# Patient Record
Sex: Male | Born: 1935
Health system: Southern US, Community
[De-identification: ages and names within clinical notes are randomized; demographics above are authoritative.]

## PROBLEM LIST (undated history)

## (undated) DIAGNOSIS — I82409 Acute embolism and thrombosis of unspecified deep veins of unspecified lower extremity: Secondary | ICD-10-CM

## (undated) DIAGNOSIS — K5790 Diverticulosis of intestine, part unspecified, without perforation or abscess without bleeding: Secondary | ICD-10-CM

## (undated) DIAGNOSIS — N400 Enlarged prostate without lower urinary tract symptoms: Secondary | ICD-10-CM

## (undated) DIAGNOSIS — D472 Monoclonal gammopathy: Secondary | ICD-10-CM

## (undated) DIAGNOSIS — D509 Iron deficiency anemia, unspecified: Secondary | ICD-10-CM

## (undated) DIAGNOSIS — I10 Essential (primary) hypertension: Secondary | ICD-10-CM

## (undated) DIAGNOSIS — Q613 Polycystic kidney, unspecified: Secondary | ICD-10-CM

## (undated) DIAGNOSIS — K219 Gastro-esophageal reflux disease without esophagitis: Secondary | ICD-10-CM

## (undated) DIAGNOSIS — K4090 Unilateral inguinal hernia, without obstruction or gangrene, not specified as recurrent: Secondary | ICD-10-CM

## (undated) HISTORY — PX: HAND SURGERY: SHX662

## (undated) HISTORY — DX: Diverticulosis of intestine, part unspecified, without perforation or abscess without bleeding: K57.90

## (undated) HISTORY — DX: Polycystic kidney, unspecified: Q61.3

## (undated) HISTORY — PX: LUNG SURGERY: SHX703

## (undated) HISTORY — DX: Iron deficiency anemia, unspecified: D50.9

## (undated) HISTORY — DX: Unilateral inguinal hernia, without obstruction or gangrene, not specified as recurrent: K40.90

## (undated) HISTORY — DX: Gastro-esophageal reflux disease without esophagitis: K21.9

## (undated) HISTORY — DX: Acute embolism and thrombosis of unspecified deep veins of unspecified lower extremity: I82.409

## (undated) HISTORY — DX: Benign prostatic hyperplasia without lower urinary tract symptoms: N40.0

## (undated) HISTORY — DX: Monoclonal gammopathy: D47.2

---

## 2004-11-28 ENCOUNTER — Ambulatory Visit: Payer: Self-pay | Admitting: Internal Medicine

## 2005-03-19 ENCOUNTER — Ambulatory Visit: Payer: Self-pay | Admitting: Internal Medicine

## 2005-12-11 ENCOUNTER — Ambulatory Visit: Payer: Self-pay | Admitting: Internal Medicine

## 2007-06-23 DIAGNOSIS — D509 Iron deficiency anemia, unspecified: Secondary | ICD-10-CM

## 2007-06-23 DIAGNOSIS — N400 Enlarged prostate without lower urinary tract symptoms: Secondary | ICD-10-CM

## 2007-06-23 DIAGNOSIS — K219 Gastro-esophageal reflux disease without esophagitis: Secondary | ICD-10-CM

## 2007-06-23 HISTORY — DX: Iron deficiency anemia, unspecified: D50.9

## 2007-06-23 HISTORY — DX: Benign prostatic hyperplasia without lower urinary tract symptoms: N40.0

## 2007-06-23 HISTORY — DX: Gastro-esophageal reflux disease without esophagitis: K21.9

## 2007-06-24 ENCOUNTER — Ambulatory Visit: Payer: Self-pay | Admitting: Internal Medicine

## 2007-09-03 ENCOUNTER — Ambulatory Visit: Payer: Self-pay | Admitting: Internal Medicine

## 2008-08-11 ENCOUNTER — Ambulatory Visit: Payer: Self-pay | Admitting: Internal Medicine

## 2009-06-15 ENCOUNTER — Emergency Department (HOSPITAL_BASED_OUTPATIENT_CLINIC_OR_DEPARTMENT_OTHER): Admission: EM | Admit: 2009-06-15 | Discharge: 2009-06-15 | Payer: Self-pay | Admitting: Emergency Medicine

## 2009-06-15 ENCOUNTER — Ambulatory Visit: Payer: Self-pay | Admitting: Diagnostic Radiology

## 2009-09-13 ENCOUNTER — Ambulatory Visit: Payer: Self-pay | Admitting: Internal Medicine

## 2009-11-09 ENCOUNTER — Ambulatory Visit: Payer: Self-pay | Admitting: Internal Medicine

## 2010-08-24 ENCOUNTER — Ambulatory Visit: Payer: Self-pay | Admitting: Internal Medicine

## 2010-12-02 LAB — CONVERTED CEMR LAB
Albumin: 3.3 g/dL — ABNORMAL LOW (ref 3.5–5.2)
BUN: 11 mg/dL (ref 6–23)
Basophils Relative: 0.7 % (ref 0.0–3.0)
Bilirubin, Direct: 0 mg/dL (ref 0.0–0.3)
Bilirubin, Direct: 0.1 mg/dL (ref 0.0–0.3)
CO2: 28 meq/L (ref 19–32)
Chloride: 106 meq/L (ref 96–112)
Cholesterol: 179 mg/dL (ref 0–200)
Creatinine, Ser: 1.4 mg/dL (ref 0.4–1.5)
Eosinophils Absolute: 0.1 10*3/uL (ref 0.0–0.6)
Eosinophils Absolute: 0.1 10*3/uL (ref 0.0–0.7)
Eosinophils Relative: 1.5 % (ref 0.0–5.0)
GFR calc Af Amer: 77 mL/min
GFR calc non Af Amer: 64 mL/min
Glucose, Bld: 95 mg/dL (ref 70–99)
HCT: 42.4 % (ref 39.0–52.0)
HCT: 43.2 % (ref 39.0–52.0)
Lymphocytes Relative: 22.6 % (ref 12.0–46.0)
Lymphs Abs: 1.2 10*3/uL (ref 0.7–4.0)
MCHC: 32.9 g/dL (ref 30.0–36.0)
MCV: 92.1 fL (ref 78.0–100.0)
MCV: 96.2 fL (ref 78.0–100.0)
Monocytes Absolute: 0.4 10*3/uL (ref 0.1–1.0)
Monocytes Absolute: 0.5 10*3/uL (ref 0.2–0.7)
Neutro Abs: 3.4 10*3/uL (ref 1.4–7.7)
Neutro Abs: 3.7 10*3/uL (ref 1.4–7.7)
Neutrophils Relative %: 65.5 % (ref 43.0–77.0)
Neutrophils Relative %: 67.1 % (ref 43.0–77.0)
PSA: 2.27 ng/mL (ref 0.10–4.00)
PSA: 2.78 ng/mL (ref 0.10–4.00)
Potassium: 4.5 meq/L (ref 3.5–5.1)
RBC: 4.49 M/uL (ref 4.22–5.81)
Sodium: 142 meq/L (ref 135–145)
TSH: 1.95 microintl units/mL (ref 0.35–5.50)
Total CHOL/HDL Ratio: 6
Total Protein: 7.4 g/dL (ref 6.0–8.3)
Triglycerides: 94 mg/dL (ref 0.0–149.0)
WBC: 5.6 10*3/uL (ref 4.5–10.5)

## 2010-12-04 NOTE — Assessment & Plan Note (Signed)
Summary: FLU SHOT/NJR  Nurse Visit   Allergies: 1)  ! Codeine Phosphate (Codeine Phosphate)  Review of Systems       Flu Vaccine Consent Questions     Do you have a history of severe allergic reactions to this vaccine? no    Any prior history of allergic reactions to egg and/or gelatin? no    Do you have a sensitivity to the preservative Thimersol? no    Do you have a past history of Guillan-Barre Syndrome? no    Do you currently have an acute febrile illness? no    Have you ever had a severe reaction to latex? no    Vaccine information given and explained to patient? yes    Are you currently pregnant? no    Lot Number:[id number]   Exp Date:05/04/2011   Site Given  Left Deltoid IM    Orders Added: 1)  Flu Vaccine 54yrs + MEDICARE PATIENTS [Q2039] 2)  Administration Flu vaccine - MCR [G0008]

## 2010-12-04 NOTE — Assessment & Plan Note (Signed)
Summary: emp/pt fasitng/cjr   Vital Signs:  Patient profile:   75 year old male Weight:      197 pounds BMI:     27.19 BP sitting:   148 / 84  (left arm) Cuff size:   regular  Vitals Entered By: Raechel Ache, RN (November 09, 2009 9:33 AM) CC: OV, fasting. Is Patient Diabetic? No   CC:  OV and fasting.Marland Kitchen  History of Present Illness: 75 year old patient who has a history of gastroesophageal reflux disease is seen today for a comprehensive evaluation.  He also has a history of BPH.  His brother has been diagnosed with prostate cancer.  In 2003.  He underwent GI evaluation for iron deficiency anemia.  He is doing quite well today.  He continues to work part-time.  Remains active and has no cardiopulmonary complaints.  His dyspepsia is well controlled with p.r.n. protonix  Problems Prior to Update: 1)  Benign Prostatic Hypertrophy  (ICD-600.00) 2)  Gerd  (ICD-530.81) 3)  Anemia-iron Deficiency  (ICD-280.9)  Medications Prior to Update: 1)  Protonix 40 Mg  Tbec (Pantoprazole Sodium) .Marland Kitchen.. 1 Once Daily  Allergies: 1)  ! Codeine Phosphate (Codeine Phosphate)  Past History:  Past Medical History: Reviewed history from 06/23/2007 and no changes required. Anemia-iron deficiency GERD Benign prostatic hypertrophy  Past Surgical History: Hand surgery 1974 history of spontaneous pneumothorax in 1957  2003 Colonoscopy and EGD   Family History: Reviewed history from 06/24/2007 and no changes required. father died att 53 from an MI.  Mother lived into her 42s one brother had a stroke , prostate ca  Social History: Reviewed history from 06/24/2007 and no changes required. 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    Physical Exam  General:  Well-developed,well-nourished,in no acute distress; alert,appropriate and cooperative throughout examination; 140/90 Head:  Normocephalic and atraumatic without obvious abnormalities. No apparent alopecia or balding. Eyes:  No corneal or conjunctival inflammation noted. EOMI. Perrla. Funduscopic exam benign, without hemorrhages, exudates or papilledema. Vision grossly normal. Ears:  External ear exam shows no significant lesions or deformities.  Otoscopic examination reveals clear canals, tympanic membranes are intact bilaterally without bulging, retraction, inflammation or discharge. Hearing is grossly normal bilaterally. Nose:  External nasal examination shows no deformity or inflammation. Nasal mucosa are pink and moist without lesions or exudates. Mouth:  Oral mucosa and oropharynx without lesions or exudates.  Teeth in good repair. Neck:  No deformities, masses, or tenderness noted. Chest Wall:  No deformities, masses, tenderness or gynecomastia noted. Breasts:  No masses or gynecomastia noted Lungs:  Normal respiratory effort, chest expands symmetrically. Lungs are clear to auscultation, no crackles or wheezes. Heart:  Normal rate and regular rhythm. S1 and S2 normal without gallop, murmur, click, rub or other extra sounds. Abdomen:  Bowel sounds positive,abdomen soft and non-tender without masses, organomegaly or hernias noted. Rectal:  No external abnormalities noted. Normal sphincter tone. No rectal masses or tenderness. Genitalia:  Testes bilaterally descended without nodularity, tenderness or masses. No scrotal masses or lesions. No penis lesions or urethral  discharge. Prostate:  3+ enlarged.  3+ enlarged.   Msk:  No deformity or scoliosis noted of thoracic or lumbar spine.   Pulses:  R and L carotid,radial,femoral,dorsalis pedis and posterior tibial pulses are full and equal bilaterally Extremities:  No clubbing, cyanosis, edema, or deformity noted with normal full range of motion of all joints.   Neurologic:  alert & oriented X3, cranial nerves II-XII intact, sensation intact to light touch, sensation intact to pinprick, gait normal, and DTRs symmetrical and normal.  alert & oriented X3, cranial nerves II-XII intact, sensation intact to light touch, sensation intact to pinprick, gait normal, and DTRs symmetrical and normal.   Skin:  Intact without suspicious lesions or rashes Cervical Nodes:  No lymphadenopathy noted Axillary Nodes:  No palpable lymphadenopathy Inguinal Nodes:  No significant adenopathy Psych:  Cognition and judgment appear intact. Alert and cooperative with normal attention span and concentration. No apparent delusions, illusions, hallucinations   Impression & Recommendations:  Problem # 1:  BENIGN PROSTATIC HYPERTROPHY (ICD-600.00)  Orders: Venipuncture (16109) TLB-Lipid Panel (80061-LIPID) TLB-BMP (Basic Metabolic Panel-BMET) (80048-METABOL) TLB-CBC Platelet - w/Differential (85025-CBCD) TLB-Hepatic/Liver Function Pnl (80076-HEPATIC) TLB-TSH (Thyroid Stimulating Hormone) (84443-TSH) TLB-PSA (Prostate Specific Antigen) (84153-PSA)  Problem # 2:  GERD (ICD-530.81)  His updated medication list for this problem includes:    Protonix 40 Mg Tbec (Pantoprazole sodium) .Marland Kitchen... 1 once daily    His updated medication list for this problem includes:    Protonix 40 Mg Tbec (Pantoprazole sodium) .Marland Kitchen... 1 once daily  Orders: Venipuncture (60454) TLB-Lipid Panel (80061-LIPID) TLB-BMP (Basic Metabolic Panel-BMET) (80048-METABOL) TLB-Hepatic/Liver Function Pnl (80076-HEPATIC) TLB-TSH (Thyroid Stimulating Hormone)  (84443-TSH)  Problem # 3:  ANEMIA-IRON DEFICIENCY (ICD-280.9)  Orders: Venipuncture (09811) TLB-Lipid Panel (80061-LIPID) TLB-BMP (Basic Metabolic Panel-BMET) (80048-METABOL) TLB-Hepatic/Liver Function Pnl (80076-HEPATIC) TLB-TSH (Thyroid Stimulating Hormone) (84443-TSH)  Complete Medication List: 1)  Protonix 40 Mg Tbec (Pantoprazole sodium) .Marland Kitchen.. 1 once daily  Other Orders: EKG w/ Interpretation (93000) Pneumococcal Vaccine (91478) Admin 1st Vaccine (29562) DRE (G0102)  Patient Instructions: 1)  Limit your Sodium (Salt) to less than 2 grams a day(slightly less than 1/2 a teaspoon) to prevent fluid retention, swelling, or worsening of symptoms. 2)  It is important that you exercise regularly at least 20 minutes 5 times a week. If you develop chest pain, have severe difficulty breathing, or feel very tired , stop exercising immediately and seek medical attention. 3)  Check your Blood Pressure regularly. If it is above: 140/90 you should make an appointment. Prescriptions: PROTONIX 40 MG  TBEC (PANTOPRAZOLE SODIUM) 1 once daily  #90 x 6   Entered and Authorized by:   Gordy Savers  MD   Signed by:   Gordy Savers  MD on 11/09/2009   Method used:   Print then Give to Patient   RxID:   [id number]    Immunizations Administered:  Pneumonia Vaccine:    Vaccine Type: Pneumovax    Site: left deltoid    Mfr: Merck    Dose: 0.5 ml    Route: IM    Given by: Raechel Ache, RN    Exp. Date: 10/14/2010    Lot #: [id number]    VIS given: 06/01/96 version given November 09, 2009.

## 2010-12-31 ENCOUNTER — Ambulatory Visit (INDEPENDENT_AMBULATORY_CARE_PROVIDER_SITE_OTHER): Payer: Medicare Other | Admitting: Internal Medicine

## 2010-12-31 ENCOUNTER — Encounter: Payer: Self-pay | Admitting: Internal Medicine

## 2010-12-31 DIAGNOSIS — J069 Acute upper respiratory infection, unspecified: Secondary | ICD-10-CM

## 2010-12-31 DIAGNOSIS — K219 Gastro-esophageal reflux disease without esophagitis: Secondary | ICD-10-CM

## 2010-12-31 MED ORDER — BENZONATATE 100 MG PO CAPS: 100.0000 mg | ORAL_CAPSULE | Freq: Four times a day (QID) | ORAL | Status: DC | PRN

## 2010-12-31 NOTE — Progress Notes (Signed)
  Subjective:    Patient ID: Alex Dunn, male    DOB: 26-Jun-1936, 75 y.o.   MRN: [id number]  HPI   75 year old patient who presents with a one-week history of chest congestion and cough he has had minimally productive cough that has been nonpurulent there's been no fever wheezing chest pain or shortness of breath. He also has had some sinus congestion. He has been taking Mucinex has failed to improve after 7 days. He does have a codeine allergy. He has had some borderline high blood pressure readings recently and blood pressure again is elevated today at   Review of Systems  Constitutional: Negative for fever, chills, appetite change and fatigue.  HENT: Positive for congestion. Negative for hearing loss, ear pain, sore throat, trouble swallowing, neck stiffness, dental problem, voice change and tinnitus.   Eyes: Negative for pain, discharge and visual disturbance.  Respiratory: Positive for cough. Negative for chest tightness, wheezing and stridor.   Cardiovascular: Negative for chest pain, palpitations and leg swelling.  Gastrointestinal: Negative for nausea, vomiting, abdominal pain, diarrhea, constipation, blood in stool and abdominal distention.  Genitourinary: Negative for urgency, hematuria, flank pain, discharge, difficulty urinating and genital sores.  Musculoskeletal: Negative for myalgias, back pain, joint swelling, arthralgias and gait problem.  Skin: Negative for rash.  Neurological: Negative for dizziness, syncope, speech difficulty, weakness, numbness and headaches.  Hematological: Negative for adenopathy. Does not bruise/bleed easily.  Psychiatric/Behavioral: Negative for behavioral problems and dysphoric mood. The patient is not nervous/anxious.        Objective:   Physical Exam  Constitutional: He is oriented to person, place, and time. He appears well-developed.  HENT:  Head: Normocephalic.  Right Ear: External ear normal.  Left Ear: External ear normal.  Eyes:  Conjunctivae and EOM are normal.  Neck: Normal range of motion.  Cardiovascular: Normal rate and normal heart sounds.   Pulmonary/Chest: Breath sounds normal.  Abdominal: Bowel sounds are normal.  Musculoskeletal: Normal range of motion. He exhibits no edema and no tenderness.  Neurological: He is alert and oriented to person, place, and time.  Psychiatric: He has a normal mood and affect. His behavior is normal.          Assessment & Plan:   viral URI  Rule out sustained hypertension   We'll treat symptomatically a new prescription for Tessalon Perles dispensed will continue Mucinex. Will recheck in 2 weeks to reassess his blood pressure

## 2010-12-31 NOTE — Patient Instructions (Signed)
Return in one month for follow-up  Please check your blood pressure on a regular basis.  If it is consistently greater than 150/90, please make an office appointment.  Get plenty of rest, Drink lots of  clear liquids, and use Tylenol or ibuprofen for fever and discomfort.

## 2011-01-11 ENCOUNTER — Emergency Department (HOSPITAL_BASED_OUTPATIENT_CLINIC_OR_DEPARTMENT_OTHER)
Admission: EM | Admit: 2011-01-11 | Discharge: 2011-01-11 | Disposition: A | Discharge status: Home / Self Care | Payer: Medicare Other | Attending: Emergency Medicine | Admitting: Emergency Medicine

## 2011-01-11 DIAGNOSIS — I10 Essential (primary) hypertension: Secondary | ICD-10-CM | POA: Insufficient documentation

## 2011-01-11 DIAGNOSIS — K219 Gastro-esophageal reflux disease without esophagitis: Secondary | ICD-10-CM | POA: Insufficient documentation

## 2011-01-16 ENCOUNTER — Encounter: Payer: Self-pay | Admitting: Internal Medicine

## 2011-01-17 ENCOUNTER — Ambulatory Visit (INDEPENDENT_AMBULATORY_CARE_PROVIDER_SITE_OTHER): Payer: Medicare Other | Admitting: Internal Medicine

## 2011-01-17 ENCOUNTER — Encounter: Payer: Self-pay | Admitting: Internal Medicine

## 2011-01-17 VITALS — BP 160/80 | Temp 98.1°F | Wt 194.0 lb

## 2011-01-17 DIAGNOSIS — I10 Essential (primary) hypertension: Secondary | ICD-10-CM | POA: Insufficient documentation

## 2011-01-17 MED ORDER — LISINOPRIL-HYDROCHLOROTHIAZIDE 20-12.5 MG PO TABS: 1.0000 | ORAL_TABLET | Freq: Every day | ORAL | Status: DC

## 2011-01-17 NOTE — Progress Notes (Signed)
  Subjective:    Patient ID: Alex Dunn, male    DOB: [birth year], 75 y.o.   MRN: [id number]  HPI   75 year old patient who was  Seen 2 weeks ago with a URI and noted to have some high blood pressure.  He has been tracking home blood pressure results and noted a systolic reading of 205. He was seen at the emergency room and noted to be hypertensive and was placed on diuretic therapy 7 days ago. He has tolerated this medication well. No concerns or complaints.  Review of Systems  Constitutional: Negative for fever, chills, appetite change and fatigue.  HENT: Negative for hearing loss, ear pain, congestion, sore throat, trouble swallowing, neck stiffness, dental problem, voice change and tinnitus.   Eyes: Negative for pain, discharge and visual disturbance.  Respiratory: Negative for cough, chest tightness, wheezing and stridor.   Cardiovascular: Negative for chest pain, palpitations and leg swelling.  Gastrointestinal: Negative for nausea, vomiting, abdominal pain, diarrhea, constipation, blood in stool and abdominal distention.  Genitourinary: Negative for urgency, hematuria, flank pain, discharge, difficulty urinating and genital sores.  Musculoskeletal: Negative for myalgias, back pain, joint swelling, arthralgias and gait problem.  Skin: Negative for rash.  Neurological: Negative for dizziness, syncope, speech difficulty, weakness, numbness and headaches.  Hematological: Negative for adenopathy. Does not bruise/bleed easily.  Psychiatric/Behavioral: Negative for behavioral problems and dysphoric mood. The patient is not nervous/anxious.        Objective:   Physical Exam  Constitutional: He appears well-developed and well-nourished. No distress.        Blood pressure 160/80  Cardiovascular: Regular rhythm and normal heart sounds.   Pulmonary/Chest: Effort normal and breath sounds normal.  Musculoskeletal: He exhibits no edema.          Assessment & Plan:   sustained  hypertension. We'll switch to a lisinopril hydrochlorothiazide combination. We'll recheck in one month.

## 2011-01-17 NOTE — Patient Instructions (Signed)
Please check your blood pressure on a regular basis.  If it is consistently greater than 150/90, please make an office appointment.  Limit your sodium (Salt) intake  Return in one month for follow-up

## 2011-01-18 ENCOUNTER — Telehealth: Payer: Self-pay | Admitting: *Deleted

## 2011-01-18 NOTE — Telephone Encounter (Signed)
(  con't) from last doc. - if BP drops , chills return , fever , n/v , TO ER. I will inform Dr. Amador Cunas to see what other suggestions he may have. KIK

## 2011-01-18 NOTE — Telephone Encounter (Signed)
Pt is calling because the patient is feeling very dizzy and his blood pressure is dropping.  [phone]

## 2011-01-18 NOTE — Telephone Encounter (Signed)
Spoke with wife , gave Dr. Vernon Prey instructions and ask her to call me Monday morning to give update on weekend and BP readings. KIK

## 2011-01-18 NOTE — Telephone Encounter (Signed)
Hold  all blood pressure medications until Monday. Saturday clinic in the morning  If unimproved

## 2011-01-18 NOTE — Telephone Encounter (Signed)
Spoke with pt - felt ok this AM , starting this afternoon - dizzy, achy , chills , blurred vision. BP 105/59 ,115/50, 119/60 , 124/68 (now) . States he feels ok , but still achy. I instructed him and wife that if BP drops again, chills retur

## 2011-01-22 ENCOUNTER — Telehealth: Payer: Self-pay | Admitting: Internal Medicine

## 2011-01-22 NOTE — Telephone Encounter (Signed)
Called pt - informed of Dr. Vernon Prey instructions - pt will resume - but he has hesitation due to he most recent experence . I asked him to monitor BP and call with any concerns

## 2011-01-22 NOTE — Telephone Encounter (Signed)
Pts called and said that pt bp was 175/97 this morning. Pt is leaving town on Thursday and is really concerned. Pt is req call back today from Dr Amador Cunas or nurse.

## 2011-01-22 NOTE — Telephone Encounter (Signed)
Have patient resume blood pressure medication and to followup next month and schedule

## 2011-01-22 NOTE — Telephone Encounter (Signed)
Called pt - we had stopped BP med last week when BP was running so low. BP ran 140-160/80-90 over the weekend. Still holding med. Now BP 175/97 and has plans to go out of town later this week. Please advise

## 2011-01-31 ENCOUNTER — Ambulatory Visit: Payer: Medicare Other | Admitting: Internal Medicine

## 2011-02-05 ENCOUNTER — Encounter: Payer: Self-pay | Admitting: Internal Medicine

## 2011-02-05 ENCOUNTER — Ambulatory Visit (INDEPENDENT_AMBULATORY_CARE_PROVIDER_SITE_OTHER): Payer: Medicare Other | Admitting: Internal Medicine

## 2011-02-05 VITALS — BP 130/72 | Temp 97.9°F | Wt 194.0 lb

## 2011-02-05 DIAGNOSIS — I10 Essential (primary) hypertension: Secondary | ICD-10-CM

## 2011-02-05 DIAGNOSIS — Z125 Encounter for screening for malignant neoplasm of prostate: Secondary | ICD-10-CM

## 2011-02-05 DIAGNOSIS — R5383 Other fatigue: Secondary | ICD-10-CM

## 2011-02-05 DIAGNOSIS — R5381 Other malaise: Secondary | ICD-10-CM

## 2011-02-05 LAB — CBC WITH DIFFERENTIAL/PLATELET
Basophils Relative: 0.6 % (ref 0.0–3.0)
Hemoglobin: 13.8 g/dL (ref 13.0–17.0)
Lymphocytes Relative: 22.7 % (ref 12.0–46.0)
Monocytes Relative: 8.8 % (ref 3.0–12.0)
Neutro Abs: 4.1 10*3/uL (ref 1.4–7.7)
Neutrophils Relative %: 66.3 % (ref 43.0–77.0)
RBC: 4.33 Mil/uL (ref 4.22–5.81)
WBC: 6.2 10*3/uL (ref 4.5–10.5)

## 2011-02-05 MED ORDER — AMLODIPINE BESYLATE 2.5 MG PO TABS: 2.5000 mg | ORAL_TABLET | Freq: Every day | ORAL | Status: DC

## 2011-02-05 NOTE — Patient Instructions (Signed)
Limit your sodium (Salt) intake  Please check your blood pressure on a regular basis.  If it is consistently greater than 150/90, please make an office appointment.  Return in one month for follow-up  

## 2011-02-05 NOTE — Progress Notes (Signed)
  Subjective:    Patient ID: Alex Dunn, male    DOB: 12-Mar-1936, 75 y.o.   MRN: [id number]  HPI  75 year old patient who is seen today for followup of hypertension. He was seen approximately 6 weeks ago and blood pressure was noted to be slightly elevated in the setting of a URI. He essentially seen in the ED because of a elevated systolic reading of 205 and placed on diuretic therapy at that time. He was seen here in followup and blood pressure was still in the 160/90 range. He was then placed on a combination of lisinopril hydrochlorothiazide. Since the combination therapy he has been quite weak. He has had some orthostatic symptoms including near syncope. Systolic blood pressure has been as low as 105. Because of these symptoms he has decreased his medication to one half tablet and presently one quarter tablet. Blood pressure readings have normalized but he still remains quite weak. No recent laboratory studies have been performed Review of Systems  Constitutional: Positive for fatigue. Negative for fever, chills and appetite change.  HENT: Negative for hearing loss, ear pain, congestion, sore throat, trouble swallowing, neck stiffness, dental problem, voice change and tinnitus.   Eyes: Negative for pain, discharge and visual disturbance.  Respiratory: Negative for cough, chest tightness, wheezing and stridor.   Cardiovascular: Negative for chest pain, palpitations and leg swelling.  Gastrointestinal: Negative for nausea, vomiting, abdominal pain, diarrhea, constipation, blood in stool and abdominal distention.  Genitourinary: Negative for urgency, hematuria, flank pain, discharge, difficulty urinating and genital sores.  Musculoskeletal: Negative for myalgias, back pain, joint swelling, arthralgias and gait problem.  Skin: Negative for rash.  Neurological: Positive for weakness. Negative for dizziness, syncope, speech difficulty, numbness and headaches.  Hematological: Negative for  adenopathy. Does not bruise/bleed easily.  Psychiatric/Behavioral: Negative for behavioral problems and dysphoric mood. The patient is not nervous/anxious.        Objective:   Physical Exam  Constitutional: He is oriented to person, place, and time. He appears well-developed and well-nourished. No distress.       Blood pressure 140/74  HENT:  Head: Normocephalic.  Right Ear: External ear normal.  Left Ear: External ear normal.  Eyes: Conjunctivae and EOM are normal.  Neck: Normal range of motion.  Cardiovascular: Normal rate and normal heart sounds.   Pulmonary/Chest: Breath sounds normal.  Abdominal: Bowel sounds are normal.  Musculoskeletal: Normal range of motion. He exhibits no edema and no tenderness.  Neurological: He is alert and oriented to person, place, and time.  Psychiatric: He has a normal mood and affect. His behavior is normal.          Assessment & Plan:  Fatigue and weakness. Blood pressure now appears to be normal but he remains symptomatic. Will check electrolytes and other labs and discontinue combination therapy;  Will place on amlodipine 2.5 mg daily. Recheck in 3 weeks

## 2011-02-06 LAB — HEPATIC FUNCTION PANEL
ALT: 13 U/L (ref 0–53)
Alkaline Phosphatase: 65 U/L (ref 39–117)

## 2011-02-06 LAB — LIPID PANEL
HDL: 35.3 mg/dL — ABNORMAL LOW (ref >39.00)
Total CHOL/HDL Ratio: 5
Triglycerides: 122 mg/dL (ref 0.0–149.0)
VLDL: 24.4 mg/dL (ref 0.0–40.0)

## 2011-02-06 LAB — PSA: PSA: 2.42 ng/mL (ref 0.10–4.00)

## 2011-02-06 LAB — BASIC METABOLIC PANEL
Calcium: 9.1 mg/dL (ref 8.4–10.5)
Creatinine, Ser: 1.4 mg/dL (ref 0.4–1.5)
GFR: 53.73 mL/min — ABNORMAL LOW (ref >60.00)
Sodium: 142 mEq/L (ref 135–145)

## 2011-02-10 LAB — URINALYSIS, ROUTINE W REFLEX MICROSCOPIC
Nitrite: NEGATIVE
Specific Gravity, Urine: 1.016 (ref 1.005–1.030)
Urobilinogen, UA: 0.2 mg/dL (ref 0.0–1.0)

## 2011-02-10 LAB — LIPASE, BLOOD: Lipase: 44 U/L (ref 23–300)

## 2011-02-10 LAB — COMPREHENSIVE METABOLIC PANEL
BUN: 17 mg/dL (ref 6–23)
CO2: 31 mEq/L (ref 19–32)
Chloride: 103 mEq/L (ref 96–112)
Creatinine, Ser: 1.4 mg/dL (ref 0.4–1.5)
GFR calc non Af Amer: 50 mL/min — ABNORMAL LOW (ref >60)
Total Bilirubin: 0.6 mg/dL (ref 0.3–1.2)

## 2011-02-10 LAB — CBC
HCT: 48 % (ref 39.0–52.0)
MCV: 94.1 fL (ref 78.0–100.0)
Platelets: 187 10*3/uL (ref 150–400)
WBC: 12.1 10*3/uL — ABNORMAL HIGH (ref 4.0–10.5)

## 2011-02-10 LAB — DIFFERENTIAL
Basophils Absolute: 0 10*3/uL (ref 0.0–0.1)
Lymphocytes Relative: 5 % — ABNORMAL LOW (ref 12–46)
Neutro Abs: 11.1 10*3/uL — ABNORMAL HIGH (ref 1.7–7.7)
Neutrophils Relative %: 92 % — ABNORMAL HIGH (ref 43–77)

## 2011-02-11 ENCOUNTER — Telehealth: Payer: Self-pay | Admitting: *Deleted

## 2011-02-11 NOTE — Telephone Encounter (Signed)
Lab results

## 2011-02-11 NOTE — Telephone Encounter (Signed)
All ok  Cholesterol 186

## 2011-02-12 NOTE — Telephone Encounter (Signed)
Notified pt of labs.

## 2011-02-18 ENCOUNTER — Ambulatory Visit: Payer: Medicare Other | Admitting: Internal Medicine

## 2011-03-05 ENCOUNTER — Encounter: Payer: Self-pay | Admitting: Internal Medicine

## 2011-03-05 ENCOUNTER — Ambulatory Visit (INDEPENDENT_AMBULATORY_CARE_PROVIDER_SITE_OTHER): Payer: Medicare Other | Admitting: Internal Medicine

## 2011-03-05 DIAGNOSIS — I10 Essential (primary) hypertension: Secondary | ICD-10-CM

## 2011-03-05 NOTE — Progress Notes (Signed)
  Subjective:    Patient ID: Alex Dunn, male    DOB: [birth year], 75 y.o.   MRN: [id number]  HPI 75 year old patient who is seen today for followup of hypertension . He was initially placed on diuretic therapy and this was up titrated to combination therapy with lisinopril. He became quite weak and side of the down titration of the medication. Due to tolerability issues he was switched to amlodipine and has been on a 2.5 mg dose. This is tolerated well with the normal home blood pressure readings. He does obtain readings with a wrist cuff and these have been quite labile. He is asymptomatic today.    Review of Systems  Constitutional: Negative for fever, chills, activity change, appetite change and fatigue.  HENT: Negative for hearing loss, ear pain, congestion, rhinorrhea, sneezing, mouth sores, trouble swallowing, neck pain, neck stiffness, dental problem, voice change, sinus pressure and tinnitus.   Eyes: Negative for photophobia, pain, redness and visual disturbance.  Respiratory: Negative for apnea, cough, choking, chest tightness, shortness of breath and wheezing.   Cardiovascular: Negative for chest pain, palpitations and leg swelling.  Gastrointestinal: Negative for nausea, vomiting, abdominal pain, diarrhea, constipation, blood in stool, abdominal distention, anal bleeding and rectal pain.  Genitourinary: Negative for dysuria, urgency, frequency, hematuria, flank pain, decreased urine volume, discharge, penile swelling, scrotal swelling, difficulty urinating, genital sores and testicular pain.  Musculoskeletal: Negative for myalgias, back pain, joint swelling, arthralgias and gait problem.  Skin: Negative for color change, rash and wound.  Neurological: Negative for dizziness, tremors, seizures, syncope, facial asymmetry, speech difficulty, weakness, light-headedness, numbness and headaches.  Hematological: Negative for adenopathy. Does not bruise/bleed easily.    Psychiatric/Behavioral: Negative for suicidal ideas, hallucinations, behavioral problems, confusion, sleep disturbance, self-injury, dysphoric mood, decreased concentration and agitation. The patient is not nervous/anxious.        Objective:   Physical Exam  Constitutional: He appears well-developed and well-nourished. No distress.       Blood pressure 140/80 in both arms   Musculoskeletal: He exhibits no edema.          Assessment & Plan:  Hypertension. Well controlled on amlodipine 2.5 mg daily. He has been asked to monitor blood pressure readings and consider obtaining a upper arm blood pressure cuff. Will recheck in 6 months. He'll call if blood pressure readings are consistently in excess of 150/90

## 2011-03-05 NOTE — Patient Instructions (Signed)
Limit your sodium (Salt) intake  Please check your blood pressure on a regular basis.  If it is consistently greater than 150/90, please make an office appointment.  Return in 6 months for follow-up   

## 2011-08-30 ENCOUNTER — Encounter: Payer: Self-pay | Admitting: Internal Medicine

## 2011-08-30 ENCOUNTER — Ambulatory Visit (INDEPENDENT_AMBULATORY_CARE_PROVIDER_SITE_OTHER): Payer: Medicare Other | Admitting: Internal Medicine

## 2011-08-30 VITALS — BP 140/80 | Temp 98.1°F | Wt 199.0 lb

## 2011-08-30 DIAGNOSIS — Z0000 Encounter for general adult medical examination without abnormal findings: Secondary | ICD-10-CM

## 2011-08-30 DIAGNOSIS — I10 Essential (primary) hypertension: Secondary | ICD-10-CM

## 2011-08-30 DIAGNOSIS — K219 Gastro-esophageal reflux disease without esophagitis: Secondary | ICD-10-CM

## 2011-08-30 DIAGNOSIS — Z23 Encounter for immunization: Secondary | ICD-10-CM

## 2011-08-30 MED ORDER — AMLODIPINE BESYLATE 2.5 MG PO TABS: 2.5000 mg | ORAL_TABLET | Freq: Every day | ORAL | Status: DC

## 2011-08-30 MED ORDER — PANTOPRAZOLE SODIUM 40 MG PO TBEC: 40.0000 mg | DELAYED_RELEASE_TABLET | ORAL | Status: DC | PRN

## 2011-08-30 NOTE — Progress Notes (Signed)
  Subjective:    Patient ID: Alex Dunn, male    DOB: Jul 27, 1936, 75 y.o.   MRN: [id number]  HPI  28 a 78-year-old patient who is in today for followup of hypertension. He remains on Protonix for GERD which has been stable. He has been on amlodipine 2.5 mg daily which he tolerates well he does monitor home blood pressure readings with systolics usually in the 130s and diastolics well controlled no concerns or complaints today    Review of Systems  Constitutional: Negative for fever, chills, appetite change and fatigue.  HENT: Negative for hearing loss, ear pain, congestion, sore throat, trouble swallowing, neck stiffness, dental problem, voice change and tinnitus.   Eyes: Negative for pain, discharge and visual disturbance.  Respiratory: Negative for cough, chest tightness, wheezing and stridor.   Cardiovascular: Negative for chest pain, palpitations and leg swelling.  Gastrointestinal: Negative for nausea, vomiting, abdominal pain, diarrhea, constipation, blood in stool and abdominal distention.  Genitourinary: Negative for urgency, hematuria, flank pain, discharge, difficulty urinating and genital sores.  Musculoskeletal: Negative for myalgias, back pain, joint swelling, arthralgias and gait problem.  Skin: Negative for rash.  Neurological: Negative for dizziness, syncope, speech difficulty, weakness, numbness and headaches.  Hematological: Negative for adenopathy. Does not bruise/bleed easily.  Psychiatric/Behavioral: Negative for behavioral problems and dysphoric mood. The patient is not nervous/anxious.        Objective:   Physical Exam  Constitutional: He is oriented to person, place, and time. He appears well-developed.  HENT:  Head: Normocephalic.  Right Ear: External ear normal.  Left Ear: External ear normal.  Eyes: Conjunctivae and EOM are normal.  Neck: Normal range of motion.  Cardiovascular: Normal rate and normal heart sounds.   Pulmonary/Chest: Breath sounds  normal.  Abdominal: Bowel sounds are normal.  Musculoskeletal: Normal range of motion. He exhibits no edema and no tenderness.  Neurological: He is alert and oriented to person, place, and time.  Psychiatric: He has a normal mood and affect. His behavior is normal.          Assessment & Plan:    Hypertension stable. We'll continue amlodipine 2.5 mg daily we'll refill Gastroesophageal reflux disease stable antireflux regimen encouraged  Low-salt diet recommended recheck in 6 months for CPX

## 2011-08-30 NOTE — Patient Instructions (Signed)
Limit your sodium (Salt) intake  Please check your blood pressure on a regular basis.  If it is consistently greater than 150/90, please make an office appointment.    It is important that you exercise regularly, at least 20 minutes 3 to 4 times per week.  If you develop chest pain or shortness of breath seek  medical attention.  Return in 6 months for follow-up  

## 2011-09-04 ENCOUNTER — Ambulatory Visit: Payer: Medicare Other | Admitting: Internal Medicine

## 2012-02-28 ENCOUNTER — Encounter: Payer: Self-pay | Admitting: Internal Medicine

## 2012-02-28 ENCOUNTER — Ambulatory Visit (INDEPENDENT_AMBULATORY_CARE_PROVIDER_SITE_OTHER): Payer: Medicare Other | Admitting: Internal Medicine

## 2012-02-28 VITALS — BP 120/68 | Temp 97.9°F | Wt 199.0 lb

## 2012-02-28 DIAGNOSIS — D509 Iron deficiency anemia, unspecified: Secondary | ICD-10-CM

## 2012-02-28 DIAGNOSIS — I10 Essential (primary) hypertension: Secondary | ICD-10-CM

## 2012-02-28 DIAGNOSIS — R5381 Other malaise: Secondary | ICD-10-CM

## 2012-02-28 DIAGNOSIS — K219 Gastro-esophageal reflux disease without esophagitis: Secondary | ICD-10-CM

## 2012-02-28 DIAGNOSIS — R5383 Other fatigue: Secondary | ICD-10-CM

## 2012-02-28 DIAGNOSIS — E785 Hyperlipidemia, unspecified: Secondary | ICD-10-CM

## 2012-02-28 DIAGNOSIS — N400 Enlarged prostate without lower urinary tract symptoms: Secondary | ICD-10-CM

## 2012-02-28 DIAGNOSIS — Z0000 Encounter for general adult medical examination without abnormal findings: Secondary | ICD-10-CM

## 2012-02-28 LAB — COMPREHENSIVE METABOLIC PANEL
AST: 16 U/L (ref 0–37)
Albumin: 3.5 g/dL (ref 3.5–5.2)
BUN: 18 mg/dL (ref 6–23)
Calcium: 8.9 mg/dL (ref 8.4–10.5)
Chloride: 104 mEq/L (ref 96–112)
Glucose, Bld: 85 mg/dL (ref 70–99)
Potassium: 4.8 mEq/L (ref 3.5–5.1)
Sodium: 141 mEq/L (ref 135–145)
Total Protein: 6.9 g/dL (ref 6.0–8.3)

## 2012-02-28 LAB — TSH: TSH: 2.61 u[IU]/mL (ref 0.35–5.50)

## 2012-02-28 LAB — LIPID PANEL
Cholesterol: 183 mg/dL (ref 0–200)
LDL Cholesterol: 122 mg/dL — ABNORMAL HIGH (ref 0–99)

## 2012-02-28 LAB — CBC WITH DIFFERENTIAL/PLATELET
Basophils Absolute: 0 10*3/uL (ref 0.0–0.1)
Basophils Relative: 0.8 % (ref 0.0–3.0)
Eosinophils Absolute: 0.1 10*3/uL (ref 0.0–0.7)
Lymphocytes Relative: 25.2 % (ref 12.0–46.0)
MCHC: 33.3 g/dL (ref 30.0–36.0)
Monocytes Absolute: 0.5 10*3/uL (ref 0.1–1.0)
Neutrophils Relative %: 63.2 % (ref 43.0–77.0)
Platelets: 189 10*3/uL (ref 150.0–400.0)
RBC: 4.5 Mil/uL (ref 4.22–5.81)

## 2012-02-28 MED ORDER — AMLODIPINE BESYLATE 5 MG PO TABS: 5.0000 mg | ORAL_TABLET | Freq: Every day | ORAL | Status: DC

## 2012-02-28 MED ORDER — PANTOPRAZOLE SODIUM 40 MG PO TBEC: 40.0000 mg | DELAYED_RELEASE_TABLET | ORAL | Status: DC | PRN

## 2012-02-28 NOTE — Patient Instructions (Signed)
Limit your sodium (Salt) intake  Please check your blood pressure on a regular basis.  If it is consistently greater than 150/90, please make an office appointment.  Return in 6 months for follow-up   

## 2012-02-28 NOTE — Progress Notes (Signed)
  Subjective:    Patient ID: Alex Dunn, male    DOB: Jun 30, 1936, 76 y.o.   MRN: [id number]  HPI 76 year old patient who is seen today for followup. He has a history of treated hypertension and has been on amlodipine 2.5 mg daily. More recently he has had the elevated systolic readings in the 160s. Otherwise feels generally well. He does describe some early-morning fatigue that resolved after breakfast. He does have a history of iron deficiency anemia. He also has gastroesophageal reflux disease which has been stable. He takes PPI therapy on approximately 2 times per week. No other concerns or complaints. He was seen 6 months ago for his annual exam. It has been one year since laboratory studies have been performed    Review of Systems  Constitutional: Positive for fatigue. Negative for fever, chills and appetite change.  HENT: Negative for hearing loss, ear pain, congestion, sore throat, trouble swallowing, neck stiffness, dental problem, voice change and tinnitus.   Eyes: Negative for pain, discharge and visual disturbance.  Respiratory: Negative for cough, chest tightness, wheezing and stridor.   Cardiovascular: Negative for chest pain, palpitations and leg swelling.  Gastrointestinal: Negative for nausea, vomiting, abdominal pain, diarrhea, constipation, blood in stool and abdominal distention.  Genitourinary: Negative for urgency, hematuria, flank pain, discharge, difficulty urinating and genital sores.  Musculoskeletal: Negative for myalgias, back pain, joint swelling, arthralgias and gait problem.  Skin: Negative for rash.  Neurological: Negative for dizziness, syncope, speech difficulty, weakness, numbness and headaches.  Hematological: Negative for adenopathy. Does not bruise/bleed easily.  Psychiatric/Behavioral: Negative for behavioral problems and dysphoric mood. The patient is not nervous/anxious.        Objective:   Physical Exam  Constitutional: He is oriented to person,  place, and time. He appears well-developed.       Blood pressure 140/80  HENT:  Head: Normocephalic.  Right Ear: External ear normal.  Left Ear: External ear normal.  Eyes: Conjunctivae and EOM are normal.  Neck: Normal range of motion.  Cardiovascular: Normal rate and normal heart sounds.   Pulmonary/Chest: Breath sounds normal.  Abdominal: Bowel sounds are normal.  Musculoskeletal: Normal range of motion. He exhibits no edema and no tenderness.  Neurological: He is alert and oriented to person, place, and time.  Psychiatric: He has a normal mood and affect. His behavior is normal.          Assessment & Plan:   Hypertension. We'll increase amlodipine to 5 mg daily. Systolic blood pressure readings have been trending up. Low salt diet exercise encouraged Gastroesophageal reflux disease. Medications refilled Fatigue. We'll check up on her lab has been one year  Recheck 6 months Continued home blood pressure monitor and encouraged

## 2012-08-21 ENCOUNTER — Other Ambulatory Visit (INDEPENDENT_AMBULATORY_CARE_PROVIDER_SITE_OTHER): Payer: Medicare Other

## 2012-08-21 DIAGNOSIS — I10 Essential (primary) hypertension: Secondary | ICD-10-CM

## 2012-08-21 DIAGNOSIS — Z0000 Encounter for general adult medical examination without abnormal findings: Secondary | ICD-10-CM

## 2012-08-21 DIAGNOSIS — Z125 Encounter for screening for malignant neoplasm of prostate: Secondary | ICD-10-CM

## 2012-08-21 LAB — PSA: PSA: 2.65 ng/mL (ref 0.10–4.00)

## 2012-08-21 LAB — CBC WITH DIFFERENTIAL/PLATELET
Basophils Relative: 0.5 % (ref 0.0–3.0)
Eosinophils Relative: 1.9 % (ref 0.0–5.0)
HCT: 42.1 % (ref 39.0–52.0)
Hemoglobin: 13.8 g/dL (ref 13.0–17.0)
Lymphs Abs: 1.8 10*3/uL (ref 0.7–4.0)
MCV: 94.8 fl (ref 78.0–100.0)
Monocytes Absolute: 0.6 10*3/uL (ref 0.1–1.0)
Monocytes Relative: 9.2 % (ref 3.0–12.0)
RBC: 4.44 Mil/uL (ref 4.22–5.81)
WBC: 6.2 10*3/uL (ref 4.5–10.5)

## 2012-08-21 LAB — LIPID PANEL
HDL: 35 mg/dL — ABNORMAL LOW (ref >39.00)
LDL Cholesterol: 128 mg/dL — ABNORMAL HIGH (ref 0–99)
Total CHOL/HDL Ratio: 5
Triglycerides: 110 mg/dL (ref 0.0–149.0)

## 2012-08-21 LAB — HEPATIC FUNCTION PANEL
ALT: 14 U/L (ref 0–53)
AST: 15 U/L (ref 0–37)
Total Bilirubin: 0.4 mg/dL (ref 0.3–1.2)
Total Protein: 6.9 g/dL (ref 6.0–8.3)

## 2012-08-21 LAB — BASIC METABOLIC PANEL
BUN: 19 mg/dL (ref 6–23)
Chloride: 106 mEq/L (ref 96–112)
GFR: 50.13 mL/min — ABNORMAL LOW (ref >60.00)
Potassium: 5 mEq/L (ref 3.5–5.1)
Sodium: 142 mEq/L (ref 135–145)

## 2012-08-21 LAB — POCT URINALYSIS DIPSTICK
Bilirubin, UA: NEGATIVE
Blood, UA: NEGATIVE
Glucose, UA: NEGATIVE
Nitrite, UA: NEGATIVE
Spec Grav, UA: 1.015
Urobilinogen, UA: 0.2
pH, UA: 7.5

## 2012-08-21 LAB — TSH: TSH: 3.19 u[IU]/mL (ref 0.35–5.50)

## 2012-08-28 ENCOUNTER — Ambulatory Visit: Payer: Medicare Other | Admitting: Internal Medicine

## 2012-08-28 ENCOUNTER — Encounter: Payer: Medicare Other | Admitting: Internal Medicine

## 2012-09-01 ENCOUNTER — Ambulatory Visit (INDEPENDENT_AMBULATORY_CARE_PROVIDER_SITE_OTHER): Payer: Medicare Other | Admitting: Internal Medicine

## 2012-09-01 ENCOUNTER — Encounter: Payer: Self-pay | Admitting: Internal Medicine

## 2012-09-01 ENCOUNTER — Ambulatory Visit (INDEPENDENT_AMBULATORY_CARE_PROVIDER_SITE_OTHER)
Admission: RE | Admit: 2012-09-01 | Discharge: 2012-09-01 | Disposition: A | Discharge status: Home / Self Care | Payer: Medicare Other | Source: Ambulatory Visit | Attending: Internal Medicine | Admitting: Internal Medicine

## 2012-09-01 VITALS — BP 118/70 | HR 70 | Temp 97.9°F | Resp 18 | Ht 72.0 in | Wt 202.0 lb

## 2012-09-01 DIAGNOSIS — I10 Essential (primary) hypertension: Secondary | ICD-10-CM

## 2012-09-01 DIAGNOSIS — K219 Gastro-esophageal reflux disease without esophagitis: Secondary | ICD-10-CM

## 2012-09-01 DIAGNOSIS — Z77090 Contact with and (suspected) exposure to asbestos: Secondary | ICD-10-CM

## 2012-09-01 DIAGNOSIS — Z0000 Encounter for general adult medical examination without abnormal findings: Secondary | ICD-10-CM

## 2012-09-01 DIAGNOSIS — Z23 Encounter for immunization: Secondary | ICD-10-CM

## 2012-09-01 MED ORDER — AMLODIPINE BESYLATE 5 MG PO TABS: 5.0000 mg | ORAL_TABLET | Freq: Every day | ORAL | Status: DC

## 2012-09-01 MED ORDER — PANTOPRAZOLE SODIUM 40 MG PO TBEC: 40.0000 mg | DELAYED_RELEASE_TABLET | ORAL | Status: DC | PRN

## 2012-09-01 NOTE — Patient Instructions (Addendum)
  Limit your sodium (Salt) intake    It is important that you exercise regularly, at least 20 minutes 3 to 4 times per week.  If you develop chest pain or shortness of breath seek  medical attention.  Please check your blood pressure on a regular basis.  If it is consistently greater than 150/90, please make an office appointment.  Colonoscopy  Return in one year for follow-up   Chest x-ray as discussed

## 2012-09-01 NOTE — Progress Notes (Signed)
Quick Note:  Attempt to call- VM at hm# - LMTCB if questions - results normal ______

## 2012-09-01 NOTE — Progress Notes (Signed)
Patient ID: Alex Dunn, male   DOB: 11/03/1936, 76 y.o.   MRN: [id number]  Subjective:    Patient ID: Alex Dunn, male    DOB: 08-31-1936, 76 y.o.   MRN: [id number]  Hypertension Pertinent negatives include no chest pain, headaches or palpitations.   76 year-old patient who is in today for followup of hypertension and for a preventive health examination. He remains on Protonix for GERD which has been stable. He has been on amlodipine 5 mg daily which he tolerates well;  for the past few weeks however he has had some mild ankle edema;  he does monitor home blood pressure readings with systolics usually in the 130s and diastolics well controlled no concerns or complaints today.  Past Medical History  Diagnosis Date  . GERD 06/23/2007  . BENIGN PROSTATIC HYPERTROPHY 06/23/2007  . ANEMIA-IRON DEFICIENCY 06/23/2007    History   Social History  . Marital Status: Married    Spouse Name: N/A    Number of Children: N/A  . Years of Education: N/A   Occupational History  . Not on file.   Social History Main Topics  . Smoking status: Never Smoker   . Smokeless tobacco: Never Used  . Alcohol Use: No  . Drug Use: No  . Sexually Active: Not on file   Other Topics Concern  . Not on file   Social History Narrative  . No narrative on file    Past Surgical History  Procedure Date  . Hand surgery     Family History  Problem Relation Age of Onset  . Stroke Brother   . Cancer Brother     prostate ca    Allergies  Allergen Reactions  . Codeine Phosphate     Current Outpatient Prescriptions on File Prior to Visit  Medication Sig Dispense Refill  . amLODipine (NORVASC) 5 MG tablet Take 1 tablet (5 mg total) by mouth daily.  90 tablet  6  . pantoprazole (PROTONIX) 40 MG tablet Take 1 tablet (40 mg total) by mouth as needed.  90 tablet  6    BP 118/70  Pulse 70  Temp 97.9 F (36.6 C) (Oral)  Resp 18  Ht 6' (1.829 m)  Wt 202 lb (91.627 kg)  BMI 27.40 kg/m2  SpO2  96%   He was evaluated for iron deficiency anemia in 2003 no colonoscopy since that time.  Current Allergies:  ! CODEINE PHOSPHATE (CODEINE PHOSPHATE)   Past Surgical History:  Hand surgery 1994  history of spontaneous pneumothorax in 1957   Family History:  father died att 92 from an MI. 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 Risk factors, based on past  M,S,F history-  cardiovascular risk factors include family history of heart disease and hypertension  2.  Physical activities: Fairly sedentary but remains active. No regular exercise program  3.  Depression/mood: No history depression or mood disorder  4.  Hearing: No deficits  5.  ADL's: Independent in all aspects of daily living. Still works part-time for [employer]  6.  Fall risk: Low  7.  Home safety: No problems identified  8.  Height weight, and visual acuity; height and weight stable no change in visual acuity  9.  Counseling: Colonoscopy will regular exercise encouraged  10. Lab orders based on risk factors: Laboratory screen discussed  11. Referral : Colonoscopy  12. Care plan: Colonoscopy more regular exercise heart healthy diet all encouraged  13. Cognitive assessment: Alert and oriented with normal affect. No cognitive dysfunction.      Review of Systems  Constitutional: Negative for fever, chills, appetite change and fatigue.  HENT: Negative for hearing loss, ear pain, congestion, sore throat, trouble swallowing, neck stiffness, dental problem, voice change and tinnitus.   Eyes: Negative for pain, discharge and visual disturbance.  Respiratory: Negative for cough, chest tightness, wheezing and stridor.   Cardiovascular: Negative for chest pain, palpitations and leg swelling.  Gastrointestinal: Negative for nausea, vomiting, abdominal pain, diarrhea, constipation, blood in stool and abdominal  distention.  Genitourinary: Negative for urgency, hematuria, flank pain, discharge, difficulty urinating and genital sores.  Musculoskeletal: Negative for myalgias, back pain, joint swelling, arthralgias and gait problem.  Skin: Negative for rash.  Neurological: Negative for dizziness, syncope, speech difficulty, weakness, numbness and headaches.  Hematological: Negative for adenopathy. Does not bruise/bleed easily.  Psychiatric/Behavioral: Negative for behavioral problems and dysphoric mood. The patient is not nervous/anxious.        Objective:   Physical Exam  Constitutional: He is oriented to person, place, and time. He appears well-developed.  HENT:  Head: Normocephalic.  Right Ear: External ear normal.  Left Ear: External ear normal.  Eyes: Conjunctivae normal and EOM are normal.  Neck: Normal range of motion.  Cardiovascular: Normal rate and normal heart sounds.   Pulmonary/Chest: Breath sounds normal.  Abdominal: Bowel sounds are normal.  Musculoskeletal: Normal range of motion. He exhibits no edema and no tenderness.  Neurological: He is alert and oriented to person, place, and time.  Psychiatric: He has a normal mood and affect. His behavior is normal.          Assessment & Plan:    Hypertension stable. We'll continue amlodipine 2.5 mg daily we'll refill Gastroesophageal reflux disease stable antireflux regimen encouraged  Low-salt diet recommended recheck in 6 months for CPX

## 2012-09-28 ENCOUNTER — Telehealth: Payer: Self-pay | Admitting: Internal Medicine

## 2012-09-28 MED ORDER — AMLODIPINE BESYLATE 5 MG PO TABS: 5.0000 mg | ORAL_TABLET | Freq: Every day | ORAL | Status: DC

## 2012-09-28 MED ORDER — PANTOPRAZOLE SODIUM 40 MG PO TBEC: 40.0000 mg | DELAYED_RELEASE_TABLET | ORAL | Status: DC | PRN

## 2012-09-28 NOTE — Telephone Encounter (Signed)
rx's sent in electronically 

## 2012-09-28 NOTE — Telephone Encounter (Signed)
Pt says scripts are too expensive if he uses local pharmacy.Pt is req to change to [facility] pharmacy. Pls send  90 day scripts for amLODipine (NORVASC) 5 MG tablet and   pantoprazole (PROTONIX) 40 MG tablet to [facility] order Phone # [phone] and the       Fax # [phone]

## 2013-08-26 ENCOUNTER — Other Ambulatory Visit (INDEPENDENT_AMBULATORY_CARE_PROVIDER_SITE_OTHER): Payer: Medicare Other

## 2013-08-26 DIAGNOSIS — I10 Essential (primary) hypertension: Secondary | ICD-10-CM

## 2013-08-26 DIAGNOSIS — Z0000 Encounter for general adult medical examination without abnormal findings: Secondary | ICD-10-CM

## 2013-08-26 LAB — POCT URINALYSIS DIPSTICK
Blood, UA: NEGATIVE
Glucose, UA: NEGATIVE
Leukocytes, UA: NEGATIVE
Nitrite, UA: NEGATIVE
Urobilinogen, UA: 0.2

## 2013-08-26 LAB — CBC WITH DIFFERENTIAL/PLATELET
Basophils Absolute: 0 10*3/uL (ref 0.0–0.1)
Eosinophils Absolute: 0.1 10*3/uL (ref 0.0–0.7)
HCT: 42.5 % (ref 39.0–52.0)
Lymphs Abs: 2 10*3/uL (ref 0.7–4.0)
MCV: 91.4 fl (ref 78.0–100.0)
Monocytes Absolute: 0.6 10*3/uL (ref 0.1–1.0)
Neutrophils Relative %: 57.7 % (ref 43.0–77.0)
Platelets: 201 10*3/uL (ref 150.0–400.0)
RDW: 13.5 % (ref 11.5–14.6)

## 2013-08-26 LAB — LIPID PANEL
Total CHOL/HDL Ratio: 5
Triglycerides: 117 mg/dL (ref 0.0–149.0)

## 2013-08-26 LAB — HEPATIC FUNCTION PANEL
Bilirubin, Direct: 0.1 mg/dL (ref 0.0–0.3)
Total Bilirubin: 0.7 mg/dL (ref 0.3–1.2)

## 2013-08-26 LAB — BASIC METABOLIC PANEL
BUN: 22 mg/dL (ref 6–23)
Chloride: 105 mEq/L (ref 96–112)
Creatinine, Ser: 1.4 mg/dL (ref 0.4–1.5)
Glucose, Bld: 82 mg/dL (ref 70–99)
Potassium: 4.5 mEq/L (ref 3.5–5.1)

## 2013-08-26 LAB — TSH: TSH: 2.89 u[IU]/mL (ref 0.35–5.50)

## 2013-08-26 LAB — PSA: PSA: 2.81 ng/mL (ref 0.10–4.00)

## 2013-09-02 ENCOUNTER — Ambulatory Visit (INDEPENDENT_AMBULATORY_CARE_PROVIDER_SITE_OTHER): Payer: Medicare Other | Admitting: Internal Medicine

## 2013-09-02 ENCOUNTER — Encounter: Payer: Self-pay | Admitting: Internal Medicine

## 2013-09-02 VITALS — BP 142/76 | HR 77 | Temp 97.8°F | Resp 20 | Ht 71.5 in | Wt 205.0 lb

## 2013-09-02 DIAGNOSIS — K219 Gastro-esophageal reflux disease without esophagitis: Secondary | ICD-10-CM

## 2013-09-02 DIAGNOSIS — D509 Iron deficiency anemia, unspecified: Secondary | ICD-10-CM

## 2013-09-02 DIAGNOSIS — Z23 Encounter for immunization: Secondary | ICD-10-CM

## 2013-09-02 DIAGNOSIS — I10 Essential (primary) hypertension: Secondary | ICD-10-CM

## 2013-09-02 DIAGNOSIS — Z0000 Encounter for general adult medical examination without abnormal findings: Secondary | ICD-10-CM

## 2013-09-02 DIAGNOSIS — N400 Enlarged prostate without lower urinary tract symptoms: Secondary | ICD-10-CM

## 2013-09-02 MED ORDER — PANTOPRAZOLE SODIUM 40 MG PO TBEC: 40.0000 mg | DELAYED_RELEASE_TABLET | Freq: Every day | ORAL | Status: DC | PRN

## 2013-09-02 MED ORDER — LOSARTAN POTASSIUM-HCTZ 100-25 MG PO TABS: 1.0000 | ORAL_TABLET | Freq: Every day | ORAL | Status: DC

## 2013-09-02 MED ORDER — AMLODIPINE BESYLATE 5 MG PO TABS: 5.0000 mg | ORAL_TABLET | Freq: Every day | ORAL | Status: DC

## 2013-09-02 NOTE — Progress Notes (Signed)
Patient ID: Alex Dunn, male   DOB: [birth year], 77 y.o.   MRN: [id number]  Subjective:    Patient ID: Alex Dunn, male    DOB: 12/12/1935, 77 y.o.   MRN: [id number]  Hypertension Pertinent negatives include no chest pain, headaches or palpitations.  77 year-old patient who is in today for followup of hypertension and for a preventive health examination. He remains on Protonix for GERD which has been stable. He has been on amlodipine 5 mg daily which he tolerates well; he continues to have pedal edema with amlodipine.  he does monitor home blood pressure readings with systolics usually in the 130s and diastolics well controlled no concerns or complaints today.  Past Medical History  Diagnosis Date  . GERD 06/23/2007  . BENIGN PROSTATIC HYPERTROPHY 06/23/2007  . ANEMIA-IRON DEFICIENCY 06/23/2007    History   Social History  . Marital Status: Married    Spouse Name: N/A    Number of Children: N/A  . Years of Education: N/A   Occupational History  . Not on file.   Social History Main Topics  . Smoking status: Never Smoker   . Smokeless tobacco: Never Used  . Alcohol Use: No  . Drug Use: No  . Sexual Activity: Not on file   Other Topics Concern  . Not on file   Social History Narrative  . No narrative on file    Past Surgical History  Procedure Laterality Date  . Hand surgery      Family History  Problem Relation Age of Onset  . Stroke Brother   . Cancer Brother     prostate ca    Allergies  Allergen Reactions  . Codeine Phosphate     No current outpatient prescriptions on file prior to visit.   No current facility-administered medications on file prior to visit.    BP 142/76  Pulse 77  Temp(Src) 97.8 F (36.6 C) (Oral)  Resp 20  Ht 5' 11.5" (1.816 m)  Wt 205 lb (92.987 kg)  BMI 28.2 kg/m2  SpO2 98%   He was evaluated for iron deficiency anemia in 2003 no colonoscopy since that time.  Current Allergies:  ! CODEINE PHOSPHATE (CODEINE  PHOSPHATE)   Past Surgical History:  Hand surgery 1994  history of spontaneous pneumothorax in 1957   Family History:  father died att 43 from an MI. 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 Risk factors, based on past  M,S,F history-  cardiovascular risk factors include family history of heart disease and hypertension  2.  Physical activities: Fairly sedentary but remains active. No regular exercise program  3.  Depression/mood: No history depression or mood disorder  4.  Hearing: No deficits  5.  ADL's: Independent in all aspects of daily living. Still works part-time for [employer]  6.  Fall risk: Low  7.  Home safety: No problems identified  8.  Height weight, and visual acuity; height and weight stable no change in visual acuity  9.  Counseling: Colonoscopy will regular exercise encouraged  10. Lab orders based on risk factors: Laboratory screen discussed  11. Referral : Colonoscopy 12. Care plan: Colonoscopy more regular exercise heart healthy diet all encouraged  13. Cognitive assessment: Alert and oriented with normal affect. No cognitive dysfunction.      Review of Systems  Constitutional: Negative for fever, chills, appetite  change and fatigue.  HENT: Negative for congestion, dental problem, ear pain, hearing loss, sore throat, tinnitus, trouble swallowing and voice change.   Eyes: Negative for pain, discharge and visual disturbance.  Respiratory: Negative for cough, chest tightness, wheezing and stridor.   Cardiovascular: Negative for chest pain, palpitations and leg swelling.  Gastrointestinal: Negative for nausea, vomiting, abdominal pain, diarrhea, constipation, blood in stool and abdominal distention.  Genitourinary: Negative for urgency, hematuria, flank pain, discharge, difficulty urinating and genital sores.  Musculoskeletal: Negative for arthralgias,  back pain, gait problem, joint swelling, myalgias and neck stiffness.  Skin: Negative for rash.  Neurological: Negative for dizziness, syncope, speech difficulty, weakness, numbness and headaches.  Hematological: Negative for adenopathy. Does not bruise/bleed easily.  Psychiatric/Behavioral: Negative for behavioral problems and dysphoric mood. The patient is not nervous/anxious.        Objective:   Physical Exam  Constitutional: He is oriented to person, place, and time. He appears well-developed.  HENT:  Head: Normocephalic.  Right Ear: External ear normal.  Left Ear: External ear normal.  Eyes: Conjunctivae and EOM are normal.  Neck: Normal range of motion.  Cardiovascular: Normal rate and normal heart sounds.   Pulmonary/Chest: Breath sounds normal.  Abdominal: Bowel sounds are normal.  Musculoskeletal: Normal range of motion. He exhibits no edema and no tenderness.  Neurological: He is alert and oriented to person, place, and time.  Psychiatric: He has a normal mood and affect. His behavior is normal.          Assessment & Plan:    Hypertension stable. We'll continue amlodipine 2.5 mg daily we'll refill Gastroesophageal reflux disease stable antireflux regimen encouraged  Low-salt diet recommended recheck in 6 months for CPX

## 2013-09-02 NOTE — Patient Instructions (Signed)
Limit your sodium (Salt) intake  Please check your blood pressure on a regular basis.  If it is consistently greater than 150/90, please make an office appointment.    It is important that you exercise regularly, at least 20 minutes 3 to 4 times per week.  If you develop chest pain or shortness of breath seek  medical attention.  You need to lose weight.  Consider a lower calorie diet and regular exercise.  Avoids foods high in acid such as tomatoes citrus juices, and spicy foods.  Avoid eating within two hours of lying down or before exercising.  Do not overheat.  Try smaller more frequent meals.  If symptoms persist, elevate the head of her bed 12 inches while sleeping.  Return in one year for follow-up

## 2014-08-29 ENCOUNTER — Other Ambulatory Visit (INDEPENDENT_AMBULATORY_CARE_PROVIDER_SITE_OTHER): Payer: Medicare Other

## 2014-08-29 DIAGNOSIS — I10 Essential (primary) hypertension: Secondary | ICD-10-CM

## 2014-08-29 DIAGNOSIS — Z0000 Encounter for general adult medical examination without abnormal findings: Secondary | ICD-10-CM

## 2014-08-29 LAB — POCT URINALYSIS DIPSTICK
BILIRUBIN UA: NEGATIVE
Glucose, UA: NEGATIVE
KETONES UA: NEGATIVE
Leukocytes, UA: NEGATIVE
Nitrite, UA: NEGATIVE
Protein, UA: NEGATIVE
RBC UA: NEGATIVE
Spec Grav, UA: 1.015
Urobilinogen, UA: 0.2
pH, UA: 7

## 2014-08-29 LAB — LIPID PANEL
Cholesterol: 179 mg/dL (ref 0–200)
HDL: 34.5 mg/dL — AB (ref >39.00)
LDL Cholesterol: 128 mg/dL — ABNORMAL HIGH (ref 0–99)
NONHDL: 144.5
Total CHOL/HDL Ratio: 5
Triglycerides: 82 mg/dL (ref 0.0–149.0)
VLDL: 16.4 mg/dL (ref 0.0–40.0)

## 2014-08-29 LAB — BASIC METABOLIC PANEL
BUN: 24 mg/dL — ABNORMAL HIGH (ref 6–23)
CALCIUM: 8.8 mg/dL (ref 8.4–10.5)
CO2: 30 meq/L (ref 19–32)
Chloride: 103 mEq/L (ref 96–112)
Creatinine, Ser: 1.6 mg/dL — ABNORMAL HIGH (ref 0.4–1.5)
GFR: 44.22 mL/min — ABNORMAL LOW (ref >60.00)
Glucose, Bld: 79 mg/dL (ref 70–99)
Potassium: 4.3 mEq/L (ref 3.5–5.1)
SODIUM: 140 meq/L (ref 135–145)

## 2014-08-29 LAB — CBC WITH DIFFERENTIAL/PLATELET
BASOS PCT: 0.7 % (ref 0.0–3.0)
Basophils Absolute: 0 10*3/uL (ref 0.0–0.1)
EOS ABS: 0.2 10*3/uL (ref 0.0–0.7)
EOS PCT: 2.5 % (ref 0.0–5.0)
HCT: 43 % (ref 39.0–52.0)
HEMOGLOBIN: 14.2 g/dL (ref 13.0–17.0)
LYMPHS PCT: 27.9 % (ref 12.0–46.0)
Lymphs Abs: 1.7 10*3/uL (ref 0.7–4.0)
MCHC: 33 g/dL (ref 30.0–36.0)
MCV: 93.6 fl (ref 78.0–100.0)
MONOS PCT: 8.8 % (ref 3.0–12.0)
Monocytes Absolute: 0.5 10*3/uL (ref 0.1–1.0)
NEUTROS PCT: 60.1 % (ref 43.0–77.0)
Neutro Abs: 3.7 10*3/uL (ref 1.4–7.7)
Platelets: 208 10*3/uL (ref 150.0–400.0)
RBC: 4.59 Mil/uL (ref 4.22–5.81)
RDW: 13.8 % (ref 11.5–15.5)
WBC: 6.2 10*3/uL (ref 4.0–10.5)

## 2014-08-29 LAB — HEPATIC FUNCTION PANEL
ALK PHOS: 78 U/L (ref 39–117)
ALT: 14 U/L (ref 0–53)
AST: 20 U/L (ref 0–37)
Albumin: 3.1 g/dL — ABNORMAL LOW (ref 3.5–5.2)
BILIRUBIN DIRECT: 0.1 mg/dL (ref 0.0–0.3)
TOTAL PROTEIN: 6.8 g/dL (ref 6.0–8.3)
Total Bilirubin: 0.8 mg/dL (ref 0.2–1.2)

## 2014-08-29 LAB — TSH: TSH: 3.01 u[IU]/mL (ref 0.35–4.50)

## 2014-08-29 LAB — PSA: PSA: 2.37 ng/mL (ref 0.10–4.00)

## 2014-09-05 ENCOUNTER — Ambulatory Visit (INDEPENDENT_AMBULATORY_CARE_PROVIDER_SITE_OTHER): Payer: Medicare Other | Admitting: Internal Medicine

## 2014-09-05 ENCOUNTER — Encounter: Payer: Self-pay | Admitting: Internal Medicine

## 2014-09-05 VITALS — BP 136/72 | HR 83 | Temp 98.2°F | Resp 20 | Ht 71.5 in | Wt 204.0 lb

## 2014-09-05 DIAGNOSIS — Z0000 Encounter for general adult medical examination without abnormal findings: Secondary | ICD-10-CM

## 2014-09-05 DIAGNOSIS — Z00129 Encounter for routine child health examination without abnormal findings: Secondary | ICD-10-CM

## 2014-09-05 DIAGNOSIS — Z23 Encounter for immunization: Secondary | ICD-10-CM

## 2014-09-05 DIAGNOSIS — K219 Gastro-esophageal reflux disease without esophagitis: Secondary | ICD-10-CM

## 2014-09-05 DIAGNOSIS — I10 Essential (primary) hypertension: Secondary | ICD-10-CM

## 2014-09-05 DIAGNOSIS — E785 Hyperlipidemia, unspecified: Secondary | ICD-10-CM

## 2014-09-05 MED ORDER — LOSARTAN POTASSIUM-HCTZ 50-12.5 MG PO TABS: 1.0000 | ORAL_TABLET | Freq: Every day | ORAL | Status: DC

## 2014-09-05 NOTE — Patient Instructions (Signed)
Limit your sodium (Salt) intake  Please check your blood pressure on a regular basis.  If it is consistently greater than 150/90, please make an office appointment.    It is important that you exercise regularly, at least 20 minutes 3 to 4 times per week.  If you develop chest pain or shortness of breath seek  medical attention.  Health Maintenance A healthy lifestyle and preventative care can promote health and wellness.  Maintain regular health, dental, and eye exams.  Eat a healthy diet. Foods like vegetables, fruits, whole grains, low-fat dairy products, and lean protein foods contain the nutrients you need and are low in calories. Decrease your intake of foods high in solid fats, added sugars, and salt. Get information about a proper diet from your health care provider, if necessary.  Regular physical exercise is one of the most important things you can do for your health. Most adults should get at least 150 minutes of moderate-intensity exercise (any activity that increases your heart rate and causes you to sweat) each week. In addition, most adults need muscle-strengthening exercises on 2 or more days a week.   Maintain a healthy weight. The body mass index (BMI) is a screening tool to identify possible weight problems. It provides an estimate of body fat based on height and weight. Your health care provider can find your BMI and can help you achieve or maintain a healthy weight. For males 20 years and older:  A BMI below 18.5 is considered underweight.  A BMI of 18.5 to 24.9 is normal.  A BMI of 25 to 29.9 is considered overweight.  A BMI of 30 and above is considered obese.  Maintain normal blood lipids and cholesterol by exercising and minimizing your intake of saturated fat. Eat a balanced diet with plenty of fruits and vegetables. Blood tests for lipids and cholesterol should begin at age 33 and be repeated every 5 years. If your lipid or cholesterol levels are high, you are  over age 1, or you are at high risk for heart disease, you may need your cholesterol levels checked more frequently.Ongoing high lipid and cholesterol levels should be treated with medicines if diet and exercise are not working.  If you smoke, find out from your health care provider how to quit. If you do not use tobacco, do not start.  Lung cancer screening is recommended for adults aged 27-80 years who are at high risk for developing lung cancer because of a history of smoking. A yearly low-dose CT scan of the lungs is recommended for people who have at least a 30-pack-year history of smoking and are current smokers or have quit within the past 15 years. A pack year of smoking is smoking an average of 1 pack of cigarettes a day for 1 year (for example, a 30-pack-year history of smoking could mean smoking 1 pack a day for 30 years or 2 packs a day for 15 years). Yearly screening should continue until the smoker has stopped smoking for at least 15 years. Yearly screening should be stopped for people who develop a health problem that would prevent them from having lung cancer treatment.  If you choose to drink alcohol, do not have more than 2 drinks per day. One drink is considered to be 12 oz (360 mL) of beer, 5 oz (150 mL) of wine, or 1.5 oz (45 mL) of liquor.  Avoid the use of street drugs. Do not share needles with anyone. Ask for help if you  need support or instructions about stopping the use of drugs.  High blood pressure causes heart disease and increases the risk of stroke. Blood pressure should be checked at least every 1-2 years. Ongoing high blood pressure should be treated with medicines if weight loss and exercise are not effective.  If you are 71-23 years old, ask your health care provider if you should take aspirin to prevent heart disease.  Diabetes screening involves taking a blood sample to check your fasting blood sugar level. This should be done once every 3 years after age 68 if  you are at a normal weight and without risk factors for diabetes. Testing should be considered at a younger age or be carried out more frequently if you are overweight and have at least 1 risk factor for diabetes.  Colorectal cancer can be detected and often prevented. Most routine colorectal cancer screening begins at the age of 35 and continues through age 66. However, your health care provider may recommend screening at an earlier age if you have risk factors for colon cancer. On a yearly basis, your health care provider may provide home test kits to check for hidden blood in the stool. A small camera at the end of a tube may be used to directly examine the colon (sigmoidoscopy or colonoscopy) to detect the earliest forms of colorectal cancer. Talk to your health care provider about this at age 57 when routine screening begins. A direct exam of the colon should be repeated every 5-10 years through age 57, unless early forms of precancerous polyps or small growths are found.  People who are at an increased risk for hepatitis B should be screened for this virus. You are considered at high risk for hepatitis B if:  You were born in a country where hepatitis B occurs often. Talk with your health care provider about which countries are considered high risk.  Your parents were born in a high-risk country and you have not received a shot to protect against hepatitis B (hepatitis B vaccine).  You have HIV or AIDS.  You use needles to inject street drugs.  You live with, or have sex with, someone who has hepatitis B.  You are a man who has sex with other men (MSM).  You get hemodialysis treatment.  You take certain medicines for conditions like cancer, organ transplantation, and autoimmune conditions.  Hepatitis C blood testing is recommended for all people born from [birth year] through 1963 and any individual with known risk factors for hepatitis C.  Healthy men should no longer receive prostate-specific  antigen (PSA) blood tests as part of routine cancer screening. Talk to your health care provider about prostate cancer screening.  Testicular cancer screening is not recommended for adolescents or adult males who have no symptoms. Screening includes self-exam, a health care provider exam, and other screening tests. Consult with your health care provider about any symptoms you have or any concerns you have about testicular cancer.  Practice safe sex. Use condoms and avoid high-risk sexual practices to reduce the spread of sexually transmitted infections (STIs).  You should be screened for STIs, including gonorrhea and chlamydia if:  You are sexually active and are younger than 24 years.  You are older than 24 years, and your health care provider tells you that you are at risk for this type of infection.  Your sexual activity has changed since you were last screened, and you are at an increased risk for chlamydia or gonorrhea. Ask your health  care provider if you are at risk.  If you are at risk of being infected with HIV, it is recommended that you take a prescription medicine daily to prevent HIV infection. This is called pre-exposure prophylaxis (PrEP). You are considered at risk if:  You are a man who has sex with other men (MSM).  You are a heterosexual man who is sexually active with multiple partners.  You take drugs by injection.  You are sexually active with a partner who has HIV.  Talk with your health care provider about whether you are at high risk of being infected with HIV. If you choose to begin PrEP, you should first be tested for HIV. You should then be tested every 3 months for as long as you are taking PrEP.  Use sunscreen. Apply sunscreen liberally and repeatedly throughout the day. You should seek shade when your shadow is shorter than you. Protect yourself by wearing long sleeves, pants, a wide-brimmed hat, and sunglasses year round whenever you are outdoors.  Tell  your health care provider of new moles or changes in moles, especially if there is a change in shape or color. Also, tell your health care provider if a mole is larger than the size of a pencil eraser.  A one-time screening for abdominal aortic aneurysm (AAA) and surgical repair of large AAAs by ultrasound is recommended for men aged 23-75 years who are current or former smokers.  Stay current with your vaccines (immunizations). Document Released: 04/18/2008 Document Revised: 10/26/2013 Document Reviewed: 03/18/2011 [facility] Patient Information 2015 [place], [place]. This information is not intended to replace advice given to you by your health care provider. Make sure you discuss any questions you have with your health care provider.

## 2014-09-05 NOTE — Progress Notes (Signed)
Patient ID: Alex Dunn, male   DOB: [birth year], 78 y.o.   MRN: [id number]  Subjective:    Patient ID: Alex Dunn, male    DOB: [birth year], 78 y.o.   MRN: [id number]  Hypertension Pertinent negatives include no chest pain, headaches or palpitations.   78  year-old patient who is in today for followup of hypertension and for a preventive health examination.  He remains on Protonix for GERD which has been stable. He had been on amlodipine 5 mg daily which was discontinued last year.  He presently is on a combination of losartan with hydrochlorothiazide and has done quite well;   he does monitor home blood pressure readings with readings are often and a low normal range and associated with dizziness.  Otherwise, no complaints Last screening colonoscopy was about 10 years ago  Past Medical History  Diagnosis Date  . GERD 06/23/2007  . BENIGN PROSTATIC HYPERTROPHY 06/23/2007  . ANEMIA-IRON DEFICIENCY 06/23/2007    History   Social History  . Marital Status: Married    Spouse Name: N/A    Number of Children: N/A  . Years of Education: N/A   Occupational History  . Not on file.   Social History Main Topics  . Smoking status: Never Smoker   . Smokeless tobacco: Never Used  . Alcohol Use: No  . Drug Use: No  . Sexual Activity: Not on file   Other Topics Concern  . Not on file   Social History Narrative    Past Surgical History  Procedure Laterality Date  . Hand surgery      Family History  Problem Relation Age of Onset  . Stroke Brother   . Cancer Brother     prostate ca    Allergies  Allergen Reactions  . Codeine Phosphate     Current Outpatient Prescriptions on File Prior to Visit  Medication Sig Dispense Refill  . losartan-hydrochlorothiazide (HYZAAR) 100-25 MG per tablet Take 1 tablet by mouth daily. 90 tablet 5  . pantoprazole (PROTONIX) 40 MG tablet Take 1 tablet (40 mg total) by mouth daily as needed. 90 tablet 3   No current facility-administered  medications on file prior to visit.    BP 136/72 mmHg  Pulse 83  Temp(Src) 98.2 F (36.8 C) (Oral)  Resp 20  Ht 5' 11.5" (1.816 m)  Wt 204 lb (92.534 kg)  BMI 28.06 kg/m2  SpO2 98%   He was evaluated for iron deficiency anemia in 2003 no colonoscopy since that time.  Current Allergies:  ! CODEINE PHOSPHATE (CODEINE PHOSPHATE)   Past Surgical History:  Hand surgery 1994  history of spontaneous pneumothorax in 1957   Family History:  father died att 20 from an MI. 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 Risk factors, based on past  M,S,F history-  cardiovascular risk factors include family history of heart disease and hypertension  2.  Physical activities: Fairly sedentary but remains active. No regular exercise program  3.  Depression/mood: No history depression or mood disorder  4.  Hearing: No deficits  5.  ADL's: Independent in all aspects of daily living. Still works part-time for [name]  6.  Fall risk: Low  7.  Home safety: No problems identified  8.  Height weight, and visual acuity; height and weight stable no change in visual acuity  9.  Counseling:  Colonoscopy will regular exercise encouraged  10. Lab orders based on risk factors: Laboratory screen discussed  11. Referral : Colonoscopy 12. Care plan: Colonoscopy more regular exercise heart healthy diet all encouraged  13. Cognitive assessment: Alert and oriented with normal affect. No cognitive dysfunction.  14.  Preventive services will include an annual health assessment with routine lab.  Annual eye examinations recommended.  Patient was provided with a written and personalized care plan  15.  Provider list includes primary care ophthalmology and radiology      Review of Systems  Constitutional: Negative for fever, chills, appetite change and fatigue.  HENT: Negative for congestion,  dental problem, ear pain, hearing loss, sore throat, tinnitus, trouble swallowing and voice change.   Eyes: Negative for pain, discharge and visual disturbance.  Respiratory: Negative for cough, chest tightness, wheezing and stridor.   Cardiovascular: Negative for chest pain, palpitations and leg swelling.  Gastrointestinal: Negative for nausea, vomiting, abdominal pain, diarrhea, constipation, blood in stool and abdominal distention.  Genitourinary: Negative for urgency, hematuria, flank pain, discharge, difficulty urinating and genital sores.  Musculoskeletal: Negative for myalgias, back pain, joint swelling, arthralgias, gait problem and neck stiffness.  Skin: Negative for rash.  Neurological: Negative for dizziness, syncope, speech difficulty, weakness, numbness and headaches.  Hematological: Negative for adenopathy. Does not bruise/bleed easily.  Psychiatric/Behavioral: Negative for behavioral problems and dysphoric mood. The patient is not nervous/anxious.        Objective:   Physical Exam         Assessment & Plan:   Preventive health examination.  Follow-up colonoscopy discussed.  Patient wishes to consider diagnostic studies only Hypertension stable. We'll decrease Hyzaar to 50-12 point 5.  Continue close home blood pressure monitoring Gastroesophageal reflux disease stable antireflux regimen encouraged  Low-salt diet recommended

## 2014-09-05 NOTE — Progress Notes (Signed)
Pre visit review using our clinic review tool, if applicable. No additional management support is needed unless otherwise documented below in the visit note. 

## 2014-09-06 ENCOUNTER — Telehealth: Payer: Self-pay | Admitting: Internal Medicine

## 2014-09-06 NOTE — Telephone Encounter (Signed)
emmi emailed °

## 2014-09-19 NOTE — Telephone Encounter (Signed)
Physical Exam  Constitutional: He is oriented to person, place, and time. He appears well-developed.  HENT:  Head: Normocephalic.  Right Ear: External ear normal.  Left Ear: External ear normal.  Eyes: Conjunctivae and EOM are normal.  Neck: Normal range of motion.  Cardiovascular: Normal rate and normal heart sounds.  Pulmonary/Chest: Breath sounds normal.  Abdominal: Bowel sounds are normal.  Musculoskeletal: Normal range of motion. He exhibits no edema and no tenderness.  Neurological: He is alert and oriented to person, place, and time.  Psychiatric: He has a normal mood and affect. His behavior is normal.

## 2014-09-26 NOTE — Telephone Encounter (Signed)
Physical Exam  Constitutional: He is oriented to person, place, and time. He appears well-developed.  HENT:  Head: Normocephalic.  Right Ear: External ear normal.  Left Ear: External ear normal.  Eyes: Conjunctivae and EOM are normal.  Neck: Normal range of motion.  Cardiovascular: Normal rate and normal heart sounds.  Pulmonary/Chest: Breath sounds normal.  Abdominal: Bowel sounds are normal.  Musculoskeletal: Normal range of motion. He exhibits no edema and no tenderness.  Neurological: He is alert and oriented to person, place, and time.  Psychiatric: He has a normal mood and affect. His behavior is normal.

## 2014-12-04 ENCOUNTER — Encounter (HOSPITAL_BASED_OUTPATIENT_CLINIC_OR_DEPARTMENT_OTHER): Payer: Self-pay | Admitting: *Deleted

## 2014-12-04 ENCOUNTER — Emergency Department (HOSPITAL_BASED_OUTPATIENT_CLINIC_OR_DEPARTMENT_OTHER)
Admission: EM | Admit: 2014-12-04 | Discharge: 2014-12-04 | Disposition: A | Discharge status: Home / Self Care | Payer: Medicare Other | Attending: Emergency Medicine | Admitting: Emergency Medicine

## 2014-12-04 ENCOUNTER — Emergency Department (HOSPITAL_BASED_OUTPATIENT_CLINIC_OR_DEPARTMENT_OTHER): Payer: Medicare Other

## 2014-12-04 DIAGNOSIS — Z79899 Other long term (current) drug therapy: Secondary | ICD-10-CM | POA: Insufficient documentation

## 2014-12-04 DIAGNOSIS — Z87448 Personal history of other diseases of urinary system: Secondary | ICD-10-CM | POA: Insufficient documentation

## 2014-12-04 DIAGNOSIS — K219 Gastro-esophageal reflux disease without esophagitis: Secondary | ICD-10-CM | POA: Insufficient documentation

## 2014-12-04 DIAGNOSIS — J069 Acute upper respiratory infection, unspecified: Secondary | ICD-10-CM | POA: Diagnosis not present

## 2014-12-04 DIAGNOSIS — Z862 Personal history of diseases of the blood and blood-forming organs and certain disorders involving the immune mechanism: Secondary | ICD-10-CM | POA: Diagnosis not present

## 2014-12-04 DIAGNOSIS — R059 Cough, unspecified: Secondary | ICD-10-CM

## 2014-12-04 DIAGNOSIS — I10 Essential (primary) hypertension: Secondary | ICD-10-CM | POA: Insufficient documentation

## 2014-12-04 DIAGNOSIS — R05 Cough: Secondary | ICD-10-CM | POA: Diagnosis present

## 2014-12-04 HISTORY — DX: Essential (primary) hypertension: I10

## 2014-12-04 NOTE — ED Notes (Signed)
Pt states he has a dry cough, feels like it worsened today

## 2014-12-04 NOTE — Discharge Instructions (Signed)
Upper Respiratory Infection, Adult An upper respiratory infection (URI) is also sometimes known as the common cold. The upper respiratory tract includes the nose, sinuses, throat, trachea, and bronchi. Bronchi are the airways leading to the lungs. Most people improve within 1 week, but symptoms can last up to 2 weeks. A residual cough may last even longer.  CAUSES Many different viruses can infect the tissues lining the upper respiratory tract. The tissues become irritated and inflamed and often become very moist. Mucus production is also common. A cold is contagious. You can easily spread the virus to others by oral contact. This includes kissing, sharing a glass, coughing, or sneezing. Touching your mouth or nose and then touching a surface, which is then touched by another person, can also spread the virus. SYMPTOMS  Symptoms typically develop 1 to 3 days after you come in contact with a cold virus. Symptoms vary from person to person. They may include:  Runny nose.  Sneezing.  Nasal congestion.  Sinus irritation.  Sore throat.  Loss of voice (laryngitis).  Cough.  Fatigue.  Muscle aches.  Loss of appetite.  Headache.  Low-grade fever. DIAGNOSIS  You might diagnose your own cold based on familiar symptoms, since most people get a cold 2 to 3 times a year. Your caregiver can confirm this based on your exam. Most importantly, your caregiver can check that your symptoms are not due to another disease such as strep throat, sinusitis, pneumonia, asthma, or epiglottitis. Blood tests, throat tests, and X-rays are not necessary to diagnose a common cold, but they may sometimes be helpful in excluding other more serious diseases. Your caregiver will decide if any further tests are required. RISKS AND COMPLICATIONS  You may be at risk for a more severe case of the common cold if you smoke cigarettes, have chronic heart disease (such as heart failure) or lung disease (such as asthma), or if  you have a weakened immune system. The very young and very old are also at risk for more serious infections. Bacterial sinusitis, middle ear infections, and bacterial pneumonia can complicate the common cold. The common cold can worsen asthma and chronic obstructive pulmonary disease (COPD). Sometimes, these complications can require emergency medical care and may be life-threatening. PREVENTION  The best way to protect against getting a cold is to practice good hygiene. Avoid oral or hand contact with people with cold symptoms. Wash your hands often if contact occurs. There is no clear evidence that vitamin C, vitamin E, echinacea, or exercise reduces the chance of developing a cold. However, it is always recommended to get plenty of rest and practice good nutrition. TREATMENT  Treatment is directed at relieving symptoms. There is no cure. Antibiotics are not effective, because the infection is caused by a virus, not by bacteria. Treatment may include:  Increased fluid intake. Sports drinks offer valuable electrolytes, sugars, and fluids.  Breathing heated mist or steam (vaporizer or shower).  Eating chicken soup or other clear broths, and maintaining good nutrition.  Getting plenty of rest.  Using gargles or lozenges for comfort.  Controlling fevers with ibuprofen or acetaminophen as directed by your caregiver.  Increasing usage of your inhaler if you have asthma. Zinc gel and zinc lozenges, taken in the first 24 hours of the common cold, can shorten the duration and lessen the severity of symptoms. Pain medicines may help with fever, muscle aches, and throat pain. A variety of non-prescription medicines are available to treat congestion and runny nose. Your caregiver   can make recommendations and may suggest nasal or lung inhalers for other symptoms.  HOME CARE INSTRUCTIONS   Only take over-the-counter or prescription medicines for pain, discomfort, or fever as directed by your  caregiver.  Use a warm mist humidifier or inhale steam from a shower to increase air moisture. This may keep secretions moist and make it easier to breathe.  Drink enough water and fluids to keep your urine clear or pale yellow.  Rest as needed.  Return to work when your temperature has returned to normal or as your caregiver advises. You may need to stay home longer to avoid infecting others. You can also use a face mask and careful hand washing to prevent spread of the virus. SEEK MEDICAL CARE IF:   After the first few days, you feel you are getting worse rather than better.  You need your caregiver's advice about medicines to control symptoms.  You develop chills, worsening shortness of breath, or brown or red sputum. These may be signs of pneumonia.  You develop yellow or brown nasal discharge or pain in the face, especially when you bend forward. These may be signs of sinusitis.  You develop a fever, swollen neck glands, pain with swallowing, or white areas in the back of your throat. These may be signs of strep throat. SEEK IMMEDIATE MEDICAL CARE IF:   You have a fever.  You develop severe or persistent headache, ear pain, sinus pain, or chest pain.  You develop wheezing, a prolonged cough, cough up blood, or have a change in your usual mucus (if you have chronic lung disease).  You develop sore muscles or a stiff neck. Document Released: 04/16/2001 Document Revised: 01/13/2012 Document Reviewed: 01/26/2014 [employer] Patient Information 2015 [employer]. This information is not intended to replace advice given to you by your health care provider. Make sure you discuss any questions you have with your health care provider.  

## 2014-12-04 NOTE — ED Provider Notes (Signed)
CSN: [id number]     Arrival date & time 12/04/14  1644 History  This chart was scribed for Arbie Cookey, MD by Tula Nakayama, ED Scribe. This patient was seen in room MH09/MH09 and the patient's care was started at 5:47 PM.    Chief Complaint  Patient presents with  . Cough    Patient is a 79 y.o. male presenting with cough. The history is provided by the patient. No language interpreter was used.  Cough Cough characteristics:  Dry Severity:  Moderate Onset quality:  Gradual Duration:  4 days Timing:  Intermittent Progression:  Unchanged Chronicity:  New Smoker: no   Relieved by:  Decongestant Worsened by:  Nothing tried Ineffective treatments:  None tried Associated symptoms: headaches and rhinorrhea   Associated symptoms: no chest pain, no chills, no diaphoresis, no fever, no shortness of breath, no sinus congestion and no sore throat     HPI Comments: Alex Dunn is a 79 y.o. male who presents to the Emergency Department complaining of intermittent dry cough that started 3-4 days ago and became worse today. He states rhinorrhea that started 1 week ago, HA and fatigue as associated symptoms. Pt tried Sudafed with some relief, but stopped taking medication a few days ago because of brief improvement of symptoms. He denies recent changes in activity. Pt also denies abdominal pain, nausea, vomiting, diarrhea, sore throat, congestion, body aches, fever, diaphoresis, chills, SOB, leg swelling, and CP as associated symptoms.  PCP is Burnice Logan  Past Medical History  Diagnosis Date  . GERD 06/23/2007  . BENIGN PROSTATIC HYPERTROPHY 06/23/2007  . ANEMIA-IRON DEFICIENCY 06/23/2007  . Hypertension    Past Surgical History  Procedure Laterality Date  . Hand surgery     Family History  Problem Relation Age of Onset  . Stroke Brother   . Cancer Brother     prostate ca   History  Substance Use Topics  . Smoking status: Never Smoker   . Smokeless tobacco: Never Used   . Alcohol Use: No    Review of Systems  Constitutional: Positive for fatigue. Negative for fever, chills and diaphoresis.  HENT: Positive for rhinorrhea. Negative for congestion and sore throat.   Respiratory: Positive for cough. Negative for shortness of breath.   Cardiovascular: Negative for chest pain and leg swelling.  Gastrointestinal: Negative for nausea, vomiting, abdominal pain and diarrhea.  Neurological: Positive for headaches.  All other systems reviewed and are negative.     Allergies  Codeine phosphate  Home Medications   Prior to Admission medications   Medication Sig Start Date End Date Taking? Authorizing Provider  losartan-hydrochlorothiazide (HYZAAR) 50-12.5 MG per tablet Take 1 tablet by mouth daily. 09/05/14   Marletta Lor, MD  pantoprazole (PROTONIX) 40 MG tablet Take 1 tablet (40 mg total) by mouth daily as needed. 09/02/13   Marletta Lor, MD   BP 147/72 mmHg  Pulse 77  Temp(Src) 98.7 F (37.1 C) (Oral)  Resp 16  SpO2 100% Physical Exam  Constitutional: He is oriented to person, place, and time. He appears well-developed and well-nourished. No distress.  HENT:  Head: Normocephalic and atraumatic.  Nose: Right sinus exhibits no maxillary sinus tenderness and no frontal sinus tenderness. Left sinus exhibits no maxillary sinus tenderness and no frontal sinus tenderness.  Mouth/Throat: Mucous membranes are normal. Posterior oropharyngeal erythema (minimal) present. No oropharyngeal exudate, posterior oropharyngeal edema or tonsillar abscesses.  Eyes: Conjunctivae are normal. Pupils are equal, round, and reactive to light. No  scleral icterus.  Neck: Neck supple.  Cardiovascular: Normal rate, regular rhythm, normal heart sounds and intact distal pulses.   No murmur heard. Pulmonary/Chest: Effort normal and breath sounds normal. No stridor. No respiratory distress. He has no wheezes. He has no rales.  Abdominal: Soft. He exhibits no distension.  There is no tenderness.  Musculoskeletal: Normal range of motion. He exhibits no edema.  Neurological: He is alert and oriented to person, place, and time.  Skin: Skin is warm and dry. No rash noted.  Psychiatric: He has a normal mood and affect. His behavior is normal.  Nursing note and vitals reviewed.   ED Course  Procedures (including critical care time) DIAGNOSTIC STUDIES: Oxygen Saturation is 100% on RA, normal by my interpretation.    COORDINATION OF CARE: 5:53 PM Discussed x-ray results and treatment plan with pt. He agreed to plan. Advised pt to follow-up with PCP with fever or SOB.   Labs Review Labs Reviewed - No data to display  Imaging Review Dg Chest 2 View  12/04/2014   CLINICAL DATA:  Chronic cough for 1 week, worse today. History of hypertension.  EXAM: CHEST  2 VIEW  COMPARISON:  09/01/2012.  FINDINGS: Cardiac silhouette normal in size and configuration. Normal mediastinal and hilar contours.  Clear lungs. Mild elevation the right hemidiaphragm, stable. No pleural effusion or pneumothorax.  Bony thorax is intact.  IMPRESSION: No active cardiopulmonary disease.   Electronically Signed   By: Lajean Manes M.D.   On: 12/04/2014 17:24  All radiology studies independently viewed by me.      EKG Interpretation None      MDM   Final diagnoses:  Acute URI    Well appearing nontoxic male presenting with dry cough for past week.  He started to feel better a few days ago, but today has felt very run down.  No fevers, SOB, or CP.  Lungs are clear.  Vitals stable.  CXR obtained and negative.  I suspect he has an acute URI.  Advised supportive treatment and PCP follow up.  I don't think antibiotics are indicated.  Pt comfortable with plan.  I personally performed the services described in this documentation, which was scribed in my presence. The recorded information has been reviewed and is accurate.      Houston Siren III, MD 12/04/14 [id number]

## 2014-12-06 ENCOUNTER — Ambulatory Visit (INDEPENDENT_AMBULATORY_CARE_PROVIDER_SITE_OTHER): Payer: Medicare Other | Admitting: Internal Medicine

## 2014-12-06 ENCOUNTER — Encounter: Payer: Self-pay | Admitting: Internal Medicine

## 2014-12-06 VITALS — BP 130/88 | HR 82 | Temp 98.5°F | Resp 20 | Ht 71.5 in | Wt 202.0 lb

## 2014-12-06 DIAGNOSIS — J069 Acute upper respiratory infection, unspecified: Secondary | ICD-10-CM

## 2014-12-06 DIAGNOSIS — B9789 Other viral agents as the cause of diseases classified elsewhere: Secondary | ICD-10-CM

## 2014-12-06 DIAGNOSIS — I10 Essential (primary) hypertension: Secondary | ICD-10-CM

## 2014-12-06 MED ORDER — BENZONATATE 200 MG PO CAPS: 200.0000 mg | ORAL_CAPSULE | Freq: Two times a day (BID) | ORAL | Status: DC | PRN

## 2014-12-06 NOTE — Progress Notes (Signed)
Pre visit review using our clinic review tool, if applicable. No additional management support is needed unless otherwise documented below in the visit note. 

## 2014-12-06 NOTE — Progress Notes (Signed)
Subjective:    Patient ID: Alex Dunn, male    DOB: 07-15-1936, 79 y.o.   MRN: [id number]  HPI  79 year old patient who presents with head and chest congestion, postnasal drip and cough.  More recently has developed some generalized achiness.  His chief complaint is dry, nonproductive cough.  He has been using Mucinex and Sudafed.  There has been no fever or sputum production.  No shortness of breath or wheezing.  He has been sleeping poorly due to the refractory cough and generally feels unwell He has essential hypertension which has been stable.  Past Medical History  Diagnosis Date  . GERD 06/23/2007  . BENIGN PROSTATIC HYPERTROPHY 06/23/2007  . ANEMIA-IRON DEFICIENCY 06/23/2007  . Hypertension     History   Social History  . Marital Status: Married    Spouse Name: N/A    Number of Children: N/A  . Years of Education: N/A   Occupational History  . Not on file.   Social History Main Topics  . Smoking status: Never Smoker   . Smokeless tobacco: Never Used  . Alcohol Use: No  . Drug Use: No  . Sexual Activity: Not on file   Other Topics Concern  . Not on file   Social History Narrative    Past Surgical History  Procedure Laterality Date  . Hand surgery      Family History  Problem Relation Age of Onset  . Stroke Brother   . Cancer Brother     prostate ca    Allergies  Allergen Reactions  . Codeine Phosphate     Current Outpatient Prescriptions on File Prior to Visit  Medication Sig Dispense Refill  . losartan-hydrochlorothiazide (HYZAAR) 50-12.5 MG per tablet Take 1 tablet by mouth daily. 90 tablet 6  . pantoprazole (PROTONIX) 40 MG tablet Take 1 tablet (40 mg total) by mouth daily as needed. 90 tablet 3   No current facility-administered medications on file prior to visit.    BP 130/88 mmHg  Pulse 82  Temp(Src) 98.5 F (36.9 C) (Oral)  Resp 20  Ht 5' 11.5" (1.816 m)  Wt 202 lb (91.627 kg)  BMI 27.78 kg/m2  SpO2 97%      Review of  Systems  Constitutional: Positive for activity change, appetite change and fatigue. Negative for fever and chills.  HENT: Positive for postnasal drip. Negative for congestion, dental problem, ear pain, hearing loss, sore throat, tinnitus, trouble swallowing and voice change.   Eyes: Negative for pain, discharge and visual disturbance.  Respiratory: Positive for cough. Negative for chest tightness, wheezing and stridor.   Cardiovascular: Negative for chest pain, palpitations and leg swelling.  Gastrointestinal: Negative for nausea, vomiting, abdominal pain, diarrhea, constipation, blood in stool and abdominal distention.  Genitourinary: Negative for urgency, hematuria, flank pain, discharge, difficulty urinating and genital sores.  Musculoskeletal: Negative for myalgias, back pain, joint swelling, arthralgias, gait problem and neck stiffness.  Skin: Negative for rash.  Neurological: Positive for weakness. Negative for dizziness, syncope, speech difficulty, numbness and headaches.  Hematological: Negative for adenopathy. Does not bruise/bleed easily.  Psychiatric/Behavioral: Negative for behavioral problems and dysphoric mood. The patient is not nervous/anxious.        Objective:   Physical Exam  Constitutional: He is oriented to person, place, and time. He appears well-developed.  HENT:  Head: Normocephalic.  Right Ear: External ear normal.  Left Ear: External ear normal.  Eyes: Conjunctivae and EOM are normal.  Neck: Normal range of motion.  Cardiovascular:  Normal rate, regular rhythm and normal heart sounds.   Pulse rate 80  Pulmonary/Chest: Effort normal and breath sounds normal. No respiratory distress. He has no wheezes. He has no rales.  O2 saturation 97  Abdominal: Bowel sounds are normal.  Musculoskeletal: Normal range of motion. He exhibits no edema or tenderness.  Neurological: He is alert and oriented to person, place, and time.  Psychiatric: He has a normal mood and affect.  His behavior is normal.          Assessment & Plan:   Viral URI with refractory cough.  Will treat with hydrocodone for cough suppressant and continue Mucinex. Hypertension, well-controlled

## 2014-12-06 NOTE — Patient Instructions (Signed)
Acute bronchitis symptoms  are generally not helped by antibiotics.  Take over-the-counter expectorants and cough medications such as  Mucinex DM.  Call if there is no improvement in 5 to 7 days or if  you develop worsening cough, fever, or new symptoms, such as shortness of breath or chest pain.  Use  Tessalon twice daily  HOME CARE INSTRUCTIONS  Get plenty of rest.  Drink enough fluids to keep your urine clear or pale yellow (unless you have a medical condition that requires fluid restriction). Increasing fluids may help thin your respiratory secretions (sputum) and reduce chest congestion, and it will prevent dehydration.  Take medicines only as directed by your health care provider.   Reduce the chances of another bout of acute bronchitis by washing your hands frequently, avoiding people with cold symptoms, and trying not to touch your hands to your mouth, nose, or eyes.  Keep all follow-up visits as directed by your health care provider.

## 2015-01-06 ENCOUNTER — Ambulatory Visit (INDEPENDENT_AMBULATORY_CARE_PROVIDER_SITE_OTHER): Payer: Medicare Other | Admitting: Internal Medicine

## 2015-01-06 ENCOUNTER — Encounter: Payer: Self-pay | Admitting: Internal Medicine

## 2015-01-06 VITALS — BP 142/78 | HR 74 | Temp 97.7°F | Resp 20 | Ht 71.5 in | Wt 204.0 lb

## 2015-01-06 DIAGNOSIS — I10 Essential (primary) hypertension: Secondary | ICD-10-CM

## 2015-01-06 DIAGNOSIS — M25511 Pain in right shoulder: Secondary | ICD-10-CM

## 2015-01-06 MED ORDER — MELOXICAM 15 MG PO TABS: 15.0000 mg | ORAL_TABLET | Freq: Every day | ORAL | Status: DC

## 2015-01-06 NOTE — Progress Notes (Signed)
Pre visit review using our clinic review tool, if applicable. No additional management support is needed unless otherwise documented below in the visit note. 

## 2015-01-06 NOTE — Patient Instructions (Signed)
You  may move around, but avoid painful motions and activities.  Apply heat to the sore area for 15 to 20 minutes 3 or 4 times daily for the next two to 3 days.  Shoulder Range of Motion Exercises The shoulder is the most flexible joint in the human body. Because of this it is also the most unstable joint in the body. All ages can develop shoulder problems. Early treatment of problems is necessary for a good outcome. People react to shoulder pain by decreasing the movement of the joint. After a brief period of time, the shoulder can become "frozen". This is an almost complete loss of the ability to move the damaged shoulder. Following injuries your caregivers can give you instructions on exercises to keep your range of motion (ability to move your shoulder freely), or regain it if it has been lost.  [place]: Codman's Exercise or Pendulum Exercise  This exercise may be performed in a prone (face-down) lying position or standing while leaning on a chair with the opposite arm. Its purpose is to relax the muscles in your shoulder and slowly but surely increase the range of motion and to relieve pain.  Lie on your stomach close to the side edge of the bed. Let your weak arm hang over the edge of the bed. Relax your shoulder, arm and hand. Let your shoulder blade relax and drop down.  Slowly and gently swing your arm forward and back. Do not use your neck muscles; relax them. It might be easier to have someone else gently start swinging your arm.  As pain decreases, increase your swing. To start, arm swing should begin at 15 degree angles. In time and as pain lessens, move to 30-45 degree angles. Start with swinging for about 15 seconds, and work towards swinging for 3 to 5 minutes.  This exercise may also be performed in a standing/bent over position.  Stand and hold onto a sturdy chair with your good arm. Bend forward at the waist and bend your  knees slightly to help protect your back. Relax your weak arm, let it hang limp. Relax your shoulder blade and let it drop.  Keep your shoulder relaxed and use body motion to swing your arm in small circles.  Stand up tall and relax.  Repeat motion and change direction of circles.  Start with swinging for about 30 seconds, and work towards swinging for 3 to 5 minutes. STRETCHING EXERCISES:  Lift your arm out in front of you with the elbow bent at 90 degrees. Using your other arm gently pull the elbow forward and across your body.  Bend one arm behind you with the palm facing outward. Using the other arm, hold a towel or rope and reach this arm up above your head, then bend it at the elbow to move your wrist to behind your neck. Grab the free end of the towel with the hand behind your back. Gently pull the towel up with the hand behind your neck, gradually increasing the pull on the hand behind the small of your back. Then, gradually pull down with the hand behind the small of your back. This will pull the hand and arm behind your neck further. Both shoulders will have an increased range of motion with repetition of this exercise. STRENGTHENING EXERCISES:  Standing with your arm at your side and straight out from your shoulder with the elbow bent at 90 degrees, hold onto a small  weight and slowly raise your hand so it points straight up in the air. Repeat this five times to begin with, and gradually increase to ten times. Do this four times per day. As you grow stronger you can gradually increase the weight.  Repeat the above exercise, only this time using an elastic band. Start with your hand up in the air and pull down until your hand is by your side. As you grow stronger, gradually increase the amount you pull by increasing the number or size of the elastic bands. Use the same amount of repetitions.  Standing with your hand at your side and holding onto a weight, gradually lift the hand in  front of you until it is over your head. Do the same also with the hand remaining at your side and lift the hand away from your body until it is again over your head. Repeat this five times to begin with, and gradually increase to ten times. Do this four times per day. As you grow stronger you can gradually increase the weight. Document Released: 07/20/2003 Document Revised: 10/26/2013 Document Reviewed: 10/21/2005 [facility] Patient Information 2015 [place], [place]. This information is not intended to replace advice given to you by your health care provider. Make sure you discuss any questions you have with your health care provider.

## 2015-01-06 NOTE — Progress Notes (Signed)
Subjective:    Patient ID: Alex Dunn, male    DOB: 1936-01-27, 79 y.o.   MRN: [id number]  HPI  79 year old patient who has a history of treated hypertension and gastroesophageal reflux disease.  Remains on PPI therapy. The past 4 weeks he has had some intermittent right shoulder pain that he describes as quite severe at times.  He states that he was at his barber recently and had a massage which was quite helpful. No trauma.  Also complains of some minor a left hip discomfort which has improved.  Past Medical History  Diagnosis Date  . GERD 06/23/2007  . BENIGN PROSTATIC HYPERTROPHY 06/23/2007  . ANEMIA-IRON DEFICIENCY 06/23/2007  . Hypertension     History   Social History  . Marital Status: Married    Spouse Name: N/A  . Number of Children: N/A  . Years of Education: N/A   Occupational History  . Not on file.   Social History Main Topics  . Smoking status: Never Smoker   . Smokeless tobacco: Never Used  . Alcohol Use: No  . Drug Use: No  . Sexual Activity: Not on file   Other Topics Concern  . Not on file   Social History Narrative    Past Surgical History  Procedure Laterality Date  . Hand surgery      Family History  Problem Relation Age of Onset  . Stroke Brother   . Cancer Brother     prostate ca    Allergies  Allergen Reactions  . Codeine Phosphate     Current Outpatient Prescriptions on File Prior to Visit  Medication Sig Dispense Refill  . losartan-hydrochlorothiazide (HYZAAR) 50-12.5 MG per tablet Take 1 tablet by mouth daily. 90 tablet 6  . pantoprazole (PROTONIX) 40 MG tablet Take 1 tablet (40 mg total) by mouth daily as needed. 90 tablet 3   No current facility-administered medications on file prior to visit.    BP 142/78 mmHg  Pulse 74  Temp(Src) 97.7 F (36.5 C) (Oral)  Resp 20  Ht 5' 11.5" (1.816 m)  Wt 204 lb (92.534 kg)  BMI 28.06 kg/m2  SpO2 97%     Review of Systems  Constitutional: Negative for fever, chills,  appetite change and fatigue.  HENT: Negative for congestion, dental problem, ear pain, hearing loss, sore throat, tinnitus, trouble swallowing and voice change.   Eyes: Negative for pain, discharge and visual disturbance.  Respiratory: Negative for cough, chest tightness, wheezing and stridor.   Cardiovascular: Negative for chest pain, palpitations and leg swelling.  Gastrointestinal: Negative for nausea, vomiting, abdominal pain, diarrhea, constipation, blood in stool and abdominal distention.  Genitourinary: Negative for urgency, hematuria, flank pain, discharge, difficulty urinating and genital sores.  Musculoskeletal: Negative for myalgias, back pain, joint swelling, arthralgias, gait problem and neck stiffness.       Neck and right shoulder pain  Skin: Negative for rash.  Neurological: Negative for dizziness, syncope, speech difficulty, weakness, numbness and headaches.  Hematological: Negative for adenopathy. Does not bruise/bleed easily.  Psychiatric/Behavioral: Negative for behavioral problems and dysphoric mood. The patient is not nervous/anxious.        Objective:   Physical Exam  Constitutional: He appears well-developed and well-nourished. No distress.  Neck:  Fairly full range of motion of the neck except decrease neck extension.  Range of motion caused minimal discomfort in the right neck and upper trapezius area   Musculoskeletal: Normal range of motion.  Full range of motion right shoulder without  pain No pain with provocative maneuvers          Assessment & Plan:   Right neck and shoulder pain.  Will treat with anti-inflammatories, warm compresses and observe Hypertension, stable

## 2015-07-28 ENCOUNTER — Telehealth: Payer: Self-pay | Admitting: Internal Medicine

## 2015-07-28 NOTE — Telephone Encounter (Signed)
Pt thought his cpe was in Nov, however it was not scheduled. Pt would like to knowif we can work in [place], nov 4. Dr Raliegh Ip is out 3 weeks after that,, and next available is Dec Is it ok to sch?

## 2015-07-28 NOTE — Telephone Encounter (Signed)
Lm on vm to cb and resc cpe.

## 2015-07-28 NOTE — Telephone Encounter (Signed)
Okay to schedule

## 2015-08-01 ENCOUNTER — Ambulatory Visit (INDEPENDENT_AMBULATORY_CARE_PROVIDER_SITE_OTHER): Payer: Medicare Other | Admitting: Internal Medicine

## 2015-08-01 ENCOUNTER — Encounter: Payer: Self-pay | Admitting: Internal Medicine

## 2015-08-01 VITALS — BP 128/76 | HR 75 | Temp 98.3°F | Resp 20 | Ht 71.5 in | Wt 205.0 lb

## 2015-08-01 DIAGNOSIS — Z23 Encounter for immunization: Secondary | ICD-10-CM | POA: Diagnosis not present

## 2015-08-01 DIAGNOSIS — M25562 Pain in left knee: Secondary | ICD-10-CM

## 2015-08-01 DIAGNOSIS — I10 Essential (primary) hypertension: Secondary | ICD-10-CM | POA: Diagnosis not present

## 2015-08-01 MED ORDER — HYDROCHLOROTHIAZIDE 12.5 MG PO TABS: 12.5000 mg | ORAL_TABLET | Freq: Every day | ORAL | Status: DC

## 2015-08-01 MED ORDER — MELOXICAM 15 MG PO TABS: 15.0000 mg | ORAL_TABLET | Freq: Every day | ORAL | Status: DC

## 2015-08-01 NOTE — Progress Notes (Signed)
Pre visit review using our clinic review tool, if applicable. No additional management support is needed unless otherwise documented below in the visit note. 

## 2015-08-01 NOTE — Progress Notes (Signed)
Subjective:    Patient ID: Alex Dunn, male    DOB: 12/13/1935, 79 y.o.   MRN: [id number]  HPI  79 year old patient who has treated hypertension.  He states that he often gets low normal.  Readings and has some fatigue. He is seen today with a chief complaint of left knee pain.  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  His right knee was also painful for a bit but has improved.  Pain is more prominent medially.  He has been using aspirin and Aleve  Past Medical History  Diagnosis Date  . GERD 06/23/2007  . BENIGN PROSTATIC HYPERTROPHY 06/23/2007  . ANEMIA-IRON DEFICIENCY 06/23/2007  . Hypertension     Social History   Social History  . Marital Status: Married    Spouse Name: N/A  . Number of Children: N/A  . Years of Education: N/A   Occupational History  . Not on file.   Social History Main Topics  . Smoking status: Never Smoker   . Smokeless tobacco: Never Used  . Alcohol Use: No  . Drug Use: No  . Sexual Activity: Not on file   Other Topics Concern  . Not on file   Social History Narrative    Past Surgical History  Procedure Laterality Date  . Hand surgery      Family History  Problem Relation Age of Onset  . Stroke Brother   . Cancer Brother     prostate ca    Allergies  Allergen Reactions  . Codeine Phosphate     Current Outpatient Prescriptions on File Prior to Visit  Medication Sig Dispense Refill  . acetaminophen (TYLENOL) 500 MG tablet Take 500 mg by mouth every 6 (six) hours as needed.    Marland Kitchen losartan-hydrochlorothiazide (HYZAAR) 50-12.5 MG per tablet Take 1 tablet by mouth daily. 90 tablet 6  . meloxicam (MOBIC) 15 MG tablet Take 1 tablet (15 mg total) by mouth daily. 30 tablet 0  . pantoprazole (PROTONIX) 40 MG tablet Take 1 tablet (40 mg total) by mouth daily as needed. 90 tablet 3   No current facility-administered medications on file prior to visit.    BP 128/76 mmHg   Pulse 75  Temp(Src) 98.3 F (36.8 C) (Oral)  Resp 20  Ht 5' 11.5" (1.816 m)  Wt 205 lb (92.987 kg)  BMI 28.20 kg/m2  SpO2 97%     Review of Systems  Constitutional: Negative for fever, chills, appetite change and fatigue.  HENT: Negative for congestion, dental problem, ear pain, hearing loss, sore throat, tinnitus, trouble swallowing and voice change.   Eyes: Negative for pain, discharge and visual disturbance.  Respiratory: Negative for cough, chest tightness, wheezing and stridor.   Cardiovascular: Negative for chest pain, palpitations and leg swelling.  Gastrointestinal: Negative for nausea, vomiting, abdominal pain, diarrhea, constipation, blood in stool and abdominal distention.  Genitourinary: Negative for urgency, hematuria, flank pain, discharge, difficulty urinating and genital sores.  Musculoskeletal: Positive for arthralgias and gait problem. Negative for myalgias, back pain, joint swelling and neck stiffness.  Skin: Negative for rash.  Neurological: Negative for dizziness, syncope, speech difficulty, weakness, numbness and headaches.  Hematological: Negative for adenopathy. Does not bruise/bleed easily.  Psychiatric/Behavioral: Negative for behavioral problems and dysphoric mood. The patient is not nervous/anxious.        Objective:   Physical Exam  Constitutional:  Blood pressure 120/76  Musculoskeletal:  Suggestion of mild soft  tissue swelling along the left medial knee.  Some mild tenderness along the medial joint line.  Full knee extension          Assessment & Plan:   Left knee pain.  Possible bursitis.  Will treat with Mobic for 30 days and observe.  Further.  Orthopedic evaluation if unimproved Hypertension.  Patient has had documented hypotension that has been mildly symptomatic.  Will change medication to hydrochlorothiazide 12 point 5 mg only  CPX as scheduled in December Will call if persistent knee pain for orthopedic referral

## 2015-08-01 NOTE — Patient Instructions (Signed)
Call for orthopedic referral if needed.  Pain persists  Limit your sodium (Salt) intake  Please check your blood pressure on a regular basis.  If it is consistently greater than 150/90, please make an office appointment.  Annual exam as scheduled

## 2015-09-19 NOTE — Telephone Encounter (Signed)
done

## 2015-10-12 ENCOUNTER — Other Ambulatory Visit (INDEPENDENT_AMBULATORY_CARE_PROVIDER_SITE_OTHER): Payer: Medicare Other

## 2015-10-12 DIAGNOSIS — I10 Essential (primary) hypertension: Secondary | ICD-10-CM | POA: Diagnosis not present

## 2015-10-12 DIAGNOSIS — Z0000 Encounter for general adult medical examination without abnormal findings: Secondary | ICD-10-CM

## 2015-10-12 LAB — POCT URINALYSIS DIPSTICK
BILIRUBIN UA: NEGATIVE
Blood, UA: NEGATIVE
GLUCOSE UA: NEGATIVE
KETONES UA: NEGATIVE
Leukocytes, UA: NEGATIVE
Nitrite, UA: NEGATIVE
SPEC GRAV UA: 1.02
Urobilinogen, UA: 1
pH, UA: 7

## 2015-10-12 LAB — CBC WITH DIFFERENTIAL/PLATELET
BASOS ABS: 0 10*3/uL (ref 0.0–0.1)
BASOS PCT: 0.6 % (ref 0.0–3.0)
EOS ABS: 0.1 10*3/uL (ref 0.0–0.7)
Eosinophils Relative: 2 % (ref 0.0–5.0)
HCT: 44.6 % (ref 39.0–52.0)
HEMOGLOBIN: 14.7 g/dL (ref 13.0–17.0)
LYMPHS PCT: 24.1 % (ref 12.0–46.0)
Lymphs Abs: 1.6 10*3/uL (ref 0.7–4.0)
MCHC: 33 g/dL (ref 30.0–36.0)
MCV: 93.8 fl (ref 78.0–100.0)
MONO ABS: 0.6 10*3/uL (ref 0.1–1.0)
Monocytes Relative: 9 % (ref 3.0–12.0)
Neutro Abs: 4.2 10*3/uL (ref 1.4–7.7)
Neutrophils Relative %: 64.3 % (ref 43.0–77.0)
PLATELETS: 191 10*3/uL (ref 150.0–400.0)
RBC: 4.75 Mil/uL (ref 4.22–5.81)
RDW: 13.3 % (ref 11.5–15.5)
WBC: 6.5 10*3/uL (ref 4.0–10.5)

## 2015-10-12 LAB — HEPATIC FUNCTION PANEL
ALK PHOS: 81 U/L (ref 39–117)
ALT: 12 U/L (ref 0–53)
AST: 15 U/L (ref 0–37)
Albumin: 3.6 g/dL (ref 3.5–5.2)
BILIRUBIN DIRECT: 0.1 mg/dL (ref 0.0–0.3)
BILIRUBIN TOTAL: 0.6 mg/dL (ref 0.2–1.2)
Total Protein: 6.5 g/dL (ref 6.0–8.3)

## 2015-10-12 LAB — BASIC METABOLIC PANEL
BUN: 19 mg/dL (ref 6–23)
CALCIUM: 8.9 mg/dL (ref 8.4–10.5)
CHLORIDE: 103 meq/L (ref 96–112)
CO2: 31 mEq/L (ref 19–32)
CREATININE: 1.51 mg/dL — AB (ref 0.40–1.50)
GFR: 47.82 mL/min — AB (ref >60.00)
Glucose, Bld: 89 mg/dL (ref 70–99)
Potassium: 4.1 mEq/L (ref 3.5–5.1)
Sodium: 141 mEq/L (ref 135–145)

## 2015-10-12 LAB — LIPID PANEL
CHOL/HDL RATIO: 5
Cholesterol: 163 mg/dL (ref 0–200)
HDL: 32.3 mg/dL — AB (ref >39.00)
LDL CALC: 112 mg/dL — AB (ref 0–99)
NONHDL: 130.73
TRIGLYCERIDES: 92 mg/dL (ref 0.0–149.0)
VLDL: 18.4 mg/dL (ref 0.0–40.0)

## 2015-10-12 LAB — PSA: PSA: 2.09 ng/mL (ref 0.10–4.00)

## 2015-10-12 LAB — TSH: TSH: 3.14 u[IU]/mL (ref 0.35–4.50)

## 2015-10-16 ENCOUNTER — Ambulatory Visit (INDEPENDENT_AMBULATORY_CARE_PROVIDER_SITE_OTHER): Payer: Medicare Other | Admitting: Internal Medicine

## 2015-10-16 ENCOUNTER — Encounter: Payer: Self-pay | Admitting: Internal Medicine

## 2015-10-16 VITALS — BP 140/78 | HR 81 | Temp 98.4°F | Ht 71.5 in | Wt 203.0 lb

## 2015-10-16 DIAGNOSIS — Z0000 Encounter for general adult medical examination without abnormal findings: Secondary | ICD-10-CM | POA: Diagnosis not present

## 2015-10-16 DIAGNOSIS — K219 Gastro-esophageal reflux disease without esophagitis: Secondary | ICD-10-CM | POA: Diagnosis not present

## 2015-10-16 DIAGNOSIS — I10 Essential (primary) hypertension: Secondary | ICD-10-CM | POA: Diagnosis not present

## 2015-10-16 MED ORDER — HYDROCHLOROTHIAZIDE 12.5 MG PO TABS: 12.5000 mg | ORAL_TABLET | Freq: Every day | ORAL | Status: DC

## 2015-10-16 MED ORDER — PANTOPRAZOLE SODIUM 40 MG PO TBEC: 40.0000 mg | DELAYED_RELEASE_TABLET | Freq: Every day | ORAL | Status: DC | PRN

## 2015-10-16 NOTE — Patient Instructions (Addendum)
Limit your sodium (Salt) intake  Please check your blood pressure on a regular basis.  If it is consistently greater than 150/90, please make an office appointment.    It is important that you exercise regularly, at least 20 minutes 3 to 4 times per week.  If you develop chest pain or shortness of breath seek  medical attention.  Return in one year for follow-up   Health Maintenance, Male A healthy lifestyle and preventative care can promote health and wellness.  Maintain regular health, dental, and eye exams.  Eat a healthy diet. Foods like vegetables, fruits, whole grains, low-fat dairy products, and lean protein foods contain the nutrients you need and are low in calories. Decrease your intake of foods high in solid fats, added sugars, and salt. Get information about a proper diet from your health care provider, if necessary.  Regular physical exercise is one of the most important things you can do for your health. Most adults should get at least 150 minutes of moderate-intensity exercise (any activity that increases your heart rate and causes you to sweat) each week. In addition, most adults need muscle-strengthening exercises on 2 or more days a week.   Maintain a healthy weight. The body mass index (BMI) is a screening tool to identify possible weight problems. It provides an estimate of body fat based on height and weight. Your health care provider can find your BMI and can help you achieve or maintain a healthy weight. For males 20 years and older:  A BMI below 18.5 is considered underweight.  A BMI of 18.5 to 24.9 is normal.  A BMI of 25 to 29.9 is considered overweight.  A BMI of 30 and above is considered obese.  Maintain normal blood lipids and cholesterol by exercising and minimizing your intake of saturated fat. Eat a balanced diet with plenty of fruits and vegetables. Blood tests for lipids and cholesterol should begin at age 20 and be repeated every 5 years. If your lipid  or cholesterol levels are high, you are over age 50, or you are at high risk for heart disease, you may need your cholesterol levels checked more frequently.Ongoing high lipid and cholesterol levels should be treated with medicines if diet and exercise are not working.  If you smoke, find out from your health care provider how to quit. If you do not use tobacco, do not start.  Lung cancer screening is recommended for adults aged 55-80 years who are at high risk for developing lung cancer because of a history of smoking. A yearly low-dose CT scan of the lungs is recommended for people who have at least a 30-pack-year history of smoking and are current smokers or have quit within the past 15 years. A pack year of smoking is smoking an average of 1 pack of cigarettes a day for 1 year (for example, a 30-pack-year history of smoking could mean smoking 1 pack a day for 30 years or 2 packs a day for 15 years). Yearly screening should continue until the smoker has stopped smoking for at least 15 years. Yearly screening should be stopped for people who develop a health problem that would prevent them from having lung cancer treatment.  If you choose to drink alcohol, do not have more than 2 drinks per day. One drink is considered to be 12 oz (360 mL) of beer, 5 oz (150 mL) of wine, or 1.5 oz (45 mL) of liquor.  Avoid the use of street drugs. Do not   share needles with anyone. Ask for help if you need support or instructions about stopping the use of drugs.  High blood pressure causes heart disease and increases the risk of stroke. High blood pressure is more likely to develop in:  People who have blood pressure in the end of the normal range (100-139/85-89 mm Hg).  People who are overweight or obese.  People who are African American.  If you are 18-39 years of age, have your blood pressure checked every 3-5 years. If you are 40 years of age or older, have your blood pressure checked every year. You should  have your blood pressure measured twice--once when you are at a hospital or clinic, and once when you are not at a hospital or clinic. Record the average of the two measurements. To check your blood pressure when you are not at a hospital or clinic, you can use:  An automated blood pressure machine at a pharmacy.  A home blood pressure monitor.  If you are 45-79 years old, ask your health care provider if you should take aspirin to prevent heart disease.  Diabetes screening involves taking a blood sample to check your fasting blood sugar level. This should be done once every 3 years after age 45 if you are at a normal weight and without risk factors for diabetes. Testing should be considered at a younger age or be carried out more frequently if you are overweight and have at least 1 risk factor for diabetes.  Colorectal cancer can be detected and often prevented. Most routine colorectal cancer screening begins at the age of 50 and continues through age 75. However, your health care provider may recommend screening at an earlier age if you have risk factors for colon cancer. On a yearly basis, your health care provider may provide home test kits to check for hidden blood in the stool. A small camera at the end of a tube may be used to directly examine the colon (sigmoidoscopy or colonoscopy) to detect the earliest forms of colorectal cancer. Talk to your health care provider about this at age 50 when routine screening begins. A direct exam of the colon should be repeated every 5-10 years through age 75, unless early forms of precancerous polyps or small growths are found.  People who are at an increased risk for hepatitis B should be screened for this virus. You are considered at high risk for hepatitis B if:  You were born in a country where hepatitis B occurs often. Talk with your health care provider about which countries are considered high risk.  Your parents were born in a high-risk country and  you have not received a shot to protect against hepatitis B (hepatitis B vaccine).  You have HIV or AIDS.  You use needles to inject street drugs.  You live with, or have sex with, someone who has hepatitis B.  You are a man who has sex with other men (MSM).  You get hemodialysis treatment.  You take certain medicines for conditions like cancer, organ transplantation, and autoimmune conditions.  Hepatitis C blood testing is recommended for all people born from 1945 through 1965 and any individual with known risk factors for hepatitis C.  Healthy men should no longer receive prostate-specific antigen (PSA) blood tests as part of routine cancer screening. Talk to your health care provider about prostate cancer screening.  Testicular cancer screening is not recommended for adolescents or adult males who have no symptoms. Screening includes self-exam, a health   care provider exam, and other screening tests. Consult with your health care provider about any symptoms you have or any concerns you have about testicular cancer.  Practice safe sex. Use condoms and avoid high-risk sexual practices to reduce the spread of sexually transmitted infections (STIs).  You should be screened for STIs, including gonorrhea and chlamydia if:  You are sexually active and are younger than 24 years.  You are older than 24 years, and your health care provider tells you that you are at risk for this type of infection.  Your sexual activity has changed since you were last screened, and you are at an increased risk for chlamydia or gonorrhea. Ask your health care provider if you are at risk.  If you are at risk of being infected with HIV, it is recommended that you take a prescription medicine daily to prevent HIV infection. This is called pre-exposure prophylaxis (PrEP). You are considered at risk if:  You are a man who has sex with other men (MSM).  You are a heterosexual man who is sexually active with  multiple partners.  You take drugs by injection.  You are sexually active with a partner who has HIV.  Talk with your health care provider about whether you are at high risk of being infected with HIV. If you choose to begin PrEP, you should first be tested for HIV. You should then be tested every 3 months for as long as you are taking PrEP.  Use sunscreen. Apply sunscreen liberally and repeatedly throughout the day. You should seek shade when your shadow is shorter than you. Protect yourself by wearing long sleeves, pants, a wide-brimmed hat, and sunglasses year round whenever you are outdoors.  Tell your health care provider of new moles or changes in moles, especially if there is a change in shape or color. Also, tell your health care provider if a mole is larger than the size of a pencil eraser.  A one-time screening for abdominal aortic aneurysm (AAA) and surgical repair of large AAAs by ultrasound is recommended for men aged 65-75 years who are current or former smokers.  Stay current with your vaccines (immunizations).   This information is not intended to replace advice given to you by your health care provider. Make sure you discuss any questions you have with your health care provider.   Document Released: 04/18/2008 Document Revised: 11/11/2014 Document Reviewed: 03/18/2011 Elsevier Interactive Patient Education 2016 [employer]  

## 2015-10-16 NOTE — Progress Notes (Signed)
Pre visit review using our clinic review tool, if applicable. No additional management support is needed unless otherwise documented below in the visit note. 

## 2015-10-16 NOTE — Progress Notes (Signed)
Patient ID: Alex Dunn, male   DOB: Apr 15, 1936, 79 y.o.   MRN: [id number]  Subjective:    Patient ID: Alex Dunn, male    DOB: 10/02/1936, 79 y.o.   MRN: [id number]  Knee Pain  Pertinent negatives include no numbness.  Hypertension Pertinent negatives include no chest pain, headaches or palpitations.   79   year-old patient who is in today for followup of hypertension and for a preventive health examination.  He remains on Protonix for GERD which has been stable.  The patient has been on triple antihypertensive medications in the past, but now is on hydrochlorothiazide only.  He continues to do well  He was seen recently for left medial knee pain that has resolved.  In general doing well Last screening colonoscopy was about 10 years ago  Past Medical History  Diagnosis Date  . GERD 06/23/2007  . BENIGN PROSTATIC HYPERTROPHY 06/23/2007  . ANEMIA-IRON DEFICIENCY 06/23/2007  . Hypertension     Social History   Social History  . Marital Status: Married    Spouse Name: N/A  . Number of Children: N/A  . Years of Education: N/A   Occupational History  . Not on file.   Social History Main Topics  . Smoking status: Never Smoker   . Smokeless tobacco: Never Used  . Alcohol Use: No  . Drug Use: No  . Sexual Activity: Not on file   Other Topics Concern  . Not on file   Social History Narrative    Past Surgical History  Procedure Laterality Date  . Hand surgery      Family History  Problem Relation Age of Onset  . Stroke Brother   . Cancer Brother     prostate ca    Allergies  Allergen Reactions  . Codeine Phosphate     Current Outpatient Prescriptions on File Prior to Visit  Medication Sig Dispense Refill  . hydrochlorothiazide (HYDRODIURIL) 12.5 MG tablet Take 1 tablet (12.5 mg total) by mouth daily. 90 tablet 3  . pantoprazole (PROTONIX) 40 MG tablet Take 1 tablet (40 mg total) by mouth daily as needed. 90 tablet 3  . acetaminophen (TYLENOL) 500 MG  tablet Take 500 mg by mouth every 6 (six) hours as needed.    . meloxicam (MOBIC) 15 MG tablet Take 1 tablet (15 mg total) by mouth daily. (Patient not taking: Reported on 10/16/2015) 30 tablet 0   No current facility-administered medications on file prior to visit.    BP 140/78 mmHg  Pulse 81  Temp(Src) 98.4 F (36.9 C) (Oral)  Ht 5' 11.5" (1.816 m)  Wt 203 lb (92.08 kg)  BMI 27.92 kg/m2  SpO2 97%   He was evaluated for iron deficiency anemia in 2003 no colonoscopy since that time.  Current Allergies:  ! CODEINE PHOSPHATE (CODEINE PHOSPHATE)   Past Surgical History:  Hand surgery 1994  history of spontaneous pneumothorax in 1957   Family History:  father died att 65 from an MI. 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 Risk factors, based on past  M,S,F history-  cardiovascular risk factors include family history of heart disease and hypertension  2.  Physical activities: Fairly sedentary but remains active. No regular exercise program  3.  Depression/mood: No history depression or mood disorder  4.  Hearing: No deficits  5.  ADL's: Independent in all  aspects of daily living. Still works part-time for [name]  6.  Fall risk: Low  7.  Home safety: No problems identified  8.  Height weight, and visual acuity; height and weight stable no change in visual acuity  9.  Counseling: Colonoscopy will regular exercise encouraged  10. Lab orders based on risk factors: Laboratory screen discussed  11. Referral : Colonoscopy 12. Care plan: Colonoscopy more regular exercise heart healthy diet all encouraged  13. Cognitive assessment: Alert and oriented with normal affect. No cognitive dysfunction.  14.  Preventive services will include an annual health assessment with routine lab.  Annual eye examinations recommended.  Patient was provided with a written and  personalized care plan  15.  Provider list includes primary care ophthalmology and radiology      Review of Systems  Constitutional: Negative for fever, chills, appetite change and fatigue.  HENT: Negative for congestion, dental problem, ear pain, hearing loss, sore throat, tinnitus, trouble swallowing and voice change.   Eyes: Negative for pain, discharge and visual disturbance.  Respiratory: Negative for cough, chest tightness, wheezing and stridor.   Cardiovascular: Negative for chest pain, palpitations and leg swelling.  Gastrointestinal: Negative for nausea, vomiting, abdominal pain, diarrhea, constipation, blood in stool and abdominal distention.  Genitourinary: Negative for urgency, hematuria, flank pain, discharge, difficulty urinating and genital sores.  Musculoskeletal: Negative for myalgias, back pain, joint swelling, arthralgias, gait problem and neck stiffness.  Skin: Negative for rash.  Neurological: Negative for dizziness, syncope, speech difficulty, weakness, numbness and headaches.  Hematological: Negative for adenopathy. Does not bruise/bleed easily.  Psychiatric/Behavioral: Negative for behavioral problems and dysphoric mood. The patient is not nervous/anxious.        Objective:   Physical Exam  Constitutional: He appears well-developed and well-nourished.  HENT:  Head: Normocephalic and atraumatic.  Right Ear: External ear normal.  Left Ear: External ear normal.  Nose: Nose normal.  Mouth/Throat: Oropharynx is clear and moist.  Eyes: Conjunctivae and EOM are normal. Pupils are equal, round, and reactive to light. No scleral icterus.  Neck: Normal range of motion. Neck supple. No JVD present. No thyromegaly present.  Cardiovascular: Regular rhythm, normal heart sounds and intact distal pulses.  Exam reveals no gallop and no friction rub.   No murmur heard. Dorsalis pedis pulses full.  Posterior tibial pulses faint  Pulmonary/Chest: Effort normal and breath  sounds normal. He exhibits no tenderness.  Abdominal: Soft. Bowel sounds are normal. He exhibits no distension and no mass. There is no tenderness.  Genitourinary: Penis normal. Guaiac negative stool.  Uncircumcised Prostate enlarged  Musculoskeletal: Normal range of motion. He exhibits no edema or tenderness.  Lymphadenopathy:    He has no cervical adenopathy.  Neurological: He is alert. He has normal reflexes. No cranial nerve deficit. Coordination normal.  Skin: Skin is warm and dry. No rash noted.  Psychiatric: He has a normal mood and affect. His behavior is normal.          Assessment & Plan:   Preventive health examination.  Follow-up colonoscopy discussed.  Patient wishes to consider diagnostic studies only Hypertension stable. We'll continue hydrochlorothiazide only.  Continue close home blood pressure monitoring Gastroesophageal reflux disease stable antireflux regimen encouraged Chronic kidney disease, stable  Low-salt diet recommended

## 2016-04-08 DIAGNOSIS — D3131 Benign neoplasm of right choroid: Secondary | ICD-10-CM | POA: Diagnosis not present

## 2016-04-08 DIAGNOSIS — H353121 Nonexudative age-related macular degeneration, left eye, early dry stage: Secondary | ICD-10-CM | POA: Diagnosis not present

## 2016-04-08 DIAGNOSIS — H25813 Combined forms of age-related cataract, bilateral: Secondary | ICD-10-CM | POA: Diagnosis not present

## 2016-07-02 ENCOUNTER — Ambulatory Visit (INDEPENDENT_AMBULATORY_CARE_PROVIDER_SITE_OTHER): Payer: Medicare Other | Admitting: Internal Medicine

## 2016-07-02 ENCOUNTER — Encounter: Payer: Self-pay | Admitting: Internal Medicine

## 2016-07-02 VITALS — BP 138/80 | HR 73 | Temp 98.2°F | Resp 20 | Ht 71.5 in | Wt 198.0 lb

## 2016-07-02 DIAGNOSIS — R51 Headache: Secondary | ICD-10-CM | POA: Diagnosis not present

## 2016-07-02 DIAGNOSIS — H6121 Impacted cerumen, right ear: Secondary | ICD-10-CM | POA: Diagnosis not present

## 2016-07-02 DIAGNOSIS — R519 Headache, unspecified: Secondary | ICD-10-CM

## 2016-07-02 DIAGNOSIS — I10 Essential (primary) hypertension: Secondary | ICD-10-CM

## 2016-07-02 MED ORDER — PREDNISONE 10 MG PO TABS: 10.0000 mg | ORAL_TABLET | Freq: Two times a day (BID) | ORAL | 0 refills | Status: DC

## 2016-07-02 MED ORDER — FLUTICASONE PROPIONATE 50 MCG/ACT NA SUSP: 2.0000 | Freq: Every day | NASAL | 6 refills | Status: DC

## 2016-07-02 NOTE — Progress Notes (Signed)
Pre visit review using our clinic review tool, if applicable. No additional management support is needed unless otherwise documented below in the visit note. 

## 2016-07-02 NOTE — Progress Notes (Signed)
Subjective:    Patient ID: Alex Dunn, male    DOB: May 08, 1936, 80 y.o.   MRN: [id number]  HPI 80 year old patient who has essential hypertension.  He presents with a two-month history of posterior neck discomfort, sinus pressure and decreased auditory acuity.  Does describe of frequent sneezing, but not much in the way of rhinorrhea.  No fever, no purulent sinus drainage or dental pain  Past Medical History:  Diagnosis Date  . ANEMIA-IRON DEFICIENCY 06/23/2007  . BENIGN PROSTATIC HYPERTROPHY 06/23/2007  . GERD 06/23/2007  . Hypertension      Social History   Social History  . Marital status: Married    Spouse name: N/A  . Number of children: N/A  . Years of education: N/A   Occupational History  . Not on file.   Social History Main Topics  . Smoking status: Never Smoker  . Smokeless tobacco: Never Used  . Alcohol use No  . Drug use: No  . Sexual activity: Not on file   Other Topics Concern  . Not on file   Social History Narrative  . No narrative on file    Past Surgical History:  Procedure Laterality Date  . HAND SURGERY      Family History  Problem Relation Age of Onset  . Stroke Brother   . Cancer Brother     prostate ca    Allergies  Allergen Reactions  . Codeine Phosphate     Current Outpatient Prescriptions on File Prior to Visit  Medication Sig Dispense Refill  . acetaminophen (TYLENOL) 500 MG tablet Take 500 mg by mouth every 6 (six) hours as needed.    . hydrochlorothiazide (HYDRODIURIL) 12.5 MG tablet Take 1 tablet (12.5 mg total) by mouth daily. 90 tablet 3  . pantoprazole (PROTONIX) 40 MG tablet Take 1 tablet (40 mg total) by mouth daily as needed. 90 tablet 3   No current facility-administered medications on file prior to visit.     BP 138/80 (BP Location: Left Arm, Patient Position: Sitting, Cuff Size: Normal)   Pulse 73   Temp 98.2 F (36.8 C) (Oral)   Resp 20   Ht 5' 11.5" (1.816 m)   Wt 198 lb (89.8 kg)   SpO2 98%   BMI  27.23 kg/m      Review of Systems  Constitutional: Negative for appetite change, chills, fatigue and fever.  HENT: Positive for congestion, hearing loss and sinus pressure. Negative for dental problem, ear pain, sore throat, tinnitus, trouble swallowing and voice change.   Eyes: Negative for pain, discharge and visual disturbance.  Respiratory: Negative for cough, chest tightness, wheezing and stridor.   Cardiovascular: Negative for chest pain, palpitations and leg swelling.  Gastrointestinal: Negative for abdominal distention, abdominal pain, blood in stool, constipation, diarrhea, nausea and vomiting.  Genitourinary: Negative for difficulty urinating, discharge, flank pain, genital sores, hematuria and urgency.  Musculoskeletal: Negative for arthralgias, back pain, gait problem, joint swelling, myalgias and neck stiffness.  Skin: Negative for rash.  Neurological: Positive for headaches. Negative for dizziness, syncope, speech difficulty, weakness and numbness.  Hematological: Negative for adenopathy. Does not bruise/bleed easily.  Psychiatric/Behavioral: Negative for behavioral problems and dysphoric mood. The patient is not nervous/anxious.        Objective:   Physical Exam  Constitutional: He is oriented to person, place, and time. He appears well-developed.  HENT:  Head: Normocephalic.  Right Ear: External ear normal.  Left Ear: External ear normal.  Cerumen.  Right canal only  Weber does not lateralize  No focal sinus tenderness  Eyes: Conjunctivae and EOM are normal.  Neck: Normal range of motion.  Cardiovascular: Normal rate and normal heart sounds.   Pulmonary/Chest: Breath sounds normal.  Abdominal: Bowel sounds are normal.  Musculoskeletal: Normal range of motion. He exhibits no edema or tenderness.  Neurological: He is alert and oriented to person, place, and time.  Psychiatric: He has a normal mood and affect. His behavior is normal.          Assessment &  Plan:   Nonspecific headaches Cerumen impaction.  Right canal.  Irrigated until clear Sinus congestion  Will give a trial of fluticasone nasal spray and a brief the treatment of oral prednisone.  We'll place on a antihistamine Return when necessary CPX as scheduled  Nyoka Cowden

## 2016-07-02 NOTE — Patient Instructions (Signed)
He is a nonsedating antihistamine such as Allegra or Claritin once daily    Use saline irrigation, warm  moist compresses and over-the-counter decongestants only as directed.  Call if there is no improvement in 5 to 7 days, or sooner if you develop increasing pain, fever, or any new symptoms.

## 2016-08-21 ENCOUNTER — Ambulatory Visit (INDEPENDENT_AMBULATORY_CARE_PROVIDER_SITE_OTHER): Payer: Medicare Other

## 2016-08-21 DIAGNOSIS — Z23 Encounter for immunization: Secondary | ICD-10-CM | POA: Diagnosis not present

## 2016-10-11 ENCOUNTER — Other Ambulatory Visit (INDEPENDENT_AMBULATORY_CARE_PROVIDER_SITE_OTHER): Payer: Medicare Other

## 2016-10-11 DIAGNOSIS — Z0000 Encounter for general adult medical examination without abnormal findings: Secondary | ICD-10-CM

## 2016-10-11 LAB — CBC WITH DIFFERENTIAL/PLATELET
BASOS PCT: 0.6 % (ref 0.0–3.0)
Basophils Absolute: 0 10*3/uL (ref 0.0–0.1)
EOS ABS: 0.1 10*3/uL (ref 0.0–0.7)
EOS PCT: 2.2 % (ref 0.0–5.0)
HEMATOCRIT: 43.9 % (ref 39.0–52.0)
HEMOGLOBIN: 14.8 g/dL (ref 13.0–17.0)
LYMPHS PCT: 22.6 % (ref 12.0–46.0)
Lymphs Abs: 1.3 10*3/uL (ref 0.7–4.0)
MCHC: 33.6 g/dL (ref 30.0–36.0)
MCV: 94.4 fl (ref 78.0–100.0)
MONO ABS: 0.5 10*3/uL (ref 0.1–1.0)
Monocytes Relative: 8.8 % (ref 3.0–12.0)
Neutro Abs: 3.7 10*3/uL (ref 1.4–7.7)
Neutrophils Relative %: 65.8 % (ref 43.0–77.0)
Platelets: 196 10*3/uL (ref 150.0–400.0)
RBC: 4.65 Mil/uL (ref 4.22–5.81)
RDW: 13.7 % (ref 11.5–15.5)
WBC: 5.7 10*3/uL (ref 4.0–10.5)

## 2016-10-11 LAB — POC URINALSYSI DIPSTICK (AUTOMATED)
Bilirubin, UA: NEGATIVE
Glucose, UA: NEGATIVE
KETONES UA: NEGATIVE
Leukocytes, UA: NEGATIVE
Nitrite, UA: NEGATIVE
PH UA: 6.5
Protein, UA: NEGATIVE
RBC UA: NEGATIVE
Spec Grav, UA: 1.015
UROBILINOGEN UA: 0.2

## 2016-10-11 LAB — LIPID PANEL
CHOL/HDL RATIO: 5
Cholesterol: 183 mg/dL (ref 0–200)
HDL: 38.4 mg/dL — ABNORMAL LOW (ref >39.00)
LDL CALC: 126 mg/dL — AB (ref 0–99)
NONHDL: 144.86
TRIGLYCERIDES: 92 mg/dL (ref 0.0–149.0)
VLDL: 18.4 mg/dL (ref 0.0–40.0)

## 2016-10-11 LAB — BASIC METABOLIC PANEL
BUN: 25 mg/dL — ABNORMAL HIGH (ref 6–23)
CHLORIDE: 105 meq/L (ref 96–112)
CO2: 31 mEq/L (ref 19–32)
Calcium: 8.9 mg/dL (ref 8.4–10.5)
Creatinine, Ser: 1.58 mg/dL — ABNORMAL HIGH (ref 0.40–1.50)
GFR: 45.26 mL/min — AB (ref >60.00)
Glucose, Bld: 97 mg/dL (ref 70–99)
POTASSIUM: 4.3 meq/L (ref 3.5–5.1)
Sodium: 143 mEq/L (ref 135–145)

## 2016-10-11 LAB — HEPATIC FUNCTION PANEL
ALT: 12 U/L (ref 0–53)
AST: 16 U/L (ref 0–37)
Albumin: 3.7 g/dL (ref 3.5–5.2)
Alkaline Phosphatase: 79 U/L (ref 39–117)
BILIRUBIN DIRECT: 0.1 mg/dL (ref 0.0–0.3)
BILIRUBIN TOTAL: 0.6 mg/dL (ref 0.2–1.2)
TOTAL PROTEIN: 6.7 g/dL (ref 6.0–8.3)

## 2016-10-11 LAB — TSH: TSH: 2.18 u[IU]/mL (ref 0.35–4.50)

## 2016-10-18 ENCOUNTER — Ambulatory Visit (INDEPENDENT_AMBULATORY_CARE_PROVIDER_SITE_OTHER): Payer: Medicare Other | Admitting: Internal Medicine

## 2016-10-18 ENCOUNTER — Encounter: Payer: Self-pay | Admitting: Internal Medicine

## 2016-10-18 DIAGNOSIS — N183 Chronic kidney disease, stage 3 unspecified: Secondary | ICD-10-CM

## 2016-10-18 DIAGNOSIS — N189 Chronic kidney disease, unspecified: Secondary | ICD-10-CM

## 2016-10-18 DIAGNOSIS — I10 Essential (primary) hypertension: Secondary | ICD-10-CM

## 2016-10-18 DIAGNOSIS — Z0000 Encounter for general adult medical examination without abnormal findings: Secondary | ICD-10-CM

## 2016-10-18 HISTORY — DX: Chronic kidney disease, unspecified: N18.9

## 2016-10-18 MED ORDER — ASPIRIN EC 81 MG PO TBEC: 81.0000 mg | DELAYED_RELEASE_TABLET | Freq: Every day | ORAL | Status: DC

## 2016-10-18 NOTE — Progress Notes (Signed)
Pre visit review using our clinic review tool, if applicable. No additional management support is needed unless otherwise documented below in the visit note. 

## 2016-10-18 NOTE — Progress Notes (Signed)
Subjective:    Patient ID: Alex Dunn, male    DOB: [birth year], 80 y.o.   MRN: [id number]  HPI  80 year old patient who is seen today for a preventive exam as well as annual Medicare wellness visit He continues to do quite well with a history of essential hypertension, controlled on diuretic therapy.  He does have a history of rare reflux symptoms and does take PPI.  Rarely, perhaps every 2 months.  His only complaint is posterior neck pain, more on the left side which has been a chronic complaint He has history mild BPH. He has chronic kidney disease which has been stable.  Past Medical History:  Diagnosis Date  . ANEMIA-IRON DEFICIENCY 06/23/2007  . BENIGN PROSTATIC HYPERTROPHY 06/23/2007  . GERD 06/23/2007  . Hypertension      Social History   Social History  . Marital status: Married    Spouse name: N/A  . Number of children: N/A  . Years of education: N/A   Occupational History  . Not on file.   Social History Main Topics  . Smoking status: Never Smoker  . Smokeless tobacco: Never Used  . Alcohol use No  . Drug use: No  . Sexual activity: Not on file   Other Topics Concern  . Not on file   Social History Narrative  . No narrative on file    Past Surgical History:  Procedure Laterality Date  . HAND SURGERY      Family History  Problem Relation Age of Onset  . Stroke Brother   . Cancer Brother     prostate ca    Allergies  Allergen Reactions  . Codeine Phosphate     Current Outpatient Prescriptions on File Prior to Visit  Medication Sig Dispense Refill  . hydrochlorothiazide (HYDRODIURIL) 12.5 MG tablet Take 1 tablet (12.5 mg total) by mouth daily. 90 tablet 3  . pantoprazole (PROTONIX) 40 MG tablet Take 1 tablet (40 mg total) by mouth daily as needed. 90 tablet 3   No current facility-administered medications on file prior to visit.     There were no vitals taken for this visit.  Medicare wellness visit  1. Risk factors, based on past   M,S,F history.  Current vascular risk factors include a history of hypertension, age and male sex  2.  Physical activities: remains fairly active with work around the house and also his church.  No exercise limitations.  Still works part-me with [employer]  3.  Depression/mood:no history of major depression or mood disorder  4.  Hearing:no major deficits  5.  ADL's:independent  6.  Fall risk:low  7.  Home safety:no problems identified  8.  Height weight, and visual acuity;eight and weight stable no change in visual acuity has had an eye exam earlier today  9.  Counseling:daily low-dose aspirin.  Encouraged  10. Lab orders based on risk factors:laboratory profile reviewed  11. Referral :not appropriate at this time  12. Care plan:continue heart healthy diet;  more rigorous exercise activities.  Encouraged  13. Cognitive assessment: alert and appropriate with normal affect.  No cognitive dysfunction  14. Screening: Patient provided with a written and personalized 5-10 year screening schedule in the AVS.    15. Provider List Update: primary care ophthalmology    Review of Systems  Constitutional: Negative for activity change, appetite change, chills, fatigue and fever.  HENT: Negative for congestion, dental problem, ear pain, hearing loss, mouth sores, rhinorrhea, sinus pressure, sneezing, tinnitus, trouble swallowing  and voice change.   Eyes: Negative for photophobia, pain, redness and visual disturbance.  Respiratory: Negative for apnea, cough, choking, chest tightness, shortness of breath and wheezing.   Cardiovascular: Negative for chest pain, palpitations and leg swelling.  Gastrointestinal: Negative for abdominal distention, abdominal pain, anal bleeding, blood in stool, constipation, diarrhea, nausea, rectal pain and vomiting.  Genitourinary: Negative for decreased urine volume, difficulty urinating, discharge, dysuria, flank pain, frequency, genital sores, hematuria,  penile swelling, scrotal swelling, testicular pain and urgency.  Musculoskeletal: Positive for neck pain and neck stiffness. Negative for arthralgias, back pain, gait problem, joint swelling and myalgias.  Skin: Negative for color change, rash and wound.  Neurological: Negative for dizziness, tremors, seizures, syncope, facial asymmetry, speech difficulty, weakness, light-headedness, numbness and headaches.  Hematological: Negative for adenopathy. Does not bruise/bleed easily.  Psychiatric/Behavioral: Negative for agitation, behavioral problems, confusion, decreased concentration, dysphoric mood, hallucinations, self-injury, sleep disturbance and suicidal ideas. The patient is not nervous/anxious.        Objective:   Physical Exam  Constitutional: He appears well-developed and well-nourished.  Blood pressure 130/80  HENT:  Head: Normocephalic and atraumatic.  Right Ear: External ear normal.  Left Ear: External ear normal.  Nose: Nose normal.  Mouth/Throat: Oropharynx is clear and moist.  Pharyngeal crowding  Eyes: Conjunctivae and EOM are normal. Pupils are equal, round, and reactive to light. No scleral icterus.  Neck: Normal range of motion. Neck supple. No JVD present. No thyromegaly present.  Cardiovascular: Regular rhythm, normal heart sounds and intact distal pulses.  Exam reveals no gallop and no friction rub.   No murmur heard. Dorsalis pedis pulses full.  Posterior tibial pulses faint  Pulmonary/Chest: Effort normal and breath sounds normal. He exhibits no tenderness.  Abdominal: Soft. Bowel sounds are normal. He exhibits no distension and no mass. There is no tenderness.  Genitourinary: Penis normal. Rectal exam shows guaiac negative stool.  Genitourinary Comments: Plus 2 enlarged  Musculoskeletal: Normal range of motion. He exhibits no edema or tenderness.  Lymphadenopathy:    He has no cervical adenopathy.  Neurological: He is alert. He has normal reflexes. No cranial  nerve deficit. Coordination normal.  Skin: Skin is warm and dry. No rash noted.  Psychiatric: He has a normal mood and affect. His behavior is normal.          Assessment & Plan:   Preventive health examination.  Start aspirin 81 mg daily Medicare wellness visit Essential hypertension, controlled Gastroesophageal reflux disease.  Continue when necessary Protonix  Follow-up one year or as needed  [employer]

## 2016-10-18 NOTE — Patient Instructions (Addendum)
Limit your sodium (Salt) intake  Please check your blood pressure on a regular basis.  If it is consistently greater than 150/90, please make an office appointment.    It is important that you exercise regularly, at least 20 minutes 3 to 4 times per week.  If you develop chest pain or shortness of breath seek  medical attention.  Take an aspirin 81 mg daily  Return in 6-12 for follow-up

## 2016-11-22 DIAGNOSIS — M542 Cervicalgia: Secondary | ICD-10-CM | POA: Diagnosis not present

## 2016-11-22 DIAGNOSIS — M4682 Other specified inflammatory spondylopathies, cervical region: Secondary | ICD-10-CM | POA: Diagnosis not present

## 2016-11-28 DIAGNOSIS — M542 Cervicalgia: Secondary | ICD-10-CM | POA: Diagnosis not present

## 2016-12-05 DIAGNOSIS — M542 Cervicalgia: Secondary | ICD-10-CM | POA: Diagnosis not present

## 2016-12-12 DIAGNOSIS — M542 Cervicalgia: Secondary | ICD-10-CM | POA: Diagnosis not present

## 2016-12-19 DIAGNOSIS — M542 Cervicalgia: Secondary | ICD-10-CM | POA: Diagnosis not present

## 2016-12-26 DIAGNOSIS — M542 Cervicalgia: Secondary | ICD-10-CM | POA: Diagnosis not present

## 2017-01-03 DIAGNOSIS — M542 Cervicalgia: Secondary | ICD-10-CM | POA: Diagnosis not present

## 2017-01-03 DIAGNOSIS — M47812 Spondylosis without myelopathy or radiculopathy, cervical region: Secondary | ICD-10-CM | POA: Diagnosis not present

## 2017-01-17 ENCOUNTER — Other Ambulatory Visit: Payer: Self-pay | Admitting: Internal Medicine

## 2017-04-20 ENCOUNTER — Other Ambulatory Visit: Payer: Self-pay | Admitting: Internal Medicine

## 2017-05-01 ENCOUNTER — Emergency Department (HOSPITAL_BASED_OUTPATIENT_CLINIC_OR_DEPARTMENT_OTHER)
Admission: EM | Admit: 2017-05-01 | Discharge: 2017-05-01 | Disposition: A | Discharge status: Home / Self Care | Payer: Medicare Other | Attending: Emergency Medicine | Admitting: Emergency Medicine

## 2017-05-01 ENCOUNTER — Encounter (HOSPITAL_BASED_OUTPATIENT_CLINIC_OR_DEPARTMENT_OTHER): Payer: Self-pay | Admitting: Emergency Medicine

## 2017-05-01 DIAGNOSIS — K529 Noninfective gastroenteritis and colitis, unspecified: Secondary | ICD-10-CM | POA: Insufficient documentation

## 2017-05-01 DIAGNOSIS — I129 Hypertensive chronic kidney disease with stage 1 through stage 4 chronic kidney disease, or unspecified chronic kidney disease: Secondary | ICD-10-CM | POA: Diagnosis not present

## 2017-05-01 DIAGNOSIS — R197 Diarrhea, unspecified: Secondary | ICD-10-CM | POA: Diagnosis present

## 2017-05-01 DIAGNOSIS — Z7982 Long term (current) use of aspirin: Secondary | ICD-10-CM | POA: Diagnosis not present

## 2017-05-01 DIAGNOSIS — N189 Chronic kidney disease, unspecified: Secondary | ICD-10-CM | POA: Diagnosis not present

## 2017-05-01 LAB — URINALYSIS, ROUTINE W REFLEX MICROSCOPIC
BILIRUBIN URINE: NEGATIVE
GLUCOSE, UA: NEGATIVE mg/dL
HGB URINE DIPSTICK: NEGATIVE
KETONES UR: NEGATIVE mg/dL
Leukocytes, UA: NEGATIVE
Nitrite: NEGATIVE
PH: 5.5 (ref 5.0–8.0)
Protein, ur: NEGATIVE mg/dL
Specific Gravity, Urine: 1.014 (ref 1.005–1.030)

## 2017-05-01 LAB — CBC
HEMATOCRIT: 46.3 % (ref 39.0–52.0)
Hemoglobin: 15.7 g/dL (ref 13.0–17.0)
MCH: 32.1 pg (ref 26.0–34.0)
MCHC: 33.9 g/dL (ref 30.0–36.0)
MCV: 94.7 fL (ref 78.0–100.0)
Platelets: 154 10*3/uL (ref 150–400)
RBC: 4.89 MIL/uL (ref 4.22–5.81)
RDW: 13.4 % (ref 11.5–15.5)
WBC: 4.7 10*3/uL (ref 4.0–10.5)

## 2017-05-01 LAB — COMPREHENSIVE METABOLIC PANEL
ALT: 16 U/L — AB (ref 17–63)
AST: 19 U/L (ref 15–41)
Albumin: 3.5 g/dL (ref 3.5–5.0)
Alkaline Phosphatase: 62 U/L (ref 38–126)
Anion gap: 9 (ref 5–15)
BILIRUBIN TOTAL: 0.4 mg/dL (ref 0.3–1.2)
BUN: 21 mg/dL — AB (ref 6–20)
CHLORIDE: 102 mmol/L (ref 101–111)
CO2: 28 mmol/L (ref 22–32)
CREATININE: 1.64 mg/dL — AB (ref 0.61–1.24)
Calcium: 8.1 mg/dL — ABNORMAL LOW (ref 8.9–10.3)
GFR calc Af Amer: 45 mL/min — ABNORMAL LOW (ref >60)
GFR calc non Af Amer: 38 mL/min — ABNORMAL LOW (ref >60)
Glucose, Bld: 108 mg/dL — ABNORMAL HIGH (ref 65–99)
POTASSIUM: 3.3 mmol/L — AB (ref 3.5–5.1)
Sodium: 139 mmol/L (ref 135–145)
TOTAL PROTEIN: 6.6 g/dL (ref 6.5–8.1)

## 2017-05-01 LAB — LIPASE, BLOOD: Lipase: 20 U/L (ref 11–51)

## 2017-05-01 MED ORDER — SODIUM CHLORIDE 0.9 % IV BOLUS (SEPSIS)
1000.0000 mL | Freq: Once | INTRAVENOUS | Status: AC
  Administered 2017-05-01: 1000 mL via INTRAVENOUS

## 2017-05-01 MED ORDER — ONDANSETRON HCL 4 MG/2ML IJ SOLN
4.0000 mg | Freq: Once | INTRAMUSCULAR | Status: AC | PRN
  Administered 2017-05-01: 4 mg via INTRAVENOUS
  Filled 2017-05-01: qty 2

## 2017-05-01 MED ORDER — DIPHENOXYLATE-ATROPINE 2.5-0.025 MG PO TABS: 1.0000 | ORAL_TABLET | Freq: Four times a day (QID) | ORAL | 0 refills | Status: DC | PRN

## 2017-05-01 MED ORDER — ONDANSETRON 4 MG PO TBDP: 4.0000 mg | ORAL_TABLET | Freq: Three times a day (TID) | ORAL | 0 refills | Status: DC | PRN

## 2017-05-01 MED ORDER — ONDANSETRON HCL 4 MG/2ML IJ SOLN
4.0000 mg | Freq: Once | INTRAMUSCULAR | Status: AC
  Administered 2017-05-01: 4 mg via INTRAVENOUS
  Filled 2017-05-01: qty 2

## 2017-05-01 NOTE — Discharge Instructions (Signed)
Push fluids to stay hydrated. Return to ER with worsening symptoms, fever, localizing pain, bloody stools, or other worsening.

## 2017-05-01 NOTE — ED Notes (Signed)
Given gingerale for fluid challenge  

## 2017-05-01 NOTE — ED Triage Notes (Signed)
Patient states that he has had N/V/D since yesterday - Last episode of voming was this am. He reports that he has felt weak all day

## 2017-05-01 NOTE — ED Provider Notes (Signed)
[facility] [facility] Provider Note   CSN: [id number] Arrival date & time: 05/01/17  1500     History   Chief Complaint Chief Complaint  Patient presents with  . Diarrhea    HPI Alex Dunn is a 81 y.o. male. Chief complaint is vomiting and diarrhea  HPI 81 year old male. HEENT his wife both had cheese steak sandwiches 2 nights ago, on Tuesday. About midnight that night he awakened with nausea and vomiting. 2 episodes of diarrhea followed. One additional episode of vomiting yesterday morning. No diarrhea or vomiting today. However he continues to feel weak and slightly lightheaded. No fever. No abdominal cramping. No blood in his emesis, or stools. Past medical history includes only hypertension  Past Medical History:  Diagnosis Date  . ANEMIA-IRON DEFICIENCY 06/23/2007  . BENIGN PROSTATIC HYPERTROPHY 06/23/2007  . GERD 06/23/2007  . Hypertension     Patient Active Problem List   Diagnosis Date Noted  . Chronic kidney disease 10/18/2016  . Hypertension 01/17/2011  . ANEMIA-IRON DEFICIENCY 06/23/2007  . GERD 06/23/2007  . BENIGN PROSTATIC HYPERTROPHY 06/23/2007    Past Surgical History:  Procedure Laterality Date  . HAND SURGERY         Home Medications    Prior to Admission medications   Medication Sig Start Date End Date Taking? Authorizing Provider  aspirin EC 81 MG tablet Take 1 tablet (81 mg total) by mouth daily. 10/18/16   Marletta Lor, MD  diphenoxylate-atropine (LOMOTIL) 2.5-0.025 MG tablet Take 1 tablet by mouth 4 (four) times daily as needed for diarrhea or loose stools. 05/01/17   Tanna Furry, MD  hydrochlorothiazide (HYDRODIURIL) 12.5 MG tablet TAKE ONE TABLET BY MOUTH EVERY DAY 04/21/17   Marletta Lor, MD  ondansetron (ZOFRAN ODT) 4 MG disintegrating tablet Take 1 tablet (4 mg total) by mouth every 8 (eight) hours as needed for nausea. 05/01/17   Tanna Furry, MD  pantoprazole (PROTONIX) 40 MG tablet Take 1 tablet (40 mg total) by  mouth daily as needed. 10/16/15   Marletta Lor, MD    Family History Family History  Problem Relation Age of Onset  . Stroke Brother   . Cancer Brother        prostate ca    Social History Social History  Substance Use Topics  . Smoking status: Never Smoker  . Smokeless tobacco: Never Used  . Alcohol use No     Allergies   Codeine phosphate   Review of Systems Review of Systems  Constitutional: Negative for appetite change, chills, diaphoresis, fatigue and fever.  HENT: Negative for mouth sores, sore throat and trouble swallowing.   Eyes: Negative for visual disturbance.  Respiratory: Negative for cough, chest tightness, shortness of breath and wheezing.   Cardiovascular: Negative for chest pain.  Gastrointestinal: Positive for diarrhea, nausea and vomiting. Negative for abdominal distention and abdominal pain.  Endocrine: Negative for polydipsia, polyphagia and polyuria.  Genitourinary: Negative for dysuria, frequency and hematuria.  Musculoskeletal: Negative for gait problem.  Skin: Negative for color change, pallor and rash.  Neurological: Negative for dizziness, syncope, light-headedness and headaches.  Hematological: Does not bruise/bleed easily.  Psychiatric/Behavioral: Negative for behavioral problems and confusion.     Physical Exam Updated Vital Signs BP (!) 145/77   Pulse 68   Temp 98 F (36.7 C) (Oral)   Resp 18   Ht 6' (1.829 m)   Wt 90.7 kg (200 lb)   SpO2 97%   BMI 27.12 kg/m   Physical Exam  Constitutional: He is oriented to person, place, and time. He appears well-developed and well-nourished. No distress.  HENT:  Head: Normocephalic.  Conjunctiva not pale. No scleral icterus. Mucous membranes moist.  Eyes: Conjunctivae are normal. Pupils are equal, round, and reactive to light. No scleral icterus.  Neck: Normal range of motion. Neck supple. No thyromegaly present.  Cardiovascular: Normal rate and regular rhythm.  Exam reveals no  gallop and no friction rub.   No murmur heard. Pulmonary/Chest: Effort normal and breath sounds normal. No respiratory distress. He has no wheezes. He has no rales.  Abdominal: Soft. Bowel sounds are normal. He exhibits no distension. There is no tenderness. There is no rebound.  Soft, nondistended, no focal tenderness.  Musculoskeletal: Normal range of motion.  Neurological: He is alert and oriented to person, place, and time.  Skin: Skin is warm and dry. No rash noted.  Psychiatric: He has a normal mood and affect. His behavior is normal.     ED Treatments / Results  Labs (all labs ordered are listed, but only abnormal results are displayed) Labs Reviewed  COMPREHENSIVE METABOLIC PANEL - Abnormal; Notable for the following:       Result Value   Potassium 3.3 (*)    Glucose, Bld 108 (*)    BUN 21 (*)    Creatinine, Ser 1.64 (*)    Calcium 8.1 (*)    ALT 16 (*)    GFR calc non Af Amer 38 (*)    GFR calc Af Amer 45 (*)    All other components within normal limits  LIPASE, BLOOD  CBC  URINALYSIS, ROUTINE W REFLEX MICROSCOPIC    EKG  EKG Interpretation None       Radiology No results found.  Procedures Procedures (including critical care time)  Medications Ordered in ED Medications  ondansetron (ZOFRAN) injection 4 mg (not administered)  ondansetron (ZOFRAN) injection 4 mg (4 mg Intravenous Given 05/01/17 1653)  sodium chloride 0.9 % bolus 1,000 mL (1,000 mLs Intravenous New Bag/Given 05/01/17 1648)     Initial Impression / Assessment and Plan / ED Course  I have reviewed the triage vital signs and the nursing notes.  Pertinent labs & imaging results that were available during my care of the patient were reviewed by me and considered in my medical decision making (see chart for details).    Likely simple enteritis. We'll check kidney function, cell count, rehydration. We'll reevaluate.  Final Clinical Impressions(s) / ED Diagnoses   Final diagnoses:    Gastroenteritis   Patient has urinated. Nausea has improved. Reassuring labs. Plan discharge home. Rest, push fluids, when necessary Lomotil, Zofran. Return precautions discussed.  New Prescriptions New Prescriptions   DIPHENOXYLATE-ATROPINE (LOMOTIL) 2.5-0.025 MG TABLET    Take 1 tablet by mouth 4 (four) times daily as needed for diarrhea or loose stools.   ONDANSETRON (ZOFRAN ODT) 4 MG DISINTEGRATING TABLET    Take 1 tablet (4 mg total) by mouth every 8 (eight) hours as needed for nausea.     Tanna Furry, MD 05/01/17 [facility]

## 2017-08-19 ENCOUNTER — Other Ambulatory Visit: Payer: Self-pay | Admitting: Internal Medicine

## 2017-08-27 ENCOUNTER — Ambulatory Visit (INDEPENDENT_AMBULATORY_CARE_PROVIDER_SITE_OTHER): Payer: Medicare Other | Admitting: *Deleted

## 2017-08-27 DIAGNOSIS — Z23 Encounter for immunization: Secondary | ICD-10-CM | POA: Diagnosis not present

## 2017-09-02 DIAGNOSIS — S60222A Contusion of left hand, initial encounter: Secondary | ICD-10-CM | POA: Diagnosis not present

## 2017-10-14 ENCOUNTER — Other Ambulatory Visit: Payer: Medicare Other | Admitting: Internal Medicine

## 2017-10-21 ENCOUNTER — Ambulatory Visit (INDEPENDENT_AMBULATORY_CARE_PROVIDER_SITE_OTHER): Payer: Medicare Other | Admitting: Internal Medicine

## 2017-10-21 ENCOUNTER — Encounter: Payer: Self-pay | Admitting: Internal Medicine

## 2017-10-21 VITALS — BP 138/66 | HR 69 | Temp 98.1°F | Ht 72.0 in | Wt 187.8 lb

## 2017-10-21 DIAGNOSIS — I10 Essential (primary) hypertension: Secondary | ICD-10-CM

## 2017-10-21 DIAGNOSIS — N183 Chronic kidney disease, stage 3 unspecified: Secondary | ICD-10-CM

## 2017-10-21 DIAGNOSIS — D508 Other iron deficiency anemias: Secondary | ICD-10-CM | POA: Diagnosis not present

## 2017-10-21 DIAGNOSIS — N401 Enlarged prostate with lower urinary tract symptoms: Secondary | ICD-10-CM | POA: Diagnosis not present

## 2017-10-21 DIAGNOSIS — Z0000 Encounter for general adult medical examination without abnormal findings: Secondary | ICD-10-CM

## 2017-10-21 DIAGNOSIS — N138 Other obstructive and reflux uropathy: Secondary | ICD-10-CM | POA: Diagnosis not present

## 2017-10-21 DIAGNOSIS — K219 Gastro-esophageal reflux disease without esophagitis: Secondary | ICD-10-CM

## 2017-10-21 LAB — CBC WITH DIFFERENTIAL/PLATELET
BASOS ABS: 0 10*3/uL (ref 0.0–0.1)
Basophils Relative: 0.9 % (ref 0.0–3.0)
Eosinophils Absolute: 0.1 10*3/uL (ref 0.0–0.7)
Eosinophils Relative: 2.7 % (ref 0.0–5.0)
HCT: 44.4 % (ref 39.0–52.0)
Hemoglobin: 14.7 g/dL (ref 13.0–17.0)
LYMPHS ABS: 1.2 10*3/uL (ref 0.7–4.0)
Lymphocytes Relative: 23 % (ref 12.0–46.0)
MCHC: 33.2 g/dL (ref 30.0–36.0)
MCV: 95.7 fl (ref 78.0–100.0)
MONOS PCT: 8.8 % (ref 3.0–12.0)
Monocytes Absolute: 0.5 10*3/uL (ref 0.1–1.0)
NEUTROS PCT: 64.6 % (ref 43.0–77.0)
Neutro Abs: 3.5 10*3/uL (ref 1.4–7.7)
Platelets: 188 10*3/uL (ref 150.0–400.0)
RBC: 4.64 Mil/uL (ref 4.22–5.81)
RDW: 13.6 % (ref 11.5–15.5)
WBC: 5.4 10*3/uL (ref 4.0–10.5)

## 2017-10-21 LAB — TSH: TSH: 3.1 u[IU]/mL (ref 0.35–4.50)

## 2017-10-21 LAB — COMPREHENSIVE METABOLIC PANEL
ALK PHOS: 74 U/L (ref 39–117)
ALT: 11 U/L (ref 0–53)
AST: 14 U/L (ref 0–37)
Albumin: 3.7 g/dL (ref 3.5–5.2)
BILIRUBIN TOTAL: 0.6 mg/dL (ref 0.2–1.2)
BUN: 24 mg/dL — ABNORMAL HIGH (ref 6–23)
CALCIUM: 8.9 mg/dL (ref 8.4–10.5)
CO2: 32 mEq/L (ref 19–32)
Chloride: 102 mEq/L (ref 96–112)
Creatinine, Ser: 1.48 mg/dL (ref 0.40–1.50)
GFR: 48.68 mL/min — AB (ref >60.00)
GLUCOSE: 91 mg/dL (ref 70–99)
Potassium: 4.1 mEq/L (ref 3.5–5.1)
Sodium: 141 mEq/L (ref 135–145)
TOTAL PROTEIN: 6.7 g/dL (ref 6.0–8.3)

## 2017-10-21 LAB — LIPID PANEL
Cholesterol: 159 mg/dL (ref 0–200)
HDL: 40.3 mg/dL (ref >39.00)
LDL Cholesterol: 103 mg/dL — ABNORMAL HIGH (ref 0–99)
NonHDL: 118.39
Total CHOL/HDL Ratio: 4
Triglycerides: 79 mg/dL (ref 0.0–149.0)
VLDL: 15.8 mg/dL (ref 0.0–40.0)

## 2017-10-21 NOTE — Progress Notes (Signed)
Subjective:    Patient ID: Alex Dunn, male    DOB: Jan 22, 1936, 81 y.o.   MRN: [id number]  HPI  81 year old patient who is seen today for annual follow-up and for a subsequent Medicare wellness visit He continues to do quite well.  He continues to work part-time and has no concerns or complaints except for some osteoarthritic complaints involving the small joints of the hands and neck. He has essential hypertension and this is controlled on hydrochlorothiazide.  He takes low-dose aspirin therapy and rare PPI therapy  Past Medical History:  Diagnosis Date  . ANEMIA-IRON DEFICIENCY 06/23/2007  . BENIGN PROSTATIC HYPERTROPHY 06/23/2007  . GERD 06/23/2007  . Hypertension      Social History   Socioeconomic History  . Marital status: Married    Spouse name: Not on file  . Number of children: Not on file  . Years of education: Not on file  . Highest education level: Not on file  Social Needs  . Financial resource strain: Not on file  . Food insecurity - worry: Not on file  . Food insecurity - inability: Not on file  . Transportation needs - medical: Not on file  . Transportation needs - non-medical: Not on file  Occupational History  . Not on file  Tobacco Use  . Smoking status: Never Smoker  . Smokeless tobacco: Never Used  Substance and Sexual Activity  . Alcohol use: No  . Drug use: No  . Sexual activity: Not on file  Other Topics Concern  . Not on file  Social History Narrative  . Not on file    Past Surgical History:  Procedure Laterality Date  . HAND SURGERY      Family History  Problem Relation Age of Onset  . Stroke Brother   . Cancer Brother        prostate ca    Allergies  Allergen Reactions  . Codeine Phosphate     Current Outpatient Medications on File Prior to Visit  Medication Sig Dispense Refill  . aspirin EC 81 MG tablet Take 1 tablet (81 mg total) by mouth daily.    . hydrochlorothiazide (HYDRODIURIL) 12.5 MG tablet TAKE ONE TABLET BY  MOUTH EVERY DAY 90 tablet 1  . pantoprazole (PROTONIX) 40 MG tablet Take 1 tablet (40 mg total) by mouth daily as needed. 90 tablet 3   No current facility-administered medications on file prior to visit.     BP 138/66 (BP Location: Left Arm, Patient Position: Sitting, Cuff Size: Normal)   Pulse 69   Temp 98.1 F (36.7 C) (Oral)   Ht 6' (1.829 m)   Wt 187 lb 12.8 oz (85.2 kg)   SpO2 98%   BMI 25.47 kg/m   Subsequent Medicare wellness visit  1. Risk factors, based on past  M,S,F history.  Current vascular risk factors include a history of hypertension, age and male sex  2.  Physical activities: remains fairly active with work around the house and also his church.  No exercise limitations.  Still works part-me with [employer].  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  Denies any exercise limitations  3.  Depression/mood:no history of major depression or mood disorder  4.  Hearing:no major deficits  5.  ADL's:independent  6.  Fall risk:low  7.  Home safety:no problems identified  8.  Height weight, and visual acuity;eight and weight stable no change in visual acuity has had an eye exam earlier today.  Annual  eye examination scheduled soon  9.  Counseling:daily low-dose aspirin.  Encouraged  10. Lab orders based on risk factors:laboratory profile reviewed  11. Referral :not appropriate at this time  12. Care plan:continue heart healthy diet;  more rigorous exercise activities.  Encouraged  13. Cognitive assessment: alert and appropriate with normal affect.  No cognitive dysfunction  14. Screening: Patient provided with a written and personalized 5-10 year screening schedule in the AVS.    15. Provider List Update: primary care ophthalmology.  Will continue annual evaluations  Review of Systems  Constitutional: Negative for activity change, appetite change, chills, fatigue and fever.  HENT: Negative for congestion, dental problem, ear  pain, hearing loss, mouth sores, rhinorrhea, sinus pressure, sneezing, tinnitus, trouble swallowing and voice change.   Eyes: Negative for photophobia, pain, redness and visual disturbance.  Respiratory: Negative for apnea, cough, choking, chest tightness, shortness of breath and wheezing.   Cardiovascular: Negative for chest pain, palpitations and leg swelling.  Gastrointestinal: Negative for abdominal distention, abdominal pain, anal bleeding, blood in stool, constipation, diarrhea, nausea, rectal pain and vomiting.  Genitourinary: Negative for decreased urine volume, difficulty urinating, discharge, dysuria, flank pain, frequency, genital sores, hematuria, penile swelling, scrotal swelling, testicular pain and urgency.  Musculoskeletal: Positive for arthralgias. Negative for back pain, gait problem, joint swelling, myalgias, neck pain and neck stiffness.  Skin: Negative for color change, rash and wound.  Neurological: Negative for dizziness, tremors, seizures, syncope, facial asymmetry, speech difficulty, weakness, light-headedness, numbness and headaches.  Hematological: Negative for adenopathy. Does not bruise/bleed easily.  Psychiatric/Behavioral: Negative for agitation, behavioral problems, confusion, decreased concentration, dysphoric mood, hallucinations, self-injury, sleep disturbance and suicidal ideas. The patient is not nervous/anxious.        Objective:   Physical Exam  Constitutional: He appears well-developed and well-nourished.  HENT:  Head: Normocephalic and atraumatic.  Right Ear: External ear normal.  Left Ear: External ear normal.  Nose: Nose normal.  Mouth/Throat: Oropharynx is clear and moist.  Eyes: Conjunctivae and EOM are normal. Pupils are equal, round, and reactive to light. No scleral icterus.  Neck: Normal range of motion. Neck supple. No JVD present. No thyromegaly present.  Cardiovascular: Regular rhythm, normal heart sounds and intact distal pulses. Exam  reveals no gallop and no friction rub.  No murmur heard. Dorsalis pedis pulses full.  Posterior tibial pulses not easily palpable  Pulmonary/Chest: Effort normal and breath sounds normal. He exhibits no tenderness.  Abdominal: Soft. Bowel sounds are normal. He exhibits no distension and no mass. There is no tenderness.  Genitourinary: Penis normal. Rectal exam shows guaiac negative stool.  Genitourinary Comments: Prostate symmetrically enlarged  Musculoskeletal: Normal range of motion. He exhibits no edema or tenderness.  Lymphadenopathy:    He has no cervical adenopathy.  Neurological: He is alert. He has normal reflexes. No cranial nerve deficit. Coordination normal.  Skin: Skin is warm and dry. No rash noted.  Psychiatric: He has a normal mood and affect. His behavior is normal.          Assessment & Plan:   Preventive health examination Subsequent Medicare wellness visit Essential hypertension stable BPH Osteoarthritis History of chronic kidney disease.  Will review renal indices Gastroesophageal reflux disease  Will review screening lab Low-salt diet recommended More rigorous physical activity encouraged Home blood pressure monitoring encouraged  Follow-up 1 year  Nyoka Cowden

## 2017-10-21 NOTE — Patient Instructions (Signed)
Limit your sodium (Salt) intake  Please check your blood pressure on a regular basis.  If it is consistently greater than 150/90, please make an office appointment.    It is important that you exercise regularly, at least 20 minutes 3 to 4 times per week.  If you develop chest pain or shortness of breath seek  medical attention.  Return in one year for follow-up  

## 2018-01-28 ENCOUNTER — Ambulatory Visit: Payer: Medicare Other | Admitting: Adult Health

## 2018-01-28 ENCOUNTER — Encounter: Payer: Self-pay | Admitting: Adult Health

## 2018-01-28 VITALS — BP 146/70 | Temp 98.3°F | Wt 186.0 lb

## 2018-01-28 DIAGNOSIS — I10 Essential (primary) hypertension: Secondary | ICD-10-CM | POA: Diagnosis not present

## 2018-01-28 DIAGNOSIS — R5383 Other fatigue: Secondary | ICD-10-CM

## 2018-01-28 LAB — BASIC METABOLIC PANEL
BUN: 20 mg/dL (ref 6–23)
CHLORIDE: 101 meq/L (ref 96–112)
CO2: 31 meq/L (ref 19–32)
Calcium: 9.3 mg/dL (ref 8.4–10.5)
Creatinine, Ser: 1.36 mg/dL (ref 0.40–1.50)
GFR: 53.64 mL/min — ABNORMAL LOW (ref >60.00)
Glucose, Bld: 84 mg/dL (ref 70–99)
POTASSIUM: 4.1 meq/L (ref 3.5–5.1)
Sodium: 140 mEq/L (ref 135–145)

## 2018-01-28 LAB — CBC WITH DIFFERENTIAL/PLATELET
BASOS ABS: 0 10*3/uL (ref 0.0–0.1)
Basophils Relative: 0.5 % (ref 0.0–3.0)
EOS ABS: 0.1 10*3/uL (ref 0.0–0.7)
Eosinophils Relative: 0.8 % (ref 0.0–5.0)
HEMATOCRIT: 44 % (ref 39.0–52.0)
HEMOGLOBIN: 15.2 g/dL (ref 13.0–17.0)
LYMPHS PCT: 20 % (ref 12.0–46.0)
Lymphs Abs: 1.4 10*3/uL (ref 0.7–4.0)
MCHC: 34.5 g/dL (ref 30.0–36.0)
MCV: 94.1 fl (ref 78.0–100.0)
Monocytes Absolute: 0.7 10*3/uL (ref 0.1–1.0)
Monocytes Relative: 9.6 % (ref 3.0–12.0)
NEUTROS ABS: 4.8 10*3/uL (ref 1.4–7.7)
Neutrophils Relative %: 69.1 % (ref 43.0–77.0)
PLATELETS: 211 10*3/uL (ref 150.0–400.0)
RBC: 4.67 Mil/uL (ref 4.22–5.81)
RDW: 13.7 % (ref 11.5–15.5)
WBC: 7 10*3/uL (ref 4.0–10.5)

## 2018-01-28 LAB — VITAMIN D 25 HYDROXY (VIT D DEFICIENCY, FRACTURES): VITD: 16.71 ng/mL — ABNORMAL LOW (ref 30.00–100.00)

## 2018-01-28 LAB — TSH: TSH: 2.78 u[IU]/mL (ref 0.35–4.50)

## 2018-01-28 NOTE — Progress Notes (Signed)
Subjective:    Patient ID: Alex Dunn, male    DOB: [birth year], 82 y.o.   MRN: [id number]  HPI  82 year old male who  has a past medical history of ANEMIA-IRON DEFICIENCY (06/23/2007), BENIGN PROSTATIC HYPERTROPHY (06/23/2007), GERD (06/23/2007), and Hypertension.  He is a patient of Dr. Raliegh Ip who I am seeing today for an acute care visit. He presents with multiple complaints.   Per patient over the last week he has felt fatigued and his blood pressure has been elevated. He reports monitoring his blood pressure at home and has had readings over the last week of 993-716 systolic. He is prescribed HCTZ 12.5 mg daily and reports taking his medication as directed. The fatigue aspect is worse in the morning but continues throughout the afternoon and evening. He does not feel as though he is sleeping well because of his elevated blood pressure readings.   In addition to this he does report waking up in the middle of the night sweating and feeling warm.  This has happened twice this week. Has not had any chills and has not been monitoring his temperature.   Denies CP, SOB, N/v/d, or recent travel outside of the [place].   Review of Systems See HPI   Past Medical History:  Diagnosis Date  . ANEMIA-IRON DEFICIENCY 06/23/2007  . BENIGN PROSTATIC HYPERTROPHY 06/23/2007  . GERD 06/23/2007  . Hypertension     Social History   Socioeconomic History  . Marital status: Married    Spouse name: Not on file  . Number of children: Not on file  . Years of education: Not on file  . Highest education level: Not on file  Occupational History  . Not on file  Social Needs  . Financial resource strain: Not on file  . Food insecurity:    Worry: Not on file    Inability: Not on file  . Transportation needs:    Medical: Not on file    Non-medical: Not on file  Tobacco Use  . Smoking status: Never Smoker  . Smokeless tobacco: Never Used  Substance and Sexual Activity  . Alcohol use: No  . Drug use: No    . Sexual activity: Not on file  Lifestyle  . Physical activity:    Days per week: Not on file    Minutes per session: Not on file  . Stress: Not on file  Relationships  . Social connections:    Talks on phone: Not on file    Gets together: Not on file    Attends religious service: Not on file    Active member of club or organization: Not on file    Attends meetings of clubs or organizations: Not on file    Relationship status: Not on file  . Intimate partner violence:    Fear of current or ex partner: Not on file    Emotionally abused: Not on file    Physically abused: Not on file    Forced sexual activity: Not on file  Other Topics Concern  . Not on file  Social History Narrative  . Not on file    Past Surgical History:  Procedure Laterality Date  . HAND SURGERY      Family History  Problem Relation Age of Onset  . Stroke Brother   . Cancer Brother        prostate ca    Allergies  Allergen Reactions  . Codeine Phosphate     Current Outpatient Medications on  File Prior to Visit  Medication Sig Dispense Refill  . hydrochlorothiazide (HYDRODIURIL) 12.5 MG tablet TAKE ONE TABLET BY MOUTH EVERY DAY 90 tablet 1  . pantoprazole (PROTONIX) 40 MG tablet Take 1 tablet (40 mg total) by mouth daily as needed. 90 tablet 3  . aspirin EC 81 MG tablet Take 1 tablet (81 mg total) by mouth daily. (Patient not taking: Reported on 01/28/2018)     No current facility-administered medications on file prior to visit.     BP (!) 146/70 (BP Location: Left Arm)   Temp 98.3 F (36.8 C) (Other (Comment))   Wt 186 lb (84.4 kg)   BMI 25.23 kg/m       Objective:   Physical Exam  Constitutional: He is oriented to person, place, and time. He appears well-developed and well-nourished. No distress.  HENT:  Head: Normocephalic and atraumatic.  Right Ear: External ear normal.  Left Ear: External ear normal.  Nose: Nose normal.  Mouth/Throat: Oropharynx is clear and moist. No  oropharyngeal exudate.  Eyes: Pupils are equal, round, and reactive to light. Conjunctivae and EOM are normal. Right eye exhibits no discharge. Left eye exhibits no discharge. No scleral icterus.  Neck: Normal range of motion. Neck supple. No JVD present. No tracheal deviation present. No thyromegaly present.  Cardiovascular: Normal rate, regular rhythm, normal heart sounds and intact distal pulses. Exam reveals no gallop.  No murmur heard. Pulmonary/Chest: Effort normal and breath sounds normal. No stridor. No respiratory distress. He has no wheezes. He has no rales. He exhibits no tenderness.  Abdominal: Soft. Bowel sounds are normal. He exhibits no distension and no mass. There is no tenderness. There is no rebound and no guarding.  Lymphadenopathy:    He has no cervical adenopathy.  Neurological: He is alert and oriented to person, place, and time.  Skin: Skin is warm and dry. No rash noted. He is not diaphoretic. No erythema. No pallor.  Psychiatric: He has a normal mood and affect. His behavior is normal. Judgment and thought content normal.  Nursing note and vitals reviewed.     Assessment & Plan:  Benign exam. Unknown etiology at this time. Possible viral syndrome. BP is elevated which may be causing some of his symptoms.   Will check CBC, BMP, TSH, and Vitamin D level. Will likely increase HCTZ from 12.5 mg to 25 mg, once labs are back  Follow up with Dr. Raliegh Ip in one week if not feeling better.   Dorothyann Peng, NP

## 2018-01-29 ENCOUNTER — Telehealth: Payer: Self-pay | Admitting: Internal Medicine

## 2018-01-29 NOTE — Telephone Encounter (Signed)
Left message to call back for lab results.

## 2018-01-29 NOTE — Telephone Encounter (Signed)
Pt called for results. Called office bc lab notes state for pt to call clinic and CRM says [facility]. Was told [facility] can give results. Line busy 8 minutes. Please call pt on cell [phone].  Copied from [place]. Topic: Quick Communication - Lab Results >> Jan 29, 2018 10:50 AM Zacarias Pontes, CMA wrote: Called patient to inform them of their lab results. When patient returns call, triage nurse may disclose results.

## 2018-02-10 ENCOUNTER — Encounter: Payer: Self-pay | Admitting: Internal Medicine

## 2018-02-10 ENCOUNTER — Ambulatory Visit: Payer: Medicare Other | Admitting: Internal Medicine

## 2018-02-10 VITALS — BP 118/60 | HR 78 | Temp 98.3°F | Wt 188.0 lb

## 2018-02-10 DIAGNOSIS — I10 Essential (primary) hypertension: Secondary | ICD-10-CM | POA: Diagnosis not present

## 2018-02-10 NOTE — Patient Instructions (Addendum)
Limit your sodium (Salt) intake    It is important that you exercise regularly, at least 20 minutes 3 to 4 times per week.  If you develop chest pain or shortness of breath seek  medical attention.  Please check your blood pressure on a regular basis.  If it is consistently greater than 140/90, please make an office appointment.  Return in 6 months for follow-up  Take a calcium supplement, plus 1000-1500 units of vitamin D

## 2018-02-10 NOTE — Progress Notes (Signed)
Subjective:    Patient ID: Alex Dunn, male    DOB: 29-Jul-1936, 82 y.o.   MRN: [id number]  HPI  82 year old patient who was seen recently with complaints of fatigue nocturnal sweats.  Blood pressure has been running a bit elevated.  It was felt that he probably had a viral illness.  Laboratory screen was obtained that was unremarkable except for a low vitamin D level.  He now is on vitamin D supplementation.  His symptoms have resolved and he feels that he is back to baseline.  Due to an elevated blood pressure is hydrochlorothiazide was increased to 25 mg daily.  Patient states that his blood pressure was running a bit low and he has resumed 12.5 mg daily.  Today he feels well.  Past Medical History:  Diagnosis Date  . ANEMIA-IRON DEFICIENCY 06/23/2007  . BENIGN PROSTATIC HYPERTROPHY 06/23/2007  . GERD 06/23/2007  . Hypertension      Social History   Socioeconomic History  . Marital status: Married    Spouse name: Not on file  . Number of children: Not on file  . Years of education: Not on file  . Highest education level: Not on file  Occupational History  . Not on file  Social Needs  . Financial resource strain: Not on file  . Food insecurity:    Worry: Not on file    Inability: Not on file  . Transportation needs:    Medical: Not on file    Non-medical: Not on file  Tobacco Use  . Smoking status: Never Smoker  . Smokeless tobacco: Never Used  Substance and Sexual Activity  . Alcohol use: No  . Drug use: No  . Sexual activity: Not on file  Lifestyle  . Physical activity:    Days per week: Not on file    Minutes per session: Not on file  . Stress: Not on file  Relationships  . Social connections:    Talks on phone: Not on file    Gets together: Not on file    Attends religious service: Not on file    Active member of club or organization: Not on file    Attends meetings of clubs or organizations: Not on file    Relationship status: Not on file  . Intimate  partner violence:    Fear of current or ex partner: Not on file    Emotionally abused: Not on file    Physically abused: Not on file    Forced sexual activity: Not on file  Other Topics Concern  . Not on file  Social History Narrative  . Not on file    Past Surgical History:  Procedure Laterality Date  . HAND SURGERY      Family History  Problem Relation Age of Onset  . Stroke Brother   . Cancer Brother        prostate ca    Allergies  Allergen Reactions  . Codeine Phosphate     Current Outpatient Medications on File Prior to Visit  Medication Sig Dispense Refill  . aspirin EC 81 MG tablet Take 1 tablet (81 mg total) by mouth daily.    . hydrochlorothiazide (HYDRODIURIL) 12.5 MG tablet TAKE ONE TABLET BY MOUTH EVERY DAY 90 tablet 1  . pantoprazole (PROTONIX) 40 MG tablet Take 1 tablet (40 mg total) by mouth daily as needed. 90 tablet 3   No current facility-administered medications on file prior to visit.     BP 118/60 (BP  Location: Right Arm, Patient Position: Sitting, Cuff Size: Large)   Pulse 78   Temp 98.3 F (36.8 C) (Oral)   Wt 188 lb (85.3 kg)   SpO2 99%   BMI 25.50 kg/m     Review of Systems Positive for Fatigue sweats.  The symptoms have resolved     Objective:   Physical Exam  Blood pressure 130/80 HEENT exam unremarkable oropharynx clear and appear to be well-hydrated Chest clear Cardiovascular normal S1-S2 no murmurs.  Rhythm regular with rate of about 75 Abdomen soft and nontender  extremities no edema          Assessment & Plan:   Probable viral syndrome.  Resolved Essential hypertension.  Blood pressure well controlled on 12.5 mg hydrochlorothiazide.  We will continue present dose  Continue home blood pressure monitoring and restricted salt diet Follow-up 6 months or as needed  Alex Dunn

## 2018-03-06 ENCOUNTER — Emergency Department (HOSPITAL_BASED_OUTPATIENT_CLINIC_OR_DEPARTMENT_OTHER): Payer: Medicare Other

## 2018-03-06 ENCOUNTER — Emergency Department (HOSPITAL_BASED_OUTPATIENT_CLINIC_OR_DEPARTMENT_OTHER)
Admission: EM | Admit: 2018-03-06 | Discharge: 2018-03-06 | Disposition: A | Discharge status: Home / Self Care | Payer: Medicare Other | Attending: Emergency Medicine | Admitting: Emergency Medicine

## 2018-03-06 ENCOUNTER — Encounter (HOSPITAL_BASED_OUTPATIENT_CLINIC_OR_DEPARTMENT_OTHER): Payer: Self-pay | Admitting: Emergency Medicine

## 2018-03-06 ENCOUNTER — Other Ambulatory Visit: Payer: Self-pay

## 2018-03-06 DIAGNOSIS — N189 Chronic kidney disease, unspecified: Secondary | ICD-10-CM | POA: Insufficient documentation

## 2018-03-06 DIAGNOSIS — M791 Myalgia, unspecified site: Secondary | ICD-10-CM | POA: Diagnosis not present

## 2018-03-06 DIAGNOSIS — R531 Weakness: Secondary | ICD-10-CM | POA: Insufficient documentation

## 2018-03-06 DIAGNOSIS — R51 Headache: Secondary | ICD-10-CM | POA: Diagnosis not present

## 2018-03-06 DIAGNOSIS — R61 Generalized hyperhidrosis: Secondary | ICD-10-CM | POA: Insufficient documentation

## 2018-03-06 DIAGNOSIS — I129 Hypertensive chronic kidney disease with stage 1 through stage 4 chronic kidney disease, or unspecified chronic kidney disease: Secondary | ICD-10-CM | POA: Insufficient documentation

## 2018-03-06 DIAGNOSIS — R634 Abnormal weight loss: Secondary | ICD-10-CM | POA: Insufficient documentation

## 2018-03-06 DIAGNOSIS — R52 Pain, unspecified: Secondary | ICD-10-CM

## 2018-03-06 DIAGNOSIS — R5383 Other fatigue: Secondary | ICD-10-CM | POA: Diagnosis not present

## 2018-03-06 LAB — URINALYSIS, ROUTINE W REFLEX MICROSCOPIC
BILIRUBIN URINE: NEGATIVE
Glucose, UA: NEGATIVE mg/dL
Hgb urine dipstick: NEGATIVE
Ketones, ur: NEGATIVE mg/dL
Leukocytes, UA: NEGATIVE
Nitrite: NEGATIVE
Protein, ur: NEGATIVE mg/dL
SPECIFIC GRAVITY, URINE: 1.02 (ref 1.005–1.030)
pH: 7 (ref 5.0–8.0)

## 2018-03-06 LAB — COMPREHENSIVE METABOLIC PANEL
ALK PHOS: 71 U/L (ref 38–126)
ALT: 13 U/L — ABNORMAL LOW (ref 17–63)
ANION GAP: 8 (ref 5–15)
AST: 23 U/L (ref 15–41)
Albumin: 3.9 g/dL (ref 3.5–5.0)
BILIRUBIN TOTAL: 1.1 mg/dL (ref 0.3–1.2)
BUN: 25 mg/dL — ABNORMAL HIGH (ref 6–20)
CALCIUM: 8.8 mg/dL — AB (ref 8.9–10.3)
CO2: 27 mmol/L (ref 22–32)
Chloride: 104 mmol/L (ref 101–111)
Creatinine, Ser: 1.46 mg/dL — ABNORMAL HIGH (ref 0.61–1.24)
GFR calc non Af Amer: 44 mL/min — ABNORMAL LOW (ref >60)
GFR, EST AFRICAN AMERICAN: 51 mL/min — AB (ref >60)
Glucose, Bld: 106 mg/dL — ABNORMAL HIGH (ref 65–99)
POTASSIUM: 3.9 mmol/L (ref 3.5–5.1)
Sodium: 139 mmol/L (ref 135–145)
TOTAL PROTEIN: 7.3 g/dL (ref 6.5–8.1)

## 2018-03-06 LAB — CBC WITH DIFFERENTIAL/PLATELET
BASOS PCT: 0 %
Basophils Absolute: 0 10*3/uL (ref 0.0–0.1)
Eosinophils Absolute: 0.1 10*3/uL (ref 0.0–0.7)
Eosinophils Relative: 1 %
HEMATOCRIT: 45 % (ref 39.0–52.0)
HEMOGLOBIN: 15.5 g/dL (ref 13.0–17.0)
LYMPHS PCT: 19 %
Lymphs Abs: 1.6 10*3/uL (ref 0.7–4.0)
MCH: 32.6 pg (ref 26.0–34.0)
MCHC: 34.4 g/dL (ref 30.0–36.0)
MCV: 94.7 fL (ref 78.0–100.0)
Monocytes Absolute: 0.8 10*3/uL (ref 0.1–1.0)
Monocytes Relative: 9 %
NEUTROS ABS: 6.2 10*3/uL (ref 1.7–7.7)
NEUTROS PCT: 71 %
Platelets: 212 10*3/uL (ref 150–400)
RBC: 4.75 MIL/uL (ref 4.22–5.81)
RDW: 13.1 % (ref 11.5–15.5)
WBC: 8.7 10*3/uL (ref 4.0–10.5)

## 2018-03-06 LAB — TROPONIN I: Troponin I: <0.03 ng/mL (ref <0.03)

## 2018-03-06 MED ORDER — SODIUM CHLORIDE 0.9 % IV BOLUS
500.0000 mL | Freq: Once | INTRAVENOUS | Status: AC
  Administered 2018-03-06: 500 mL via INTRAVENOUS

## 2018-03-06 NOTE — ED Triage Notes (Signed)
Patient report generalized weakness and body aches for the past 6 weeks.  Reports this has been intermittent but worse since this morning.  Denies chest pain, shortness of breath, dysuria.   Spouse reports patient has had "night sweats". Ambulatory to room in NAD.

## 2018-03-06 NOTE — Discharge Instructions (Signed)
You were seen in the ED today with weakness. Your labs and chest x-ray are normal.

## 2018-03-06 NOTE — ED Provider Notes (Signed)
Emergency Department Provider Note   I have reviewed the triage vital signs and the nursing notes.   HISTORY  Chief Complaint Fatigue   HPI Alex Dunn is a 82 y.o. male with PMH of GERD, HTN, and anemia presents to the emergency department for evaluation of intermittent generalized weakness over the last 6 weeks.  He has associated night sweats at times and feels very low energy.  He has seen his primary care physician twice guarding the symptoms with unremarkable lab work.  He was started on vitamin D supplementation but symptoms have continued.  Over the past 36 hours she is developed body aches and worsening fatigue.  He denies any chest pain, shortness of breath, dysuria.  No nausea, vomiting, diarrhea.  He denies any abdominal or back pain.  He notes some mild diffuse headache.  Denies sudden onset, maximal intensity headache symptoms. No productive cough or hemoptysis.    Past Medical History:  Diagnosis Date  . ANEMIA-IRON DEFICIENCY 06/23/2007  . BENIGN PROSTATIC HYPERTROPHY 06/23/2007  . GERD 06/23/2007  . Hypertension     Patient Active Problem List   Diagnosis Date Noted  . Chronic kidney disease 10/18/2016  . Hypertension 01/17/2011  . ANEMIA-IRON DEFICIENCY 06/23/2007  . GERD 06/23/2007  . BPH (benign prostatic hyperplasia) 06/23/2007    Past Surgical History:  Procedure Laterality Date  . HAND SURGERY      Current Outpatient Rx  . Order #: [id number] Class: OTC  . Order #: [id number] Class: Normal  . Order #: [id number] Class: Normal    Allergies Codeine phosphate  Family History  Problem Relation Age of Onset  . Stroke Brother   . Cancer Brother        prostate ca    Social History Social History   Tobacco Use  . Smoking status: Never Smoker  . Smokeless tobacco: Never Used  Substance Use Topics  . Alcohol use: No  . Drug use: No    Review of Systems  Constitutional: No fever/chills. Positive body aches and generalized weakness.    Eyes: No visual changes. ENT: No sore throat. Cardiovascular: Denies chest pain. Respiratory: Denies shortness of breath. Gastrointestinal: No abdominal pain.  No nausea, no vomiting.  No diarrhea.  No constipation. Genitourinary: Negative for dysuria. Musculoskeletal: Negative for back pain. Skin: Negative for rash. Neurological: Negative for focal weakness or numbness. Positive mild HA.   10-point ROS otherwise negative.  ____________________________________________   PHYSICAL EXAM:  VITAL SIGNS: ED Triage Vitals [03/06/18 1046]  Enc Vitals Group     BP (!) 159/75     Pulse Rate 71     Resp 16     Temp 98.4 F (36.9 C)     Temp Source Oral     SpO2 98 %     Weight 189 lb (85.7 kg)     Height 6' (1.829 m)     Pain Score 9   Constitutional: Alert and oriented. Well appearing and in no acute distress. Eyes: Conjunctivae are normal. PERRL. EOMI. Head: Atraumatic. Ears:  Healthy appearing ear canals and TMs bilaterally Nose: No congestion/rhinnorhea. Mouth/Throat: Mucous membranes are moist.  Oropharynx without erythema.  Neck: No stridor.   Cardiovascular: Normal rate, regular rhythm. Good peripheral circulation. Grossly normal heart sounds.   Respiratory: Normal respiratory effort.  No retractions. Lungs CTAB. Gastrointestinal: Soft and nontender. No distention.  Musculoskeletal: No lower extremity tenderness nor edema. No gross deformities of extremities. Neurologic:  Normal speech and language. No gross focal neurologic deficits are  appreciated. Normal CN exam 2-12. No pronator drift.  Skin:  Skin is warm, dry and intact. No rash noted.  ____________________________________________   LABS (all labs ordered are listed, but only abnormal results are displayed)  Labs Reviewed  COMPREHENSIVE METABOLIC PANEL - Abnormal; Notable for the following components:      Result Value   Glucose, Bld 106 (*)    BUN 25 (*)    Creatinine, Ser 1.46 (*)    Calcium 8.8 (*)     ALT 13 (*)    GFR calc non Af Amer 44 (*)    GFR calc Af Amer 51 (*)    All other components within normal limits  CBC WITH DIFFERENTIAL/PLATELET  TROPONIN I  URINALYSIS, ROUTINE W REFLEX MICROSCOPIC   ____________________________________________  EKG   EKG Interpretation  Date/Time:  Friday Mar 06 2018 10:51:15 EDT Ventricular Rate:  66 PR Interval:    QRS Duration: 75 QT Interval:  371 QTC Calculation: 389 R Axis:   51 Text Interpretation:  Sinus rhythm Low voltage, precordial leads Abnormal R-wave progression, early transition No STEMI. No old tracing for comparison Confirmed by Nanda Quinton [phone]) on 03/06/2018 10:54:18 AM       ____________________________________________  RADIOLOGY  Dg Chest 2 View  Result Date: 03/06/2018 CLINICAL DATA:  Night sweats, weakness, lethargy over the last 6 weeks EXAM: CHEST - 2 VIEW COMPARISON:  Chest x-ray of 12/04/2014 FINDINGS: No active infiltrate or effusion is seen. Mediastinal and hilar contours are unremarkable. The right hemidiaphragm remains slightly elevated. The heart is within normal limits in size. No acute bony abnormality is seen. IMPRESSION: No active cardiopulmonary disease. Electronically Signed   By: Ivar Drape M.D.   On: 03/06/2018 11:48    ____________________________________________   PROCEDURES  Procedure(s) performed:   Procedures  None ____________________________________________   INITIAL IMPRESSION / ASSESSMENT AND PLAN / ED COURSE  Pertinent labs & imaging results that were available during my care of the patient were reviewed by me and considered in my medical decision making (see chart for details).  Patient presents to the emergency department with intermittent generalized weakness and night sweats.  He has new onset body aches today.  Patient is low risk for TB.  He has had some associated unintentional weight loss over the past 6 to 8 months.  He is afebrile here with otherwise normal vital  signs.  I reviewed the patient's EKG with no acute findings.  Doubt atypical ACS.  No clear source for infection at this time.  Labs performed in late March were reviewed including normal TSH.  Given night sweats plan for chest x-ray.  Abdomen is completely soft and nontender.  I do not see an indication at this time for advanced imaging of the head, chest, abdomen/pelvis.  Plan for labs, IV fluids, and reassess.  Patient feeling slightly better after IVF. CKD at baseline. No significant electrolyte abnormality. UA negative. CXR reviewed with no acute findings. No focal neuro deficits. No anemia. Plan for continued PCP follow up. Provided contact info for Cardiology to call on Monday and schedule outpatient follow up for consideration of ECHO +/- stress testing.   At this time, I do not feel there is any life-threatening condition present. I have reviewed and discussed all results (EKG, imaging, lab, urine as appropriate), exam findings with patient. I have reviewed nursing notes and appropriate previous records.  I feel the patient is safe to be discharged home without further emergent workup. Discussed usual and customary return  precautions. Patient and family (if present) verbalize understanding and are comfortable with this plan.  Patient will follow-up with their primary care provider. If they do not have a primary care provider, information for follow-up has been provided to them. All questions have been answered.  ____________________________________________  FINAL CLINICAL IMPRESSION(S) / ED DIAGNOSES  Final diagnoses:  Generalized weakness  Body aches     MEDICATIONS GIVEN DURING THIS VISIT:  Medications  sodium chloride 0.9 % bolus 500 mL (0 mLs Intravenous Stopped 03/06/18 1237)    Note:  This document was prepared using Dragon voice recognition software and may include unintentional dictation errors.  Nanda Quinton, MD Emergency Medicine    Long, [facility], MD 03/06/18 [phone]

## 2018-03-09 ENCOUNTER — Encounter: Payer: Self-pay | Admitting: Internal Medicine

## 2018-03-09 ENCOUNTER — Ambulatory Visit: Payer: Medicare Other | Admitting: Internal Medicine

## 2018-03-09 VITALS — BP 130/72 | HR 68 | Temp 98.6°F | Wt 182.0 lb

## 2018-03-09 DIAGNOSIS — R5382 Chronic fatigue, unspecified: Secondary | ICD-10-CM | POA: Diagnosis not present

## 2018-03-09 DIAGNOSIS — R634 Abnormal weight loss: Secondary | ICD-10-CM

## 2018-03-09 DIAGNOSIS — I10 Essential (primary) hypertension: Secondary | ICD-10-CM

## 2018-03-09 DIAGNOSIS — N183 Chronic kidney disease, stage 3 unspecified: Secondary | ICD-10-CM

## 2018-03-09 DIAGNOSIS — Z111 Encounter for screening for respiratory tuberculosis: Secondary | ICD-10-CM | POA: Diagnosis not present

## 2018-03-09 LAB — TB SKIN TEST
INDURATION: 0 mm
TB SKIN TEST: NEGATIVE

## 2018-03-09 MED ORDER — ERGOCALCIFEROL 1.25 MG (50000 UT) PO CAPS: 50000.0000 [IU] | ORAL_CAPSULE | ORAL | 0 refills | Status: DC

## 2018-03-09 NOTE — Progress Notes (Signed)
Subjective:    Patient ID: Alex Dunn, male    DOB: June 15, 1936, 82 y.o.   MRN: [id number]  HPI  82 year old patient generally enjoys excellent health.  He was seen for his annual exam in December of last year and doing quite well. He was seen in follow-up on March 27 with fatigue and myalgias.  When seen for reassessment on April 9 he seemed to be improved.  Subsequently he has had a relapse and was seen in the ED 3 days ago.  He continues to complain of profound weakness associated with myalgias and frequent night sweats.  He has had a significant weight loss with poor appetite.  His weight has decreased from 198 pounds in August 2 182 pounds today. There is been no documented fever. He has night sweats but denies any daytime sweating or flushing.  He is taken some occasional laxatives for constipation but has had no diarrhea.  He denies any tick exposure or rash.  General laboratory screen including CBC  And thyroid function studies have been normal.  3 days ago he had a slight bump in his BUN and creatinine.  In March vitamin D level was noted to be low  Past Medical History:  Diagnosis Date  . ANEMIA-IRON DEFICIENCY 06/23/2007  . BENIGN PROSTATIC HYPERTROPHY 06/23/2007  . GERD 06/23/2007  . Hypertension      Social History   Socioeconomic History  . Marital status: Married    Spouse name: Not on file  . Number of children: Not on file  . Years of education: Not on file  . Highest education level: Not on file  Occupational History  . Not on file  Social Needs  . Financial resource strain: Not on file  . Food insecurity:    Worry: Not on file    Inability: Not on file  . Transportation needs:    Medical: Not on file    Non-medical: Not on file  Tobacco Use  . Smoking status: Never Smoker  . Smokeless tobacco: Never Used  Substance and Sexual Activity  . Alcohol use: No  . Drug use: No  . Sexual activity: Not on file  Lifestyle  . Physical activity:    Days per  week: Not on file    Minutes per session: Not on file  . Stress: Not on file  Relationships  . Social connections:    Talks on phone: Not on file    Gets together: Not on file    Attends religious service: Not on file    Active member of club or organization: Not on file    Attends meetings of clubs or organizations: Not on file    Relationship status: Not on file  . Intimate partner violence:    Fear of current or ex partner: Not on file    Emotionally abused: Not on file    Physically abused: Not on file    Forced sexual activity: Not on file  Other Topics Concern  . Not on file  Social History Narrative  . Not on file    Past Surgical History:  Procedure Laterality Date  . HAND SURGERY      Family History  Problem Relation Age of Onset  . Stroke Brother   . Cancer Brother        prostate ca    Allergies  Allergen Reactions  . Codeine Phosphate     Current Outpatient Medications on File Prior to Visit  Medication Sig Dispense Refill  .  aspirin EC 81 MG tablet Take 1 tablet (81 mg total) by mouth daily.    . pantoprazole (PROTONIX) 40 MG tablet Take 1 tablet (40 mg total) by mouth daily as needed. 90 tablet 3   No current facility-administered medications on file prior to visit.     BP 130/72 (BP Location: Right Arm, Patient Position: Sitting, Cuff Size: Large)   Pulse 68   Temp 98.6 F (37 C) (Oral)   Wt 182 lb (82.6 kg)   SpO2 100%   BMI 24.68 kg/m     Review of Systems  Constitutional: Positive for activity change, appetite change, diaphoresis, fatigue and unexpected weight change. Negative for chills and fever.  HENT: Negative for congestion, dental problem, ear pain, hearing loss, sore throat, tinnitus, trouble swallowing and voice change.   Eyes: Negative for pain, discharge and visual disturbance.  Respiratory: Negative for cough, chest tightness, wheezing and stridor.   Cardiovascular: Negative for chest pain, palpitations and leg swelling.    Gastrointestinal: Positive for constipation. Negative for abdominal distention, abdominal pain, blood in stool, diarrhea, nausea and vomiting.  Genitourinary: Negative for difficulty urinating, discharge, flank pain, genital sores, hematuria and urgency.  Musculoskeletal: Positive for arthralgias and myalgias. Negative for back pain, gait problem, joint swelling and neck stiffness.  Skin: Negative for rash.  Neurological: Positive for weakness. Negative for dizziness, syncope, speech difficulty, numbness and headaches.  Hematological: Negative for adenopathy. Does not bruise/bleed easily.  Psychiatric/Behavioral: Positive for sleep disturbance. Negative for behavioral problems and dysphoric mood. The patient is not nervous/anxious.        Objective:   Physical Exam  Constitutional: He is oriented to person, place, and time. He appears well-developed and well-nourished.  Appears weak but in no acute distress.  Blood pressure 120/70 pulse 70 Oxygen saturation 98%-100%  HENT:  Head: Normocephalic.  Right Ear: External ear normal.  Left Ear: External ear normal.  Eyes: Conjunctivae and EOM are normal.  Neck: Normal range of motion.  No adenopathy  Cardiovascular: Normal rate and normal heart sounds.  No murmur heard. Pulmonary/Chest: Breath sounds normal.  Abdominal: Bowel sounds are normal.  Musculoskeletal: Normal range of motion. He exhibits no edema or tenderness.  Neurological: He is alert and oriented to person, place, and time.  Skin: No rash noted.  No clubbing  Psychiatric: He has a normal mood and affect. His behavior is normal.          Assessment & Plan:   Night sweats weight loss myalgias.   Vitamin D deficiency History of hypertension Increased BUN/creatinine  We will check a CRP as well as a sed rate.  Additionally will check a PSA as well as a PPD skin test. We will perform a chest CT abdominal and pelvic scan to screen for lymphoma or solid tumor such as  renal cancer Follow-up 1 week   Hydrochlorothiazide will be discontinued  Nyoka Cowden

## 2018-03-09 NOTE — Progress Notes (Signed)
PPD Placement note Alex Dunn, 82 y.o. male is here today for placement of PPD test Reason for PPD test:  Per Dr. Raliegh Ip Pt taken PPD test before: no  P:  PPD placed on 03/09/2018.  Patient advised to return for reading within 48-72 hours.

## 2018-03-09 NOTE — Patient Instructions (Signed)
Discontinue hydrochlorothiazide  Imaging studies as discussed  Return in 1 week for follow-up  Keep a log of your daily temperature

## 2018-03-10 LAB — SEDIMENTATION RATE: SED RATE: 6 mm/h (ref 0–20)

## 2018-03-10 LAB — HIGH SENSITIVITY CRP: CRP HIGH SENSITIVITY: 1.59 mg/L (ref 0.000–5.000)

## 2018-03-10 LAB — PSA: PSA: 2.37 ng/mL (ref 0.10–4.00)

## 2018-03-13 ENCOUNTER — Ambulatory Visit: Payer: Medicare Other | Admitting: Cardiology

## 2018-03-13 ENCOUNTER — Encounter: Payer: Self-pay | Admitting: Cardiology

## 2018-03-13 VITALS — BP 142/70 | HR 76 | Ht 72.0 in | Wt 183.8 lb

## 2018-03-13 DIAGNOSIS — R5383 Other fatigue: Secondary | ICD-10-CM | POA: Diagnosis not present

## 2018-03-13 DIAGNOSIS — R61 Generalized hyperhidrosis: Secondary | ICD-10-CM | POA: Diagnosis not present

## 2018-03-13 DIAGNOSIS — R531 Weakness: Secondary | ICD-10-CM

## 2018-03-13 DIAGNOSIS — I10 Essential (primary) hypertension: Secondary | ICD-10-CM

## 2018-03-13 DIAGNOSIS — R5382 Chronic fatigue, unspecified: Secondary | ICD-10-CM | POA: Insufficient documentation

## 2018-03-13 HISTORY — DX: Weakness: R53.1

## 2018-03-13 HISTORY — DX: Generalized hyperhidrosis: R61

## 2018-03-13 NOTE — Progress Notes (Signed)
Cardiology Consultation:    Date:  03/13/2018   ID:  Alex Dunn, DOB [birth year], MRN [id number]  PCP:  Marletta Lor, MD  Cardiologist:  Jenne Campus, MD   Referring MD: Marletta Lor, MD   Chief Complaint  Patient presents with  . Follow up from ED  I am weak and tired  History of Present Illness:    Alex Dunn is a 82 y.o. male who is being seen today for the evaluation of weakness fatigue and tiredness at the request of Marletta Lor, MD.  Is a very healthy gentleman.  He is being very active however for the last month or 2 he experienced profound fatigue tiredness also night sweats.  On top of that he lost some weight quite extensive evaluation has been done and so far no source of his trouble has been identified.  He denies have any typical cardiac complaints.  But he cannot do much because of fatigue tiredness and weakness.  He wakes up in the middle of night with sweating.  There is no chest pain tightness squeezing pressure burning chest no swelling of lower extremities no proximal nocturnal dyspnea.  Never had any heart trouble but have multiple family members who had premature coronary artery disease.  He was sent to [place] for evaluation for potential cardiac problem.  Past Medical History:  Diagnosis Date  . ANEMIA-IRON DEFICIENCY 06/23/2007  . BENIGN PROSTATIC HYPERTROPHY 06/23/2007  . GERD 06/23/2007  . Hypertension     Past Surgical History:  Procedure Laterality Date  . HAND SURGERY      Current Medications: Current Meds  Medication Sig  . aspirin EC 81 MG tablet Take 1 tablet (81 mg total) by mouth daily.  . ergocalciferol (VITAMIN D2) 50000 units capsule Take 1 capsule (50,000 Units total) by mouth once a week.  . pantoprazole (PROTONIX) 40 MG tablet Take 1 tablet (40 mg total) by mouth daily as needed.     Allergies:   Codeine phosphate   Social History   Socioeconomic History  . Marital status: Married    Spouse name:  Not on file  . Number of children: Not on file  . Years of education: Not on file  . Highest education level: Not on file  Occupational History  . Not on file  Social Needs  . Financial resource strain: Not on file  . Food insecurity:    Worry: Not on file    Inability: Not on file  . Transportation needs:    Medical: Not on file    Non-medical: Not on file  Tobacco Use  . Smoking status: Never Smoker  . Smokeless tobacco: Never Used  Substance and Sexual Activity  . Alcohol use: No  . Drug use: No  . Sexual activity: Not on file  Lifestyle  . Physical activity:    Days per week: Not on file    Minutes per session: Not on file  . Stress: Not on file  Relationships  . Social connections:    Talks on phone: Not on file    Gets together: Not on file    Attends religious service: Not on file    Active member of club or organization: Not on file    Attends meetings of clubs or organizations: Not on file    Relationship status: Not on file  Other Topics Concern  . Not on file  Social History Narrative  . Not on file     Family  History: The patient's family history includes Cancer in his brother; Stroke in his brother. ROS:   Please see the history of present illness.    All 14 point review of systems negative except as described per history of present illness.  EKGs/Labs/Other Studies Reviewed:    The following studies were reviewed today: All laboratory tests reviewed including EKG which showed normal sinus rhythm normal P interval poor R wave progression anterior precordium    Recent Labs: 01/28/2018: TSH 2.78 03/06/2018: ALT 13; BUN 25; Creatinine, Ser 1.46; Hemoglobin 15.5; Platelets 212; Potassium 3.9; Sodium 139  Recent Lipid Panel    Component Value Date/Time   CHOL 159 10/21/2017 0907   TRIG 79.0 10/21/2017 0907   HDL 40.30 10/21/2017 0907   CHOLHDL 4 10/21/2017 0907   VLDL 15.8 10/21/2017 0907   LDLCALC 103 (H) 10/21/2017 0907    Physical Exam:     VS:  BP (!) 142/70 (BP Location: Right Arm)   Pulse 76   Ht 6' (1.829 m)   Wt 183 lb 12.8 oz (83.4 kg)   SpO2 98%   BMI 24.93 kg/m     Wt Readings from Last 3 Encounters:  03/13/18 183 lb 12.8 oz (83.4 kg)  03/09/18 182 lb (82.6 kg)  03/06/18 189 lb (85.7 kg)     GEN:  Well nourished, well developed in no acute distress HEENT: Normal NECK: No JVD; No carotid bruits LYMPHATICS: No lymphadenopathy CARDIAC: RRR, no murmurs, no rubs, no gallops RESPIRATORY:  Clear to auscultation without rales, wheezing or rhonchi  ABDOMEN: Soft, non-tender, non-distended MUSCULOSKELETAL:  No edema; No deformity  SKIN: Warm and dry NEUROLOGIC:  Alert and oriented x 3 PSYCHIATRIC:  Normal affect   ASSESSMENT:    1. Essential hypertension   2. Night sweats   3. Fatigue, unspecified type    PLAN:    In order of problems listed above:  1. Constellation of very atypical symptoms.  Less likely my opinion to be related to his heart.  However I think it still will be reasonable to perform an echocardiogram to assess his left ventricular ejection fraction.  Because of night sweats I will make sure he does not have significant bradycardia therefore I will ask him to wear 2 weeks long-term monitor.  He said those episode of night sweats happening maybe once a week and may be every 2 weeks.  Hopefully will be able to see exactly what kind of rhythm issue he has is on it.  As a part of evaluation I will ask him to have a stress echocardiogram however again I doubt very much that his symptoms are related to his heart.  In my opinion investigation need to continue.  He probably need to see GI specialist with consideration of gastroscopy or colonoscopy.  My understanding is that he is we will be scheduled to have CT of his abdomen and chest which in my opinion is excellent idea.  Again his symptoms are very worrisome weight loss and night sweats profound weakness need to be aggressively investigated.  I see him  back in my office in about 6 weeks or sooner if he has a problem   Medication Adjustments/Labs and Tests Ordered: Current medicines are reviewed at length with the patient today.  Concerns regarding medicines are outlined above.  No orders of the defined types were placed in this encounter.  No orders of the defined types were placed in this encounter.   Signed, Park Liter, MD, [facility]. 03/13/2018 8:31  AM    [place]

## 2018-03-13 NOTE — Patient Instructions (Signed)
Medication Instructions:  Your physician recommends that you continue on your current medications as directed. Please refer to the Current Medication list given to you today.   Labwork: None  Testing/Procedures: Your physician has requested that you have an echocardiogram. Echocardiography is a painless test that uses sound waves to create images of your heart. It provides your doctor with information about the size and shape of your heart and how well your heart's chambers and valves are working. This procedure takes approximately one hour. There are no restrictions for this procedure.  Your physician has requested that you have a stress echocardiogram. For further information please visit [email or web address]. Please follow instruction sheet as given.  Your physician has recommended that you wear a holter monitor. Holter monitors are medical devices that record the heart's electrical activity. Doctors most often use these monitors to diagnose arrhythmias. Arrhythmias are problems with the speed or rhythm of the heartbeat. The monitor is a small, portable device. You can wear one while you do your normal daily activities. This is usually used to diagnose what is causing palpitations/syncope (passing out). Wear for 14 days.   Follow-Up: Your physician recommends that you schedule a follow-up appointment in: 6 weeks.   Any Other Special Instructions Will Be Listed Below (If Applicable).     If you need a refill on your cardiac medications before your next appointment, please call your pharmacy.

## 2018-03-16 ENCOUNTER — Encounter: Payer: Self-pay | Admitting: Internal Medicine

## 2018-03-16 ENCOUNTER — Ambulatory Visit: Payer: Medicare Other | Admitting: Internal Medicine

## 2018-03-16 VITALS — BP 130/50 | HR 79 | Temp 98.8°F | Wt 185.0 lb

## 2018-03-16 DIAGNOSIS — R61 Generalized hyperhidrosis: Secondary | ICD-10-CM | POA: Diagnosis not present

## 2018-03-16 DIAGNOSIS — I10 Essential (primary) hypertension: Secondary | ICD-10-CM

## 2018-03-16 DIAGNOSIS — N182 Chronic kidney disease, stage 2 (mild): Secondary | ICD-10-CM

## 2018-03-16 NOTE — Progress Notes (Signed)
Subjective:    Patient ID: Alex Dunn, male    DOB: October 07, 1936, 82 y.o.   MRN: [id number]  HPI  82 year old patient who is seen today for follow-up of a number of striking constitutional complaints.  He continues to have profound weakness and fatigue.  He has been able to do some yard work but very limited and tires quite easily.  He has had some significant weight loss but since his last visit here weight is up modestly.  Chest x-ray laboratory screen and PPD skin test have been unremarkable. The ED referred the patient to cardiology to rule out cardiac etiology.  A 2D echocardiogram to assess LV function, 2 weeks long-term monitoring and a stress echo are pending.  Chest abdominal and CT scanning have been ordered but have not been performed.  He states he becomes profoundly weak following a bowel movement which have been normal and well formed he continues to have occasional night sweats but not for about a week and a half.  Today he states that he had some chills last night which are infrequent.  He was recently placed on vitamin D due to document of vitamin D deficiency but otherwise takes no chronic medications he has discontinued aspirin and takes rare Protonix. There has been no tick exposure or pertinent travel history.   Past Medical History:  Diagnosis Date  . ANEMIA-IRON DEFICIENCY 06/23/2007  . BENIGN PROSTATIC HYPERTROPHY 06/23/2007  . GERD 06/23/2007  . Hypertension      Social History   Socioeconomic History  . Marital status: Married    Spouse name: Not on file  . Number of children: Not on file  . Years of education: Not on file  . Highest education level: Not on file  Occupational History  . Not on file  Social Needs  . Financial resource strain: Not on file  . Food insecurity:    Worry: Not on file    Inability: Not on file  . Transportation needs:    Medical: Not on file    Non-medical: Not on file  Tobacco Use  . Smoking status: Never Smoker  .  Smokeless tobacco: Never Used  Substance and Sexual Activity  . Alcohol use: No  . Drug use: No  . Sexual activity: Not on file  Lifestyle  . Physical activity:    Days per week: Not on file    Minutes per session: Not on file  . Stress: Not on file  Relationships  . Social connections:    Talks on phone: Not on file    Gets together: Not on file    Attends religious service: Not on file    Active member of club or organization: Not on file    Attends meetings of clubs or organizations: Not on file    Relationship status: Not on file  . Intimate partner violence:    Fear of current or ex partner: Not on file    Emotionally abused: Not on file    Physically abused: Not on file    Forced sexual activity: Not on file  Other Topics Concern  . Not on file  Social History Narrative  . Not on file    Past Surgical History:  Procedure Laterality Date  . HAND SURGERY      Family History  Problem Relation Age of Onset  . Stroke Brother   . Cancer Brother        prostate ca    Allergies  Allergen Reactions  .  Codeine Phosphate     Current Outpatient Medications on File Prior to Visit  Medication Sig Dispense Refill  . aspirin EC 81 MG tablet Take 1 tablet (81 mg total) by mouth daily.    . ergocalciferol (VITAMIN D2) 50000 units capsule Take 1 capsule (50,000 Units total) by mouth once a week. 12 capsule 0  . pantoprazole (PROTONIX) 40 MG tablet Take 1 tablet (40 mg total) by mouth daily as needed. 90 tablet 3   No current facility-administered medications on file prior to visit.     BP (!) 130/50 (BP Location: Right Arm, Patient Position: Sitting, Cuff Size: Large)   Pulse 79   Temp 98.8 F (37.1 C) (Oral)   Wt 185 lb (83.9 kg)   SpO2 97%   BMI 25.09 kg/m        Review of Systems  Constitutional: Positive for activity change, appetite change, chills, diaphoresis, fatigue and unexpected weight change. Negative for fever.  HENT: Negative for congestion,  dental problem, ear pain, hearing loss, sore throat, tinnitus, trouble swallowing and voice change.        Brief history of rhinorrhea has resolved  Eyes: Negative for pain, discharge and visual disturbance.  Respiratory: Negative for cough, chest tightness, wheezing and stridor.   Cardiovascular: Negative for chest pain, palpitations and leg swelling.  Gastrointestinal: Negative for abdominal distention, abdominal pain, blood in stool, constipation, diarrhea, nausea and vomiting.       No organomegaly or lymphadenopathy  Genitourinary: Negative for difficulty urinating, discharge, flank pain, genital sores, hematuria and urgency.  Musculoskeletal: Negative for arthralgias, back pain, gait problem, joint swelling, myalgias and neck stiffness.  Skin: Negative for rash.       No rash  Neurological: Positive for weakness. Negative for dizziness, syncope, speech difficulty, numbness and headaches.  Hematological: Negative for adenopathy. Does not bruise/bleed easily.  Psychiatric/Behavioral: Negative for behavioral problems and dysphoric mood. The patient is not nervous/anxious.        Objective:   Physical Exam  Constitutional: He is oriented to person, place, and time. He appears well-developed.  HENT:  Head: Normocephalic.  Right Ear: External ear normal.  Left Ear: External ear normal.  Eyes: Conjunctivae and EOM are normal.  Neck: Normal range of motion.  No lymphadenopathy  Cardiovascular: Normal rate and normal heart sounds.  Pulmonary/Chest: Breath sounds normal.  Abdominal: Bowel sounds are normal.  No organomegaly  Musculoskeletal: Normal range of motion. He exhibits no edema or tenderness.  Neurological: He is alert and oriented to person, place, and time.  Skin:  No rash  Psychiatric: He has a normal mood and affect. His behavior is normal.          Assessment & Plan:   Profound constitutional complaints in a patient with an unremarkable clinical exam and  laboratory screen.  Stress echo and 2-week rhythm monitoring pending We will expedite CT of the chest abdomen and pelvis to rule out lymphoma or solid tumor Check a set of blood cultures today  Depending on the above we will consider ID or GI referral  Nyoka Cowden

## 2018-03-16 NOTE — Patient Instructions (Signed)
Imaging studies as discussed  Report any new or worsening symptoms   follow-up in 2 weeks

## 2018-03-19 ENCOUNTER — Ambulatory Visit
Admission: RE | Admit: 2018-03-19 | Discharge: 2018-03-19 | Disposition: A | Discharge status: Home / Self Care | Payer: Medicare Other | Source: Ambulatory Visit | Attending: Internal Medicine | Admitting: Internal Medicine

## 2018-03-19 ENCOUNTER — Other Ambulatory Visit: Payer: Medicare Other

## 2018-03-19 DIAGNOSIS — N281 Cyst of kidney, acquired: Secondary | ICD-10-CM | POA: Diagnosis not present

## 2018-03-19 DIAGNOSIS — R634 Abnormal weight loss: Secondary | ICD-10-CM

## 2018-03-19 DIAGNOSIS — J9811 Atelectasis: Secondary | ICD-10-CM | POA: Diagnosis not present

## 2018-03-19 DIAGNOSIS — K5790 Diverticulosis of intestine, part unspecified, without perforation or abscess without bleeding: Secondary | ICD-10-CM | POA: Diagnosis not present

## 2018-03-22 LAB — CULTURE, BLOOD (SINGLE)
MICRO NUMBER: 90579922
MICRO NUMBER:: 90579925

## 2018-04-02 ENCOUNTER — Ambulatory Visit (HOSPITAL_BASED_OUTPATIENT_CLINIC_OR_DEPARTMENT_OTHER)
Admission: RE | Admit: 2018-04-02 | Discharge: 2018-04-02 | Disposition: A | Discharge status: Home / Self Care | Payer: Medicare Other | Source: Ambulatory Visit | Attending: Internal Medicine | Admitting: Internal Medicine

## 2018-04-02 DIAGNOSIS — R5383 Other fatigue: Secondary | ICD-10-CM | POA: Diagnosis not present

## 2018-04-02 DIAGNOSIS — R61 Generalized hyperhidrosis: Secondary | ICD-10-CM | POA: Insufficient documentation

## 2018-04-02 DIAGNOSIS — I10 Essential (primary) hypertension: Secondary | ICD-10-CM | POA: Insufficient documentation

## 2018-04-02 DIAGNOSIS — I081 Rheumatic disorders of both mitral and tricuspid valves: Secondary | ICD-10-CM | POA: Insufficient documentation

## 2018-04-02 NOTE — Progress Notes (Signed)
  Echocardiogram 2D Echocardiogram has been performed.  Alex Dunn 04/02/2018, 2:12 PM

## 2018-04-03 ENCOUNTER — Ambulatory Visit: Payer: Medicare Other

## 2018-04-03 ENCOUNTER — Ambulatory Visit (HOSPITAL_BASED_OUTPATIENT_CLINIC_OR_DEPARTMENT_OTHER)
Admission: RE | Admit: 2018-04-03 | Discharge: 2018-04-03 | Disposition: A | Discharge status: Home / Self Care | Payer: Medicare Other | Source: Ambulatory Visit

## 2018-04-03 DIAGNOSIS — I10 Essential (primary) hypertension: Secondary | ICD-10-CM | POA: Diagnosis not present

## 2018-04-03 DIAGNOSIS — R61 Generalized hyperhidrosis: Secondary | ICD-10-CM | POA: Diagnosis not present

## 2018-04-03 DIAGNOSIS — R5383 Other fatigue: Secondary | ICD-10-CM | POA: Diagnosis not present

## 2018-04-03 NOTE — Progress Notes (Deleted)
  Echocardiogram 2D Echocardiogram has been performed.  Joelene Millin 04/03/2018, 11:20 AM

## 2018-04-03 NOTE — Progress Notes (Signed)
Echocardiogram Echocardiogram Stress Test has been performed.  Alex Dunn 04/03/2018, 12:23 PM

## 2018-04-06 ENCOUNTER — Ambulatory Visit: Payer: Medicare Other | Admitting: Internal Medicine

## 2018-04-06 ENCOUNTER — Encounter: Payer: Self-pay | Admitting: Internal Medicine

## 2018-04-06 VITALS — BP 140/60 | HR 70 | Temp 98.2°F | Wt 188.0 lb

## 2018-04-06 DIAGNOSIS — Q613 Polycystic kidney, unspecified: Secondary | ICD-10-CM | POA: Diagnosis not present

## 2018-04-06 DIAGNOSIS — Q6102 Congenital multiple renal cysts: Secondary | ICD-10-CM | POA: Insufficient documentation

## 2018-04-06 DIAGNOSIS — N182 Chronic kidney disease, stage 2 (mild): Secondary | ICD-10-CM

## 2018-04-06 DIAGNOSIS — R5383 Other fatigue: Secondary | ICD-10-CM

## 2018-04-06 DIAGNOSIS — Z1211 Encounter for screening for malignant neoplasm of colon: Secondary | ICD-10-CM | POA: Diagnosis not present

## 2018-04-06 HISTORY — DX: Congenital multiple renal cysts: Q61.02

## 2018-04-06 MED ORDER — ESCITALOPRAM OXALATE 5 MG PO TABS: 5.0000 mg | ORAL_TABLET | Freq: Every day | ORAL | 1 refills | Status: DC

## 2018-04-06 NOTE — Patient Instructions (Signed)
Moderate your fluid intake after late afternoon  Nephrology consultation as discussed  Cologuard testing  Please return slides to check for hidden blood  Return in 6 weeks for follow-up or sooner if you develop any new or worsening symptoms

## 2018-04-06 NOTE — Progress Notes (Signed)
Subjective:    Patient ID: Alex Dunn, male    DOB: 06/23/1936, 82 y.o.   MRN: [id number]  HPI  Wt Readings from Last 3 Encounters:  04/06/18 188 lb (85.3 kg)  03/16/18 185 lb (83.9 kg)  03/13/18 183 lb 12.8 oz (59.50 kg)   82 year old patient who is seen today in follow-up. He has had a history of extreme fatigue associated with some earlier weight loss.  Over the past month his weight continues to improve In general he feels better.  He had some night sweats and chills earlier that have resolved  Imaging studies have included a CT of the abdomen and pelvis consistent with polycystic kidney disease.  He has a long history of stable chronic kidney disease.  Last GFR 51  He has a history of hypertension which has been well controlled off medication.  He is accompanied by his wife today who is very frustrated about lack of a diagnosis.  Evaluation has included a stress echocardiogram that was normal  Imaging studies have revealed coronary artery and generalized atherosclerotic changes implications discussed today.  Statin therapy discussed but will be considered later  Denies any change in his bowel habits.  He states that he is sleeping very poorly due to polyuria.  He has been forcing fluids.  He states that he generally feels better and 2 days ago spent a considerable amount of time working outdoors.  He states that he feels poorly even when he awakes in the morning.  No history of depression.  The patient nor his wife feels that he is clinically depressed.  He states that at times he becomes dizzy and usually blood pressure is minimally elevated to correspond to that symptom.  Status post recent ILR     Review of Systems  Constitutional: Positive for fatigue. Negative for appetite change, chills and fever.  HENT: Negative for congestion, dental problem, ear pain, hearing loss, sore throat, tinnitus, trouble swallowing and voice change.   Eyes: Negative for pain,  discharge and visual disturbance.  Respiratory: Negative for cough, chest tightness, wheezing and stridor.   Cardiovascular: Negative for chest pain, palpitations and leg swelling.  Gastrointestinal: Negative for abdominal distention, abdominal pain, blood in stool, constipation, diarrhea, nausea and vomiting.  Endocrine: Positive for polyuria.  Genitourinary: Negative for difficulty urinating, discharge, flank pain, genital sores, hematuria and urgency.  Musculoskeletal: Negative for arthralgias, back pain, gait problem, joint swelling, myalgias and neck stiffness.  Skin: Negative for rash.  Neurological: Positive for dizziness and weakness. Negative for syncope, speech difficulty, numbness and headaches.  Hematological: Negative for adenopathy. Does not bruise/bleed easily.  Psychiatric/Behavioral: Negative for behavioral problems and dysphoric mood. The patient is not nervous/anxious.        Objective:   Physical Exam  Constitutional: He is oriented to person, place, and time. He appears well-developed.  Blood pressure 120/68 Weight 188  HENT:  Head: Normocephalic.  Right Ear: External ear normal.  Left Ear: External ear normal.  Eyes: Conjunctivae and EOM are normal.  Neck: Normal range of motion.  Cardiovascular: Normal rate and normal heart sounds.  Pulmonary/Chest: Breath sounds normal.  Abdominal: Bowel sounds are normal.  Musculoskeletal: Normal range of motion. He exhibits no edema or tenderness.  Neurological: He is alert and oriented to person, place, and time.  Psychiatric: He has a normal mood and affect. His behavior is normal.          Assessment & Plan:   Chronic fatigue.  Modestly improved  History of weight loss/night sweats.  These  symptoms presently stable Polycystic kidney disease/chronic kidney disease.  History of fairly large renal cyst doubt occult infection of the cyst could be a cause of fatigue in view of normal white count and inflammatory  indices.  Nephrology consultation Possible depression.  Trial Lexapro 5.  Follow-up 6 weeks  Polyuria.  Patient will moderate fluid intake in the late afternoon  Follow-up 6 weeks or earlier if he develops any new or worsening symptoms Hemoccult slides x4 Cologuard screening  Nyoka Cowden

## 2018-04-08 ENCOUNTER — Telehealth: Payer: Self-pay | Admitting: Cardiology

## 2018-04-08 NOTE — Telephone Encounter (Signed)
Has questions about the CT scan

## 2018-04-08 NOTE — Telephone Encounter (Signed)
Advised patient to contact his PCP regarding CT results since they ordered this testing. Patient verbalized understanding. No further questions.

## 2018-04-13 ENCOUNTER — Telehealth: Payer: Self-pay

## 2018-04-13 NOTE — Telephone Encounter (Signed)
Copied from [place] [id number]. Topic: Referral - Request >> Apr 13, 2018  1:50 PM Marin Olp L wrote: Reason for CRM: Patient was referred by Dr. Raliegh Ip to [place] for cysts on kidneys, however their office can not get him in for 3 to 6 months. Patient is very weak and needs a referral to another nephrologist as soon as possible.

## 2018-04-14 NOTE — Telephone Encounter (Signed)
I called the pt to ask if he wanted me to send his referral to  [facility] or [facility] for  NEPHROLOGY it  could take up to 3-6 months  to be seen . The patient stated he has an appointment with the Dr and he will speak with the Dr about this when he comes in . I explain the referral process to the pt .. I call [facility] I was told that the patient has been rated a #4 and he would be scheduled in about a month . I called pt  To inform left a msg on both number [phone] To inform him the status of his referral and if he had any questions he may contact [facility] at [phone].

## 2018-04-14 NOTE — Telephone Encounter (Signed)
Deb, can we see if there is somewhere else that can see pt sooner? Thanks!

## 2018-04-15 ENCOUNTER — Ambulatory Visit: Payer: Medicare Other | Admitting: Internal Medicine

## 2018-04-15 ENCOUNTER — Encounter: Payer: Self-pay | Admitting: Internal Medicine

## 2018-04-15 VITALS — BP 142/70 | HR 59 | Temp 98.8°F | Wt 184.0 lb

## 2018-04-15 DIAGNOSIS — K5790 Diverticulosis of intestine, part unspecified, without perforation or abscess without bleeding: Secondary | ICD-10-CM | POA: Insufficient documentation

## 2018-04-15 DIAGNOSIS — I700 Atherosclerosis of aorta: Secondary | ICD-10-CM

## 2018-04-15 DIAGNOSIS — Z1212 Encounter for screening for malignant neoplasm of rectum: Secondary | ICD-10-CM | POA: Diagnosis not present

## 2018-04-15 DIAGNOSIS — I10 Essential (primary) hypertension: Secondary | ICD-10-CM | POA: Insufficient documentation

## 2018-04-15 DIAGNOSIS — R634 Abnormal weight loss: Secondary | ICD-10-CM

## 2018-04-15 DIAGNOSIS — Z87898 Personal history of other specified conditions: Secondary | ICD-10-CM

## 2018-04-15 DIAGNOSIS — R5383 Other fatigue: Secondary | ICD-10-CM | POA: Diagnosis not present

## 2018-04-15 DIAGNOSIS — J986 Disorders of diaphragm: Secondary | ICD-10-CM | POA: Insufficient documentation

## 2018-04-15 DIAGNOSIS — Z1211 Encounter for screening for malignant neoplasm of colon: Secondary | ICD-10-CM | POA: Diagnosis not present

## 2018-04-15 HISTORY — DX: Atherosclerosis of aorta: I70.0

## 2018-04-15 HISTORY — DX: Disorders of diaphragm: J98.6

## 2018-04-15 HISTORY — DX: Diverticulosis of intestine, part unspecified, without perforation or abscess without bleeding: K57.90

## 2018-04-15 HISTORY — DX: Essential (primary) hypertension: I10

## 2018-04-15 LAB — COMPREHENSIVE METABOLIC PANEL
ALBUMIN: 3.9 g/dL (ref 3.5–5.2)
ALK PHOS: 79 U/L (ref 39–117)
ALT: 9 U/L (ref 0–53)
AST: 13 U/L (ref 0–37)
BILIRUBIN TOTAL: 0.6 mg/dL (ref 0.2–1.2)
BUN: 19 mg/dL (ref 6–23)
CO2: 27 mEq/L (ref 19–32)
Calcium: 9.2 mg/dL (ref 8.4–10.5)
Chloride: 104 mEq/L (ref 96–112)
Creatinine, Ser: 1.36 mg/dL (ref 0.40–1.50)
GFR: 53.61 mL/min — ABNORMAL LOW (ref >60.00)
GLUCOSE: 91 mg/dL (ref 70–99)
Potassium: 4.4 mEq/L (ref 3.5–5.1)
SODIUM: 140 meq/L (ref 135–145)
TOTAL PROTEIN: 6.9 g/dL (ref 6.0–8.3)

## 2018-04-15 LAB — CBC WITH DIFFERENTIAL/PLATELET
BASOS ABS: 0.1 10*3/uL (ref 0.0–0.1)
Basophils Relative: 0.8 % (ref 0.0–3.0)
EOS ABS: 0.1 10*3/uL (ref 0.0–0.7)
Eosinophils Relative: 0.9 % (ref 0.0–5.0)
HCT: 44.3 % (ref 39.0–52.0)
HEMOGLOBIN: 14.9 g/dL (ref 13.0–17.0)
LYMPHS ABS: 1.5 10*3/uL (ref 0.7–4.0)
Lymphocytes Relative: 19.2 % (ref 12.0–46.0)
MCHC: 33.6 g/dL (ref 30.0–36.0)
MCV: 95.1 fl (ref 78.0–100.0)
Monocytes Absolute: 0.6 10*3/uL (ref 0.1–1.0)
Monocytes Relative: 8.2 % (ref 3.0–12.0)
NEUTROS PCT: 70.9 % (ref 43.0–77.0)
Neutro Abs: 5.4 10*3/uL (ref 1.4–7.7)
Platelets: 208 10*3/uL (ref 150.0–400.0)
RBC: 4.66 Mil/uL (ref 4.22–5.81)
RDW: 13.5 % (ref 11.5–15.5)
WBC: 7.6 10*3/uL (ref 4.0–10.5)

## 2018-04-15 LAB — HIGH SENSITIVITY CRP: CRP, High Sensitivity: 1.52 mg/L (ref 0.000–5.000)

## 2018-04-15 LAB — T4, FREE: FREE T4: 0.89 ng/dL (ref 0.60–1.60)

## 2018-04-15 NOTE — Patient Instructions (Addendum)
Report any new or worsening symptoms  Infectious disease consultation as discussed  Take a calcium supplement, plus [phone] units of vitamin D

## 2018-04-15 NOTE — Progress Notes (Signed)
Subjective:    Patient ID: Alex Dunn, male    DOB: [birth year], 82 y.o.   MRN: [id number]  HPI  82 year old patient who is seen today in follow-up for ongoing severe fatigue.  Symptoms have not been present for about 3-1/2 months He was seen 9 days ago and thought to be improving.  He had gained weight and had been much more active physically. Previous chills have resolved;  for the past few days he describes a generalized achiness profound weakness.  He also describes mild headache and his wife feels that his color was poor.  He states that he felt so poorly 2 days ago he almost called EMS for transport to the ED his appetite has been poor for the past 2 days. No other new symptoms.  He has had some occasional tick exposure but this has occurred following the onset of his symptoms.  Denies any rash except for an occasional rash involving both heels that has resolved  He returns to Hemoccult slides today both of which were negative for occult blood.  Cologuard test is pending  Nephrology consultation for polycystic kidney disease pending  Past Medical History:  Diagnosis Date  . ANEMIA-IRON DEFICIENCY 06/23/2007  . BENIGN PROSTATIC HYPERTROPHY 06/23/2007  . GERD 06/23/2007  . Hypertension      Social History   Socioeconomic History  . Marital status: Married    Spouse name: Not on file  . Number of children: Not on file  . Years of education: Not on file  . Highest education level: Not on file  Occupational History  . Not on file  Social Needs  . Financial resource strain: Not on file  . Food insecurity:    Worry: Not on file    Inability: Not on file  . Transportation needs:    Medical: Not on file    Non-medical: Not on file  Tobacco Use  . Smoking status: Never Smoker  . Smokeless tobacco: Never Used  Substance and Sexual Activity  . Alcohol use: No  . Drug use: No  . Sexual activity: Not on file  Lifestyle  . Physical activity:    Days per week: Not on file     Minutes per session: Not on file  . Stress: Not on file  Relationships  . Social connections:    Talks on phone: Not on file    Gets together: Not on file    Attends religious service: Not on file    Active member of club or organization: Not on file    Attends meetings of clubs or organizations: Not on file    Relationship status: Not on file  . Intimate partner violence:    Fear of current or ex partner: Not on file    Emotionally abused: Not on file    Physically abused: Not on file    Forced sexual activity: Not on file  Other Topics Concern  . Not on file  Social History Narrative  . Not on file    Past Surgical History:  Procedure Laterality Date  . HAND SURGERY      Family History  Problem Relation Age of Onset  . Stroke Brother   . Cancer Brother        prostate ca    Allergies  Allergen Reactions  . Codeine Phosphate     Current Outpatient Medications on File Prior to Visit  Medication Sig Dispense Refill  . aspirin EC 81 MG tablet Take 1 tablet (  81 mg total) by mouth daily.    . ergocalciferol (VITAMIN D2) 50000 units capsule Take 1 capsule (50,000 Units total) by mouth once a week. 12 capsule 0  . escitalopram (LEXAPRO) 5 MG tablet Take 1 tablet (5 mg total) by mouth daily. 60 tablet 1  . pantoprazole (PROTONIX) 40 MG tablet Take 1 tablet (40 mg total) by mouth daily as needed. 90 tablet 3   No current facility-administered medications on file prior to visit.     BP (!) 142/70 (BP Location: Right Arm, Patient Position: Sitting, Cuff Size: Large)   Pulse (!) 59   Temp 98.8 F (37.1 C) (Oral)   Wt 184 lb (83.5 kg)   SpO2 99%   BMI 24.95 kg/m       Past Medical History:  Diagnosis Date  . ANEMIA-IRON DEFICIENCY 06/23/2007  . BENIGN PROSTATIC HYPERTROPHY 06/23/2007  . GERD 06/23/2007  . Hypertension      Social History   Socioeconomic History  . Marital status: Married    Spouse name: Not on file  . Number of children: Not on file  .  Years of education: Not on file  . Highest education level: Not on file  Occupational History  . Not on file  Social Needs  . Financial resource strain: Not on file  . Food insecurity:    Worry: Not on file    Inability: Not on file  . Transportation needs:    Medical: Not on file    Non-medical: Not on file  Tobacco Use  . Smoking status: Never Smoker  . Smokeless tobacco: Never Used  Substance and Sexual Activity  . Alcohol use: No  . Drug use: No  . Sexual activity: Not on file  Lifestyle  . Physical activity:    Days per week: Not on file    Minutes per session: Not on file  . Stress: Not on file  Relationships  . Social connections:    Talks on phone: Not on file    Gets together: Not on file    Attends religious service: Not on file    Active member of club or organization: Not on file    Attends meetings of clubs or organizations: Not on file    Relationship status: Not on file  . Intimate partner violence:    Fear of current or ex partner: Not on file    Emotionally abused: Not on file    Physically abused: Not on file    Forced sexual activity: Not on file  Other Topics Concern  . Not on file  Social History Narrative  . Not on file    Past Surgical History:  Procedure Laterality Date  . HAND SURGERY      Family History  Problem Relation Age of Onset  . Stroke Brother   . Cancer Brother        prostate ca    Allergies  Allergen Reactions  . Codeine Phosphate     Current Outpatient Medications on File Prior to Visit  Medication Sig Dispense Refill  . aspirin EC 81 MG tablet Take 1 tablet (81 mg total) by mouth daily.    . ergocalciferol (VITAMIN D2) 50000 units capsule Take 1 capsule (50,000 Units total) by mouth once a week. 12 capsule 0  . escitalopram (LEXAPRO) 5 MG tablet Take 1 tablet (5 mg total) by mouth daily. 60 tablet 1  . pantoprazole (PROTONIX) 40 MG tablet Take 1 tablet (40 mg total) by mouth daily  as needed. 90 tablet 3   No  current facility-administered medications on file prior to visit.     BP (!) 142/70 (BP Location: Right Arm, Patient Position: Sitting, Cuff Size: Large)   Pulse (!) 59   Temp 98.8 F (37.1 C) (Oral)   Wt 184 lb (83.5 kg)   SpO2 99%   BMI 24.95 kg/m      Review of Systems  Constitutional: Positive for activity change, appetite change, fatigue and unexpected weight change. Negative for chills and fever.  HENT: Negative for congestion, dental problem, ear pain, hearing loss, sore throat, tinnitus, trouble swallowing and voice change.   Eyes: Negative for pain, discharge and visual disturbance.  Respiratory: Negative for cough, chest tightness, wheezing and stridor.   Cardiovascular: Negative for chest pain, palpitations and leg swelling.  Gastrointestinal: Negative for abdominal distention, abdominal pain, blood in stool, constipation, diarrhea, nausea and vomiting.  Genitourinary: Negative for difficulty urinating, discharge, flank pain, genital sores, hematuria and urgency.  Musculoskeletal: Positive for myalgias. Negative for arthralgias, back pain, gait problem, joint swelling and neck stiffness.  Skin: Negative for rash.  Neurological: Positive for tremors, weakness and headaches. Negative for dizziness, syncope, speech difficulty and numbness.  Hematological: Negative for adenopathy. Does not bruise/bleed easily.  Psychiatric/Behavioral: Negative for behavioral problems and dysphoric mood. The patient is not nervous/anxious.        Objective:   Physical Exam  Constitutional: He is oriented to person, place, and time. He appears well-developed. No distress.  Afebrile No distress Blood pressure normal  HENT:  Head: Normocephalic.  Right Ear: External ear normal.  Left Ear: External ear normal.  Eyes: Conjunctivae and EOM are normal.  Neck: Normal range of motion.  Cardiovascular: Normal rate, regular rhythm and normal heart sounds.  Pulse slow and regular    Pulmonary/Chest: Breath sounds normal.  Abdominal: Bowel sounds are normal. He exhibits no distension and no mass. There is no tenderness. There is no guarding.  Musculoskeletal: Normal range of motion. He exhibits no edema or tenderness.  Neurological: He is alert and oriented to person, place, and time.  No hyperreflexia  Skin: No rash noted.  Psychiatric: He has a normal mood and affect. His behavior is normal.          Assessment & Plan:   Chronic fatigue History of chills and night sweats Intermittent weight loss  This man physical examination still nonrevealing   Check updated lab ID consult  Marletta Lor

## 2018-04-16 ENCOUNTER — Encounter (HOSPITAL_BASED_OUTPATIENT_CLINIC_OR_DEPARTMENT_OTHER): Payer: Self-pay | Admitting: *Deleted

## 2018-04-16 ENCOUNTER — Emergency Department (HOSPITAL_BASED_OUTPATIENT_CLINIC_OR_DEPARTMENT_OTHER)
Admission: EM | Admit: 2018-04-16 | Discharge: 2018-04-16 | Disposition: A | Discharge status: Home / Self Care | Payer: Medicare Other | Attending: Emergency Medicine | Admitting: Emergency Medicine

## 2018-04-16 ENCOUNTER — Other Ambulatory Visit: Payer: Self-pay

## 2018-04-16 DIAGNOSIS — Z79899 Other long term (current) drug therapy: Secondary | ICD-10-CM | POA: Diagnosis not present

## 2018-04-16 DIAGNOSIS — I129 Hypertensive chronic kidney disease with stage 1 through stage 4 chronic kidney disease, or unspecified chronic kidney disease: Secondary | ICD-10-CM | POA: Insufficient documentation

## 2018-04-16 DIAGNOSIS — N189 Chronic kidney disease, unspecified: Secondary | ICD-10-CM | POA: Diagnosis not present

## 2018-04-16 DIAGNOSIS — R531 Weakness: Secondary | ICD-10-CM | POA: Diagnosis not present

## 2018-04-16 DIAGNOSIS — R251 Tremor, unspecified: Secondary | ICD-10-CM | POA: Diagnosis not present

## 2018-04-16 DIAGNOSIS — R5383 Other fatigue: Secondary | ICD-10-CM | POA: Diagnosis not present

## 2018-04-16 LAB — URINALYSIS, ROUTINE W REFLEX MICROSCOPIC
Bilirubin Urine: NEGATIVE
Glucose, UA: NEGATIVE mg/dL
HGB URINE DIPSTICK: NEGATIVE
Ketones, ur: NEGATIVE mg/dL
Leukocytes, UA: NEGATIVE
NITRITE: NEGATIVE
PROTEIN: NEGATIVE mg/dL
SPECIFIC GRAVITY, URINE: 1.025 (ref 1.005–1.030)
pH: 6 (ref 5.0–8.0)

## 2018-04-16 LAB — HIV ANTIBODY (ROUTINE TESTING W REFLEX): HIV 1&2 Ab, 4th Generation: NONREACTIVE

## 2018-04-16 LAB — HEPATITIS C ANTIBODY
HEP C AB: NONREACTIVE
SIGNAL TO CUT-OFF: 0.02 (ref <1.00)

## 2018-04-16 LAB — CK TOTAL AND CKMB (NOT AT ARMC)
CK, MB: 1.3 ng/mL (ref 0–5.0)
RELATIVE INDEX: 3.3 (ref 0–4.0)
Total CK: 40 U/L — ABNORMAL LOW (ref 44–196)

## 2018-04-16 LAB — RPR: RPR Ser Ql: NONREACTIVE

## 2018-04-16 NOTE — ED Triage Notes (Signed)
Weakness x 3 days. Ambulatory and alert. States he has body aches. No appetite. His wife states these symptoms have been going on for 3 months. His MD has done extensive testing without an answer that would cause the symptoms.

## 2018-04-16 NOTE — ED Notes (Signed)
Pt on monitor 

## 2018-04-16 NOTE — ED Provider Notes (Signed)
Urinalysis negative.  Discharge as per Dr. Jeneen Rinks discharge instructions   Fredia Sorrow, MD 04/16/18 1626

## 2018-04-16 NOTE — ED Notes (Signed)
ED Provider at bedside. 

## 2018-04-16 NOTE — ED Provider Notes (Signed)
[facility] EMERGENCY DEPARTMENT Provider Note   CSN: [id number] Arrival date & time: 04/16/18  1330     History   Chief Complaint Chief Complaint  Patient presents with  . Weakness    HPI Alex Dunn is a 82 y.o. male here for progressive weakness and fatigue. This all started around March 2019, he has been following closely with his PCP for this. So far there is nothing abnormal in his extensive blood work and scans.  He was seen yesterday by his PCP for the symptoms as well.  He had a blood work done which was unremarkable for any pathology.  He was referred to infectious disease for possible viral infection that was undetectable in his other blood work.  Him and his wife not that they came in today because his weakness is getting very bad to the point where he is having trouble walking. While walking he feels like he may fall secondary to weakness. He denies any falls though. Admits to body aches, decreased appetite, and weight loss. He denies any fevers, chills, nausea, vomiting, diarrhea, constipation, or abdominal pain.  Denies any stress, depression, or anxiety. He is taking lexapro.   HPI  Past Medical History:  Diagnosis Date  . ANEMIA-IRON DEFICIENCY 06/23/2007  . BENIGN PROSTATIC HYPERTROPHY 06/23/2007  . GERD 06/23/2007  . Hypertension     Patient Active Problem List   Diagnosis Date Noted  . Essential hypertension 04/15/2018  . Diverticulosis 04/15/2018  . Abdominal aortic atherosclerosis ([place]) 04/15/2018  . Elevated hemidiaphragm 04/15/2018  . Polycystic kidney disease 04/06/2018  . Night sweats 03/13/2018  . Fatigue 03/13/2018  . Chronic kidney disease 10/18/2016  . ANEMIA-IRON DEFICIENCY 06/23/2007  . GERD 06/23/2007  . BPH (benign prostatic hyperplasia) 06/23/2007    Past Surgical History:  Procedure Laterality Date  . HAND SURGERY        Home Medications    Prior to Admission medications   Medication Sig Start Date End Date Taking?  Authorizing Provider  escitalopram (LEXAPRO) 5 MG tablet Take 1 tablet (5 mg total) by mouth daily. 04/06/18  Yes Marletta Lor, MD  aspirin EC 81 MG tablet Take 1 tablet (81 mg total) by mouth daily. 10/18/16   Marletta Lor, MD  ergocalciferol (VITAMIN D2) 50000 units capsule Take 1 capsule (50,000 Units total) by mouth once a week. 03/09/18   Marletta Lor, MD  pantoprazole (PROTONIX) 40 MG tablet Take 1 tablet (40 mg total) by mouth daily as needed. 10/16/15   Marletta Lor, MD    Family History Family History  Problem Relation Age of Onset  . Stroke Brother   . Cancer Brother        prostate ca    Social History Social History   Tobacco Use  . Smoking status: Never Smoker  . Smokeless tobacco: Never Used  Substance Use Topics  . Alcohol use: No  . Drug use: No    Allergies   Codeine phosphate   Review of Systems Review of Systems: see HPI. Otherwise negative   Physical Exam Updated Vital Signs BP (!) 153/77 (BP Location: Left Arm)   Pulse 84   Temp 98.6 F (37 C) (Oral)   Resp 20   Ht 6' (1.829 m)   Wt 83.5 kg (184 lb)   SpO2 98%   BMI 24.95 kg/m   Physical Exam  Constitutional: He is oriented to person, place, and time. He appears well-developed and well-nourished. No distress.  HENT:  Head: Normocephalic and atraumatic.  Right Ear: External ear normal.  Left Ear: External ear normal.  Nose: Nose normal.  Mouth/Throat: Oropharynx is clear and moist. No oropharyngeal exudate.  Eyes: Pupils are equal, round, and reactive to light. Conjunctivae and EOM are normal.  Neck: Normal range of motion. Neck supple.  Cardiovascular: Normal rate, regular rhythm, normal heart sounds and intact distal pulses.  Pulmonary/Chest: Effort normal and breath sounds normal. No stridor. No respiratory distress. He has no wheezes.  Abdominal: Soft. Bowel sounds are normal. He exhibits no distension. There is no tenderness.  Musculoskeletal: Normal  range of motion.  Neurological: He is alert and oriented to person, place, and time. He has normal strength and normal reflexes. He is not disoriented. He displays tremor (mild hand tremor bilaterally). He displays no atrophy and normal reflexes. No cranial nerve deficit or sensory deficit. He exhibits normal muscle tone. He displays no seizure activity. Coordination normal.  Skin: Skin is warm. Capillary refill takes less than 2 seconds. No rash noted.  Psychiatric: His speech is normal. Thought content normal. His affect is blunt.    ED Treatments / Results  Labs (all labs ordered are listed, but only abnormal results are displayed) Labs Reviewed  URINE CULTURE  CULTURE, BLOOD (ROUTINE X 2)  CULTURE, BLOOD (ROUTINE X 2)  ANTINUCLEAR ANTIBODIES, IFA  EXTRACTABLE NUCLEAR ANTIGEN ANTIBODY  ANTI-JO 1 ANTIBODY, IGG  URINALYSIS, ROUTINE W REFLEX MICROSCOPIC  B. BURGDORFI ANTIBODIES    EKG None  Radiology No results found.  Procedures Procedures (including critical care time)  Medications Ordered in ED Medications - No data to display   Initial Impression / Assessment and Plan / ED Course  I have reviewed the triage vital signs and the nursing notes.  Pertinent labs & imaging results that were available during my care of the patient were reviewed by me and considered in my medical decision making (see chart for details).    82 yo M here for fatigue/generalized weakness/poor appetite.  Is been going on for at least 3 months and he has been following closely with his PCP.  As of now all blood work and advanced imaging has been unremarkable for any pathology to explain his symptoms.  PCP ordered CMP and CBC yesterday which was also unremarkable.  His thyroid studies have been normal.  HIV and RPR were both negative.  He has been started on Lexapro by his PCP last week and referred to ID.  Neurological exam largely unremarkable, he has normal strength and sensation.  There is a mild  hand tremor with outstretched hands but otherwise no deficits. Is able to ambulate unassisted.  Urinalysis and urine culture collected as well as blood culture x2.  Collected Lyme antibodies as well as ANA and other autoimmune antibodies.  Explained to patient and wife that his blood work will not come back to day and that this is something that needs to be followed up with them in the outpatient setting. UA pending at this time. Patient signed out to Dr. Jeneen Rinks.    Final Clinical Impressions(s) / ED Diagnoses   Final diagnoses:  Weakness  Generalized weakness    ED Discharge Orders    None       Carlyle Dolly, MD 04/16/18 1537    Tanna Furry, MD 04/25/18 2316

## 2018-04-16 NOTE — ED Provider Notes (Signed)
Since seen and evaluated.  Discussed at length with resident.  Extensive chart review from patient's PCP performed.  Abnormal sed rate, CRP, urine, blood counts, CPK, CT scan of chest abdomen pelvis, thyroid function testing.  Started on Lexapro 8 days ago.  Is compliant.  Only new symptom is fine tremor.  He is ambulatory.  He states he is in the ER today as "I am just weak".  He states "at times I think I might fall.  He is not falling.  He is not using anything to assist himself walking i.e. no walker or crutches etc.  We will repeat basic urine.  We will send rheumatologic panel and Lyme titers.  We will not initiate any therapy as his diagnosis is still somewhat evasive.  His primary care physician mentioned infectious disease referral in his note, I have given the patient the number for [facility] center for infectious disease to call to check on appointments.  I recommended that he go to physical therapy for conditioning and strengthening.  He states "I do not think I need that yet".  However I did give this to him in written form with the phone number to call should he decide to follow-up.  No indication for any additional imaging or testing at this time.  No indications for inpatient treatment   Tanna Furry, MD 04/16/18 1527

## 2018-04-16 NOTE — Discharge Instructions (Signed)
Physical therapy:  [phone] I sthe number for Out patient Physical Therapy IN THIS BUILDING. Call for out patient appointment.  To help with strength, conditioning, and coordination.  Infectious disease referral: This was referenced by Dr. Raliegh Ip at your last office visit.  Call the office to schedule appointment time.  Multiple additional blood tests were performed today in the emergency room which will not be available for approximately 1 week.  These should be available for your next visit with Dr. Raliegh Ip, or the infectious disease specialist

## 2018-04-17 LAB — EXTRACTABLE NUCLEAR ANTIGEN ANTIBODY
SSB (La) (ENA) Antibody, IgG: <0.2 AI (ref 0.0–0.9)
ds DNA Ab: 32 IU/mL — ABNORMAL HIGH (ref 0–9)

## 2018-04-17 LAB — URINE CULTURE: Culture: <10000 — AB

## 2018-04-17 LAB — B. BURGDORFI ANTIBODIES: B burgdorferi Ab IgG+IgM: <0.91 {ISR} (ref 0.00–0.90)

## 2018-04-17 LAB — ANTI-JO 1 ANTIBODY, IGG: Anti JO-1: <0.2 AI (ref 0.0–0.9)

## 2018-04-18 LAB — FANA STAINING PATTERNS: Homogeneous Pattern: 1:80 {titer}

## 2018-04-18 LAB — ANTINUCLEAR ANTIBODIES, IFA: ANTINUCLEAR ANTIBODIES, IFA: POSITIVE — AB

## 2018-04-21 LAB — CULTURE, BLOOD (ROUTINE X 2)
CULTURE: NO GROWTH
Culture: NO GROWTH
SPECIAL REQUESTS: ADEQUATE
Special Requests: ADEQUATE

## 2018-04-22 ENCOUNTER — Other Ambulatory Visit: Payer: Self-pay | Admitting: Internal Medicine

## 2018-04-22 ENCOUNTER — Other Ambulatory Visit (INDEPENDENT_AMBULATORY_CARE_PROVIDER_SITE_OTHER): Payer: Medicare Other

## 2018-04-22 DIAGNOSIS — Z1211 Encounter for screening for malignant neoplasm of colon: Secondary | ICD-10-CM | POA: Diagnosis not present

## 2018-04-22 LAB — POC HEMOCCULT BLD/STL (HOME/3-CARD/SCREEN): Fecal Occult Blood, POC: NEGATIVE

## 2018-04-23 ENCOUNTER — Ambulatory Visit: Payer: Medicare Other | Admitting: Cardiology

## 2018-04-23 ENCOUNTER — Encounter: Payer: Self-pay | Admitting: Cardiology

## 2018-04-23 VITALS — BP 120/70 | HR 69 | Ht 72.0 in | Wt 186.8 lb

## 2018-04-23 DIAGNOSIS — I700 Atherosclerosis of aorta: Secondary | ICD-10-CM

## 2018-04-23 DIAGNOSIS — R61 Generalized hyperhidrosis: Secondary | ICD-10-CM | POA: Diagnosis not present

## 2018-04-23 DIAGNOSIS — R5383 Other fatigue: Secondary | ICD-10-CM

## 2018-04-23 DIAGNOSIS — I10 Essential (primary) hypertension: Secondary | ICD-10-CM | POA: Diagnosis not present

## 2018-04-23 NOTE — Progress Notes (Signed)
Cardiology Office Note:    Date:  04/23/2018   ID:  Alex Dunn, DOB [birth year], MRN [id number]  PCP:  Marletta Lor, MD  Cardiologist:  Jenne Campus, MD    Referring MD: Marletta Lor, MD   Chief Complaint  Patient presents with  . Follow up Echo  Still feeling weak and tired  History of Present Illness:    Alex Dunn is a 82 y.o. male with profound weakness and fatigue.  He was sent to [place] for evaluation of his heart.  He did have a stress test which was negative.  Also echocardiogram has been done which showed some mitral valve prolapse with mild mitral regurgitation.  He also wear event recorder which shows some nonsustained ventricular tachycardia.  He does not have any passing out on no dizziness except weakness and fatigue which can last for hours.  I do not think this related to the arrhythmia.  He did have a lot of tests done that include some rheumatological test the only thing abnormal that being identified so far is elevated ANA as well as low vitamin D3 that being already treated appropriately.  On top of that he was find to have large kidney cyst he is scheduled to see nephrologist for it.  Overall he said he got good days and bad days he was given Lexapro for potential depression which was very appropriate move however developed side effects of shakiness with it and that medication has been withdrawn.  Past Medical History:  Diagnosis Date  . ANEMIA-IRON DEFICIENCY 06/23/2007  . BENIGN PROSTATIC HYPERTROPHY 06/23/2007  . GERD 06/23/2007  . Hypertension     Past Surgical History:  Procedure Laterality Date  . HAND SURGERY      Current Medications: Current Meds  Medication Sig  . aspirin EC 81 MG tablet Take 1 tablet (81 mg total) by mouth daily.  . ergocalciferol (VITAMIN D2) 50000 units capsule Take 1 capsule (50,000 Units total) by mouth once a week.  . pantoprazole (PROTONIX) 40 MG tablet Take 1 tablet (40 mg total) by mouth daily as  needed.     Allergies:   Codeine phosphate   Social History   Socioeconomic History  . Marital status: Married    Spouse name: Not on file  . Number of children: Not on file  . Years of education: Not on file  . Highest education level: Not on file  Occupational History  . Not on file  Social Needs  . Financial resource strain: Not on file  . Food insecurity:    Worry: Not on file    Inability: Not on file  . Transportation needs:    Medical: Not on file    Non-medical: Not on file  Tobacco Use  . Smoking status: Never Smoker  . Smokeless tobacco: Never Used  Substance and Sexual Activity  . Alcohol use: No  . Drug use: No  . Sexual activity: Not on file  Lifestyle  . Physical activity:    Days per week: Not on file    Minutes per session: Not on file  . Stress: Not on file  Relationships  . Social connections:    Talks on phone: Not on file    Gets together: Not on file    Attends religious service: Not on file    Active member of club or organization: Not on file    Attends meetings of clubs or organizations: Not on file    Relationship status: Not  on file  Other Topics Concern  . Not on file  Social History Narrative  . Not on file     Family History: The patient's family history includes Cancer in his brother; Stroke in his brother. ROS:   Please see the history of present illness.    All 14 point review of systems negative except as described per history of present illness  EKGs/Labs/Other Studies Reviewed:      Recent Labs: 01/28/2018: TSH 2.78 04/15/2018: ALT 9; BUN 19; Creatinine, Ser 1.36; Hemoglobin 14.9; Platelets 208.0; Potassium 4.4; Sodium 140  Recent Lipid Panel    Component Value Date/Time   CHOL 159 10/21/2017 0907   TRIG 79.0 10/21/2017 0907   HDL 40.30 10/21/2017 0907   CHOLHDL 4 10/21/2017 0907   VLDL 15.8 10/21/2017 0907   LDLCALC 103 (H) 10/21/2017 0907    Physical Exam:    VS:  BP 120/70   Pulse 69   Ht 6' (1.829 m)    Wt 186 lb 12.8 oz (84.7 kg)   SpO2 97%   BMI 25.33 kg/m     Wt Readings from Last 3 Encounters:  04/23/18 186 lb 12.8 oz (84.7 kg)  04/16/18 184 lb (83.5 kg)  04/15/18 184 lb (83.5 kg)     GEN:  Well nourished, well developed in no acute distress HEENT: Normal NECK: No JVD; No carotid bruits LYMPHATICS: No lymphadenopathy CARDIAC: RRR, no murmurs, no rubs, no gallops RESPIRATORY:  Clear to auscultation without rales, wheezing or rhonchi  ABDOMEN: Soft, non-tender, non-distended MUSCULOSKELETAL:  No edema; No deformity  SKIN: Warm and dry LOWER EXTREMITIES: no swelling NEUROLOGIC:  Alert and oriented x 3 PSYCHIATRIC:  Normal affect   ASSESSMENT:    1. Abdominal aortic atherosclerosis ([place])   2. Essential hypertension   3. Night sweats   4. Fatigue, unspecified type    PLAN:    In order of problems listed above:  1. Abdominal aortic atherosclerosis.  Does not required any intervention right now we will continue monitoring 2. Essential hypertension: Blood pressure well controlled continue present management 3. Night sweats.  Quite extensive infectious disease work-up has been done, so far no explanation for his symptoms he is scheduled to see infectious disease specialist which I think is a good idea 4. Fatigue: As above.  So far I am not able to identify any significant heart problem that would explain his symptomatology stress test showed no evidence of ischemia echocardiogram preserved ejection fraction.  He does have some nonsustained ventricular tachycardia however with normal stress test and echocardiogram overall is a low risk findings.  It is not responsible for his symptoms.  We will continue monitoring it.  Him back in 3 months or sooner if he has a problem   Medication Adjustments/Labs and Tests Ordered: Current medicines are reviewed at length with the patient today.  Concerns regarding medicines are outlined above.  No orders of the defined types were placed  in this encounter.  Medication changes: No orders of the defined types were placed in this encounter.   Signed, Park Liter, MD, [facility] 04/23/2018 3:10 PM    [place]

## 2018-04-23 NOTE — Patient Instructions (Signed)
Medication Instructions:  Your physician recommends that you continue on your current medications as directed. Please refer to the Current Medication list given to you today.  Labwork: None ordered  Testing/Procedures: None ordered  Follow-Up: Your physician recommends that you schedule a follow-up appointment in: 3 month follow up with Dr. Agustin Cree   Any Other Special Instructions Will Be Listed Below (If Applicable).     If you need a refill on your cardiac medications before your next appointment, please call your pharmacy.

## 2018-04-24 ENCOUNTER — Ambulatory Visit: Payer: Medicare Other | Admitting: Cardiology

## 2018-04-25 LAB — COLOGUARD: COLOGUARD: NEGATIVE

## 2018-04-29 NOTE — Progress Notes (Signed)
Patient: Alex Dunn  DOB: Jun 19, 1936 MRN: [id number] PCP: Marletta Lor, MD  Referring Provider: Marletta Lor, MD   Patient Active Problem List   Diagnosis Date Noted  . Essential hypertension 04/15/2018  . Diverticulosis 04/15/2018  . Abdominal aortic atherosclerosis ([place]) 04/15/2018  . Elevated hemidiaphragm 04/15/2018  . Polycystic kidney disease 04/06/2018  . Night sweats 03/13/2018  . Fatigue 03/13/2018  . Chronic kidney disease 10/18/2016  . ANEMIA-IRON DEFICIENCY 06/23/2007  . GERD 06/23/2007  . BPH (benign prostatic hyperplasia) 06/23/2007     Subjective:  Alex Dunn is an 82 y.o. Caucasian male with a 3 month history of extreme fatigue and profound weakness referred to Infectious Diseases to help identify a cause to his symptoms.   Alex Dunn is here today with his wife, Alex Dunn. They both facilitate history today. Onset March 2019 where he presented to his PCP with night sweats. He recalls a "drenching sweat" one night in a hotel here in [place] as he was away for work and felt as if he "had the flu" that lasted 4-5 hours and then it went away. These episodes became more and more frequent for him and progressed to "extreme exhaustion/fatigue and myalgias". Night sweats he feels have resolved over the last 1 month. Weight loss (198lb >> 182 lb) however has regained a few pounds back recently. Initial labs noted that his Vitamin D was low, however this is now being appropriately supplemented. CT c/a/p obtained and unremarkable for adenopathy, malignant or infectious process; severe diverticulosis was noted w/o active inflammation. Large but benign appearing cyst in R kidney. Cardiology referral was made - TTE obtained and revealed normal EF, mitral valve prolapse with mild regurgitation; mild-mod tricuspid regurgitation with normal PA pressures. Stress ECG/Echo w/o evidence of ischemia; frequent PVCs and occasional couplets at peak but resolved quickly. Fourteen day  event monitor did not reveal any brady-/tachyarrhythmias. Hep C, HIV, RPR negative. Several sets of blood cultures have all been negative. FOBT negative x 2. Serial ESRs have all been negative. PPD 03-2018 neg (no risk factors for TB - denies any travel to endemic area, homelessness, incarceration). He was started empirically on escitalopram for suspected depression contributor, however it sounds like he had tremors with medication and he stopped it after a few doses. Seems he has ongoing tremors despite stopping medication.   Last Thursday he had an episode that prompted an ED visit. Autoimmune labs were drawn and revealed: ANA IFA positive (1:80, homogeneous pattern), dsDNA Ab elevated @ 32. These episodes are described to where he feels very shaky in upper extremities, weak as water, "bad case of the flu." Associated symptoms can include mild blurry vision and headache. He notes that he often feels very "worn out" after a bowel movement. No change to his color, quantity or consistency to his stool. Colonoscopy 10-12 years ago was normal. He has had several negative stool cards recently. He also says he is now experiencing worsening right upper back pain - only hurts when he is trying to get out of bed. He has had some pain in the hands lately; noticed yesterday evening when he was moving chairs described to be sharp and dull aching pains.   Has had no travel history in the last 18 months outside of [place]. He has never traveled outside the [place]. He currently works part time in [employer] where he travels for work. No exposure to chemicals.  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 Spends  most time indoors but he does like to go outdoors for yard work/fun prior to feeling ill and tells me he picked several ticks off of him (although this was after onset of symptoms) and says he is frequently bitten by mosquitos here in [place]. No history of interaction with life stock or farming work. No  unpasteurized milk/cheese ingestion history. No drug use, alcohol use or smoking. Brother has heart problems and died suddenly after a "triple valve replacement last year."   Review of Systems  All other systems reviewed and are negative.   Past Medical History:  Diagnosis Date  . ANEMIA-IRON DEFICIENCY 06/23/2007  . BENIGN PROSTATIC HYPERTROPHY 06/23/2007  . GERD 06/23/2007  . Hypertension     Outpatient Medications Prior to Visit  Medication Sig Dispense Refill  . ergocalciferol (VITAMIN D2) 50000 units capsule Take 1 capsule (50,000 Units total) by mouth once a week. 12 capsule 0  . pantoprazole (PROTONIX) 40 MG tablet Take 1 tablet (40 mg total) by mouth daily as needed. 90 tablet 3  . aspirin EC 81 MG tablet Take 1 tablet (81 mg total) by mouth daily. (Patient not taking: Reported on 04/30/2018)     No facility-administered medications prior to visit.      Allergies  Allergen Reactions  . Codeine Phosphate     Social History   Tobacco Use  . Smoking status: Never Smoker  . Smokeless tobacco: Never Used  Substance Use Topics  . Alcohol use: No  . Drug use: No    Family History  Problem Relation Age of Onset  . Stroke Brother   . Cancer Brother        prostate ca    Objective:   Vitals:   04/30/18 1340  BP: (!) 170/82  Pulse: 69  Temp: 98.1 F (36.7 C)  Weight: 187 lb (84.8 kg)  Height: 6' (1.829 m)   Body mass index is 25.36 kg/m.  Physical Exam  Constitutional: He is oriented to person, place, and time. He appears well-developed and well-nourished.  Seated comfortably in chair during visit. Appears fatigued. Wife present.   HENT:  Head: Normocephalic.  Mouth/Throat: Oropharynx is clear and moist and mucous membranes are normal. Normal dentition. No dental abscesses.  Eyes: Pupils are equal, round, and reactive to light. Conjunctivae are normal. No scleral icterus.  Neck: Normal range of motion. Neck supple. No JVD present. No thyromegaly present.    Cardiovascular: Normal rate and regular rhythm.  Murmur (soft systolic murmur with audible click) heard. Pulmonary/Chest: Effort normal and breath sounds normal. No respiratory distress. He has no wheezes. He has no rales.  Abdominal: Soft. Bowel sounds are normal. He exhibits no distension and no mass. There is no tenderness.  Musculoskeletal: Normal range of motion. He exhibits no edema.  Some swelling to hands. Fine tremor noted in upper extremities.   Lymphadenopathy:    He has cervical adenopathy.       Right cervical: Superficial cervical adenopathy present.  Neurological: He is alert and oriented to person, place, and time. No cranial nerve deficit or sensory deficit. Coordination normal.  Skin: Skin is warm and dry. Capillary refill takes less than 2 seconds. No rash noted. He is not diaphoretic. No erythema.  No rash visible on lower back  Psychiatric: He has a normal mood and affect. Judgment normal.  In good spirits today but frustrated with delay in diagnosis.     Lab Results: Lab Results  Component Value Date   WBC 7.6 04/15/2018  HGB 14.9 04/15/2018   HCT 44.3 04/15/2018   MCV 95.1 04/15/2018   PLT 208.0 04/15/2018    Lab Results  Component Value Date   CREATININE 1.36 04/15/2018   BUN 19 04/15/2018   NA 140 04/15/2018   K 4.4 04/15/2018   CL 104 04/15/2018   CO2 27 04/15/2018    Lab Results  Component Value Date   ALT 9 04/15/2018   AST 13 04/15/2018   ALKPHOS 79 04/15/2018   BILITOT 0.6 04/15/2018     Assessment & Plan:   Problem List Items Addressed This Visit      Genitourinary   Polycystic kidney disease - Primary    Nephrology evaluation pending.   Lab Results  Component Value Date   CREATININE 1.36 04/15/2018   CREATININE 1.46 (H) 03/06/2018   CREATININE 1.36 01/28/2018           Other   Night sweats    It appears as if these have resolved now but were part of the constellation of presenting symptoms. Could reflect infectious,  metabolic/endocrine, autoimmune process contributing.       Fatigue    Alex Dunn has had a fairly extensive work up regarding his concerning and sudden symptoms. He and his wife are frustrated and very concerned they will not identify what is going on to explain. We spent a lot of time reviewing all consults and labs today. He has an upcoming appointment with [facility] for evaluation considering his CKD and renal cyst.  We reviewed vector-transmitted illnesses considering his travel history. He has never had a rash following a tick bite and considering the onset of his symptoms was still during cold/winter months it is unlikely that vector is contributing here. With myalgias I would check hepatitis b serology for completeness. Will check serum cortisol to determine if any hypoadrenal process with enlarged cyst. One other thing to consider from infectious work up we could ask cardiology to consider would be utility of TEE to get a better look at mitral valve considering prolapse to feel better about ruling out any endocarditis component (night sweats, weight loss, fatigue in the setting of known valvular condition). No recent dental procedures and no dental complaints. Blood cultures have all been negative, however this does not rule out the possibility.   I am most interested in the autoimmune work up, in particular the elevated dsDNA Ab. I do not think it is out of the question this is a late-onset lupus. From reviewing the literature caucasian males are more likely to have late presentation and primary complaint being fatigue. I will check a Rheumatoid Factor and complement assay considering his hand pain/stiffness and chronic kidney disease. May be beneficial to have him seen by rheumatology if still no explanation.   We also discussed how his sleep is often disrupted and interrupted due to excessive nocturnal urination. He is hoping he can talk with the nephrology team to see if they can  make a recommendation for this. I am certain it is contributing to his fatigue but do not feel it is responsible for his weakness.   Will have him return early next week for labs and will discuss over the phone regarding next steps.         Thank you kindly for the referral and for the opportunity to meet Alex Dunn.   Janene Madeira, MSN, NP-C [facility] for Infect[facility] [place] Pager: [phone] Office: [phone]  05/01/18  4:10 PM

## 2018-04-30 ENCOUNTER — Other Ambulatory Visit: Payer: Self-pay

## 2018-04-30 ENCOUNTER — Encounter: Payer: Self-pay | Admitting: Infectious Diseases

## 2018-04-30 ENCOUNTER — Ambulatory Visit: Payer: Medicare Other | Admitting: Infectious Diseases

## 2018-04-30 VITALS — BP 170/82 | HR 69 | Temp 98.1°F | Ht 72.0 in | Wt 187.0 lb

## 2018-04-30 DIAGNOSIS — R61 Generalized hyperhidrosis: Secondary | ICD-10-CM | POA: Diagnosis not present

## 2018-04-30 DIAGNOSIS — M791 Myalgia, unspecified site: Secondary | ICD-10-CM | POA: Diagnosis not present

## 2018-04-30 DIAGNOSIS — R5383 Other fatigue: Secondary | ICD-10-CM

## 2018-04-30 DIAGNOSIS — Q613 Polycystic kidney, unspecified: Secondary | ICD-10-CM | POA: Diagnosis not present

## 2018-04-30 NOTE — Assessment & Plan Note (Addendum)
It appears as if these have resolved now but were part of the constellation of presenting symptoms. Could reflect infectious, metabolic/endocrine, autoimmune process contributing.

## 2018-04-30 NOTE — Patient Instructions (Addendum)
Wonderful to meet you both today.   Will take one more careful look through what has already been looked at and have you come back early next week for a lab test.   Anything new that comes up please feel free to send a MyChart.

## 2018-05-01 NOTE — Assessment & Plan Note (Signed)
Nephrology evaluation pending.   Lab Results  Component Value Date   CREATININE 1.36 04/15/2018   CREATININE 1.46 (H) 03/06/2018   CREATININE 1.36 01/28/2018

## 2018-05-01 NOTE — Assessment & Plan Note (Signed)
Alex Dunn has had a fairly extensive work up regarding his concerning and sudden symptoms. He and his wife are frustrated and very concerned they will not identify what is going on to explain. We spent a lot of time reviewing all consults and labs today. He has an upcoming appointment with [facility] for evaluation considering his CKD and renal cyst.  We reviewed vector-transmitted illnesses considering his travel history. He has never had a rash following a tick bite and considering the onset of his symptoms was still during cold/winter months it is unlikely that vector is contributing here. With myalgias I would check hepatitis b serology for completeness. Will check serum cortisol to determine if any hypoadrenal process with enlarged cyst. One other thing to consider from infectious work up we could ask cardiology to consider would be utility of TEE to get a better look at mitral valve considering prolapse to feel better about ruling out any endocarditis component (night sweats, weight loss, fatigue in the setting of known valvular condition). No recent dental procedures and no dental complaints. Blood cultures have all been negative, however this does not rule out the possibility.   I am most interested in the autoimmune work up, in particular the elevated dsDNA Ab. I do not think it is out of the question this is a late-onset lupus. From reviewing the literature caucasian males are more likely to have late presentation and primary complaint being fatigue. I will check a Rheumatoid Factor and complement assay considering his hand pain/stiffness and chronic kidney disease. May be beneficial to have him seen by rheumatology if still no explanation.   We also discussed how his sleep is often disrupted and interrupted due to excessive nocturnal urination. He is hoping he can talk with the nephrology team to see if they can make a recommendation for this. I am certain it is contributing to his  fatigue but do not feel it is responsible for his weakness.   Will have him return early next week for labs and will discuss over the phone regarding next steps.

## 2018-05-03 ENCOUNTER — Encounter (HOSPITAL_COMMUNITY): Payer: Self-pay

## 2018-05-03 ENCOUNTER — Emergency Department (HOSPITAL_COMMUNITY): Payer: Medicare Other

## 2018-05-03 ENCOUNTER — Other Ambulatory Visit: Payer: Self-pay

## 2018-05-03 ENCOUNTER — Emergency Department (HOSPITAL_COMMUNITY)
Admission: EM | Admit: 2018-05-03 | Discharge: 2018-05-03 | Disposition: A | Discharge status: Home / Self Care | Payer: Medicare Other | Attending: Emergency Medicine | Admitting: Emergency Medicine

## 2018-05-03 DIAGNOSIS — N189 Chronic kidney disease, unspecified: Secondary | ICD-10-CM | POA: Diagnosis not present

## 2018-05-03 DIAGNOSIS — I129 Hypertensive chronic kidney disease with stage 1 through stage 4 chronic kidney disease, or unspecified chronic kidney disease: Secondary | ICD-10-CM | POA: Diagnosis not present

## 2018-05-03 DIAGNOSIS — R1031 Right lower quadrant pain: Secondary | ICD-10-CM | POA: Insufficient documentation

## 2018-05-03 DIAGNOSIS — Z79899 Other long term (current) drug therapy: Secondary | ICD-10-CM | POA: Insufficient documentation

## 2018-05-03 DIAGNOSIS — R109 Unspecified abdominal pain: Secondary | ICD-10-CM | POA: Diagnosis not present

## 2018-05-03 DIAGNOSIS — R0602 Shortness of breath: Secondary | ICD-10-CM | POA: Diagnosis not present

## 2018-05-03 DIAGNOSIS — N281 Cyst of kidney, acquired: Secondary | ICD-10-CM | POA: Diagnosis not present

## 2018-05-03 LAB — CBC WITH DIFFERENTIAL/PLATELET
BASOS ABS: 0 10*3/uL (ref 0.0–0.1)
Basophils Relative: 0 %
Eosinophils Absolute: 0.1 10*3/uL (ref 0.0–0.7)
Eosinophils Relative: 1 %
HEMATOCRIT: 42.1 % (ref 39.0–52.0)
Hemoglobin: 14 g/dL (ref 13.0–17.0)
Lymphocytes Relative: 23 %
Lymphs Abs: 1.4 10*3/uL (ref 0.7–4.0)
MCH: 31.7 pg (ref 26.0–34.0)
MCHC: 33.3 g/dL (ref 30.0–36.0)
MCV: 95.2 fL (ref 78.0–100.0)
MONO ABS: 0.5 10*3/uL (ref 0.1–1.0)
Monocytes Relative: 9 %
NEUTROS ABS: 4.1 10*3/uL (ref 1.7–7.7)
NEUTROS PCT: 67 %
Platelets: 167 10*3/uL (ref 150–400)
RBC: 4.42 MIL/uL (ref 4.22–5.81)
RDW: 13.3 % (ref 11.5–15.5)
WBC: 6.1 10*3/uL (ref 4.0–10.5)

## 2018-05-03 LAB — URINALYSIS, ROUTINE W REFLEX MICROSCOPIC
Bilirubin Urine: NEGATIVE
GLUCOSE, UA: NEGATIVE mg/dL
Hgb urine dipstick: NEGATIVE
KETONES UR: NEGATIVE mg/dL
LEUKOCYTES UA: NEGATIVE
NITRITE: NEGATIVE
Protein, ur: NEGATIVE mg/dL
Specific Gravity, Urine: 1.012 (ref 1.005–1.030)
pH: 7 (ref 5.0–8.0)

## 2018-05-03 LAB — COMPREHENSIVE METABOLIC PANEL
ALT: 14 U/L (ref 0–44)
AST: 16 U/L (ref 15–41)
Albumin: 3.3 g/dL — ABNORMAL LOW (ref 3.5–5.0)
Alkaline Phosphatase: 66 U/L (ref 38–126)
Anion gap: 6 (ref 5–15)
BUN: 21 mg/dL (ref 8–23)
CHLORIDE: 107 mmol/L (ref 98–111)
CO2: 29 mmol/L (ref 22–32)
Calcium: 9.1 mg/dL (ref 8.9–10.3)
Creatinine, Ser: 1.5 mg/dL — ABNORMAL HIGH (ref 0.61–1.24)
GFR calc non Af Amer: 43 mL/min — ABNORMAL LOW (ref >60)
GFR, EST AFRICAN AMERICAN: 49 mL/min — AB (ref >60)
Glucose, Bld: 103 mg/dL — ABNORMAL HIGH (ref 70–99)
Potassium: 4.4 mmol/L (ref 3.5–5.1)
SODIUM: 142 mmol/L (ref 135–145)
Total Bilirubin: 0.5 mg/dL (ref 0.3–1.2)
Total Protein: 6.2 g/dL — ABNORMAL LOW (ref 6.5–8.1)

## 2018-05-03 MED ORDER — DIAZEPAM 2 MG PO TABS: 5.0000 mg | ORAL_TABLET | Freq: Two times a day (BID) | ORAL | 0 refills | Status: DC | PRN

## 2018-05-03 MED ORDER — DIAZEPAM 2 MG PO TABS
2.0000 mg | ORAL_TABLET | Freq: Once | ORAL | Status: AC
  Administered 2018-05-03: 2 mg via ORAL
  Filled 2018-05-03: qty 1

## 2018-05-03 MED ORDER — IOPAMIDOL (ISOVUE-300) INJECTION 61%
100.0000 mL | Freq: Once | INTRAVENOUS | Status: AC | PRN
  Administered 2018-05-03: 80 mL via INTRAVENOUS

## 2018-05-03 MED ORDER — ACETAMINOPHEN 325 MG PO TABS
650.0000 mg | ORAL_TABLET | Freq: Once | ORAL | Status: AC
  Administered 2018-05-03: 650 mg via ORAL
  Filled 2018-05-03: qty 2

## 2018-05-03 MED ORDER — IOPAMIDOL (ISOVUE-300) INJECTION 61%
INTRAVENOUS | Status: AC
  Filled 2018-05-03: qty 100

## 2018-05-03 NOTE — ED Triage Notes (Signed)
Right flank pain for about a week supposed to see the urologist on 16th of July states has a cyst on right kidney.

## 2018-05-03 NOTE — ED Notes (Signed)
Pt returned from CT °

## 2018-05-03 NOTE — Discharge Instructions (Addendum)
Thank you for allowing me to provide your care today in the Emergency Department.  Please keep your follow-up appointment with infectious disease this week and with urology on July 16. They can discuss with you further the test that was positive: the double-stranded DNA antibody test.   In the meantime, you can apply a heat compress to areas that are sore.  Make sure not to apply this directly to the skin.  Take 650 mg of Tylenol every 6 hours as needed for pain.  Scheduling this medication every 6 hours may help to control your pain for longer.  You may take 1 tablet of diazepam 1-2 times daily as needed for muscle spasms.  Return to the emergency department if you develop a high fever, or rash over the area, blood in your urine, severe shortness of breath, vomiting, or other new, concerning symptoms.

## 2018-05-03 NOTE — ED Notes (Signed)
Patient transported to CT 

## 2018-05-03 NOTE — ED Provider Notes (Signed)
[facility] DEPT Provider Note   CSN: [id number] Arrival date & time: 05/03/18  0453     History   Chief Complaint Chief Complaint  Patient presents with  . Flank Pain    HPI Alex Dunn is a 82 y.o. male with a h/o of polycystic kidney disease, BPH, HTN, GERD, and anemia who presents to the Emergency Department who presents to the emergency department with a chief complaint of right flank pain.  The patient endorses waxing and waning right flank pain that began approximately 1 week ago with progressive worsening since onset.  Pain is worse with positional changes, particularly when the patient goes from lying down to sitting upright.  He reports associated chills and intermittent dyspnea that is worse when the pain worsens.  He was seen by infectious disease on June 28, cardiology on June 20, and his PCP earlier this month.  The patient and his wife report that the patient has been having profound weakness and fatigue since March.  He was previously having night sweats that have since resolved.  He had an approximate 20 pound weight loss since March, but has since gained back 4 to 5 pounds.   He is scheduled to follow back up with infectious disease and 3 days and has an appointment with urology to work-up a 7.5 cm cyst on the right kidney on July 16.  Previous labs are notable for a positive ANA and ds DNA Ab.   He denies fever, night sweats, chest pain, nausea, vomiting, diarrhea, constipation, dizziness, lightheadedness, headache, syncope, dysuria, hematuria, frequency, rash, or numbness.  No treatment prior to arrival for symptoms as his wife says that he has been cautious about taking any medicine.  She reports that he is generally healthy, but has had a significant decline in functional status over the last few months.  The history is provided by the patient. No language interpreter was used.    Past Medical History:  Diagnosis Date  .  ANEMIA-IRON DEFICIENCY 06/23/2007  . BENIGN PROSTATIC HYPERTROPHY 06/23/2007  . GERD 06/23/2007  . Hypertension     Patient Active Problem List   Diagnosis Date Noted  . Essential hypertension 04/15/2018  . Diverticulosis 04/15/2018  . Abdominal aortic atherosclerosis ([place]) 04/15/2018  . Elevated hemidiaphragm 04/15/2018  . Polycystic kidney disease 04/06/2018  . Night sweats 03/13/2018  . Fatigue 03/13/2018  . Chronic kidney disease 10/18/2016  . ANEMIA-IRON DEFICIENCY 06/23/2007  . GERD 06/23/2007  . BPH (benign prostatic hyperplasia) 06/23/2007    Past Surgical History:  Procedure Laterality Date  . HAND SURGERY          Home Medications    Prior to Admission medications   Medication Sig Start Date End Date Taking? Authorizing Provider  Acetaminophen (TYLENOL) 325 MG CAPS Take 650 mg by mouth every 6 (six) hours as needed (fever).   Yes [provider]  ergocalciferol (VITAMIN D2) 50000 units capsule Take 1 capsule (50,000 Units total) by mouth once a week. 03/09/18  Yes Marletta Lor, MD  pantoprazole (PROTONIX) 40 MG tablet Take 1 tablet (40 mg total) by mouth daily as needed. Patient taking differently: Take 40 mg by mouth daily as needed (heartburn).  10/16/15  Yes Marletta Lor, MD  diazepam (VALIUM) 2 MG tablet Take 2.5 tablets (5 mg total) by mouth 2 (two) times daily as needed for muscle spasms. 05/03/18   Valdez Brannan, Laymond Purser, PA-C    Family History Family History  Problem Relation Age  of Onset  . Stroke Brother   . Cancer Brother        prostate ca    Social History Social History   Tobacco Use  . Smoking status: Never Smoker  . Smokeless tobacco: Never Used  Substance Use Topics  . Alcohol use: No  . Drug use: No     Allergies   Codeine phosphate and Lexapro [escitalopram oxalate]   Review of Systems Review of Systems  Constitutional: Positive for chills. Negative for appetite change and fever.  Respiratory: Positive for  shortness of breath.   Cardiovascular: Negative for chest pain.  Gastrointestinal: Negative for abdominal pain.  Genitourinary: Positive for flank pain. Negative for dysuria.  Musculoskeletal: Negative for back pain.  Skin: Negative for rash.  Allergic/Immunologic: Negative for immunocompromised state.  Neurological: Negative for headaches.  Psychiatric/Behavioral: Negative for confusion.   Physical Exam Updated Vital Signs BP 134/79 (BP Location: Left Arm)   Pulse 98   Temp 98.1 F (36.7 C) (Oral)   Resp 17   Ht 6' (1.829 m)   Wt 84.8 kg (187 lb)   SpO2 95%   BMI 25.36 kg/m   Physical Exam  Constitutional: He appears well-developed.  HENT:  Head: Normocephalic.  Eyes: Conjunctivae are normal.  Neck: Neck supple.  Cardiovascular: Normal rate, regular rhythm, normal heart sounds and intact distal pulses. Exam reveals no gallop and no friction rub.  No murmur heard. Pulmonary/Chest: Effort normal and breath sounds normal. No stridor. No respiratory distress. He has no wheezes. He has no rales. He exhibits no tenderness.  Abdominal: Soft. Bowel sounds are normal. He exhibits no distension and no mass. There is tenderness. There is no rebound and no guarding. No hernia.  Right CVA tenderness and right lower quadrant tenderness. No rebound or guarding. Negative Murphy's sign. No tenderness over McBurney's point. Abdomen is soft, non-distended.   Musculoskeletal: He exhibits tenderness. He exhibits no edema or deformity.  No tenderness to the cervical, thoracic, or lumbar spinous processes.  Paraspinal muscles of the cervical and thoracic spine are nontender.  No left-sided cervical paraspinal muscle tenderness.  Reproducible tenderness to the right paraspinal muscles. Patient does grimace with manipulation of the iliopsoas muscle, but denies pain.   Neurological: He is alert.  Skin: Skin is warm and dry.  Psychiatric: His behavior is normal.  Nursing note and vitals reviewed.  ED  Treatments / Results  Labs (all labs ordered are listed, but only abnormal results are displayed) Labs Reviewed  COMPREHENSIVE METABOLIC PANEL - Abnormal; Notable for the following components:      Result Value   Glucose, Bld 103 (*)    Creatinine, Ser 1.50 (*)    Total Protein 6.2 (*)    Albumin 3.3 (*)    GFR calc non Af Amer 43 (*)    GFR calc Af Amer 49 (*)    All other components within normal limits  URINALYSIS, ROUTINE W REFLEX MICROSCOPIC  CBC WITH DIFFERENTIAL/PLATELET    EKG None  Radiology Ct Abdomen Pelvis W Contrast  Result Date: 05/03/2018 CLINICAL DATA:  Patient with right flank pain. Known right renal cyst. EXAM: CT ABDOMEN AND PELVIS WITH CONTRAST TECHNIQUE: Multidetector CT imaging of the abdomen and pelvis was performed using the standard protocol following bolus administration of intravenous contrast. CONTRAST:  19mL ISOVUE-300 IOPAMIDOL (ISOVUE-300) INJECTION 61% COMPARISON:  CT abdomen pelvis 03/19/2018 FINDINGS: Lower chest: Normal heart size. Dependent atelectasis right lower lobe. No pleural effusion. Hepatobiliary: Liver is normal in size and  contour. No focal lesion is identified. Gallbladder is unremarkable. No intrahepatic or extrahepatic biliary ductal dilatation. Pancreas: Unremarkable Spleen: Unremarkable Adrenals/Urinary Tract: Adrenal glands are normal. Kidneys enhance symmetrically with contrast. Bilateral renal cysts measuring up to 7 cm within the superior pole of the right kidney. Additional smaller cysts are demonstrated within the right and left kidneys. Additionally, there are too small to characterize subcentimeter low-attenuation lesions. Urinary bladder is unremarkable. Stomach/Bowel: Sigmoid colonic diverticulosis. No CT evidence for acute diverticulitis. Normal appendix. No evidence for small bowel obstruction. No free fluid or free intraperitoneal air. Descending duodenum diverticulum. Vascular/Lymphatic: Normal caliber abdominal aorta. Peripheral  calcified atherosclerotic plaque. No retroperitoneal lymphadenopathy. Reproductive: Prostate unremarkable. Other: Small bilateral fat containing inguinal hernias, left-greater-than-right. Musculoskeletal: Lumbar spine degenerative changes. No aggressive or acute appearing osseous lesions. IMPRESSION: 1. No acute process within the abdomen or pelvis. 2. Heterogeneous opacities right lower lobe favored to represent atelectasis. Electronically Signed   By: Lovey Newcomer M.D.   On: 05/03/2018 12:21    Procedures Procedures (including critical care time)  Medications Ordered in ED Medications  acetaminophen (TYLENOL) tablet 650 mg (650 mg Oral Given 05/03/18 0723)  diazepam (VALIUM) tablet 2 mg (2 mg Oral Given 05/03/18 0723)  iopamidol (ISOVUE-300) 61 % injection 100 mL (80 mLs Intravenous Contrast Given 05/03/18 1130)     Initial Impression / Assessment and Plan / ED Course  I have reviewed the triage vital signs and the nursing notes.  Pertinent labs & imaging results that were available during my care of the patient were reviewed by me and considered in my medical decision making (see chart for details).  82 year old male with a h/o of polycystic kidney disease, BPH, HTN, GERD, and anemia who presents with progressively worsening right flank pain for the last week chills.  The patient was seen and evaluated along with Dr. Leonette Monarch, attending physician.  The patient had an extensive work-up over the last few months for the symptoms along with night sweats and weight loss.  He has seen his PCP, cardiology, and infectious disease within the last month.  Differential diagnosis includes infected renal cyst, deep muscle spasm, pyelonephritis, UTI, or pathology to the spine.  Valium and Tylenol given for pain control.  Will order basic labs and plan for MRI of the abdomen and pelvis with contrast.  The patient did have a noncontrasted CT in May which demonstrated a 7.5 renal cyst he is scheduled for  initial  evaluation with urology on July 16.   Clinical Course as of May 03 1928  Sun May 03, 2018  1051 Spoke with Jinny Blossom, MRI tech, who states that radiology had recommended CT abdomen pelvis with contrast since the patient had only previously had a noncontrasted study.  Spoke with Dr. Rosana Hoes, radiology, reviewed the patient's medical record and recommended a contrasted CT abdomen pelvis for initial evaluation.   [MM]    Clinical Course User Index [MM] Quartez Lagos A, PA-C   CT abdomen pelvis with contrast is negative for acute findings.  I have discussed these findings as well as the lab work with the patient and his wife.  Patient does report some improvement from Tylenol and Valium.  He also clarifies that chills have been ongoing since March and flank pain is only worsened over the last week.  Given  that positional changes do cause exacerbations of his pain I have a strong suspicion for musculoskeletal injury spasms at this time.  He has good follow-up outpatient as he will be seeing  infectious disease in 3 days and urology and approximately 2 weeks.  We will discharge the patient to home with Valium and Tylenol for pain control.  I have recommended that they avoid NSAIDs as the patient's creatinine was 1.5 today.  Strict return precautions given.  He is hemodynamically stable and in no acute distress.  The patient is safe for outpatient follow-up and discharge home at this time.   Final Clinical Impressions(s) / ED Diagnoses   Final diagnoses:  Right flank pain    ED Discharge Orders        Ordered    diazepam (VALIUM) 2 MG tablet  2 times daily PRN,   Status:  Discontinued     05/03/18 1253    diazepam (VALIUM) 2 MG tablet  2 times daily PRN     05/03/18 1254       Joline Maxcy A, PA-C 05/03/18 1929    Fatima Blank, MD 05/03/18 2012

## 2018-05-06 ENCOUNTER — Encounter (INDEPENDENT_AMBULATORY_CARE_PROVIDER_SITE_OTHER): Payer: Self-pay

## 2018-05-06 ENCOUNTER — Other Ambulatory Visit: Payer: Medicare Other

## 2018-05-06 DIAGNOSIS — M791 Myalgia, unspecified site: Secondary | ICD-10-CM

## 2018-05-06 DIAGNOSIS — R5383 Other fatigue: Secondary | ICD-10-CM | POA: Diagnosis not present

## 2018-05-08 LAB — COMPLEMENT, TOTAL: Compl, Total (CH50): >60 U/mL — ABNORMAL HIGH (ref 31–60)

## 2018-05-08 LAB — ACTH: C206 ACTH: 13 pg/mL (ref 6–50)

## 2018-05-09 LAB — HEMOGLOBIN A1C
Hgb A1c MFr Bld: 5.6 % of total Hgb (ref <5.7)
MEAN PLASMA GLUCOSE: 114 (calc)
eAG (mmol/L): 6.3 (calc)

## 2018-05-09 LAB — HEPATITIS B SURFACE ANTIGEN: Hepatitis B Surface Ag: NONREACTIVE

## 2018-05-09 LAB — EPSTEIN-BARR VIRUS VCA ANTIBODY PANEL
EBV NA IGG: 275 U/mL — AB
EBV VCA IGG: 119 U/mL — AB
EBV VCA IgM: <36 U/mL

## 2018-05-09 LAB — CORTISOL-AM, BLOOD: CORTISOL - AM: 8.7 ug/dL

## 2018-05-09 LAB — RHEUMATOID FACTOR: Rhuematoid fact SerPl-aCnc: <14 IU/mL (ref <14)

## 2018-05-11 ENCOUNTER — Telehealth: Payer: Self-pay | Admitting: Infectious Diseases

## 2018-05-11 ENCOUNTER — Encounter: Payer: Self-pay | Admitting: Infectious Diseases

## 2018-05-11 NOTE — Progress Notes (Signed)
Will have Ron back for another office visit to review after he is seen by nephrology team. Would like to repeat inflammatory markers in consideration of infected renal cyst playing a role here considering the ongoing/worsenened right flank pain, fatigue and +/- night sweats. Alternatively still interested pursuing further rheumatologic evaluation considering elevated dsDNA Ab.

## 2018-05-11 NOTE — Telephone Encounter (Signed)
Called on home and cell to get in touch with Ron or Dot to go over lab results and find out when his Nephrology appointment is. Would like to see him back after nephrology in the office for a visit to review again.   LVM requesting call back to get in touch with me.   Janene Madeira, MSN, NP-C [facility] 05/11/2018  4:09 PM

## 2018-05-12 DIAGNOSIS — I129 Hypertensive chronic kidney disease with stage 1 through stage 4 chronic kidney disease, or unspecified chronic kidney disease: Secondary | ICD-10-CM | POA: Diagnosis not present

## 2018-05-12 DIAGNOSIS — N183 Chronic kidney disease, stage 3 (moderate): Secondary | ICD-10-CM | POA: Diagnosis not present

## 2018-05-12 DIAGNOSIS — Q613 Polycystic kidney, unspecified: Secondary | ICD-10-CM | POA: Diagnosis not present

## 2018-05-12 NOTE — Telephone Encounter (Signed)
Patients wife states appointment with [facility] was today and went well.  Two  benign cyst were found on his kidneys but the suggestion was to watch for changes and call if he becomes symptomatic for UTI.   Wife is concerned about [facility] Patient results and would like to know if this is a new or past infection. She would like Stephanie to call if possible since she has problems with my chart.     Laverle Patter, RN

## 2018-05-18 NOTE — Telephone Encounter (Signed)
Called Alex Dunn to see how he is feeling. He went to his nephrology appointment and they too were concerned about the possibility of lupus contributing to his symptoms. He has "had a few good weeks" recently but the last 2 mornings he has been very fatigued and unable to function much. We discussed his lab results and plan to have him see his PCP in the morning to discuss rheumatology referral. I am not certain if these positive rheum labs are a distractor as to what is going on but his symptoms are certainly suggestive of this. I reviewed his medications and do not see any drug-inducing lupus offender at first glance. I offered Alex Dunn an appointment in 4 weeks to check back in or sooner if needed; he would like to see rheum team first then consider returning back to see ID team. I offered we are here should he need [place] sooner especially if infectious symptoms (night sweats, chills, weight loss) set back in that would warrant earlier evaluation. He thanked me for my call.   Janene Madeira, NP

## 2018-05-19 ENCOUNTER — Ambulatory Visit: Payer: Medicare Other | Admitting: Internal Medicine

## 2018-05-19 ENCOUNTER — Encounter

## 2018-05-19 ENCOUNTER — Encounter: Payer: Self-pay | Admitting: Internal Medicine

## 2018-05-19 VITALS — BP 130/62 | HR 73 | Temp 98.3°F | Wt 188.6 lb

## 2018-05-19 DIAGNOSIS — N183 Chronic kidney disease, stage 3 unspecified: Secondary | ICD-10-CM

## 2018-05-19 DIAGNOSIS — R768 Other specified abnormal immunological findings in serum: Secondary | ICD-10-CM | POA: Diagnosis not present

## 2018-05-19 DIAGNOSIS — R5382 Chronic fatigue, unspecified: Secondary | ICD-10-CM | POA: Diagnosis not present

## 2018-05-19 NOTE — Patient Instructions (Signed)
Rheumatology consultation as discussed  Return in 1 month for follow-up or sooner if there are any new or worsening symptoms

## 2018-05-19 NOTE — Progress Notes (Signed)
Subjective:    Patient ID: Alex Dunn, male    DOB: 04-09-1936, 82 y.o.   MRN: [id number]  HPI 82 year old patient who is seen today for follow-up of chronic fatigue and weight loss. He has been seen by a number of specialists including cardiology ID and renal medicine. His fatigue continues to fluctuate and he has been quite symptomatic for the past 3 days.  Fortunately, more recently he has had some weight gain.  He has had no further episodes of fever or chills. He has had multiple studies performed and more recently has had  a positive ANA with positive ds DNA raising the possibility of lupus.  He was seen in the ED recently with significant right flank pain that was felt to be musculoligamentous but the possibility of serositis is a consideration.  He has had no cutaneous abnormalities such as discoid rash oral ulceration photosensitivity or patchy alopecia. He has been seen by nephrology for polycystic kidney disease and presumably urinary sediment was not active Total complement was elevated (not low)  His main complaint is severe fatigue but he really denies any muscle joint pain or significant stiffness.  He denies any Raynaud's.  He denies any CNS symptoms  Studies NOT  obtained have include anti-Sm direct [place] test antiphospholipid antibodies radiographs of joints.  Evaluation has included a 2D echocardiogram that reveals no evidence of pericardial disease  There has been no hematologic manifestations such as leukopenia or thrombocytopenia  Past Medical History:  Diagnosis Date  . ANEMIA-IRON DEFICIENCY 06/23/2007  . BENIGN PROSTATIC HYPERTROPHY 06/23/2007  . GERD 06/23/2007  . Hypertension      Social History   Socioeconomic History  . Marital status: Married    Spouse name: Not on file  . Number of children: Not on file  . Years of education: Not on file  . Highest education level: Not on file  Occupational History  . Not on file  Social Needs  .  Financial resource strain: Not on file  . Food insecurity:    Worry: Not on file    Inability: Not on file  . Transportation needs:    Medical: Not on file    Non-medical: Not on file  Tobacco Use  . Smoking status: Never Smoker  . Smokeless tobacco: Never Used  Substance and Sexual Activity  . Alcohol use: No  . Drug use: No  . Sexual activity: Not on file  Lifestyle  . Physical activity:    Days per week: Not on file    Minutes per session: Not on file  . Stress: Not on file  Relationships  . Social connections:    Talks on phone: Not on file    Gets together: Not on file    Attends religious service: Not on file    Active member of club or organization: Not on file    Attends meetings of clubs or organizations: Not on file    Relationship status: Not on file  . Intimate partner violence:    Fear of current or ex partner: Not on file    Emotionally abused: Not on file    Physically abused: Not on file    Forced sexual activity: Not on file  Other Topics Concern  . Not on file  Social History Narrative  . Not on file    Past Surgical History:  Procedure Laterality Date  . HAND SURGERY      Family History  Problem Relation Age of Onset  .  Stroke Brother   . Cancer Brother        prostate ca    Allergies  Allergen Reactions  . Codeine Phosphate Other (See Comments)    Seen odd things crawling up wall  . Lexapro [Escitalopram Oxalate] Other (See Comments)    shaking    Current Outpatient Medications on File Prior to Visit  Medication Sig Dispense Refill  . ergocalciferol (VITAMIN D2) 50000 units capsule Take 1 capsule (50,000 Units total) by mouth once a week. 12 capsule 0  . pantoprazole (PROTONIX) 40 MG tablet Take 1 tablet (40 mg total) by mouth daily as needed. (Patient taking differently: Take 40 mg by mouth daily as needed (heartburn). ) 90 tablet 3   No current facility-administered medications on file prior to visit.     BP 130/62 (BP Location:  Right Arm, Patient Position: Sitting, Cuff Size: Large)   Pulse 73   Temp 98.3 F (36.8 C) (Oral)   Wt 188 lb 9.6 oz (85.5 kg)   SpO2 98%   BMI 25.58 kg/m      Review of Systems  Constitutional: Positive for activity change, appetite change, chills, fatigue and unexpected weight change. Negative for fever.  HENT: Negative for congestion, dental problem, ear pain, hearing loss, sore throat, tinnitus, trouble swallowing and voice change.   Eyes: Negative for pain, discharge and visual disturbance.  Respiratory: Negative for cough, chest tightness, wheezing and stridor.   Cardiovascular: Negative for chest pain, palpitations and leg swelling.  Gastrointestinal: Negative for abdominal distention, abdominal pain, blood in stool, constipation, diarrhea, nausea and vomiting.  Genitourinary: Positive for flank pain. Negative for difficulty urinating, discharge, genital sores, hematuria and urgency.  Musculoskeletal: Negative for arthralgias, back pain, gait problem, joint swelling, myalgias and neck stiffness.  Skin: Negative for rash.  Neurological: Positive for weakness. Negative for dizziness, syncope, speech difficulty, numbness and headaches.  Hematological: Negative for adenopathy. Does not bruise/bleed easily.  Psychiatric/Behavioral: Negative for behavioral problems and dysphoric mood. The patient is not nervous/anxious.        Objective:   Physical Exam  Constitutional: He is oriented to person, place, and time. He appears well-developed.  HENT:  Head: Normocephalic.  Right Ear: External ear normal.  Left Ear: External ear normal.  Eyes: Conjunctivae and EOM are normal.  Neck: Normal range of motion.  No lymphadenopathy  Cardiovascular: Normal rate and normal heart sounds.  Pulmonary/Chest: Breath sounds normal.  Abdominal: Bowel sounds are normal.  Musculoskeletal: Normal range of motion. He exhibits no edema or tenderness.  No synovitis  Neurological: He is alert and  oriented to person, place, and time.  Skin:  No rash or alopecia or oral ulcers  Psychiatric: He has a normal mood and affect. His behavior is normal.          Assessment & Plan:   82 year old patient with chronic fatigue weight loss history of fever and diaphoresis.  Patient has positive double-stranded DNA. Episode of right flank pain likely musculoligamentous and not secondary to a serositis  No history of cutaneous joint renal hematologic or CNS issues consistent with lupus.  We will pursue rheumatology consultation  Marletta Lor

## 2018-05-21 ENCOUNTER — Telehealth: Payer: Self-pay

## 2018-05-21 NOTE — Telephone Encounter (Signed)
Called patient and left a detailed message on the home answering machine. Patient has been informed that [facility] Has received the referral and that the patient needs to call their office to schedule the appointment if someone from that office has not reached out to them.

## 2018-05-21 NOTE — Telephone Encounter (Signed)
Copied from [place] [id number]. Topic: Referral - Status >> May 21, 2018  9:38 AM Conception Chancy, NT wrote: Reason for CRM: patient wife is calling and states that her husband is supposed to be referred to a rheumatologist and she was under the impression if the office called and made the appointment the patient would be seen sooner. Patient wife would like to know where she is supposed to call to schedule this or if the office is going to schedule.  >> May 21, 2018  1:45 PM Virl Cagey, CMA wrote: Rheum will call the patient to schedule their appt.  Once we place referral if they schedule their own appts we do not have much say over scheduling. The patient is more than welcome to reach out and inquire herself. Its [facility]

## 2018-06-01 NOTE — Progress Notes (Signed)
Office Visit Note  Patient: Alex Dunn             Date of Birth: 03/01/1936           MRN: [id number]             PCP: Marletta Lor, MD Referring: Marletta Lor, MD Visit Date: 06/15/2018 Occupation: Retired Chief Financial Officer  Subjective:  Fatigue   History of Present Illness: Alex Dunn is a 82 y.o. male seen in consultation per request of his PCP.  According to patient in March 2019 when he went to work-related job and he was a staying in a motel he woke up with night sweats and flulike symptoms.  He states the symptoms lasted for 5 to 6 hours and later he felt better.  He has had recurrent episodes since then which lasted for a few hours.  Gradually the symptoms became more prolonged and started lasting for 2 weeks.  According to him the last episode was about a week ago.  He states in between the episode he stayed very tired and the fatigue is worse now.  He was initially seen by his PCP and had some lab work.  He was referred to cardiologist and the cardiology work-up was negative per patient.  He states he was also seen by a kidney specialist who diagnosed him with a cystic kidney disease.  He also has infectious disease work-up which was negative per patient.  He continues to have pain and discomfort especially in his bilateral knee joints in his lower and lower extremities.  He has been experiencing some numbness in his right hand and tingling sensation at night which gets  better in the morning.  He has noticed some rash on his bilateral feet.  He denies any history of Rayauds.  There is no history of sicca symptoms, oral ulcers nasal ulcers.  According to patient from January to March she lost 22 pounds.  Since then he has gained about 5 pounds.  Activities of Daily Living:  Patient reports morning stiffness for 1 hour.   Patient Reports nocturnal pain.  Difficulty dressing/grooming: Denies Difficulty climbing stairs: Reports Difficulty getting out of chair:  Denies Difficulty using hands for taps, buttons, cutlery, and/or writing: Reports  Review of Systems  Constitutional: Positive for fatigue and weight loss. Negative for night sweats.  HENT: Negative for mouth sores, trouble swallowing, trouble swallowing, mouth dryness and nose dryness.   Eyes: Negative for redness and dryness.  Respiratory: Positive for shortness of breath. Negative for difficulty breathing.   Cardiovascular: Negative for chest pain, palpitations, hypertension, irregular heartbeat and swelling in legs/feet.  Gastrointestinal: Negative for constipation and diarrhea.  Endocrine: Negative for increased urination.  Genitourinary: Negative for difficulty urinating.  Musculoskeletal: Positive for arthralgias, gait problem, joint pain, muscle weakness and morning stiffness. Negative for joint swelling, myalgias, muscle tenderness and myalgias.  Skin: Positive for rash. Negative for color change, hair loss, nodules/bumps, skin tightness, ulcers and sensitivity to sunlight.  Allergic/Immunologic: Negative for susceptible to infections.  Neurological: Positive for weakness. Negative for dizziness, fainting, memory loss and night sweats.  Hematological: Negative for bruising/bleeding tendency and swollen glands.  Psychiatric/Behavioral: Positive for sleep disturbance. Negative for depressed mood. The patient is not nervous/anxious.     PMFS History:  Patient Active Problem List   Diagnosis Date Noted  . Essential hypertension 04/15/2018  . Diverticulosis 04/15/2018  . Abdominal aortic atherosclerosis ([place]) 04/15/2018  . Elevated hemidiaphragm 04/15/2018  . Polycystic kidney  disease 04/06/2018  . Night sweats 03/13/2018  . Fatigue 03/13/2018  . Chronic kidney disease 10/18/2016  . ANEMIA-IRON DEFICIENCY 06/23/2007  . GERD 06/23/2007  . BPH (benign prostatic hyperplasia) 06/23/2007    Past Medical History:  Diagnosis Date  . ANEMIA-IRON DEFICIENCY 06/23/2007  . BENIGN  PROSTATIC HYPERTROPHY 06/23/2007  . GERD 06/23/2007  . Hypertension     Family History  Problem Relation Age of Onset  . Stroke Brother   . Cancer Brother        prostate ca   Past Surgical History:  Procedure Laterality Date  . HAND SURGERY    . LUNG SURGERY     Social History   Social History Narrative  . Not on file    Objective: Vital Signs: BP 135/66 (BP Location: Right Arm, Patient Position: Sitting, Cuff Size: Normal)   Pulse 64   Resp 16   Ht 6' (1.829 m)   Wt 187 lb 3.2 oz (84.9 kg)   BMI 25.39 kg/m    Physical Exam  Constitutional: He is oriented to person, place, and time. He appears well-developed and well-nourished.  HENT:  Head: Normocephalic and atraumatic.  Eyes: Pupils are equal, round, and reactive to light. Conjunctivae and EOM are normal.  Neck: Normal range of motion. Neck supple.  Cardiovascular: Normal rate, regular rhythm and normal heart sounds.  Pulmonary/Chest: Effort normal and breath sounds normal.  Abdominal: Soft. Bowel sounds are normal.  Neurological: He is alert and oriented to person, place, and time.  Skin: Skin is warm and dry. Capillary refill takes less than 2 seconds.  Psychiatric: He has a normal mood and affect. His behavior is normal.  Nursing note and vitals reviewed.    Musculoskeletal Exam: C-spine good range of motion.  Thoracic and lumbar spine good range of motion.  Shoulder joints elbow joints wrist joints were in good range of motion.  He has some DIP PIP thickening.  Synovitis was noted.  Hip joints knee joints ankles MTPs PIPs were in good range of motion.  He had painful range of motion of bilateral knee joints.  He had difficulty getting up from the sitting position.  CDAI Exam: No CDAI exam completed.   Investigation: Findings:  04/15/2018: CK 40, hep C negative, HIV negative, RPR negative, CRP 1.520, T4 0.89 04/06/2018 RNP negative, Ro-, La -, Scl-70 -, Smith negative, dsDNA 32, anti-Jo 1 negative, burgdorferi  antibody negative  05/06/18: EBV IgM negative, Hgb A1c 5.6, Hep B -, ACTH 13, Cortisol 8.7, RF <14, CH50 >60, ANA 1:80 Homogenous May 2019 ESR 6, PPD negative March 2019 vitamin D 16.71  Component     Latest Ref Rng & Units 05/06/2018  EBV VCA IgM     U/mL <36.00  EBV VCA IgG     U/mL 119.00 (H)  EBV NA IgG     U/mL 275.00 (H)  Interpretation        Hemoglobin A1C     <5.7 % of total Hgb 5.6  Mean Plasma Glucose     (calc) 114  eAG (mmol/L)     (calc) 6.3  Hepatitis B Surface Ag     NON-REACTI NON-REACTIVE  C206 ACTH     6 - 50 pg/mL 13  Cortisol - AM     mcg/dL 8.7  RA Latex Turbid.     <14 IU/mL <14  Compl, Total (CH50)     31 - 60 U/mL >60 (H)   Component     Latest Ref Rng &  Units 04/16/2018  Ribonucleic Protein     0.0 - 0.9 AI <0.2  SSA (Ro) (ENA) Antibody, IgG     0.0 - 0.9 AI <0.2  Scleroderma (Scl-70) (ENA) Antibody, IgG     0.0 - 0.9 AI <0.2  ENA SM Ab Ser-aCnc     0.0 - 0.9 AI <0.2  SSB (La) (ENA) Antibody, IgG     0.0 - 0.9 AI <0.2  ds DNA Ab     0 - 9 IU/mL 32 (H)  Homogeneous Pattern      1:80  NOTE:      Comment  ANA Ab, IFA      Positive (A)  Anti JO-1     0.0 - 0.9 AI <0.2  B burgdorferi Ab IgG+IgM     0.00 - 0.90 ISR <0.91   Component     Latest Ref Rng & Units 04/15/2018  CK Total     44 - 196 U/L 40 (L)  CK, MB     0 - 5.0 ng/mL 1.3  Relative Index     0 - 4.0 3.3  Hepatitis C Ab     NON-REACTI NON-REACTIVE  SIGNAL TO CUT-OFF     <1.00 0.02  HIV     NON-REACTI NON-REACTIVE  RPR     NON-REACTI NON-REACTIVE  C-Reactive Protein, Cardiac     0.000 - 5.000 mg/L 1.520  T4,Free(Direct)     0.60 - 1.60 ng/dL 0.89   Imaging: Xr Knee 3 View Left  Result Date: 06/15/2018 Moderate medial compartment narrowing was noted.  No chondrocalcinosis was noted.  Severe patellofemoral narrowing was noted. Impression: These findings are consistent with moderate osteoarthritis and severe chondromalacia patella.  Xr Knee 3 View Right  Result  Date: 06/15/2018 Moderate to severe lateral compartment narrowing was noted.  No chondrocalcinosis was noted.  Moderate patellofemoral narrowing was noted. Impression: These findings are consistent with moderate to severe osteoarthritis and moderate chondromalacia patella.   Recent Labs: Lab Results  Component Value Date   WBC 6.1 05/03/2018   HGB 14.0 05/03/2018   PLT 167 05/03/2018   NA 142 05/03/2018   K 4.4 05/03/2018   CL 107 05/03/2018   CO2 29 05/03/2018   GLUCOSE 103 (H) 05/03/2018   BUN 21 05/03/2018   CREATININE 1.50 (H) 05/03/2018   BILITOT 0.5 05/03/2018   ALKPHOS 66 05/03/2018   AST 16 05/03/2018   ALT 14 05/03/2018   PROT 6.2 (L) 05/03/2018   ALBUMIN 3.3 (L) 05/03/2018   CALCIUM 9.1 05/03/2018   GFRAA 49 (L) 05/03/2018    Speciality Comments: No specialty comments available.  Procedures:  No procedures performed Allergies: Codeine phosphate and Lexapro [escitalopram oxalate]   Assessment / Plan:     Visit Diagnoses: Chronic fatigue -complains of episodic fatigue since March 2019 with the night sweats.  He has had extensive work-up so far which has been all negative except for positive ANA and double-stranded DNA.  Plan: TSH, VITAMIN D 25 Hydroxy (Vit-D Deficiency, Fractures), CK, QuantiFERON-TB Gold Plus  Positive ANA (antinuclear antibody) - ANA 1:80 Homogenous, RF -, Ro-, La-, smith -, RNP-, CH50 >60, CK 40, dsDNA 32, Anti-Jo 1 negative, Scl-70 negative.  He complains of fatigue and pain in his bilateral knee joints.  He has no other clinical features of autoimmune disease.  All his work-up by infectious disease nephrology and cardiology has been negative.  I will obtain following labs today.  I will also start him on low-dose prednisone as he  is feeling quite uncomfortable.- Plan: TSH, VITAMIN D 25 Hydroxy (Vit-D Deficiency, Fractures), ANA, Pan-ANCA, C3 and C4, Cardiolipin antibodies, IgG, IgM, IgA, Lupus Anticoagulant Eval w/Reflex, Beta-2 glycoprotein  antibodies, Cyclic citrul peptide antibody, IgG, Glucose 6 phosphate dehydrogenase, CK, Parvovirus B19 antibody, IgG and IgM  Positive double stranded DNA antibody test  Chronic pain of both knees - Plan: XR KNEE 3 VIEW LEFT, XR KNEE 3 VIEW RIGHT.  The x-ray of the knee joints were consistent with moderate osteoarthritis and moderate chondromalacia patella.  DDD (degenerative disc disease), cervical -she has limited range of motion of the cervical spine.  Vitamin D deficiency-patient reports that he took vitamin D 50,000 units once a week for some time.  Essential hypertension  Abdominal aortic atherosclerosis (HCC)  History of gastroesophageal reflux (GERD)  History of diverticulosis  Polycystic kidney disease  History of chronic kidney disease  History of anemia   Orders: Orders Placed This Encounter  Procedures  . XR KNEE 3 VIEW LEFT  . XR KNEE 3 VIEW RIGHT  . TSH  . VITAMIN D 25 Hydroxy (Vit-D Deficiency, Fractures)  . ANA  . Pan-ANCA  . C3 and C4  . Cardiolipin antibodies, IgG, IgM, IgA  . Lupus Anticoagulant Eval w/Reflex  . Beta-2 glycoprotein antibodies  . Cyclic citrul peptide antibody, IgG  . Glucose 6 phosphate dehydrogenase  . CK  . Parvovirus B19 antibody, IgG and IgM  . QuantiFERON-TB Gold Plus   Meds ordered this encounter  Medications  . predniSONE (DELTASONE) 5 MG tablet    Sig: Take 2 tablets (10 mg total) by mouth daily with breakfast.    Dispense:  74 tablet    Refill:  0    Face-to-face time spent with patient was  minutes. Greater than 50% of time was spent in counseling and coordination of care.  Follow-Up Instructions: Return for Positive ANA, fatigue.   Bo Merino, MD  Note - This record has been created using Editor, commissioning.  Chart creation errors have been sought, but may not always  have been located. Such creation errors do not reflect on  the standard of medical care.

## 2018-06-15 ENCOUNTER — Encounter: Payer: Self-pay | Admitting: Rheumatology

## 2018-06-15 ENCOUNTER — Ambulatory Visit: Payer: Medicare Other | Admitting: Rheumatology

## 2018-06-15 ENCOUNTER — Ambulatory Visit (INDEPENDENT_AMBULATORY_CARE_PROVIDER_SITE_OTHER): Payer: Self-pay

## 2018-06-15 VITALS — BP 135/66 | HR 64 | Resp 16 | Ht 72.0 in | Wt 187.2 lb

## 2018-06-15 DIAGNOSIS — I700 Atherosclerosis of aorta: Secondary | ICD-10-CM

## 2018-06-15 DIAGNOSIS — M25561 Pain in right knee: Secondary | ICD-10-CM | POA: Diagnosis not present

## 2018-06-15 DIAGNOSIS — R5382 Chronic fatigue, unspecified: Secondary | ICD-10-CM | POA: Diagnosis not present

## 2018-06-15 DIAGNOSIS — Z87448 Personal history of other diseases of urinary system: Secondary | ICD-10-CM

## 2018-06-15 DIAGNOSIS — Z862 Personal history of diseases of the blood and blood-forming organs and certain disorders involving the immune mechanism: Secondary | ICD-10-CM

## 2018-06-15 DIAGNOSIS — M25562 Pain in left knee: Secondary | ICD-10-CM

## 2018-06-15 DIAGNOSIS — G8929 Other chronic pain: Secondary | ICD-10-CM

## 2018-06-15 DIAGNOSIS — Q613 Polycystic kidney, unspecified: Secondary | ICD-10-CM

## 2018-06-15 DIAGNOSIS — I10 Essential (primary) hypertension: Secondary | ICD-10-CM

## 2018-06-15 DIAGNOSIS — Z8719 Personal history of other diseases of the digestive system: Secondary | ICD-10-CM

## 2018-06-15 DIAGNOSIS — R768 Other specified abnormal immunological findings in serum: Secondary | ICD-10-CM | POA: Diagnosis not present

## 2018-06-15 DIAGNOSIS — M5030 Other cervical disc degeneration, unspecified cervical region: Secondary | ICD-10-CM

## 2018-06-15 DIAGNOSIS — E559 Vitamin D deficiency, unspecified: Secondary | ICD-10-CM

## 2018-06-15 MED ORDER — PREDNISONE 5 MG PO TABS: 10.0000 mg | ORAL_TABLET | Freq: Every day | ORAL | 0 refills | Status: DC

## 2018-06-17 ENCOUNTER — Ambulatory Visit: Payer: Self-pay | Admitting: Rheumatology

## 2018-06-18 LAB — QUANTIFERON-TB GOLD PLUS
Mitogen-NIL: >10 IU/mL
NIL: 0.04 [IU]/mL
QuantiFERON-TB Gold Plus: NEGATIVE
TB1-NIL: 0.03 [IU]/mL
TB2-NIL: 0.04 IU/mL

## 2018-06-18 LAB — PAN-ANCA: Myeloperoxidase Abs: <1 AI

## 2018-06-18 LAB — LUPUS ANTICOAGULANT EVAL W/ REFLEX
DRVVT: 30 s (ref <=45)
PTT-LA Screen: 32 s (ref <=40)

## 2018-06-18 LAB — GLUCOSE 6 PHOSPHATE DEHYDROGENASE: G-6PDH: 19.1 U/g Hgb (ref 7.0–20.5)

## 2018-06-18 LAB — BETA-2 GLYCOPROTEIN ANTIBODIES: Beta-2 Glyco 1 IgM: <9 SMU (ref <=20)

## 2018-06-18 LAB — PARVOVIRUS B19 ANTIBODY, IGG AND IGM
PARVOVIRUS B19 IGG: 5.2 — AB (ref <0.9)
PARVOVIRUS B19 IGM: 0.1 (ref <0.9)

## 2018-06-18 LAB — ANTI-NUCLEAR AB-TITER (ANA TITER)

## 2018-06-18 LAB — CARDIOLIPIN ANTIBODIES, IGG, IGM, IGA

## 2018-06-18 LAB — C3 AND C4
C3 Complement: 128 mg/dL (ref 82–185)
C4 Complement: 28 mg/dL (ref 15–53)

## 2018-06-18 LAB — CYCLIC CITRUL PEPTIDE ANTIBODY, IGG

## 2018-06-18 LAB — TSH: TSH: 2.08 mIU/L (ref 0.40–4.50)

## 2018-06-18 LAB — ANA: Anti Nuclear Antibody(ANA): POSITIVE — AB

## 2018-06-18 LAB — VITAMIN D 25 HYDROXY (VIT D DEFICIENCY, FRACTURES): Vit D, 25-Hydroxy: 35 ng/mL (ref 30–100)

## 2018-06-18 LAB — CK: Total CK: 64 U/L (ref 44–196)

## 2018-06-30 DIAGNOSIS — M5136 Other intervertebral disc degeneration, lumbar region: Secondary | ICD-10-CM

## 2018-06-30 DIAGNOSIS — M51369 Other intervertebral disc degeneration, lumbar region without mention of lumbar back pain or lower extremity pain: Secondary | ICD-10-CM

## 2018-06-30 DIAGNOSIS — M170 Bilateral primary osteoarthritis of knee: Secondary | ICD-10-CM

## 2018-06-30 DIAGNOSIS — M5030 Other cervical disc degeneration, unspecified cervical region: Secondary | ICD-10-CM | POA: Insufficient documentation

## 2018-06-30 HISTORY — DX: Other intervertebral disc degeneration, lumbar region without mention of lumbar back pain or lower extremity pain: M51.369

## 2018-06-30 HISTORY — DX: Other intervertebral disc degeneration, lumbar region: M51.36

## 2018-06-30 HISTORY — DX: Bilateral primary osteoarthritis of knee: M17.0

## 2018-06-30 NOTE — Progress Notes (Signed)
Office Visit Note  Patient: Alex Dunn             Date of Birth: 1935-12-25           MRN: [id number]             PCP: Marletta Lor, MD Referring: Marletta Lor, MD Visit Date: 07/14/2018 Occupation: '@GUAROCC'$ @  Subjective:  fatigue  History of Present Illness: Alex Dunn is a 82 y.o. male with history of fatigue, osteoarthritis and DDD.  He was seen here for low titer positive ANA and double-stranded DNA.  He was placed on prednisone 10 mg p.o. daily which he could not tolerate.  He reduce the dose to 5 mg p.o. daily.  He had noticed some improvement in his symptoms of fatigue but not remarkable.  He states he is very tired in the morning it does get better during the day and then it gets weak again in the evening.  He has not been very active.  He also complains of shortness of breath on exertion.  He had cardiology work-up in the past which was negative.  He denies any perspiration.  He has some stiffness in his hands but no joint swelling.  Activities of Daily Living:  Patient reports morning stiffness for 30  minutes.   Patient Denies nocturnal pain.  Difficulty dressing/grooming: Denies Difficulty climbing stairs: Reports Difficulty getting out of chair: Reports Difficulty using hands for taps, buttons, cutlery, and/or writing: Denies  Review of Systems  Constitutional: Positive for fatigue. Negative for night sweats.  HENT: Negative for mouth sores, trouble swallowing, trouble swallowing, mouth dryness and nose dryness.   Eyes: Negative for redness, visual disturbance and dryness.  Respiratory: Negative for cough, hemoptysis, shortness of breath and difficulty breathing.   Cardiovascular: Negative for chest pain, palpitations, hypertension, irregular heartbeat and swelling in legs/feet.  Gastrointestinal: Negative for blood in stool, constipation and diarrhea.  Endocrine: Negative for increased urination.  Genitourinary: Positive for urgency.  Negative for painful urination.  Musculoskeletal: Positive for morning stiffness. Negative for arthralgias, joint pain, joint swelling, myalgias, muscle weakness, muscle tenderness and myalgias.  Skin: Negative for color change, rash, hair loss, nodules/bumps, skin tightness, ulcers and sensitivity to sunlight.  Allergic/Immunologic: Negative for susceptible to infections.  Neurological: Negative for dizziness, fainting, memory loss, night sweats and weakness.  Hematological: Negative for swollen glands.  Psychiatric/Behavioral: Positive for sleep disturbance. Negative for depressed mood. The patient is not nervous/anxious.     PMFS History:  Patient Active Problem List   Diagnosis Date Noted  . Primary osteoarthritis of both knees 06/30/2018  . DDD (degenerative disc disease), cervical 06/30/2018  . Essential hypertension 04/15/2018  . Diverticulosis 04/15/2018  . Abdominal aortic atherosclerosis ([place]) 04/15/2018  . Elevated hemidiaphragm 04/15/2018  . Polycystic kidney disease 04/06/2018  . Night sweats 03/13/2018  . Fatigue 03/13/2018  . Chronic kidney disease 10/18/2016  . ANEMIA-IRON DEFICIENCY 06/23/2007  . GERD 06/23/2007  . BPH (benign prostatic hyperplasia) 06/23/2007    Past Medical History:  Diagnosis Date  . ANEMIA-IRON DEFICIENCY 06/23/2007  . BENIGN PROSTATIC HYPERTROPHY 06/23/2007  . GERD 06/23/2007  . Hypertension     Family History  Problem Relation Age of Onset  . Stroke Brother   . Cancer Brother        prostate ca   Past Surgical History:  Procedure Laterality Date  . HAND SURGERY    . LUNG SURGERY     Social History   Social History Narrative  .  Not on file    Objective: Vital Signs: BP (!) 145/77 (BP Location: Left Arm, Patient Position: Sitting, Cuff Size: Normal)   Pulse 75   Resp 15   Ht 6' (1.829 m)   Wt 191 lb 3.2 oz (86.7 kg)   BMI 25.93 kg/m    Physical Exam  Constitutional: He is oriented to person, place, and time. He appears  well-developed and well-nourished.  HENT:  Head: Normocephalic and atraumatic.  Eyes: Pupils are equal, round, and reactive to light. Conjunctivae and EOM are normal.  Neck: Normal range of motion. Neck supple.  Cardiovascular: Normal rate, regular rhythm and normal heart sounds.  Pulmonary/Chest: Effort normal and breath sounds normal.  Abdominal: Soft. Bowel sounds are normal.  Lymphadenopathy:    He has no cervical adenopathy.  Neurological: He is alert and oriented to person, place, and time.  Skin: Skin is warm and dry. Capillary refill takes less than 2 seconds.  Psychiatric: He has a normal mood and affect. His behavior is normal.  Nursing note and vitals reviewed.    Musculoskeletal Exam: C-spine thoracic lumbar spine good range of motion.  Shoulder joints elbow joints wrist joint MCPs PIPs DIPs were in good range of motion with no synovitis.  Hip joints knee joints ankles MTPs PIPs were in good range of motion with no synovitis.  He has some crepitus with range of motion of bilateral knee joints.  No muscular weakness or tenderness was noted on examination.  CDAI Exam: CDAI Score: Not documented Patient Global Assessment: Not documented; Provider Global Assessment: Not documented Swollen: Not documented; Tender: Not documented Joint Exam   Not documented   There is currently no information documented on the homunculus. Go to the Rheumatology activity and complete the homunculus joint exam.  Investigation: Findings:  04/15/2018: CK 40, hep C negative, HIV negative, RPR negative, CRP 1.520, T4 0.89 04/06/2018 RNP negative, Ro-, La -, Scl-70 -, Smith negative, dsDNA 32, anti-Jo 1 negative, burgdorferi antibody negative  05/06/18: EBV IgM negative, Hgb A1c 5.6, Hep B -, ACTH 13, Cortisol 8.7, RF <14, CH50 >60, ANA 1:80 Homogenous May 2019 ESR 6, PPD negative March 2019 vitamin D 16.71   Imaging: Xr Knee 3 View Left  Result Date: 06/15/2018 Moderate medial compartment narrowing  was noted.  No chondrocalcinosis was noted.  Severe patellofemoral narrowing was noted. Impression: These findings are consistent with moderate osteoarthritis and severe chondromalacia patella.  Xr Knee 3 View Right  Result Date: 06/15/2018 Moderate to severe lateral compartment narrowing was noted.  No chondrocalcinosis was noted.  Moderate patellofemoral narrowing was noted. Impression: These findings are consistent with moderate to severe osteoarthritis and moderate chondromalacia patella.   Recent Labs: Lab Results  Component Value Date   WBC 6.1 05/03/2018   HGB 14.0 05/03/2018   PLT 167 05/03/2018   NA 142 05/03/2018   K 4.4 05/03/2018   CL 107 05/03/2018   CO2 29 05/03/2018   GLUCOSE 103 (H) 05/03/2018   BUN 21 05/03/2018   CREATININE 1.50 (H) 05/03/2018   BILITOT 0.5 05/03/2018   ALKPHOS 66 05/03/2018   AST 16 05/03/2018   ALT 14 05/03/2018   PROT 6.2 (L) 05/03/2018   ALBUMIN 3.3 (L) 05/03/2018   CALCIUM 9.1 05/03/2018   GFRAA 49 (L) 05/03/2018   QFTBGOLDPLUS NEGATIVE 06/15/2018  June 15, 2018 TB: Negative, G6PD normal CK 64, TSH normal, vitamin D 35 Parvo negative, ANA 1: 80 homogeneous, anticardiolipin negative, beta-2 GP 1-, lupus anticoagulant negative, complements normal, ANCA  negative, anti-CCP negative  Speciality Comments: No specialty comments available.  Procedures:  No procedures performed Allergies: Codeine phosphate and Lexapro [escitalopram oxalate]   Assessment / Plan:     Visit Diagnoses: Positive ANA (antinuclear antibody) - ANA positive, dsDNA positive, history of fatigue.  No other clinical features of autoimmune disease.  I will check AVISE labs.  Chronic fatigue-I am uncertain about the true etiology of his fatigue.  He does not have any clinical features of polymyalgia rheumatica.  His sed rate was normal.  He does not have any muscle weakness on examination.  He does not state that the fatigue is extreme and he is unable to sustain any  activity.  His cardiology work-up has been negative.  I am concerned about myasthenia gravis.  We will refer him to neurology.  I will also obtain for the antibodies today.  I will obtain serum cortisone level.  He took prednisone 5 mg p.o. daily and noticed some improvement in his symptoms.  I have advised him to take prednisone 5 mg every other day for a week and then discontinue.  Primary osteoarthritis of both knees - Bilateral moderate with moderate chondromalacia patella  DDD (degenerative disc disease), cervical-he has some discomfort with range of motion.  Stage 3 chronic kidney disease (HCC)-he has been followed up by nephrology.  Essential hypertension-his blood pressure is mildly elevated.  Diverticulosis  Benign prostatic hyperplasia with urinary obstruction  Gastroesophageal reflux disease without esophagitis   Orders: Orders Placed This Encounter  Procedures  . Acetylcholine Receptor, Binding  . Acetylcholine receptor, blocking Abs  . Acetylcholine Receptor, Modulating  . Other/Misc lab test  . Cortisol-am, blood  . Ambulatory referral to Neurology   No orders of the defined types were placed in this encounter.   Face-to-face time spent with patient was 30 minutes. Greater than 50% of time was spent in counseling and coordination of care.  Follow-Up Instructions: Return in about 3 weeks (around 08/04/2018) for +ANA, Fatigue, OA, DDD.   Alex Merino, MD  Note - This record has been created using Editor, commissioning.  Chart creation errors have been sought, but may not always  have been located. Such creation errors do not reflect on  the standard of medical care.

## 2018-07-14 ENCOUNTER — Ambulatory Visit: Payer: Medicare Other | Admitting: Rheumatology

## 2018-07-14 ENCOUNTER — Encounter: Payer: Self-pay | Admitting: Rheumatology

## 2018-07-14 VITALS — BP 145/77 | HR 75 | Resp 15 | Ht 72.0 in | Wt 191.2 lb

## 2018-07-14 DIAGNOSIS — R5382 Chronic fatigue, unspecified: Secondary | ICD-10-CM

## 2018-07-14 DIAGNOSIS — N138 Other obstructive and reflux uropathy: Secondary | ICD-10-CM

## 2018-07-14 DIAGNOSIS — D8989 Other specified disorders involving the immune mechanism, not elsewhere classified: Secondary | ICD-10-CM | POA: Diagnosis not present

## 2018-07-14 DIAGNOSIS — R7689 Other specified abnormal immunological findings in serum: Secondary | ICD-10-CM

## 2018-07-14 DIAGNOSIS — M6281 Muscle weakness (generalized): Secondary | ICD-10-CM

## 2018-07-14 DIAGNOSIS — M170 Bilateral primary osteoarthritis of knee: Secondary | ICD-10-CM

## 2018-07-14 DIAGNOSIS — I10 Essential (primary) hypertension: Secondary | ICD-10-CM

## 2018-07-14 DIAGNOSIS — K5790 Diverticulosis of intestine, part unspecified, without perforation or abscess without bleeding: Secondary | ICD-10-CM

## 2018-07-14 DIAGNOSIS — R768 Other specified abnormal immunological findings in serum: Secondary | ICD-10-CM

## 2018-07-14 DIAGNOSIS — M5030 Other cervical disc degeneration, unspecified cervical region: Secondary | ICD-10-CM | POA: Diagnosis not present

## 2018-07-14 DIAGNOSIS — N183 Chronic kidney disease, stage 3 unspecified: Secondary | ICD-10-CM

## 2018-07-14 DIAGNOSIS — N401 Enlarged prostate with lower urinary tract symptoms: Secondary | ICD-10-CM

## 2018-07-14 DIAGNOSIS — K219 Gastro-esophageal reflux disease without esophagitis: Secondary | ICD-10-CM

## 2018-07-15 ENCOUNTER — Telehealth: Payer: Self-pay | Admitting: Rheumatology

## 2018-07-15 ENCOUNTER — Other Ambulatory Visit: Payer: Self-pay

## 2018-07-15 DIAGNOSIS — R5382 Chronic fatigue, unspecified: Secondary | ICD-10-CM | POA: Diagnosis not present

## 2018-07-15 NOTE — Telephone Encounter (Signed)
Patient's wife [name] left a voicemail stating she called [facility] and the first available appointment was 09/01/18.  Dorothy stated that she was told if "Dr. Estanislado Pandy needed him to be sooner she would need to call and speak with the doctor on call."  [name] requested a return call.

## 2018-07-15 NOTE — Telephone Encounter (Signed)
Please call [facility] and explained that patient is having severe fatigue and muscle weakness.  I spoke with Dr. Bunnie Pion who recommended that he should be evaluated for myasthenia gravis.  Please try to schedule an appointment as soon as possible.

## 2018-07-15 NOTE — Telephone Encounter (Signed)
Spoke with [facility] and they have rescheduled his appointment for 07/20/18 with Dr. Coral Ceo.

## 2018-07-17 NOTE — Progress Notes (Signed)
Cortisol level is normal

## 2018-07-20 ENCOUNTER — Encounter: Payer: Self-pay | Admitting: Neurology

## 2018-07-20 ENCOUNTER — Telehealth: Payer: Self-pay | Admitting: Neurology

## 2018-07-20 ENCOUNTER — Ambulatory Visit: Payer: Medicare Other | Admitting: Neurology

## 2018-07-20 VITALS — BP 144/80 | HR 62 | Ht 72.0 in | Wt 189.0 lb

## 2018-07-20 DIAGNOSIS — M6281 Muscle weakness (generalized): Secondary | ICD-10-CM

## 2018-07-20 DIAGNOSIS — R61 Generalized hyperhidrosis: Secondary | ICD-10-CM

## 2018-07-20 DIAGNOSIS — E538 Deficiency of other specified B group vitamins: Secondary | ICD-10-CM

## 2018-07-20 DIAGNOSIS — G3281 Cerebellar ataxia in diseases classified elsewhere: Secondary | ICD-10-CM

## 2018-07-20 DIAGNOSIS — R634 Abnormal weight loss: Secondary | ICD-10-CM

## 2018-07-20 DIAGNOSIS — R292 Abnormal reflex: Secondary | ICD-10-CM

## 2018-07-20 DIAGNOSIS — R5382 Chronic fatigue, unspecified: Secondary | ICD-10-CM | POA: Diagnosis not present

## 2018-07-20 DIAGNOSIS — E519 Thiamine deficiency, unspecified: Secondary | ICD-10-CM

## 2018-07-20 NOTE — Telephone Encounter (Signed)
[employer] medicare Josem Kaufmann: [id number] (exp. 07/20/18 to 08/18/18) order sent to [facility]. They will reach out to the pt to schedule.

## 2018-07-20 NOTE — Progress Notes (Signed)
[facility]    Provider:  Dr Jaynee Eagles Referring Provider: Cy Blamer MD Primary Care Physician:  Marletta Lor, MD  CC:  fatigue  HPI:  Alex Dunn is a 82 y.o. male here as requested by Dr. Estanislado Pandy for chronic fatigue, muscle weakness.PMHx HTN, Polycystic kidney disease, fatigue, night sweats, anemia, gerd, BPH. Started in March 2019 with flu-like episode. Slowly progressive, getting more frequent and prolonged, fatigue daily generally tired all over, no headaches, no neck pain, no vision changes, no falls, no gait changes, no focal weakness, he feels generally fatigued worse after a morning bowel movement. Feels better with steroids. No ptosis, no facial weakness, no diplopia, no swallowing difficulties. He is not sleeping well because he gets up to urinate. Night sweats improved as is the fatigue on steroids. Flu-like symptoms resolved. No change in medication, no head trauma, no FHx of neurologic problems, Worse in the mornings when waking up at 7am feel better later in the day, he doesn't feel like he sleeps. They deny snoring or other sleep apnea symptoms. No neck pain or back pain. No radicular symptoms. No tremors currently. No dysphagia. He has difficulty walking when fatigued and cant walk, he feels imbalanced.   Reviewed notes, labs and imaging from outside physicians, which showed:  Reviewed Dr. Estanislado Pandy notes. March 2019 woke up with sweats and flu-like episodes, lasted 5-6 and now having recurrent episodes and becoming longer and more frequent. Lasting 2 weeks of fatigue and being tired. Has been evaluated by pcp, cardiology and rheumatology, cardiology workup negative, also infectious disease workup and has seen a kidney specialist. Also with arthralgias. He has gained weight.   Extensive workup reviewed: 04/15/2018: CK 40, hep C negative, HIV negative, RPR negative, CRP 1.520, T4 0.89 04/06/2018 RNP negative, Ro-, La -, Scl-70 -, Smith negative,  dsDNA 32, anti-Jo 1 negative, burgdorferi antibody negative  05/06/18: EBV IgM negative, Hgb A1c 5.6, Hep B -, ACTH 13, Cortisol 8.7, RF <14, CH50 >60, ANA 1:80 Homogenous May 2019 ESR 6, PPD negative March 2019 vitamin D 16.71  All negative except ANA and DSDNA+, Positive ANA (antinuclear antibody) - ANA 1:80 Homogenous, RF -, Ro-, La-, smith -, RNP-, CH50 >60, CK 40, dsDNA 32, Anti-Jo 1 negative, Scl-70 negative  TSH nml  Review of Systems: Patient complains of symptoms per HPI as well as the following symptoms: insomnia, sleepiness, weakness, decreased energy. Pertinent negatives and positives per HPI. All others negative.   Social History   Socioeconomic History  . Marital status: Married    Spouse name: Not on file  . Number of children: 1  . Years of education: Not on file  . Highest education level: High school graduate  Occupational History  . Not on file  Social Needs  . Financial resource strain: Not on file  . Food insecurity:    Worry: Not on file    Inability: Not on file  . Transportation needs:    Medical: Not on file    Non-medical: Not on file  Tobacco Use  . Smoking status: Never Smoker  . Smokeless tobacco: Never Used  Substance and Sexual Activity  . Alcohol use: Never    Frequency: Never  . Drug use: Never  . Sexual activity: Not on file  Lifestyle  . Physical activity:    Days per week: Not on file    Minutes per session: Not on file  . Stress: Not on file  Relationships  . Social connections:  Talks on phone: Not on file    Gets together: Not on file    Attends religious service: Not on file    Active member of club or organization: Not on file    Attends meetings of clubs or organizations: Not on file    Relationship status: Not on file  . Intimate partner violence:    Fear of current or ex partner: Not on file    Emotionally abused: Not on file    Physically abused: Not on file    Forced sexual activity: Not on file  Other Topics  Concern  . Not on file  Social History Narrative   Lives at home with his wife   Retired   Right handed   Caffeine: about 2 cups daily    Family History  Problem Relation Age of Onset  . Heart attack Mother   . Heart attack Father   . Heart attack Brother        "leaky valve"  . Neuromuscular disorder Neg Hx     Past Medical History:  Diagnosis Date  . ANEMIA-IRON DEFICIENCY 06/23/2007  . BENIGN PROSTATIC HYPERTROPHY 06/23/2007  . Diverticulosis   . GERD 06/23/2007  . Hypertension   . Polycystic kidney disease    stage 3 per wife    Past Surgical History:  Procedure Laterality Date  . HAND SURGERY    . LUNG SURGERY     for collapsed lung    Current Outpatient Medications  Medication Sig Dispense Refill  . pantoprazole (PROTONIX) 40 MG tablet Take 1 tablet (40 mg total) by mouth daily as needed. (Patient taking differently: Take 40 mg by mouth daily as needed (heartburn). ) 90 tablet 3   No current facility-administered medications for this visit.     Allergies as of 07/20/2018 - Review Complete 07/20/2018  Allergen Reaction Noted  . Codeine phosphate Other (See Comments)   . Lexapro [escitalopram oxalate] Other (See Comments) 05/03/2018    Vitals: BP (!) 144/80 (BP Location: Left Arm, Patient Position: Sitting)   Pulse 62   Ht 6' (1.829 m)   Wt 189 lb (85.7 kg)   BMI 25.63 kg/m  Last Weight:  Wt Readings from Last 1 Encounters:  07/20/18 189 lb (85.7 kg)   Last Height:   Ht Readings from Last 1 Encounters:  07/20/18 6' (1.829 m)    Physical exam: Exam: Gen: NAD, conversant                    CV: RRR, no MRG. No Carotid Bruits. No peripheral edema, warm, nontender Eyes: Conjunctivae clear without exudates or hemorrhage  Neuro: Detailed Neurologic Exam  Speech:    Speech is normal; fluent and spontaneous with normal comprehension.  Cognition:    The patient is oriented to person, place, and time;     recent and remote memory intact;      language fluent;     normal attention, concentration,     fund of knowledge Cranial Nerves:    The pupils are equal, round, and reactive to light. Attempted fundoscopic exam could not visualize. . Visual fields are full to finger confrontation. Extraocular movements are intact. Trigeminal sensation is intact and the muscles of mastication are normal. The face is symmetric. The palate elevates in the midline. Hearing intact. Voice is normal. Shoulder shrug is normal. The tongue has normal motion without fasciculations.   Coordination:    Normal finger to nose   Gait:    Heel-toe  and tandem gait with imbalance.   Motor Observation:    No asymmetry, no atrophy, and no involuntary movements noted. Tone:    Normal muscle tone.    Posture:    Posture is slightly stooped    Strength:    Strength is V/V in the upper and lower limbs.      Sensation: intact to LT     Reflex Exam:  DTR's:    Deep tendon reflexes in the upper and lower extremities are brisk for age bilaterally.   Toes:    The toes are equivocal bilaterally.   Clonus:    Clonus is absent.      Assessment/Plan:   82 y.o. male here as requested by Dr. Estanislado Pandy for chronic fatigue, muscle weakness.PMHx HTN, Polycystic kidney disease, fatigue, night sweats, anemia, gerd, BPH. Started in March 2019 with flu-like episode. Slowly progressive, getting more frequent and prolonged, fatigue daily generally tired all over, he has brisk reflexes for age, imbalance need to check brain and c-spine also emg/ncs   MRI brain w/wo contrast and cervical spine for strokes, myelopathy, other lesions, metastasis  EMG/NCS  - Low likelihood of myasthenia gravis, no diplopia or ptosis, no proximal weakness, he is worse in the morning which is not consistent with myasthenia gravis. Pending AchR antibodies. Will do repetitive nerve studies - emg/ncs to look for neuromuscular disease one arm and leg and RNS of one arm.  - Labs today  Orders  Placed This Encounter  Procedures  . MR BRAIN W WO CONTRAST  . MR CERVICAL SPINE WO CONTRAST  . B12 and Folate Panel  . Heavy metals, blood  . Vitamin B6  . B. burgdorfi Antibody  . Vitamin B1  . Methylmalonic acid, serum  . Multiple Myeloma Panel (SPEP&IFE w/QIG)  . Basic Metabolic Panel  . NCV with EMG(electromyography)      Sarina Ill, MD  [facility] [address] [place], [place] [address]  Phone [phone] Fax [phone]

## 2018-07-20 NOTE — Patient Instructions (Signed)
MRI of the brain and cervical spine Labs today EMG/NCS   Electromyoneurogram Electromyoneurogram is a test to check how well your muscles and nerves are working. This procedure includes the combined use of electromyogram (EMG) and nerve conduction study (NCS). EMG is used to look for muscular disorders. NCS, which is also called electroneurogram, measures how well your nerves are controlling your muscles. The procedures are usually performed together to check if your muscles and nerves are healthy. If the reaction to testing is abnormal, this can indicate disease or injury, such as peripheral nerve damage. Tell a health care provider about:  Any allergies you have.  All medicines you are taking, including vitamins, herbs, eye drops, creams, and over-the-counter medicines.  Any problems you or family members have had with anesthetic medicines.  Any blood disorders you have.  Any surgeries you have had.  Any medical conditions you have.  Any pacemaker you have. What are the risks? Generally, this is a safe procedure. However, problems may occur, including:  Infection where the electrodes were inserted.  Bleeding.  What happens before the procedure?  Ask your health care provider about: ? Changing or stopping your regular medicines. This is especially important if you are taking diabetes medicines or blood thinners. ? Taking medicines such as aspirin and ibuprofen. These medicines can thin your blood. Do not take these medicines before your procedure if your health care provider instructs you not to.  Your health care provider may ask you to avoid: ? Caffeine, such as coffee and tea. ? Nicotine. This includes cigarettes and anything with tobacco.  Do not use lotions or creams on the same day that you will be having the procedure. What happens during the procedure? For EMG:  Your health care provider will ask you to stay in a position so that he or she can access the muscle  that will be studied. You may be standing, sitting down, or lying down.  You may be given a medicine that numbs the area (local anesthetic).  A very thin needle that has an electrode on it will be inserted into your muscle.  Another small electrode will be placed on your skin near the muscle.  Your health care provider will ask you to continue to remain still.  The electrodes will send a signal that tells about the electrical activity of your muscles. You may see this on a monitor or hear it in the room.  After your muscles have been studied at rest, your health care provider will ask you to contract or flex your muscles. The electrodes will send a signal that tells about the electrical activity of your muscles.  Your health care provider will remove the electrodes and the electrode needles when the procedure is finished. The procedure may vary among health care providers and hospitals. For NCS:  An electrode that records your nerve activity (recording electrode) will be placed on your skin by the muscle that is being studied.  An electrode that is used as a reference (reference electrode) will be placed near the recording electrode.  A paste or gel will be applied to your skin between the recording electrode and the reference electrode.  Your nerve will be stimulated with a mild shock. Your health care provider will measure how much time it takes for your muscle to react.  Your health care provider will remove the electrodes and the gel when the procedure is finished. The procedure may vary among health care providers and hospitals. What happens  after the procedure?  It is your responsibility to obtain your test results. Ask your health care provider or the department performing the test when and how you will get your results.  Your health care provider may: ? Give you medicines for any pain. ? Monitor the insertion sites to make sure that they stop bleeding. This information is  not intended to replace advice given to you by your health care provider. Make sure you discuss any questions you have with your health care provider. Document Released: 02/21/2005 Document Revised: 03/28/2016 Document Reviewed: 12/12/2014 Elsevier Interactive Patient Education  2018 [employer].

## 2018-07-21 LAB — CORTISOL-AM, BLOOD: CORTISOL - AM: 12.7 ug/dL

## 2018-07-21 LAB — ACETYLCHOLINE RECEPTOR, BLOCKING: ACHR Blocking Abs: <15 % Inhibition (ref <15)

## 2018-07-21 LAB — ACETYLCHOLINE RECEPTOR, MODULATING: Acetylchol Modul Ab: 24 % Inhibition

## 2018-07-21 LAB — ACETYLCHOLINE RECEPTOR, BINDING: A CHR BINDING ABS: <0.3 nmol/L

## 2018-07-22 NOTE — Progress Notes (Signed)
Please notify patient that the cortisol level and the test for myasthenia gravis were negative.

## 2018-07-23 ENCOUNTER — Ambulatory Visit: Payer: Self-pay | Admitting: Rheumatology

## 2018-07-24 LAB — B. BURGDORFI ANTIBODIES

## 2018-07-24 LAB — BASIC METABOLIC PANEL
BUN / CREAT RATIO: 15 (ref 10–24)
BUN: 22 mg/dL (ref 8–27)
CHLORIDE: 104 mmol/L (ref 96–106)
CO2: 25 mmol/L (ref 20–29)
CREATININE: 1.43 mg/dL — AB (ref 0.76–1.27)
Calcium: 9.2 mg/dL (ref 8.6–10.2)
GFR calc Af Amer: 53 mL/min/{1.73_m2} — ABNORMAL LOW (ref >59)
GFR calc non Af Amer: 46 mL/min/{1.73_m2} — ABNORMAL LOW (ref >59)
GLUCOSE: 89 mg/dL (ref 65–99)
Potassium: 4.7 mmol/L (ref 3.5–5.2)
Sodium: 142 mmol/L (ref 134–144)

## 2018-07-24 LAB — MULTIPLE MYELOMA PANEL, SERUM
ALBUMIN/GLOB SERPL: 1.1 (ref 0.7–1.7)
ALPHA 1: 0.2 g/dL (ref 0.0–0.4)
Albumin SerPl Elph-Mcnc: 3.3 g/dL (ref 2.9–4.4)
Alpha2 Glob SerPl Elph-Mcnc: 0.8 g/dL (ref 0.4–1.0)
B-Globulin SerPl Elph-Mcnc: 1.2 g/dL (ref 0.7–1.3)
GAMMA GLOB SERPL ELPH-MCNC: 1.1 g/dL (ref 0.4–1.8)
GLOBULIN, TOTAL: 3.3 g/dL (ref 2.2–3.9)
IGA/IMMUNOGLOBULIN A, SERUM: 608 mg/dL — AB (ref 61–437)
IGM (IMMUNOGLOBULIN M), SRM: 56 mg/dL (ref 15–143)
IgG (Immunoglobin G), Serum: 998 mg/dL (ref 700–1600)
M Protein SerPl Elph-Mcnc: 0.3 g/dL — ABNORMAL HIGH
Total Protein: 6.6 g/dL (ref 6.0–8.5)

## 2018-07-24 LAB — B12 AND FOLATE PANEL
Folate: 9.9 ng/mL (ref >3.0)
Vitamin B-12: 242 pg/mL (ref 232–1245)

## 2018-07-24 LAB — HEAVY METALS, BLOOD
ARSENIC: 4 ug/L (ref 2–23)
LEAD, BLOOD: 1 ug/dL (ref 0–4)
MERCURY: 1.5 ug/L (ref 0.0–14.9)

## 2018-07-24 LAB — VITAMIN B6: Vitamin B6: 5.6 ug/L (ref 5.3–46.7)

## 2018-07-24 LAB — VITAMIN B1: Thiamine: 109.8 nmol/L (ref 66.5–200.0)

## 2018-07-24 LAB — METHYLMALONIC ACID, SERUM: Methylmalonic Acid: 269 nmol/L (ref 0–378)

## 2018-07-27 ENCOUNTER — Telehealth: Payer: Self-pay | Admitting: Neurology

## 2018-07-27 DIAGNOSIS — R634 Abnormal weight loss: Secondary | ICD-10-CM

## 2018-07-27 DIAGNOSIS — E519 Thiamine deficiency, unspecified: Secondary | ICD-10-CM

## 2018-07-27 DIAGNOSIS — R61 Generalized hyperhidrosis: Secondary | ICD-10-CM

## 2018-07-27 DIAGNOSIS — G3281 Cerebellar ataxia in diseases classified elsewhere: Secondary | ICD-10-CM

## 2018-07-27 DIAGNOSIS — E538 Deficiency of other specified B group vitamins: Secondary | ICD-10-CM

## 2018-07-27 DIAGNOSIS — R292 Abnormal reflex: Secondary | ICD-10-CM

## 2018-07-27 DIAGNOSIS — R5382 Chronic fatigue, unspecified: Secondary | ICD-10-CM

## 2018-07-27 DIAGNOSIS — M6281 Muscle weakness (generalized): Secondary | ICD-10-CM

## 2018-07-27 DIAGNOSIS — N183 Chronic kidney disease, stage 3 (moderate): Secondary | ICD-10-CM | POA: Diagnosis not present

## 2018-07-27 NOTE — Telephone Encounter (Signed)
I called pt and spoke to pt and his wife. I explained his lab work in great detail with them. They are agreeable to a hematology referral and understand that the hematology office will call them for this appt.. Pt verbalized understanding of results. Pt had no questions at this time but was encouraged to call back if questions arise.

## 2018-07-27 NOTE — Telephone Encounter (Signed)
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 Patient needs to be seen by Hematology. See below  In one lab we found an increase in a normal component of blood. This may not mean anything but I want him to be evaluated by hematology for MGUS. Monoclonal gammopathy of undetermined significance (MGUS) is a condition in which an abnormal protein - known as monoclonal protein or M protein - is in your blood. The protein is produced in a type of white blood cell (plasma cells) in your bone marrow. MGUS usually causes no problems. But sometimes it can progress over years to other disorders, including some forms of blood cancer. It's important to have regular checkups to closely monitor monoclonal gammopathy so that if it does progress, you get earlier treatment. If there's no disease progression, MGUS doesn't require treatment.    Otherwise all the labs I did looked normal thanks. After you talk to patient please place a referral to hematology for Immunofixation shows IgA monoclonal protein with kappa light chain  specificity.

## 2018-07-27 NOTE — Addendum Note (Signed)
Addended by: Lester [place] A on: 07/27/2018 04:35 PM   Modules accepted: Orders

## 2018-07-27 NOTE — Progress Notes (Signed)
Office Visit Note  Patient: Alex Dunn             Date of Birth: Nov 22, 1935           MRN: [id number]             PCP: Shelda Pal, DO Referring: Marletta Lor, MD Visit Date: 08/10/2018 Occupation: @GUAROCC @  Subjective:  Fatigue   History of Present Illness: Alex Dunn is a 82 y.o. male with history of positive ANA, DDD, and osteoarthritis.  Patient recently is currently taking prednisone 2.5 mg by mouth daily.  He continues to have significant fatigue and weakness.  He states that he has no myalgias or arthralgias at this time.  He denies any joint swelling.  He states that he has had diarrhea for the past 1 week.  He states he has not been evaluated by GI specialist or his PCP recently.  He states that he wakes up several times per night with diarrhea.  He denies any blood in his stool.  He is tried taking Imodium.  He reports that his last colonoscopy was 12 to 15 years ago.  Patient reports that he is evaluated by the neurologist who ordered additional labs.  He reports an abnormal protein was found in his blood and he will be following up with hematology on Wednesday.     Activities of Daily Living:  Patient reports morning stiffness for 0 minutes.   Patient Denies nocturnal pain.  Difficulty dressing/grooming: Denies Difficulty climbing stairs: Reports Difficulty getting out of chair: Reports Difficulty using hands for taps, buttons, cutlery, and/or writing: Reports  Review of Systems  Constitutional: Positive for fatigue. Negative for night sweats.  HENT: Negative for mouth sores, trouble swallowing, trouble swallowing, mouth dryness and nose dryness.   Eyes: Negative for pain, redness, visual disturbance and dryness.  Respiratory: Negative for cough, hemoptysis, shortness of breath, wheezing and difficulty breathing.   Cardiovascular: Negative for chest pain, palpitations, hypertension, irregular heartbeat and swelling in legs/feet.    Gastrointestinal: Positive for diarrhea. Negative for abdominal pain, constipation, nausea and vomiting.  Endocrine: Negative for increased urination.  Genitourinary: Negative for painful urination and pelvic pain.  Musculoskeletal: Negative for arthralgias, joint pain, joint swelling, myalgias, muscle weakness, morning stiffness, muscle tenderness and myalgias.  Skin: Positive for rash. Negative for color change, hair loss, nodules/bumps, skin tightness, ulcers and sensitivity to sunlight.  Allergic/Immunologic: Negative for susceptible to infections.  Neurological: Positive for memory loss and weakness. Negative for dizziness, fainting, light-headedness, headaches and night sweats.  Hematological: Negative for swollen glands.  Psychiatric/Behavioral: Negative for depressed mood, confusion and sleep disturbance. The patient is not nervous/anxious.     PMFS History:  Patient Active Problem List   Diagnosis Date Noted  . Primary osteoarthritis of both knees 06/30/2018  . DDD (degenerative disc disease), cervical 06/30/2018  . Essential hypertension 04/15/2018  . Diverticulosis 04/15/2018  . Abdominal aortic atherosclerosis ([place]) 04/15/2018  . Elevated hemidiaphragm 04/15/2018  . Polycystic kidney disease 04/06/2018  . Night sweats 03/13/2018  . Fatigue 03/13/2018  . Chronic kidney disease 10/18/2016  . ANEMIA-IRON DEFICIENCY 06/23/2007  . GERD 06/23/2007  . BPH (benign prostatic hyperplasia) 06/23/2007    Past Medical History:  Diagnosis Date  . ANEMIA-IRON DEFICIENCY 06/23/2007  . BENIGN PROSTATIC HYPERTROPHY 06/23/2007  . Diverticulosis   . GERD 06/23/2007  . Hypertension   . Polycystic kidney disease    stage 3 per wife    Family History  Problem Relation  Age of Onset  . Heart attack Mother   . Heart attack Father   . Heart attack Brother        "leaky valve"  . Neuromuscular disorder Neg Hx    Past Surgical History:  Procedure Laterality Date  . HAND SURGERY    .  LUNG SURGERY     for collapsed lung   Social History   Social History Narrative   Lives at home with his wife   Retired   Right handed   Caffeine: about 2 cups daily    Objective: Vital Signs: BP (!) 146/76 (BP Location: Left Arm, Patient Position: Sitting, Cuff Size: Normal)   Pulse 60   Resp 13   Ht 6' (1.829 m)   Wt 187 lb 9.6 oz (85.1 kg)   BMI 25.44 kg/m    Physical Exam  Constitutional: He is oriented to person, place, and time. He appears well-developed and well-nourished.  HENT:  Head: Normocephalic and atraumatic.  Eyes: Pupils are equal, round, and reactive to light. Conjunctivae and EOM are normal.  Neck: Normal range of motion. Neck supple.  Cardiovascular: Normal rate, regular rhythm and normal heart sounds.  Pulmonary/Chest: Effort normal and breath sounds normal.  Abdominal: Soft. Bowel sounds are normal.  Lymphadenopathy:    He has no cervical adenopathy.  Neurological: He is alert and oriented to person, place, and time.  Skin: Skin is warm and dry. Capillary refill takes less than 2 seconds.  Psychiatric: He has a normal mood and affect. His behavior is normal.  Nursing note and vitals reviewed.    Musculoskeletal Exam: C-spine, thoracic spine, and lumbar spine good ROM.  No midline spinal tenderness.  No SI joint tenderness.  Shoulder joints, elbow joints, wrist joints, MCPs, PIPs, and DIPs good ROM with no synovitis.  Hip joints, knee joints, ankle joints, MTPs, PIPs, and DIPs good ROM with no synovitis.  No warmth or effusion of knee joints.  No tenderness or swelling of ankle joints.  No tenderness of trochanteric bursa bilaterally.   CDAI Exam: CDAI Score: Not documented Patient Global Assessment: Not documented; Provider Global Assessment: Not documented Swollen: Not documented; Tender: Not documented Joint Exam   Not documented   There is currently no information documented on the homunculus. Go to the Rheumatology activity and complete the  homunculus joint exam.  Investigation: No additional findings.  Imaging: Mr Jeri Cos CB Contrast  Result Date: 07/29/2018  [facility] [address] [place], [place] [address] [phone] NEUROIMAGING REPORT STUDY DATE: 07/28/2018 PATIENT NAME: Alex Dunn DOB: [birth year] MRN: [id number] EXAM: MRI Brain with and without contrast ORDERING CLINICIAN: Sarina Ill, MD CLINICAL HISTORY: 82 year old man with gait disturbance COMPARISON FILMS: None TECHNIQUE:MRI of the brain with and without contrast was obtained utilizing 5 mm axial slices with T1, T2, T2 flair, SWI and diffusion weighted views.  T1 sagittal, T2 coronal and postcontrast views in the axial and coronal plane were obtained. CONTRAST: 17 ml [employer] IMAGING SITE: [facility], [place]. FINDINGS: On sagittal images, the spinal cord is imaged caudally to C3-C4 and is normal in caliber.   The contents of the posterior fossa are of normal size and position.   The pituitary gland and optic chiasm appear normal.   There is mild generalized cortical atrophy.  There are no abnormal extra-axial collections of fluid.  The cerebellum and brainstem appears normal.   The deep gray matter appears normal.  There are T2/FLAIR hyperintense foci  in the periventricular and deep white matter of the hemispheres consistent with mild chronic microvascular ischemic change.  None of these appear to be acute..   Diffusion weighted images are normal.  Susceptibility weighted images are normal.   The orbits appear normal.   The VIIth/VIIIth nerve complex appears normal.  The mastoid air cells appear normal.  The paranasal sinuses appear normal.  Flow voids are identified within the major intracerebral arteries.  Incidental note is made of dolichoectasia of the vertebrobasilar arteries and the cavernous segments of the internal carotid arteries.  After the infusion of contrast material, a normal enhancement pattern is noted.    This MRI of the brain with and without contrast shows the following: 1.     Mild generalized cortical atrophy. 2.     T2/FLAIR hyperintense foci of the hemispheres consistent with mild chronic microvascular ischemic changes 3.     Vertebrobasilar and internal carotid artery dolichoectasia. 4.     There are no acute findings and there is a normal enhancement pattern. INTERPRETING PHYSICIAN: Richard A. Felecia Shelling, MD, PhD, FAAN Certified in  Neuroimaging by [employer]   Mr Cervical Spine Wo Contrast  Result Date: 07/29/2018  [facility] [address] [place], [place] [address] [phone] NEUROIMAGING REPORT STUDY DATE: 07/29/2018 PATIENT NAME: Alex Dunn DOB: 01/29/1936 MRN: [id number] EXAM: MRI of the cervical spine ORDERING CLINICIAN: Sarina Ill, MD CLINICAL HISTORY: 82 year old man with gait disturbance and hyperreflexia COMPARISON FILMS: None TECHNIQUE: MRI of the cervical spine was obtained utilizing 3 mm sagittal slices from the posterior fossa down to the T3-4 level with T1, T2 and inversion recovery views. In addition 4 mm axial slices from U1-3 down to T1-2 level were included with T2 and gradient echo views. CONTRAST: None IMAGING SITE: [facility], [place], [place], [place] FINDINGS: :  On sagittal images, the spine is imaged from above the cervicomedullary junction to T2.  Vertebrobasilar dolichoectasia is noted.  The spinal cord is of normal caliber and signal.   There is 2 mm of anterolisthesis of C7 upon T1.  There are chronic endplate fractures of the C3-C6 vertebral bodies, likely osteoporotic..   The vertebral bodies have normal signal.  The discs and interspaces were further evaluated on axial views from C2 to T1 as follows: C2-C3: There is mild right facet hypertrophy.  There is no foraminal narrowing or spinal stenosis.  There is no nerve root compression. C3-C4: The central canal is narrowed but not enough to be considered  spinal stenosis due to disc extrusion, uncovertebral spurring, facet hypertrophy and ligamentum flavum hypertrophy.  There is mild left and mild to moderate right foraminal narrowing but there does not appear to be nerve root compression. C4-C5: There is disc bulging, facet hypertrophy and uncovertebral spurring causing mild to moderate bilateral foraminal narrowing but no nerve root compression. C5-C6: There is mild spinal stenosis due to combination of disc protrusion, uncovertebral spurring and facet hypertrophy.  There is mild to moderate bilateral foraminal narrowing but no nerve root compression. C6-C7: There is disc bulging causing mild foraminal narrowing but no nerve root compression. C7-T1: There is 2 mm of anterolisthesis associated with facet hypertrophy and disc bulging.  There does not appear to be spinal stenosis or nerve root compression.   This MRI of the cervical spine without contrast shows the following: 1.     The spinal cord appears normal. 2.     At C3-C4, there is borderline spinal stenosis  due to degenerative changes also causing mild to moderate foraminal narrowing.  There is no nerve root compression. 3.     At C4-C5, there is mild spinal stenosis due to degenerative changes also causing mild to moderate foraminal narrowing.  There is no nerve root compression. 4.     At C7-T1, there is minimal anterolisthesis, facet hypertrophy and disc bulging.  There is no spinal stenosis or nerve root compression. 5.     There are milder degenerative changes at the other cervical levels as detailed above that do not lead to spinal stenosis or nerve root compression. 6.     There are endplate deformations involving the C3-C6 vertebral bodies most consistent with chronic osteoporotic endplate fractures. 7.     There are no acute findings. INTERPRETING PHYSICIAN: Richard A. Felecia Shelling, MD, PhD, FAAN Certified in  Neuroimaging by [employer]    Recent Labs: Lab Results  Component  Value Date   WBC 6.1 05/03/2018   HGB 14.0 05/03/2018   PLT 167 05/03/2018   NA 142 07/20/2018   K 4.7 07/20/2018   CL 104 07/20/2018   CO2 25 07/20/2018   GLUCOSE 89 07/20/2018   BUN 22 07/20/2018   CREATININE 1.43 (H) 07/20/2018   BILITOT 0.5 05/03/2018   ALKPHOS 66 05/03/2018   AST 16 05/03/2018   ALT 14 05/03/2018   PROT 6.6 07/20/2018   ALBUMIN 3.3 (L) 05/03/2018   CALCIUM 9.2 07/20/2018   GFRAA 53 (L) 07/20/2018   QFTBGOLDPLUS NEGATIVE 06/15/2018    Speciality Comments: No specialty comments available.  Procedures:  No procedures performed Allergies: Codeine phosphate and Lexapro [escitalopram oxalate]   Assessment / Plan:     Visit Diagnoses: Positive ANA (antinuclear antibody) - ANA 1:80 Homogeneous, ENA -, beta 2 negative, RF -, Complements WNL, anticardiolipin antibody negative, AVISE score -0.2: He has no clinical features of autoimmune disease at this time.  He denies any myalgias or arthralgias at this time.  He has chronic fatigue.  He was evaluated by neurology who did not feel that he had signs or symptoms of myasthenia gravis.  He will be following up with hematology for abnormal monoclonal protein panel.  He was advised to notify us if he develops any new or worsening symptoms.  He will follow-up with [place] as needed.  Chronic fatigue -he continues to have chronic fatigue.  He has not been sleeping well at night due to waking up several times a night to have diarrhea.  Referral was placed to GI today.  He will be following with hematology on Wednesday for evaluation of abnormal monoclonal protein panel.  Plan: Ambulatory referral to Gastroenterology  Primary osteoarthritis of both knees - Bilateral moderate with moderate chondromalacia patella: No warmth or effusion.  Good range of motion no discomfort.  DDD (degenerative disc disease), cervical: Good range of motion no discomfort.  No cervical radiculopathy at this time  History of diarrhea -Patient has had  diarrhea for the past 1 week.  He has not had any blood in his stool.  He has no abdominal pain or tenderness on exam today.  He has been waking up several times per night with diarrhea.  He has not been around any sick contacts.  The origin of the diarrhea is unknown.  He has tried taking Imodium several times.  He reports his last colonoscopy was 12 to 15 years ago.  He does not see a GI specialist.  Referral to GI will be placed today for  further evaluation. Plan: Ambulatory referral to Gastroenterology   Stage 3 chronic kidney disease [facility]) - He has been followed up by nephrology.  He is scheduled for an ultrasound.   Other medical conditions are listed as follows:   Diverticulosis  Essential hypertension  Gastroesophageal reflux disease without esophagitis  Benign prostatic hyperplasia with urinary obstruction    Orders: Orders Placed This Encounter  Procedures  . Ambulatory referral to Gastroenterology   No orders of the defined types were placed in this encounter.  Face-to-face time spent patient was 30 minutes.  Greater than 50% time was spent in counseling and coordination of care.   Follow-Up Instructions: Return if symptoms worsen or fail to improve, for Positive ANA, Osteoarthritis, DDD.   Ofilia Neas, PA-C   I examined and evaluated the patient with Hazel Sams PA.  I had detailed discussion with patient regarding the [facility] labs.  Based on his symptoms and the labs he does not have autoimmune disease.  I am uncertain about the etiology of his fatigue.  He had good muscle strength on my examination.  He had no joint swelling.  He is still taking prednisone 2.5 mg p.o. daily.  I have advised him to taper off prednisone.  He has been having diarrhea for several days now.  He has not had a colonoscopy in 12 years.  We will arrange a GI referral.  He also has an appointment coming up with hematology for evaluation of abnormal SPEP.  We will follow-up with his PCP.  The plan  of care was discussed as noted above.  Bo Merino, MD Note - This record has been created using Editor, commissioning.  Chart creation errors have been sought, but may not always  have been located. Such creation errors do not reflect on  the standard of medical care.

## 2018-07-28 ENCOUNTER — Ambulatory Visit
Admission: RE | Admit: 2018-07-28 | Discharge: 2018-07-28 | Disposition: A | Discharge status: Home / Self Care | Payer: Medicare Other | Source: Ambulatory Visit | Attending: Neurology | Admitting: Neurology

## 2018-07-28 DIAGNOSIS — G3281 Cerebellar ataxia in diseases classified elsewhere: Secondary | ICD-10-CM

## 2018-07-28 DIAGNOSIS — M6281 Muscle weakness (generalized): Secondary | ICD-10-CM | POA: Diagnosis not present

## 2018-07-28 DIAGNOSIS — R634 Abnormal weight loss: Secondary | ICD-10-CM

## 2018-07-28 DIAGNOSIS — R292 Abnormal reflex: Secondary | ICD-10-CM | POA: Diagnosis not present

## 2018-07-28 MED ORDER — GADOBENATE DIMEGLUMINE 529 MG/ML IV SOLN
17.0000 mL | Freq: Once | INTRAVENOUS | Status: AC | PRN
  Administered 2018-07-28: 17 mL via INTRAVENOUS

## 2018-08-04 ENCOUNTER — Telehealth: Payer: Self-pay | Admitting: Hematology and Oncology

## 2018-08-04 ENCOUNTER — Encounter: Payer: Self-pay | Admitting: Hematology and Oncology

## 2018-08-04 NOTE — Telephone Encounter (Signed)
Spoke to Alex Dunn and has scheduled the pt to see Dr. Audelia Hives on 10/9 at 1pm. Aware to arrive 30 minutes early. Letter mailed.

## 2018-08-05 DIAGNOSIS — N183 Chronic kidney disease, stage 3 (moderate): Secondary | ICD-10-CM | POA: Diagnosis not present

## 2018-08-05 DIAGNOSIS — I129 Hypertensive chronic kidney disease with stage 1 through stage 4 chronic kidney disease, or unspecified chronic kidney disease: Secondary | ICD-10-CM | POA: Diagnosis not present

## 2018-08-05 DIAGNOSIS — Q613 Polycystic kidney, unspecified: Secondary | ICD-10-CM | POA: Diagnosis not present

## 2018-08-10 ENCOUNTER — Other Ambulatory Visit: Payer: Self-pay | Admitting: Nephrology

## 2018-08-10 ENCOUNTER — Ambulatory Visit: Payer: Medicare Other | Admitting: Rheumatology

## 2018-08-10 ENCOUNTER — Ambulatory Visit
Admission: RE | Admit: 2018-08-10 | Discharge: 2018-08-10 | Disposition: A | Discharge status: Home / Self Care | Payer: Medicare Other | Source: Ambulatory Visit | Attending: Nephrology | Admitting: Nephrology

## 2018-08-10 ENCOUNTER — Encounter: Payer: Self-pay | Admitting: Rheumatology

## 2018-08-10 VITALS — BP 146/76 | HR 60 | Resp 13 | Ht 72.0 in | Wt 187.6 lb

## 2018-08-10 DIAGNOSIS — N183 Chronic kidney disease, stage 3 unspecified: Secondary | ICD-10-CM

## 2018-08-10 DIAGNOSIS — R5382 Chronic fatigue, unspecified: Secondary | ICD-10-CM | POA: Diagnosis not present

## 2018-08-10 DIAGNOSIS — Q613 Polycystic kidney, unspecified: Secondary | ICD-10-CM

## 2018-08-10 DIAGNOSIS — M170 Bilateral primary osteoarthritis of knee: Secondary | ICD-10-CM | POA: Diagnosis not present

## 2018-08-10 DIAGNOSIS — R768 Other specified abnormal immunological findings in serum: Secondary | ICD-10-CM | POA: Diagnosis not present

## 2018-08-10 DIAGNOSIS — N138 Other obstructive and reflux uropathy: Secondary | ICD-10-CM

## 2018-08-10 DIAGNOSIS — N401 Enlarged prostate with lower urinary tract symptoms: Secondary | ICD-10-CM

## 2018-08-10 DIAGNOSIS — Z87898 Personal history of other specified conditions: Secondary | ICD-10-CM

## 2018-08-10 DIAGNOSIS — I129 Hypertensive chronic kidney disease with stage 1 through stage 4 chronic kidney disease, or unspecified chronic kidney disease: Secondary | ICD-10-CM

## 2018-08-10 DIAGNOSIS — R7689 Other specified abnormal immunological findings in serum: Secondary | ICD-10-CM

## 2018-08-10 DIAGNOSIS — M5030 Other cervical disc degeneration, unspecified cervical region: Secondary | ICD-10-CM

## 2018-08-10 DIAGNOSIS — N189 Chronic kidney disease, unspecified: Secondary | ICD-10-CM | POA: Diagnosis not present

## 2018-08-10 DIAGNOSIS — K219 Gastro-esophageal reflux disease without esophagitis: Secondary | ICD-10-CM

## 2018-08-10 DIAGNOSIS — K5790 Diverticulosis of intestine, part unspecified, without perforation or abscess without bleeding: Secondary | ICD-10-CM

## 2018-08-10 DIAGNOSIS — I10 Essential (primary) hypertension: Secondary | ICD-10-CM

## 2018-08-12 ENCOUNTER — Inpatient Hospital Stay: Payer: Medicare Other | Attending: Hematology and Oncology | Admitting: Hematology and Oncology

## 2018-08-12 ENCOUNTER — Inpatient Hospital Stay: Payer: Medicare Other

## 2018-08-12 ENCOUNTER — Encounter: Payer: Self-pay | Admitting: Hematology and Oncology

## 2018-08-12 ENCOUNTER — Telehealth: Payer: Self-pay | Admitting: Hematology and Oncology

## 2018-08-12 VITALS — BP 148/78 | HR 65 | Temp 98.4°F | Resp 17 | Ht 72.0 in | Wt 189.4 lb

## 2018-08-12 DIAGNOSIS — D472 Monoclonal gammopathy: Secondary | ICD-10-CM

## 2018-08-12 DIAGNOSIS — E559 Vitamin D deficiency, unspecified: Secondary | ICD-10-CM

## 2018-08-12 DIAGNOSIS — N183 Chronic kidney disease, stage 3 unspecified: Secondary | ICD-10-CM

## 2018-08-12 DIAGNOSIS — I129 Hypertensive chronic kidney disease with stage 1 through stage 4 chronic kidney disease, or unspecified chronic kidney disease: Secondary | ICD-10-CM | POA: Diagnosis not present

## 2018-08-12 DIAGNOSIS — N401 Enlarged prostate with lower urinary tract symptoms: Secondary | ICD-10-CM | POA: Diagnosis not present

## 2018-08-12 DIAGNOSIS — N189 Chronic kidney disease, unspecified: Secondary | ICD-10-CM | POA: Insufficient documentation

## 2018-08-12 DIAGNOSIS — Z79899 Other long term (current) drug therapy: Secondary | ICD-10-CM | POA: Insufficient documentation

## 2018-08-12 LAB — CBC WITH DIFFERENTIAL (CANCER CENTER ONLY)
Abs Immature Granulocytes: 0.04 10*3/uL (ref 0.00–0.07)
BASOS PCT: 0 %
Basophils Absolute: 0 10*3/uL (ref 0.0–0.1)
EOS ABS: 0 10*3/uL (ref 0.0–0.5)
EOS PCT: 1 %
HCT: 42.8 % (ref 39.0–52.0)
Hemoglobin: 14 g/dL (ref 13.0–17.0)
Immature Granulocytes: 1 %
Lymphocytes Relative: 20 %
Lymphs Abs: 1.5 10*3/uL (ref 0.7–4.0)
MCH: 31.6 pg (ref 26.0–34.0)
MCHC: 32.7 g/dL (ref 30.0–36.0)
MCV: 96.6 fL (ref 80.0–100.0)
MONOS PCT: 7 %
Monocytes Absolute: 0.5 10*3/uL (ref 0.1–1.0)
Neutro Abs: 5.2 10*3/uL (ref 1.7–7.7)
Neutrophils Relative %: 71 %
PLATELETS: 168 10*3/uL (ref 150–400)
RBC: 4.43 MIL/uL (ref 4.22–5.81)
RDW: 12.7 % (ref 11.5–15.5)
WBC Count: 7.3 10*3/uL (ref 4.0–10.5)
nRBC: 0 % (ref 0.0–0.2)

## 2018-08-12 LAB — COMPREHENSIVE METABOLIC PANEL
ALBUMIN: 3.6 g/dL (ref 3.5–5.0)
ALT: 13 U/L (ref 0–44)
ANION GAP: 8 (ref 5–15)
AST: 15 U/L (ref 15–41)
Alkaline Phosphatase: 75 U/L (ref 38–126)
BILIRUBIN TOTAL: 0.4 mg/dL (ref 0.3–1.2)
BUN: 20 mg/dL (ref 8–23)
CO2: 27 mmol/L (ref 22–32)
Calcium: 9 mg/dL (ref 8.9–10.3)
Chloride: 106 mmol/L (ref 98–111)
Creatinine, Ser: 1.38 mg/dL — ABNORMAL HIGH (ref 0.61–1.24)
GFR, EST AFRICAN AMERICAN: 54 mL/min — AB (ref >60)
GFR, EST NON AFRICAN AMERICAN: 47 mL/min — AB (ref >60)
GLUCOSE: 96 mg/dL (ref 70–99)
POTASSIUM: 4.3 mmol/L (ref 3.5–5.1)
Sodium: 141 mmol/L (ref 135–145)
TOTAL PROTEIN: 6.9 g/dL (ref 6.5–8.1)

## 2018-08-12 LAB — RETICULOCYTES
IMMATURE RETIC FRACT: 7 % (ref 2.3–15.9)
RBC.: 4.43 MIL/uL (ref 4.22–5.81)
RETIC COUNT ABSOLUTE: 33.7 10*3/uL (ref 19.0–186.0)
RETIC CT PCT: 0.8 % (ref 0.4–3.1)

## 2018-08-12 LAB — LACTATE DEHYDROGENASE: LDH: 136 U/L (ref 98–192)

## 2018-08-12 NOTE — Progress Notes (Signed)
[facility] Initial Consultation  Patient Name:  Alex Dunn  DOB: Sep 17, 1936   Date of Service: August 12, 2018  Referring Provider: Marletta Lor, [place] [place], [place] [address]   Consulting Physician: Henreitta Leber, MD Hematology/Oncology  Patient Care Team: Shelda Pal, DO  Reason for Referral: In the setting of a monoclonal IgA kappa paraproteinemia, Alex Dunn presents now for further diagnostic and therapeutic recommendations.  History Present Illness: Alex Dunn is an 82 year old resident of [place] whose past medical history is significant for benign prostatic hypertrophy; diverticular disease; vitamin D deficiency on replacement; primary hypertension; positive double-stranded DNA (ANA titer elevated) on prednisone; a large right kidney cyst and smaller bilateral renal cysts; and chronic kidney disease.  Alex Dunn is a lifetime nondrinker and non-smoker.  Alex Dunn is accompanied by his attentive wife, Alex Dunn.  His primary care physician is Dr.Nicholas Wendling.  Beginning in March 2019, Alex Dunn developed drenching night sweats on a once weekly basis, generalized myalgias with extreme fatigue.  Alex Dunn was seen and evaluated at the emergency department in [place] where a viral syndrome was suspected.  Alex Dunn denied any cough, fever or gastrointestinal complaints.  Although his energy level never improved, the night sweats dissipated and Alex Dunn again was seen and evaluated in the emergency department in May complaining of shakiness, night sweats, generalized myalgias, and fatigue.  Over the past 4 months his appetite has declined and Alex Dunn has lost 20 pounds.  No skin lesions were identified.  There was no itching..  On May 03, 2018 through his primary care physician, CT imaging of the abdomen and pelvis wwas performed.  There was no acute process within the abdomen or pelvis.  Bilateral renal cyst however were noted measuring up  to 7 cm within the superior pole of the right kidney.  Additional smaller cyst were seen within the left and right kidneys.  Those detailed results are outlined below.   Subsequently, Alex Dunn was seen and evaluated by a number of subspecialists including Dr. Bo Merino, rheumatology.  On July 3, a lengthy immunologic profile was requested. Those results are detailed below.  His ANA titer was relatively low, while his double-stranded DNA was positive.  Alex Dunn was started on prednisone 10 mg  It was subsequently tapered to prednisone: 2.5 mg once daily.  Alex Dunn continues on prednisone to the present.  Alex Dunn was referred thereafter to Janene Madeira, infectious disease nurse practitioner.  There was no evidence of active infection.  Alex Dunn was seen also by the nephrology service.  Those records are not available on the existing chart.  Alex Dunn was referred also to Dr. Sarina Ill, neurology, when a monoclonal IgA kappa chain paraprotein was identified in the blood.  Quantitative light chains and Bence-Jones proteins were not evaluated.  On May 03, 2018: A complete blood count showed hemoglobin 14.0 hematocrit 42.1 MCV 92.2 MCH 31.7 RDW 13.3 WBC 6.1 with 67% neutrophils 23% lymphocytes 9% monocytes 1% eosinophil; platelets 167,000.  On the same day, a comprehensive metabolic panel showed sodium 142 potassium 4.4 chloride 107 CO2 29 glucose 103 BUN 21 creatinine 1.5 total protein 6.2 calcium 9.1 albumin 3.3 SGOT 16 SGPT 14 alkaline phosphatase 66 total bilirubin 0.5.  Only 2 weeks ago on July 14, 2018: A comprehensive metabolic panel showed sodium 142 potassium 4.7 chloride 104 CO2 25 glucose 89 BUN 22 creatinine 1.43 calcium 9.2.  A serum protein electrophoresis and immunofixation was performed on September 16.  The serum protein electrophoresis revealed a  normal electrophoresis with an IgG kappa light chain specific paraprotein by immunofixation.  Quantitative immunoglobulins: IgG 998 ([phone]); IgA 608 [phone]); IgM 56  (15-143).  A free kappa/lambda light chain analysis was not performed.  Likewise there was no 24-hour urine protein electrophoresis and immunofixation.  Alex Dunn has no diabetes mellitus, primary hypertension, coronary artery disease, or cardiac dysrhythmia.  Alex Dunn reports no seizure disorder or stroke syndrome.  There is no thyroid disease.  Alex Dunn denies peptic ulcer or gastroesophageal reflux disease.  Alex Dunn has no viral hepatitis, inflammatory bowel disease, or symptomatic diverticulosis.  Alex Dunn has benign prostatic hypertrophy and 2-3 time nocturia overnight for the past 5 years.  Alex Dunn has no rheumatoid or gouty arthritis.  Alex Dunn reports no peripheral arterial venous thromboembolic disease.  Alex Dunn admits to degenerative joint disease and pain in the knees and minimally neck..  In addition to a 20 pound weight loss, now improved over the past several weeks with prednisone, Alex Dunn denies any visual changes or hearing deficit.  Alex Dunn denies any headache, dizziness, lightheadedness, syncope, near syncopal episodes.  There is no unusual cough, sore throat, or orthopnea.  Alex Dunn has no dyspnea at rest.  Alex Dunn is chronic but stable exertional dyspnea unchanged over his baseline.  Alex Dunn continues to complain of significant fatigue.  There is no pain or difficulty in swallowing.    No current fever, shaking chills, sweats, or flulike symptoms are evident.  The drenching night sweats have resolved completely.  Alex Dunn has no heartburn or indigestion.  There is no nausea, vomiting, diarrhea, or constipation.  Alex Dunn generally moves his bowels once daily or every other day.  Alex Dunn denies melena or bright red blood per rectum.  There is no urinary frequency, urgency, hematuria, or dysuria.  There are no new focal areas of bone, joint, muscle pain.  Alex Dunn reports no bleeding tendency or easy bruisability.  Alex Dunn has no numbness or tingling in the fingers or toes..  It is with this background Alex Dunn presents now for further diagnostic and therapeutic recommendations in the setting  of an IgG kappa paraprotein as outlined above.  Past Medical History:  Diagnosis Date  . ANEMIA-IRON DEFICIENCY 06/23/2007  . BENIGN PROSTATIC HYPERTROPHY 06/23/2007  . Diverticulosis   . GERD 06/23/2007  . Hypertension   . Polycystic kidney disease    stage 3 per wife   Past Surgical History:  Procedure Laterality Date  . HAND SURGERY    . LUNG SURGERY     for collapsed lung   Family History  Problem Relation Age of Onset  . Heart attack Mother   . Heart attack Father   . Heart attack Brother        "leaky valve"  . Neuromuscular disorder Neg Hx   Mother: Deceased: Age 36 years: "old age" Father: Deceased: Age 63 years: [place] Brothers (1): Deceased: Age 26 years: MI Alex Dunn has 1 sister without major medical problems. There is no history of malignancy or blood disorders in the immediate family.  Social History   Socioeconomic History  . Marital status: Married    Spouse name: Not on file  . Number of children: 1  . Years of education: Not on file  . Highest education level: High school graduate  Occupational History  . Not on file  Social Needs  . Financial resource strain: Not on file  . Food insecurity:    Worry: Not on file    Inability: Not on file  . Transportation needs:    Medical: Not on file  Non-medical: Not on file  Tobacco Use  . Smoking status: Never Smoker  . Smokeless tobacco: Never Used  Substance and Sexual Activity  . Alcohol use: Never    Frequency: Never  . Drug use: Never  . Sexual activity: Not on file  Lifestyle  . Physical activity:    Days per week: Not on file    Minutes per session: Not on file  . Stress: Not on file  Relationships  . Social connections:    Talks on phone: Not on file    Gets together: Not on file    Attends religious service: Not on file    Active member of club or organization: Not on file    Attends meetings of clubs or organizations: Not on file    Relationship status: Not on file  . Intimate partner  violence:    Fear of current or ex partner: Not on file    Emotionally abused: Not on file    Physically abused: Not on file    Forced sexual activity: Not on file  Other Topics Concern  . 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 Alex Dunn has been married for 42 years. Alex Dunn has 1 daughter age 22 years with endometriosis. Alex Dunn is a lifetime nondrinker non-smoker. No significant use of pipe, cigars, or chewing tobacco. Alex Dunn reports no recreational drug use.  Transfusion History: No prior transfusion  Exposure History: Alex Dunn was exposed to asbestos while in the [employer] from 1950 05-1960.  Alex Dunn has no other known exposures to toxic chemicals, radiation, or pesticides.  Allergies  Allergen Reactions  . Codeine Phosphate Other (See Comments)    Seen odd things crawling up wall  . Lexapro [Escitalopram Oxalate] Other (See Comments)    shaking   Current Outpatient Medications on File Prior to Visit  Medication Sig  . pantoprazole (PROTONIX) 40 MG tablet Take 1 tablet (40 mg total) by mouth daily as needed. (Patient taking differently: Take 40 mg by mouth daily as needed (heartburn). )  . predniSONE (DELTASONE) 5 MG tablet Take 2.5 mg by mouth daily with breakfast.    No current facility-administered medications on file prior to visit.    Review of Systems: Constitutional: No fever; previous drenching sweats, now dissipated; no shaking chills.  Appetite decline and 20 pound weight loss over the past 4 months, now improving; energy level low. Skin: No rash, scaling, sores, lumps, or jaundice. HEENT: No visual changes or hearing deficit; Alex Dunn wears corrective lens. Pulmonary: No unusual cough, sore throat, or orthopnea; no allergic rhinitis or chronic sinusitis. Cardiovascular: No coronary artery disease, angina, or myocardial infarction.  No cardiac dysrhythmia; essential  hypertension.No dyslipidemia. Gastrointestinal: No indigestion, dysphagia, abdominal pain, diarrhea, or constipation.  No change in bowel habits.  No nausea or vomiting.  No melena or bright red blood per rectum. Genitourinary: No frequency, urgency, hematuria, or dysuria; nocturia 3 times nightly; benign prostatic hypertrophy. Musculoskeletal: Degenerative joint disease involving the knees and neck.  No new arthralgias or myalgias; no joint swelling, pain, or instability. Hematologic: No bleeding tendency or easy bruisability; IgA kappa light chain paraproteinemia. Endocrine: No intolerance to heat or cold; no thyroid disease or diabetes mellitus. Vascular: No peripheral arterial or venous thromboembolic disease. Psychological: No anxiety, depression, or mood changes; no mental health illnesses. Neurological: No dizziness, lightheadedness, syncope, or  near syncopal episodes; no numbness or tingling in the fingers or toes.  No seizure disorder or stroke syndrome.  Physical Examination: Vital Signs: Body surface area is 2.09 meters squared.  Vitals:   08/12/18 1310  BP: (!) 148/78  Pulse: 65  Resp: 17  Temp: 98.4 F (36.9 C)  SpO2: 100%    Filed Weights   08/12/18 1310  Weight: 189 lb 6.4 oz (85.9 kg)   ECOG PERFORMANCE STATUS: 1 Constitutional:  Alex Dunn is fully nourished and developed.  Alex Dunn looks age appropriate.  Alex Dunn is friendly and cooperative without respiratory compromise at rest. Skin: No rashes, scaling, dryness, jaundice, or itching. HEENT: Head is normocephalic and atraumatic.  Pupils are equal round and reactive to light and accommodation.  Sclerae are anicteric.  Conjunctivae are pink.  No sinus tenderness nor oropharyngeal lesions.  Lips without cracking or peeling; tongue without mass, inflammation, or nodularity.  Mucous membranes are moist. Neck: Supple and symmetric.  No jugular venous distention or thyromegaly.  Trachea is midline. Lymphatics: No cervical or  supraclavicular lymphadenopathy.  No epitrochlear, axillary, or inguinal lymphadenopathy is appreciated. Respiratory/chest: Thorax is symmetrical.  Breath sounds are clear to auscultation and percussion.  Normal excursion and respiratory effort. Back: Symmetric without deformity or tenderness. Cardiovascular: Heart rate and rhythm are regular without murmurs. Gastrointestinal: Abdomen is soft, nontender; no organomegaly.  Bowel sounds are normoactive.  No masses are appreciated. Genitourinary: Normal external male genitalia. Rectal examination: Not performed. Extremities: In the lower extremities, there is no asymmetric swelling, erythema, tenderness, or cord formation.  No clubbing, cyanosis, nor edema. Hematologic: No petechiae, hematomas, or ecchymoses. Psychological:  Alex Dunn is oriented to person, place, and time; normal affect, memory, and cognition. Neurological: There are no gross neurologic deficits.  Laboratory Results: I have reviewed the data as listed: August 12, 2018  Ref Range & Units 15:11  WBC Count 4.0 - 10.5 K/uL 7.3   RBC 4.22 - 5.81 MIL/uL 4.43   Hemoglobin 13.0 - 17.0 g/dL 14.0   HCT 39.0 - 52.0 % 42.8   MCV 80.0 - 100.0 fL 96.6   MCH 26.0 - 34.0 pg 31.6   MCHC 30.0 - 36.0 g/dL 32.7   RDW 11.5 - 15.5 % 12.7   Platelet Count 150 - 400 K/uL 168   nRBC 0.0 - 0.2 % 0.0   Neutrophils Relative % % 71   Neutro Abs 1.7 - 7.7 K/uL 5.2   Lymphocytes Relative % 20   Lymphs Abs 0.7 - 4.0 K/uL 1.5   Monocytes Relative % 7   Monocytes Absolute 0.1 - 1.0 K/uL 0.5   Eosinophils Relative % 1   Eosinophils Absolute 0.0 - 0.5 K/uL 0.0   Basophils Relative % 0   Basophils Absolute 0.0 - 0.1 K/uL 0.0   Immature Granulocytes % 1   Abs Immature Granulocytes 0.00 - 0.07 K/uL 0.04   Reticulocyte count 0.8%  Ref Range & Units 15:11  Sodium 135 - 145 mmol/L 141   Potassium 3.5 - 5.1 mmol/L 4.3   Chloride 98 - 111 mmol/L 106   CO2 22 - 32 mmol/L 27   Glucose, Bld 70 - 99 mg/dL 96    BUN 8 - 23 mg/dL 20   Creatinine, Ser 0.61 - 1.24 mg/dL 1.38High    Calcium 8.9 - 10.3 mg/dL 9.0   Total Protein 6.5 - 8.1 g/dL 6.9   Albumin 3.5 - 5.0 g/dL 3.6   AST 15 - 41 U/L 15   ALT 0 -  44 U/L 13   Alkaline Phosphatase 38 - 126 U/L 75   Total Bilirubin 0.3 - 1.2 mg/dL 0.4   GFR calc non Af Amer >60 mL/min 47Low    GFR calc Af Amer >60 mL/min 54Low    Comment: (NOTE)  The eGFR has been calculated using the CKD EPI equation.  This calculation has not been validated in all clinical situations.  eGFR's persistently <60 mL/min signify possible Chronic Kidney  Disease.   Anion gap 5 - 15 8    Component     Latest Ref Rng & Units 05/06/2018  EBV VCA IgM     U/mL <36.00  EBV VCA IgG     U/mL 119.00 (H)  EBV NA IgG     U/mL 275.00 (H)  Interpretation        Hemoglobin A1C     <5.7 % of total Hgb 5.6  Mean Plasma Glucose     (calc) 114  eAG (mmol/L)     (calc) 6.3  Hepatitis B Surface Ag     NON-REACTI NON-REACTIVE  C206 ACTH     6 - 50 pg/mL 13  Cortisol - AM     mcg/dL 8.7  RA Latex Turbid.     <14 IU/mL <14  Compl, Total (CH50)     31 - 60 U/mL >60 (H)   Component     Latest Ref Rng & Units 04/16/2018  Ribonucleic Protein     0.0 - 0.9 AI <0.2  SSA (Ro) (ENA) Antibody, IgG     0.0 - 0.9 AI <0.2  Scleroderma (Scl-70) (ENA) Antibody, IgG     0.0 - 0.9 AI <0.2  ENA SM Ab Ser-aCnc     0.0 - 0.9 AI <0.2  SSB (La) (ENA) Antibody, IgG     0.0 - 0.9 AI <0.2  ds DNA Ab     0 - 9 IU/mL 32 (H)  Homogeneous Pattern      1:80  NOTE:      Comment  ANA Ab, IFA      Positive (A)  Anti JO-1     0.0 - 0.9 AI <0.2  B burgdorferi Ab IgG+IgM     0.00 - 0.90 ISR <0.91   Component     Latest Ref Rng & Units 04/15/2018  CK Total     44 - 196 U/L 40 (L)  CK, MB     0 - 5.0 ng/mL 1.3  Relative Index     0 - 4.0 3.3  Hepatitis C Ab     NON-REACTI NON-REACTIVE  SIGNAL TO CUT-OFF     <1.00 0.02  HIV     NON-REACTI NON-REACTIVE  RPR     NON-REACTI NON-REACTIVE   C-Reactive Protein, Cardiac     0.000 - 5.000 mg/L 1.520  T4,Free(Direct)     0.60 - 1.60 ng/dL 0.89   04/15/2018: CK 40, hep C negative, HIV negative, RPR negative, CRP 1.520, T4 0.89 04/06/2018 RNP negative, Ro-, La -, Scl-70 -, Smith negative, dsDNA 32, anti-Jo 1 negative, burgdorferi antibody negative  05/06/18: EBV IgM negative, Hgb A1c 5.6, Hep B -, ACTH 13, Cortisol 8.7, RF <14, CH50 >60, ANA 1:80 Homogenous May 2019 ESR 6, PPD negative March 2019 vitamin D 16.71  Positive ANA (antinuclear antibody) - ANA 1:80 Homogenous, RF -, Ro-, La-, smith -, RNP-, CH50 >60, CK 40, dsDNA 32, Anti-Jo 1 negative, Scl-70 negative.  Alex Dunn complains of fatigue and pain in his bilateral knee joints.  Alex Dunn has no other clinical features of autoimmune disease.  All his work-up by infectious disease nephrology and cardiology has been negative.  I will obtain following labs today.  I will also start him on low-dose prednisone as Alex Dunn is feeling quite uncomfortable.- Plan: TSH, VITAMIN D 25 Hydroxy (Vit-D Deficiency, Fractures), ANA, Pan-ANCA, C3 and C4, Cardiolipin antibodies, IgG, IgM, IgA, Lupus Anticoagulant Eval w/Reflex, Beta-2 glycoprotein antibodies, Cyclic citrul peptide antibody, IgG, Glucose 6 phosphate dehydrogenase, CK, Parvovirus B19 antibody, IgG and IgM  Positive double stranded DNA antibody test  Diagnostic/Imaging Studies:  July 28, 2018 MRI Brain with and without contrast  ORDERING CLINICIAN: Sarina Ill, MD  TECHNIQUE:MRI of the brain with and without contrast was obtained utilizing 5 mm axial slices with T1, T2, T2 flair, SWI and diffusion weighted views.  T1 sagittal, T2 coronal and postcontrast views in the axial and coronal plane were obtained.  CONTRAST: 17 ml Multihance  IMAGING SITE: [facility], [place].  FINDINGS: On sagittal images, the spinal cord is imaged caudally to C3-C4 and is normal in caliber.   The contents of the posterior fossa are of normal  size and position.   The pituitary gland and optic chiasm appear normal.   There is mild generalized cortical atrophy.  There are no abnormal extra-axial collections of fluid.    The cerebellum and brainstem appears normal.   The deep gray matter appears normal.  There are T2/FLAIR hyperintense foci in the periventricular and deep white matter of the hemispheres consistent with mild chronic microvascular ischemic change.  None of these appear to be acute..   Diffusion weighted images are normal.  Susceptibility weighted images are normal.     The orbits appear normal.   The VIIth/VIIIth nerve complex appears normal.  The mastoid air cells appear normal.  The paranasal sinuses appear normal.  Flow voids are identified within the major intracerebral arteries.  Incidental note is made of dolichoectasia of the vertebrobasilar arteries and the cavernous segments of the internal carotid arteries.    After the infusion of contrast material, a normal enhancement pattern is noted.  IMPRESSION: This MRI of the brain with and without contrast shows the following: 1.     Mild generalized cortical atrophy. 2.     T2/FLAIR hyperintense foci of the hemispheres consistent with mild chronic microvascular ischemic changes 3.     Vertebrobasilar and internal carotid artery dolichoectasia. 4.     There are no acute findings and there is a normal enhancement pattern.  Alex Pryce A. Felecia Shelling, MD, PhD, FAAN Certified in  [place] by [employer]  July 28, 2018 MRI of the cervical spine without intravenous contrast  ORDERING CLINICIAN: Sarina Ill, MD  TECHNIQUE: MRI of the cervical spine was obtained utilizing 3 mm sagittal slices from the posterior fossa down to the T3-4 level with T1, T2 and inversion recovery views. In addition 4 mm axial slices from U0-4 down to T1-2 level were included with T2 and gradient echo views.  CONTRAST: None IMAGING SITE: [facility], [place], [place], [place]  FINDINGS: :  On sagittal images, the spine is imaged from above the cervicomedullary junction to T2.  Vertebrobasilar dolichoectasia is noted.  The spinal cord is of normal caliber and signal.     There is 2 mm of anterolisthesis of C7 upon T1.  There are chronic endplate fractures of the C3-C6 vertebral bodies, likely osteoporotic..   The vertebral bodies have normal signal.  The discs and interspaces were further evaluated on axial views from C2 to T1 as follows:  C2-C3: There is mild right facet hypertrophy.  There is no foraminal narrowing or spinal stenosis.  There is no nerve root compression.  C3-C4: The central canal is narrowed but not enough to be considered spinal stenosis due to disc extrusion, uncovertebral spurring, facet hypertrophy and ligamentum flavum hypertrophy.  There is mild left and mild to moderate right foraminal narrowing but there does not appear to be nerve root compression.  C4-C5: There is disc bulging, facet hypertrophy and uncovertebral spurring causing mild to moderate bilateral foraminal narrowing but no nerve root compression.  C5-C6: There is mild spinal stenosis due to combination of disc protrusion, uncovertebral spurring and facet hypertrophy.  There is mild to moderate bilateral foraminal narrowing but no nerve root compression.  C6-C7: There is disc bulging causing mild foraminal narrowing but no nerve root compression.  C7-T1: There is 2 mm of anterolisthesis associated with facet hypertrophy and disc bulging.  There does not appear to be spinal stenosis or nerve root compression.  IMPRESSION: This MRI of the cervical spine without contrast shows the following: 1.     The spinal cord appears normal. 2.     At C3-C4, there is borderline spinal stenosis due to degenerative changes also causing mild to moderate foraminal narrowing.  There is no nerve root compression. 3.     At C4-C5, there is mild spinal stenosis  due to degenerative changes also causing mild to moderate foraminal narrowing.  There is no nerve root compression. 4.     At C7-T1, there is minimal anterolisthesis, facet hypertrophy and disc bulging.  There is no spinal stenosis or nerve root compression. 5.     There are milder degenerative changes at the other cervical levels as detailed above that do not lead to spinal stenosis or nerve root compression. 6.     There are endplate deformations involving the C3-C6 vertebral bodies most consistent with chronic osteoporotic endplate fractures. 7.     There are no acute findings.  Alex Dunn A. Felecia Shelling, MD, PhD, [facility]  May 03, 2018 CT ABDOMEN AND PELVIS WITH CONTRAST  TECHNIQUE: Multidetector CT imaging of the abdomen and pelvis was performed using the standard protocol following bolus administration of intravenous contrast.  CONTRAST:  6m ISOVUE-300 IOPAMIDOL (ISOVUE-300) INJECTION 61%  COMPARISON:  CT abdomen pelvis 03/19/2018  FINDINGS: Lower chest: Normal heart size. Dependent atelectasis right lower lobe. No pleural effusion.  Hepatobiliary: Liver is normal in size and contour. No focal lesion is identified. Gallbladder is unremarkable. No intrahepatic or extrahepatic biliary ductal dilatation.  Pancreas: Unremarkable  Spleen: Unremarkable  Adrenals/Urinary Tract: Adrenal glands are normal. Kidneys enhance symmetrically with contrast. Bilateral renal cysts measuring up to 7 cm within the superior pole of the right kidney. Additional smaller cysts are demonstrated within the right and left kidneys. Additionally, there are too small to characterize subcentimeter low-attenuation lesions. Urinary bladder is unremarkable.  Stomach/Bowel: Sigmoid colonic diverticulosis. No CT evidence for acute diverticulitis. Normal appendix. No evidence for small bowel obstruction. No free fluid or free intraperitoneal air. Descending duodenum diverticulum.  Vascular/Lymphatic:  Normal caliber abdominal aorta. Peripheral calcified atherosclerotic plaque. No retroperitoneal lymphadenopathy.  Reproductive: Prostate unremarkable.  Other: Small bilateral fat containing inguinal hernias, left-greater-than-right.  Musculoskeletal: Lumbar spine degenerative changes. No aggressive or acute appearing osseous lesions.  IMPRESSION: 1. No acute process within the abdomen or pelvis. 2. Heterogeneous opacities right lower lobe favored to represent atelectasis.  Alex Dunn M.D. 05/03/2018 12:21  Summary/Assessment: In the setting of a monoclonal IgA kappa paraproteinemia, Alex Dunn presents now for further diagnostic and therapeutic recommendations.  Beginning in March 2019, Alex Dunn developed drenching night sweats on a once weekly basis, generalized myalgias with extreme fatigue.  Alex Dunn was seen and evaluated at the emergency department in [place] where a viral syndrome was suspected.  Alex Dunn denied any cough, fever or gastrointestinal complaints.  Although his energy level never improved, the night sweats dissipated and Alex Dunn again was seen and evaluated in the emergency department in May complaining of shakiness, night sweats, generalized myalgias, and fatigue.  Over the past 4 months his appetite has declined and Alex Dunn has lost 20 pounds.  No skin lesions were identified. There was no itching. On May 03, 2018 through his primary care physician, CT imaging of the abdomen and pelvis wwas performed.  There was no acute process within the abdomen or pelvis.  Bilateral renal cyst however were noted measuring up to 7 cm within the superior pole of the right kidney.  Additional smaller cyst were seen within the left and right kidneys.  Those detailed results are outlined below.   Subsequently Alex Dunn was seen and evaluated by a number of subspecialists including Dr. Bo Merino, rheumatology.  On July 3, a lengthy rheumatologic/immunologic profile was requested. Those results are detailed above.   His ANA titer was relatively low, while his double-stranded DNA was positive.  Alex Dunn was started on prednisone 10 mg once daily.  It was subsequently tapered to prednisone: 2.5 mg once daily.  Alex Dunn continues on prednisone to the present for the past 3 months overall.    Alex Dunn was subsequently referred to Janene Madeira, infectious disease, nurse practitioner.  There was no evidence of active infection.  Alex Dunn was seen also by the nephrology service.  Those records are not available on the existing chart.  Alex Dunn was referred also to Dr. Sarina Ill, neurology, and a monoclonal IgA kappa chain paraprotein was identified in the blood.  Quantitative light chains and Bence-Jones proteins in the urine were not evaluated.  On May 03, 2018: A complete blood count showed hemoglobin 14.0 hematocrit 42.1 MCV 92.2 MCH 31.7 RDW 13.3 WBC 6.1 with 67% neutrophils 23% lymphocytes 9% monocytes 1% eosinophil; platelets 167,000.  On the same day, a comprehensive metabolic panel showed sodium 142 potassium 4.4 chloride 107 CO2 29 glucose 103 BUN 21 creatinine 1.5 total protein 6.2 calcium 9.1 albumin 3.3 SGOT 16 SGPT 14 alkaline phosphatase 66 total bilirubin 0.5.  Only 2 weeks ago on July 14, 5464: A basic metabolic panel showed sodium 142 potassium 4.7 chloride 104 CO2 25 glucose 89 BUN 22 creatinine 1.43 calcium 9.2.  A serum protein electrophoresis and immunofixation was performed on September 16.  The serum protein electrophoresis revealed a normal electrophoretic pattern with an IgA kappa light chain specific paraprotein by immunofixation.  Quantitative immunoglobulins: IgG 998 [phone]); IgA 608 [phone]); IgM 56 (15-143).  A free kappa/lambda light chain analysis was not performed.  Likewise there was no 24-hour urine protein electrophoresis and immunofixation.  Lamarion has no diabetes mellitus, coronary artery disease, or cardiac dysrhythmia.  Alex Dunn reports no seizure disorder or stroke syndrome.  There is no thyroid disease.   Alex Dunn denies peptic ulcer or gastroesophageal reflux disease.  Alex Dunn has no viral hepatitis, inflammatory bowel disease, or symptomatic diverticulosis.  Alex Dunn has benign prostatic hypertrophy and 2-3 time nocturia overnight for the past 5 years.  Alex Dunn has no rheumatoid or gouty arthritis.  Alex Dunn reports no peripheral arterial venous thromboembolic disease.  Alex Dunn admits to degenerative joint disease and pain in the knees and minimally neck..  In addition to a 20 pound weight loss, now improved over the past several weeks with prednisone, Alex Dunn denies any visual changes or hearing deficit.  Alex Dunn denies any headache, dizziness, lightheadedness, syncope, near syncopal episodes.  There is no unusual cough, sore throat, or orthopnea.  Alex Dunn has no dyspnea at rest.  Alex Dunn is chronic but stable exertional dyspnea unchanged over his baseline.  Alex Dunn continues to complain of significant fatigue.  There is no pain or difficulty in swallowing.  No current fever, shaking chills, sweats, or flulike symptoms are evident.  The drenching night sweats have resolved completely.  Alex Dunn has no heartburn or indigestion.  There is no nausea, vomiting, diarrhea, or constipation.  Alex Dunn generally moves his bowels once daily or every other day.  Alex Dunn denies melena or bright red blood per rectum.  There is no urinary frequency, urgency, hematuria, or dysuria.  There are no new focal areas of bone, joint, muscle pain.  Alex Dunn reports no bleeding tendency or easy bruisability.  Alex Dunn has no numbness or tingling in the fingers or toes.  His other comorbid problems include benign prostatic hypertrophy; diverticular disease; vitamin D deficiency on replacement; primary hypertension; positive double-stranded DNA (ANA titer elevated) on prednisone; a large right kidney cyst and smaller bilateral renal cysts; and chronic kidney disease.   Recommendation/Plan: I had a lengthy and detailed discussion with Xerxes and his wife Alex Dunn regarding the previous laboratory studies especially the  complete blood count, comprehensive metabolic panel, serum protein electrophoresis, and immunofixation.  While Alex Dunn has an IgG a kappa light chain restricted paraprotein, its significance at present is unclear.  Laboratory studies were obtained today to include kappa/lambda light chain analysis, a 24-hour urine protein electrophoresis and immunofixation, and beta-2 microglobulin.  A bone survey was requested to exclude an occult osteolytic/osteoblastic skeletal lesion.  Although Alex Dunn appears to have improved slightly with prednisone over the past 3 weeks, corticosteroids can suppress a plasma cell dyscrasia, making the results somewhat more difficult to interpret.  For that reason, we did discuss the possible role, rationale, and expectation of a bone marrow aspiration and biopsy. Depending on the results of the above described laboratory/imaging studies, it may be necessary.  Questions were answered to their satisfaction.  Barring any unforeseen complications, his next scheduled doctor visit to discuss those results Alex Dunn is on August 25, 2018.  Alex Dunn was advised to call us in the interim should any new or untoward problems arise.  The total time spent discussing the prior laboratory studies, their potential significance, methodology for evaluating IgA kappa light chain,  preliminary considerations and recommendations was 60 minutes.  At least 50% of that time was spent in discussion, reviewing outside records, laboratory evaluation, counseling, and answering questions. All questions were answered to his satisfaction.   This note was dictated using voice activated technology/software.  Unfortunately, typographical errors are not uncommon, and transcription is subject to mistakes and regrettably misinterpretation.  If necessary, clarification of the above information can be discussed with me at any time.  Thank you Drs. Caroline Sauger, and Deveshwar for allowing my participation in the care of Quintarius Ferns. I will keep you closely informed as the results of his preliminary laboratory data become available.  Please do not hesitate to call should any questions arise regarding this initial consultation and discussion.  FOLLOW UP: AS DIRECTED   cc:  Riki Sheer, DO          Sarina Ill MD         Bo Merino, MD         Bluford Kaufmann, MD  Henreitta Leber, MD  Hematology/Oncology [facility] [address] [place], [place] [address] Office: [phone] GBMB: [phone]

## 2018-08-12 NOTE — Telephone Encounter (Signed)
Scheduled appt per 10/9 los - gave patient AVS and calender per los.   

## 2018-08-12 NOTE — Patient Instructions (Signed)
We discussed in detail the results of your laboratory studies which revealed an increase in IgA.  Laboratory studies will be obtained today to determine if there are any increase in either immunoglobulin fragments in the blood or urine.  In addition your renal function and electrolytes will be checked.  A bone survey was requested within the next 2 weeks.  Barring any unforeseen complications, your next scheduled doctor visit to discuss those results he is on August 25, 2018.  Please do not hesitate to call should any questions arise in the interim.  Thank you! Ladona Ridgel, MD Hematology/Oncology [phone]

## 2018-08-13 ENCOUNTER — Encounter: Payer: Self-pay | Admitting: Gastroenterology

## 2018-08-13 ENCOUNTER — Ambulatory Visit: Payer: Medicare Other | Admitting: Gastroenterology

## 2018-08-13 VITALS — BP 134/70 | HR 69 | Ht 72.0 in | Wt 188.0 lb

## 2018-08-13 DIAGNOSIS — R5383 Other fatigue: Secondary | ICD-10-CM | POA: Diagnosis not present

## 2018-08-13 DIAGNOSIS — R197 Diarrhea, unspecified: Secondary | ICD-10-CM

## 2018-08-13 LAB — BETA 2 MICROGLOBULIN, SERUM: Beta-2 Microglobulin: 3.2 mg/L — ABNORMAL HIGH (ref 0.6–2.4)

## 2018-08-13 NOTE — Patient Instructions (Addendum)
If you are age 80 or older, your body mass index should be between 23-30. Your Body mass index is 25.5 kg/m. If this is out of the aforementioned range listed, please consider follow up with your Primary Care Provider.  If you are age 22 or younger, your body mass index should be between 19-25. Your Body mass index is 25.5 kg/m. If this is out of the aformentioned range listed, please consider follow up with your Primary Care Provider.   Start a daily probiotic, ie Align. It can be purchased over the counter. This puts good bacteria back into your colon. You should take 1 capsule by mouth once daily.   It was a pleasure to see you today!  Jene Oravec, D.O.

## 2018-08-13 NOTE — Progress Notes (Signed)
Chief Complaint: Diarrhea   Referring Provider:     Bo Merino, MD     HPI:     Alex Dunn is a 82 y.o. male referred to the [facility] for evaluation of watery, non-bloody stools x10 days. Some improvement with immodium, but recurred through the week. Improved with Pepto Bismol and has not recurred.  He did have some B/l LQ pain with sxs which has also resolved. Sxs now stopped completely. No preceding exposures. No n/v/f. Appetite and PO intake all preserved and at baseline.   He also endorses a history of fatigue and intermittent night sweats for a number of months now.  He has had an extensive work-up and follows with Rheumatology, Infectious Disease, Hematology/Oncology, his PCM, with an unrevealing work-up to date.  Overall, he initially lost 22 pounds, and has since gained 10 pounds back.  Normal CBC and LAEs yesterday. Creat 1.38.  Cologuard negative in June 2019.   Last colonoscopy approx 15 years ago. No hx of polyps.    Past Medical History:  Diagnosis Date  . ANEMIA-IRON DEFICIENCY 06/23/2007  . BENIGN PROSTATIC HYPERTROPHY 06/23/2007  . Diverticulosis   . GERD 06/23/2007  . Hypertension   . Polycystic kidney disease    stage 3 per wife     Past Surgical History:  Procedure Laterality Date  . HAND SURGERY    . LUNG SURGERY     for collapsed lung   Family History  Problem Relation Age of Onset  . Heart attack Mother   . Heart attack Father   . Heart attack Brother        "leaky valve"  . Neuromuscular disorder Neg Hx    Social History   Tobacco Use  . Smoking status: Never Smoker  . Smokeless tobacco: Never Used  Substance Use Topics  . Alcohol use: Never    Frequency: Never  . Drug use: Never   Current Outpatient Medications  Medication Sig Dispense Refill  . pantoprazole (PROTONIX) 40 MG tablet Take 1 tablet (40 mg total) by mouth daily as needed. (Patient taking differently: Take 40 mg by mouth daily as  needed (heartburn). ) 90 tablet 3  . predniSONE (DELTASONE) 5 MG tablet Take 2.5 mg by mouth daily with breakfast.      No current facility-administered medications for this visit.    Allergies  Allergen Reactions  . Codeine Phosphate Other (See Comments)    Seen odd things crawling up wall  . Lexapro [Escitalopram Oxalate] Other (See Comments)    shaking     Review of Systems: All systems reviewed and negative except where noted in HPI.     Physical Exam:    Wt Readings from Last 3 Encounters:  08/13/18 188 lb (85.3 kg)  08/12/18 189 lb 6.4 oz (85.9 kg)  08/10/18 187 lb 9.6 oz (85.1 kg)    BP 134/70   Pulse 69   Ht 6' (1.829 m)   Wt 188 lb (85.3 kg)   BMI 25.50 kg/m  Constitutional:  Pleasant, in no acute distress. Psychiatric: Normal mood and affect. Behavior is normal. EENT: Pupils normal.  Conjunctivae are normal. No scleral icterus. Neck supple. No cervical LAD. Cardiovascular: Normal rate, regular rhythm. No edema Pulmonary/chest: Effort normal and breath sounds normal. No wheezing, rales or rhonchi. Abdominal: Soft, nondistended, nontender. Bowel sounds active throughout. There are no masses palpable. No hepatomegaly. Neurological: Alert and oriented to person place  and time. Skin: Skin is warm and dry. No rashes noted.   ASSESSMENT AND PLAN;   Alex Dunn is a 82 y.o. male presenting with recent onset diarrhea.  1) diarrhea: Acute onset diarrhea, nonbloody stools which has since resolved with Imodium and Pepto-Bismol.  No preceding antibiotic exposure or travel.  He is no longer taking Pepto or Imodium and symptoms have not recurred.  Otherwise no upper GI symptoms.  Clinical presentation seems most consistent with acute self-limiting infectious gastroenteritis.  Since symptoms are now completely resolved do not feel there is any need for intervention or further evaluation for this at this time.  Otherwise, do not see any indications for EGD or  colonoscopy for this or work-up of his more chronic issues at this time.  -RTC PRN   Lavena Bullion, DO, FACG  08/13/2018, 9:16 AM   Ofilia Neas, PA-C

## 2018-08-14 LAB — KAPPA/LAMBDA LIGHT CHAINS
Kappa free light chain: 48.9 mg/L — ABNORMAL HIGH (ref 3.3–19.4)
Kappa, lambda light chain ratio: 2.7 — ABNORMAL HIGH (ref 0.26–1.65)
Lambda free light chains: 18.1 mg/L (ref 5.7–26.3)

## 2018-08-18 DIAGNOSIS — D472 Monoclonal gammopathy: Secondary | ICD-10-CM | POA: Diagnosis not present

## 2018-08-19 LAB — KAPPA/LAMBDA LIGHT CHAINS, FREE, WITH RATIO, 24HR. URINE
FR KAPPA LT CH,24HR: 61 mg/24 hr
FR LAMBDA LT CH,24HR: 3 mg/(24.h)
FREE LAMBDA LT CHAINS, UR: 2.82 mg/L (ref 0.24–6.66)
Free Kappa Lt Chains,Ur: 64.7 mg/L — ABNORMAL HIGH (ref 1.35–24.19)
Free Kappa/Lambda Ratio: 22.94 — ABNORMAL HIGH (ref 2.04–10.37)
TOTAL VOLUME: 950

## 2018-08-21 ENCOUNTER — Ambulatory Visit: Payer: Medicare Other | Admitting: Family Medicine

## 2018-08-24 ENCOUNTER — Encounter: Payer: Self-pay | Admitting: Family Medicine

## 2018-08-24 ENCOUNTER — Ambulatory Visit: Payer: Medicare Other | Admitting: Family Medicine

## 2018-08-24 VITALS — BP 132/82 | HR 65 | Temp 98.5°F | Ht 72.0 in | Wt 189.6 lb

## 2018-08-24 DIAGNOSIS — I10 Essential (primary) hypertension: Secondary | ICD-10-CM

## 2018-08-24 DIAGNOSIS — D472 Monoclonal gammopathy: Secondary | ICD-10-CM

## 2018-08-24 HISTORY — DX: Monoclonal gammopathy: D47.2

## 2018-08-24 NOTE — Progress Notes (Signed)
Chief Complaint  Patient presents with  . New Patient (Initial Visit)       New Patient Visit SUBJECTIVE: HPI: Alex Dunn is an 82 y.o.male who is being seen for establishing care.  The patient was previously seen at a fellow [facility] office. Provider retired.  He is here with his wife.  Hx of MGUS, sees oncology tomorrow.  Over the past 6 months, he has been having increased fatigue and unintentional weight loss.  Through this time, he was placed on prednisone daily by the rheumatology team.  There is no official rheumatologic diagnosis or follow-up.  He tried to come off of it in the past and noticed symptoms returning after 3 days of being off of it.  Ideally, he would like to come off of it.  He has a history of high blood pressure that is currently well controlled with diet alone.  He checks his blood pressure intermittently and notices it is mainly the 130s/70s.  He does try to stay physically active and denies any chest pain or shortness of breath.     Allergies  Allergen Reactions  . Codeine Phosphate Other (See Comments)    Seen odd things crawling up wall  . Lexapro [Escitalopram Oxalate] Other (See Comments)    shaking    Past Medical History:  Diagnosis Date  . ANEMIA-IRON DEFICIENCY 06/23/2007  . BENIGN PROSTATIC HYPERTROPHY 06/23/2007  . Diverticulosis   . GERD 06/23/2007  . Hypertension   . Polycystic kidney disease    stage 3 per wife   Past Surgical History:  Procedure Laterality Date  . HAND SURGERY    . LUNG SURGERY     for collapsed lung      Current Outpatient Medications:  .  pantoprazole (PROTONIX) 40 MG tablet, Take 1 tablet (40 mg total) by mouth daily as needed. (Patient taking differently: Take 40 mg by mouth daily as needed (heartburn). ), Disp: 90 tablet, Rfl: 3 .  predniSONE (DELTASONE) 5 MG tablet, Take 2.5 mg by mouth daily with breakfast. , Disp: , Rfl:   ROS Cardiovascular: Denies chest pain  Respiratory: Denies dyspnea    OBJECTIVE: BP 132/82 (BP Location: Left Arm, Patient Position: Sitting, Cuff Size: Normal)   Pulse 65   Temp 98.5 F (36.9 C) (Oral)   Ht 6' (1.829 m)   Wt 189 lb 9 oz (86 kg)   SpO2 97%   BMI 25.71 kg/m   Constitutional: -  VS reviewed -  Well developed, well nourished, appears stated age -  No apparent distress  Psychiatric: -  Oriented to person, place, and time -  Memory intact -  Affect and mood normal -  Fluent conversation, good eye contact -  Judgment and insight age appropriate  Eye: -  Conjunctivae clear, no discharge -  Pupils symmetric, round, reactive to light  ENMT: -  MMM    Pharynx moist, no exudate, no erythema  Neck: -  No gross swelling, no palpable masses -  Thyroid midline, not enlarged, mobile, no palpable masses  Cardiovascular: -  RRR -  No bruits -  No LE edema  Respiratory: -  Normal respiratory effort, no accessory muscle use, no retraction -  Breath sounds equal, no wheezes, no ronchi, no crackles  Skin: -  No significant lesion on inspection -  Warm and dry to palpation   ASSESSMENT/PLAN: Essential hypertension  MGUS (monoclonal gammopathy of unknown significance)  Okay to stop checking blood pressures at home.  Counseled on  diet and exercise. I would like him to ask his oncologist whether or not he needs to be on the prednisone.  If no, we will start to wean him down.  Can give a flu shot with oncology approval as well. Patient should return 6 months for physical. The patient voiced understanding and agreement to the plan.   [place], DO 08/24/18  11:58 AM

## 2018-08-24 NOTE — Progress Notes (Signed)
[facility] Outpatient Progress Note  Patient Name:  Alex Dunn  DOB: Feb 17, 1936   Date of Service: August 25, 2017  Referring Provider: Riki Sheer,  [address] [place], [place] [address]   Consulting Physician: Henreitta Leber, MD Hematology/Oncology  Reason for Visit: In the setting of a monoclonal IgA kappa paraproteinemia, he presents now for the results of his preliminary laboratory evaluation and recommendation.  Brief History: Alex Dunn is an 82 year old resident of [place] whose past medical history is significant for benign prostatic hypertrophy; diverticular disease; vitamin D deficiency on replacement; primary hypertension; positive double-stranded DNA (ANA titer elevated) on prednisone; a large right kidney cyst and smaller bilateral renal cysts; and chronic kidney disease.  He is a lifetime nondrinker and non-smoker.  He is accompanied by his attentive wife, Alex Dunn.  His primary care physician is Dr. Riki Sheer.  Beginning in March 2019, he developed drenching night sweats on a once weekly basis, generalized myalgias with extreme fatigue.  He was seen and evaluated at the emergency department in [place] where a viral syndrome was suspected.  He denied any cough, fever or gastrointestinal complaints. Although his energy level never improved, the night sweats dissipated and he again was seen and evaluated in the emergency department in May complaining of shakiness, night sweats, generalized myalgias, and fatigue.  Over the past 4 months his appetite has declined and he has lost 20 pounds.  No skin lesions were identified.  There was no itching..  On May 03, 2018 through his primary care physician, CT imaging of the abdomen and pelvis wwas performed.  There was no acute process within the abdomen or pelvis.  Bilateral renal cyst however were noted measuring up to 7 cm within the superior pole of the  right kidney.  Additional smaller cyst were seen within the left and right kidneys.  Those detailed results are outlined below.   Subsequently, he was seen and evaluated by a number of subspecialists including Dr. Bo Merino, rheumatology.  On July 3, a lengthy immunologic profile was requested. Those results are detailed below.  His ANA titer was relatively low, while his double-stranded DNA was positive.  He was started on prednisone 10 mg  It was subsequently tapered to prednisone: 2.5 mg once daily. He continues on prednisone to the present.  He was referred thereafter to Janene Madeira, infectious disease nurse practitioner.  There was no evidence of active infection.  He was seen also by the nephrology service.  Those records are not available on the existing chart.  He was referred also to Dr. Sarina Ill, neurology, when a monoclonal IgA kappa chain paraprotein was identified in the blood. Quantitative light chains and Bence-Jones proteins were not evaluated.  On May 03, 2018: A complete blood count showed hemoglobin 14.0 hematocrit 42.1 MCV 92.2 MCH 31.7 RDW 13.3 WBC 6.1 with 67% neutrophils 23% lymphocytes 9% monocytes 1% eosinophil; platelets 167,000.  On the same day, a comprehensive metabolic panel showed sodium 142 potassium 4.4 chloride 107 CO2 29 glucose 103 BUN 21 creatinine 1.5 total protein 6.2 calcium 9.1 albumin 3.3 SGOT 16 SGPT 14 alkaline phosphatase 66 total bilirubin 0.5.  Only 2 weeks ago on July 14, 2018: A comprehensive metabolic panel showed sodium 142 potassium 4.7 chloride 104 CO2 25 glucose 89 BUN 22 creatinine 1.43 calcium 9.2.  A serum protein electrophoresis and immunofixation was performed on September 16.  The serum protein electrophoresis revealed a normal electrophoresis with an  IgG kappa light chain specific paraprotein by immunofixation.  Quantitative immunoglobulins: IgG 998 ([phone]); IgA 608 [phone]); IgM 56 (15-143).  A free kappa/lambda light  chain analysis was not performed.  Likewise there was no 24-hour urine protein electrophoresis and immunofixation.  He has benign prostatic hypertrophy and 2-3 time nocturia overnight for the past 5 years. He admits to degenerative joint disease and pain in the knees and minimally neck.  At the time of his initial evaluation on October 9, we had a lengthy and detailed discussion with Alex Dunn and his wife Alex Dunn regarding the previous laboratory studies especially the complete blood count, comprehensive metabolic panel, serum protein electrophoresis, and immunofixation.  While he has an IgG a kappa light chain restricted paraprotein, its significance was unclear.  Although he appears to have improved slightly in terms of appetite and weight gain with prednisone over the past 4 weeks, corticosteroids can suppress a plasma cell dyscrasia, making the results somewhat more difficult to interpret. For that reason, we did discuss the possible role, rationale, and expectation of a bone marrow aspiration and biopsy.  He has since discontinued prednisone completely over the past 2 days. Additional laboratory studies were obtained to include kappa/lambda light chain analysis, beta-2 microglobulin, and a 24-hour urine protein electrophoresis and immunofixation.  Those results are detailed below.  It is with this background he presents now for the results of his preliminary laboratory evaluation and recommendations.  Interval History: In the interim since his last visit, both his appetite and weight remain stable. He has no visual changes or hearing deficit.    He reports no rash or itching.  He denies any headache, dizziness, lightheadedness, syncope, near syncopal episodes.  There is no unusual cough, sore throat, or orthopnea.  He has no dyspnea at rest.  He is chronic but stable exertional dyspnea unchanged over his baseline.  He continues to complain of significant fatigue.  There is no pain or difficulty in swallowing.   No current fever, shaking chills, sweats, or flulike symptoms are evident.  The drenching night sweats have resolved completely. He has no heartburn or indigestion.  There is no nausea, vomiting, diarrhea, or constipation.  He generally moves his bowels once daily or every other day.  He denies melena or bright red blood per rectum. There is no urinary frequency, urgency, hematuria, or dysuria.  There are no new focal areas of bone, joint, muscle pain.  He reports no bleeding tendency or easy bruisability.  He has no numbness or tingling in the fingers or toes.   Past Medical History Reviewed        Family History Reviewed       Social History Reviewed  Past Medical History:  Diagnosis Date  . ANEMIA-IRON DEFICIENCY 06/23/2007  . BENIGN PROSTATIC HYPERTROPHY 06/23/2007  . Diverticulosis   . GERD 06/23/2007  . Hypertension   . Polycystic kidney disease    stage 3 per wife   Allergies  Allergen Reactions  . Codeine Phosphate Other (See Comments)    Seen odd things crawling up wall  . Lexapro [Escitalopram Oxalate] Other (See Comments)    shaking   Current Outpatient Medications on File Prior to Visit  Medication Sig  . pantoprazole (PROTONIX) 40 MG tablet Take 1 tablet (40 mg total) by mouth daily as needed. (Patient taking differently: Take 40 mg by mouth daily as needed (heartburn). )  . predniSONE (DELTASONE) 5 MG tablet Take 2.5 mg by mouth daily with breakfast.    No current  facility-administered medications on file prior to visit.     Review of Systems: Constitutional: No fever; previous drenching sweats, now dissipated; no shaking chills.  Appetite decline and 20 pound weight loss over the past 4 months, now improving; energy level low. Skin: No rash, scaling, sores, lumps, or jaundice. HEENT: No visual changes or hearing deficit; he wears corrective lens. Pulmonary: No unusual cough, sore throat, or orthopnea; no allergic rhinitis or chronic sinusitis. Cardiovascular: No  coronary artery disease, angina, or myocardial infarction.  No cardiac dysrhythmia; essential hypertension.No dyslipidemia. Gastrointestinal: No indigestion, dysphagia, abdominal pain, diarrhea, or constipation.  No change in bowel habits.  No nausea or vomiting.  No melena or bright red blood per rectum. Genitourinary: No frequency, urgency, hematuria, or dysuria; nocturia 3 times nightly; benign prostatic hypertrophy. Musculoskeletal: Degenerative joint disease involving the knees and neck.  No new arthralgias or myalgias; no joint swelling, pain, or instability. Hematologic: No bleeding tendency or easy bruisability; IgA kappa light chain paraproteinemia. Endocrine: No intolerance to heat or cold; no thyroid disease or diabetes mellitus. Vascular: No peripheral arterial or venous thromboembolic disease. Psychological: No anxiety, depression, or mood changes; no mental health illnesses. Neurological: No dizziness, lightheadedness, syncope, or near syncopal episodes; no numbness or tingling in the fingers or toes.  No seizure disorder or stroke syndrome.  Physical Examination: Vital Signs: Body surface area is 2.11 meters squared.  Vitals:   08/25/18 1341  BP: (!) 143/77  Pulse: 70  Resp: 17  Temp: 98.6 F (37 C)  SpO2: 100%    Filed Weights   08/25/18 1341  Weight: 190 lb (86.2 kg)  Body mass index is 25.07 kg/m. ECOG PERFORMANCE STATUS: 1 Constitutional:  Alex Dunn is fully nourished and developed.  He looks age appropriate.  He is friendly and cooperative without respiratory compromise at rest. Skin: No rashes, scaling, dryness, jaundice, or itching. HEENT: Head is normocephalic and atraumatic.  Pupils are equal round and reactive to light and accommodation.  Sclerae are anicteric.  Conjunctivae are pink.  No sinus tenderness nor oropharyngeal lesions.  Lips without cracking or peeling; tongue without mass, inflammation, or nodularity.  Mucous membranes are moist. Neck: Supple  and symmetric.  No jugular venous distention or thyromegaly.  Trachea is midline. Lymphatics: No cervical or supraclavicular lymphadenopathy.  No epitrochlear, axillary, or inguinal lymphadenopathy is appreciated. Respiratory/chest: Thorax is symmetrical.  Breath sounds are clear to auscultation and percussion.  Normal excursion and respiratory effort. Back: Symmetric without deformity or tenderness. Cardiovascular: Heart rate and rhythm are regular without murmurs. Gastrointestinal: Abdomen is soft, nontender; no organomegaly.  Bowel sounds are normoactive.  No masses are appreciated. Genitourinary: Normal external male genitalia. Rectal examination: Not performed. Extremities: In the lower extremities, there is no asymmetric swelling, erythema, tenderness, or cord formation.  No clubbing, cyanosis, nor edema. Hematologic: No petechiae, hematomas, or ecchymoses. Psychological:  He is oriented to person, place, and time; normal affect, memory, and cognition. Neurological: There are no gross neurologic deficits.  Laboratory Results: August 12, 2018  Ref Range & Units 12d ago  WBC Count 4.0 - 10.5 K/uL 7.3   RBC 4.22 - 5.81 MIL/uL 4.43   Hemoglobin 13.0 - 17.0 g/dL 14.0   HCT 39.0 - 52.0 % 42.8   MCV 80.0 - 100.0 fL 96.6   MCH 26.0 - 34.0 pg 31.6   MCHC 30.0 - 36.0 g/dL 32.7   RDW 11.5 - 15.5 % 12.7   Platelet Count 150 - 400 K/uL 168   nRBC  0.0 - 0.2 % 0.0   Neutrophils Relative % % 71   Neutro Abs 1.7 - 7.7 K/uL 5.2   Lymphocytes Relative % 20   Lymphs Abs 0.7 - 4.0 K/uL 1.5   Monocytes Relative % 7   Monocytes Absolute 0.1 - 1.0 K/uL 0.5   Eosinophils Relative % 1   Eosinophils Absolute 0.0 - 0.5 K/uL 0.0   Basophils Relative % 0   Basophils Absolute 0.0 - 0.1 K/uL 0.0   Immature Granulocytes % 1   Abs Immature Granulocytes 0.00 - 0.07 K/uL 0.04    CBC Latest Ref Rng & Units 08/12/2018 05/03/2018 04/15/2018  WBC 4.0 - 10.5 K/uL 7.3 6.1 7.6  Hemoglobin 13.0 - 17.0 g/dL 14.0 14.0  14.9  Hematocrit 39.0 - 52.0 % 42.8 42.1 44.3  Platelets 150 - 400 K/uL 168 167 208.0    CMP Latest Ref Rng & Units 08/12/2018 07/20/2018 05/03/2018  Glucose 70 - 99 mg/dL 96 89 103(H)  BUN 8 - 23 mg/dL '20 22 21  '$ Creatinine 0.61 - 1.24 mg/dL 1.38(H) 1.43(H) 1.50(H)  Sodium 135 - 145 mmol/L 141 142 142  Potassium 3.5 - 5.1 mmol/L 4.3 4.7 4.4  Chloride 98 - 111 mmol/L 106 104 107  CO2 22 - 32 mmol/L '27 25 29  '$ Calcium 8.9 - 10.3 mg/dL 9.0 9.2 9.1  Total Protein 6.5 - 8.1 g/dL 6.9 6.6 6.2(L)  Total Bilirubin 0.3 - 1.2 mg/dL 0.4 - 0.5  Alkaline Phos 38 - 126 U/L 75 - 66  AST 15 - 41 U/L 15 - 16  ALT 0 - 44 U/L 13 - 14  Reticulocyte count 0.8% LDH 136 Beta-2 microglobulin 3.2 (0.6-2.4) Free kappa light chain 48.9 (3.3-19.4) Free lambda light chain 18.1 (5.7-26.3) Free kappa/lambda light chain ratio 2.7 (0.26-1.65) Reticulocyte count 0.8% Beta-2 microglobulin 3.2 LDH 136 Kappa/lambda light chain analysis: Free kappa light chain: 48.9 Free lambda light chain 18.1 Kappa/lambda light chain ratio 2.70 (0.26-1.65).  24-hour Urine kappa/lambda light chain analysis: Free kappa 64.70 Free lambda 2.82 Free kappa/lambda light chain ratio (urine) 22.94 (2.04-10.37).  July 20, 2018  Ref Range & Units 576moago 371mogo 49m48moo 76mo12mo 75mo50mo IgG (Immunoglobin G), Serum 700 - 1,600 mg/dL 998       IgA/Immunoglobulin A, Serum 61 - 437 mg/dL 608High        IgM (Immunoglobulin M), Srm 15 - 143 mg/dL 56       Total Protein 6.0 - 8.5 g/dL 6.6  6.2Low  R 6.9 R 7.3 R 6.7 R  Albumin SerPl Elph-Mcnc 2.9 - 4.4 g/dL 3.3       Alpha 1 0.0 - 0.4 g/dL 0.2       Alpha2 Glob SerPl Elph-Mcnc 0.4 - 1.0 g/dL 0.8       B-Globulin SerPl Elph-Mcnc 0.7 - 1.3 g/dL 1.2       Gamma Glob SerPl Elph-Mcnc 0.4 - 1.8 g/dL 1.1       M Protein SerPl Elph-Mcnc Not Observed g/dL 0.3High        Globulin, Total 2.2 - 3.9 g/dL 3.3       Albumin/Glob SerPl 0.7 - 1.7 1.1       IFE 1  Comment       Comment: Immunofixation  shows IgA monoclonal protein with kappa light chain  specificity.     Component Latest Ref Rng & Units 05/06/2018  EBV VCA IgM U/mL <36.00  EBV VCA IgG U/mL 119.00 (H)  EBV NA  IgG U/mL 275.00 (H)  Interpretation    Hemoglobin A1C <5.7 % of total Hgb 5.6  Mean Plasma Glucose (calc) 114  eAG (mmol/L) (calc) 6.3  Hepatitis B Surface Ag NON-REACTI NON-REACTIVE  C206 ACTH 6 - 50 pg/mL 13  Cortisol - AM mcg/dL 8.7  RA Latex Turbid. <14 IU/mL <14  Compl, Total (CH50) 31 - 60 U/mL >60 (H)   Component Latest Ref Rng & Units 04/16/2018  Ribonucleic Protein 0.0 - 0.9 AI <0.2  SSA (Ro) (ENA) Antibody, IgG 0.0 - 0.9 AI <0.2  Scleroderma (Scl-70) (ENA) Antibody, IgG 0.0 - 0.9 AI <0.2  ENA SM Ab Ser-aCnc 0.0 - 0.9 AI <0.2  SSB (La) (ENA) Antibody, IgG 0.0 - 0.9 AI <0.2  ds DNA Ab 0 - 9 IU/mL 32 (H)  Homogeneous Pattern  1:80  NOTE:  Comment  ANA Ab, IFA  Positive (A)  Anti JO-1 0.0 - 0.9 AI <0.2  B burgdorferi Ab IgG+IgM 0.00 - 0.90 ISR <0.91   Component Latest Ref Rng & Units 04/15/2018  CK Total 44 - 196 U/L 40 (L)  CK, MB 0 - 5.0 ng/mL 1.3  Relative Index 0 - 4.0 3.3  Hepatitis C Ab NON-REACTI NON-REACTIVE  SIGNAL TO CUT-OFF <1.00 0.02  HIV NON-REACTI NON-REACTIVE  RPR NON-REACTI NON-REACTIVE  C-Reactive Protein, Cardiac 0.000 - 5.000 mg/L 1.520  T4,Free(Direct) 0.60 - 1.60 ng/dL 0.89   04/15/2018: CK 40, hep C negative, HIV negative, RPR negative, CRP 1.520, T4 0.89 04/06/2018 RNP negative, Ro-, La -, Scl-70 -, Smith negative, dsDNA 32, anti-Jo 1 negative, burgdorferi antibody negative  05/06/18: EBV IgM negative, Hgb A1c 5.6, Hep B -, ACTH 13, Cortisol 8.7, RF <14, CH50 >60, ANA 1:80 Homogenous May 2019 ESR 6, PPD negative March 2019 vitamin D 16.71  Positive ANA (antinuclear antibody) - ANA 1:80 Homogenous, RF  -, Ro-, La-, smith -, RNP-, CH50 >60, CK 40, dsDNA 32, Anti-Jo 1 negative, Scl-70 negative. He complains of fatigue and pain in his bilateral knee joints. He has no other clinical features of autoimmune disease. All his work-up by infectious disease nephrology and cardiology has been negative. I will obtain following labs today. I will also start him on low-dose prednisone as he is feeling quite uncomfortable.- Plan: TSH, VITAMIN D 25 Hydroxy (Vit-D Deficiency, Fractures), ANA, Pan-ANCA, C3 and C4, Cardiolipin antibodies, IgG, IgM, IgA, Lupus Anticoagulant Eval w/Reflex, Beta-2 glycoprotein antibodies, Cyclic citrul peptide antibody, IgG, Glucose 6 phosphate dehydrogenase, CK, Parvovirus B19 antibody, IgG and IgM.  Positive double stranded DNA antibody test  Diagnostic/Imaging Studies: July 28, 2018 MRI Brain with and without contrast  ORDERING CLINICIAN: Sarina Ill, MD  TECHNIQUE:MRI of the brain with and without contrast was obtained utilizing 5 mm axial slices with T1, T2, T2 flair, SWI and diffusion weighted views. T1 sagittal, T2 coronal and postcontrast views in the axial and coronal plane were obtained.  CONTRAST: 17 ml Multihance  IMAGING SITE: [facility], [place].  FINDINGS: On sagittal images, the spinal cord is imaged caudally to C3-C4and is normal in caliber. The contents of the posterior fossa are of normal size and position. The pituitary gland and optic chiasm appear normal. There is mild generalized cortical atrophy.There are no abnormal extra-axial collections of fluid.   The cerebellum and brainstem appears normal. The deep gray matter appears normal. There are T2/FLAIR hyperintense foci in the periventricular and deep white matter of the hemispheres consistent with mild chronic microvascular ischemic change. None of these appear to be  acute.. Diffusion weighted images are normal. Susceptibility weighted images are  normal.   The orbits appear normal. The VIIth/VIIIth nerve complex appears normal. The mastoid air cells appear normal. The paranasal sinuses appear normal. Flow voids are identified within the major intracerebral arteries. Incidental note is made of dolichoectasia of the vertebrobasilar arteries and the cavernous segments of the internal carotid arteries.   After the infusion of contrast material, a normal enhancement pattern is noted.  IMPRESSION: This MRI of the brain with and without contrast shows the following: 1. Mild generalized cortical atrophy. 2. T2/FLAIR hyperintense foci of the hemispheres consistent with mild chronic microvascular ischemic changes 3. Vertebrobasilar and internal carotid artery dolichoectasia. 4. There are no acute findings and there is a normal enhancement pattern.  Lisset Ketchem A. Felecia Shelling, MD, PhD, FAAN Certified in [place] by [employer]  July 28, 2018 MRI of the cervical spine without intravenous contrast  ORDERING CLINICIAN: Sarina Ill, MD  TECHNIQUE: MRI of the cervical spine was obtained utilizing 3 mm sagittal slices from the posterior fossa down to the T3-4 level with T1, T2 and inversion recovery views. In addition 4 mm axial slices from X8-3 down to T1-2 level were included with T2 and gradient echo views.  CONTRAST: None IMAGING SITE: [facility], [place], [place], [place]  FINDINGS: : On sagittal images, the spine is imaged from above the cervicomedullary junction to T2. Vertebrobasilar dolichoectasia is noted. The spinal cord is of normal caliber and signal.   There is 2 mm of anterolisthesis of C7 upon T1. There are chronic endplate fractures of the C3-C6 vertebral bodies, likely osteoporotic.. The vertebral bodies have normal signal.   The discs and interspaces were further evaluated on axial views from C2 to T1 as follows:  C2-C3: There is mild right  facet hypertrophy. There is no foraminal narrowing or spinal stenosis. There is no nerve root compression.  C3-C4: The central canal is narrowed but not enough to be considered spinal stenosis due to disc extrusion, uncovertebral spurring, facet hypertrophy and ligamentum flavum hypertrophy. There is mild left and mild to moderate right foraminal narrowing but there does not appear to be nerve root compression.  C4-C5: There is disc bulging, facet hypertrophy and uncovertebral spurring causing mild to moderate bilateral foraminal narrowing but no nerve root compression.  C5-C6: There is mild spinal stenosis due to combination of disc protrusion, uncovertebral spurring and facet hypertrophy. There is mild to moderate bilateral foraminal narrowing but no nerve root compression.  C6-C7: There is disc bulging causing mild foraminal narrowing but no nerve root compression.  C7-T1: There is 2 mm of anterolisthesis associated with facet hypertrophy and disc bulging. There does not appear to be spinal stenosis or nerve root compression.  IMPRESSION: This MRI of the cervical spine without contrast shows the following: 1. The spinal cord appears normal. 2. At C3-C4, there is borderline spinal stenosis due to degenerative changes also causing mild to moderate foraminal narrowing. There is no nerve root compression. 3. At C4-C5, there is mild spinal stenosis due to degenerative changes also causing mild to moderate foraminal narrowing. There is no nerve root compression. 4. At C7-T1, there is minimal anterolisthesis, facet hypertrophy and disc bulging. There is no spinal stenosis or nerve root compression. 5. There are milder degenerative changes at the other cervical levels as detailed above that do not lead to spinal stenosis or nerve root compression. 6. There are endplate deformations involving the C3-C6 vertebral bodies most consistent with chronic osteoporotic  endplate fractures. 7. There are no acute findings.  Kariyah Baugh A. Felecia Shelling, MD, PhD, [facility]  May 03, 2018 CT ABDOMEN AND PELVIS WITH CONTRAST  TECHNIQUE: Multidetector CT imaging of the abdomen and pelvis was performed using the standard protocol following bolus administration of intravenous contrast.  CONTRAST: 36m ISOVUE-300 IOPAMIDOL (ISOVUE-300) INJECTION 61%  COMPARISON: CT abdomen pelvis 03/19/2018  FINDINGS: Lower chest: Normal heart size. Dependent atelectasis right lower lobe. No pleural effusion.  Hepatobiliary: Liver is normal in size and contour. No focal lesion is identified. Gallbladder is unremarkable. No intrahepatic or extrahepatic biliary ductal dilatation.  Pancreas: Unremarkable  Spleen: Unremarkable  Adrenals/Urinary Tract: Adrenal glands are normal. Kidneys enhance symmetrically with contrast. Bilateral renal cysts measuring up to 7 cm within the superior pole of the right kidney. Additional smaller cysts are demonstrated within the right and left kidneys. Additionally, there are too small to characterize subcentimeter low-attenuation lesions. Urinary bladder is unremarkable.  Stomach/Bowel: Sigmoid colonic diverticulosis. No CT evidence for acute diverticulitis. Normal appendix. No evidence for small bowel obstruction. No free fluid or free intraperitoneal air. Descending duodenum diverticulum.  Vascular/Lymphatic: Normal caliber abdominal aorta. Peripheral calcified atherosclerotic plaque. No retroperitoneal lymphadenopathy.  Reproductive: Prostate unremarkable.  Other: Small bilateral fat containing inguinal hernias, left-greater-than-right.  Musculoskeletal: Lumbar spine degenerative changes. No aggressive or acute appearing osseous lesions.  IMPRESSION: 1. No acute process within the abdomen or pelvis. 2. Heterogeneous opacities right lower lobe favored to represent atelectasis.  DLovey NewcomerM.D. 05/03/2018  12:21  Summary/Assessment: In the setting of a monoclonal IgA kappa paraproteinemia, he presents now for the results of his preliminary laboratory evaluation and recommendations.  Beginning in March 2019, he developed drenching night sweats on a once weekly basis, generalized myalgias with extreme fatigue.  [place]  He denied any cough, fever or gastrointestinal complaints. Although his energy level never improved, the night sweats dissipated and he again was seen and evaluated in the emergency department in May complaining of shakiness, night sweats, generalized myalgias, and fatigue.    Over the past 4 months his appetite has declined and he has lost 20 pounds.  No skin lesions were identified.  There was no itching..  On May 03, 2018 through his primary care physician, CT imaging of the abdomen and pelvis wwas performed.  There was no acute process within the abdomen or pelvis.  Bilateral renal cyst however were noted measuring up to 7 cm within the superior pole of the right kidney.  Additional smaller cyst were seen within the left and right kidneys.  Those detailed results are outlined below.   Subsequently, he was seen and evaluated by a number of subspecialists including Dr. SBo Merino rheumatology.  On July 3, a lengthy immunologic profile was requested. Those results are detailed below.  His ANA titer was relatively low, while his double-stranded DNA was positive.  He was started on prednisone 10 mg  It was subsequently tapered to prednisone: 2.5 mg once daily.  He stopped prednisone 2 days ago. He was referred thereafter to SJanene Madeira infectious disease nurse practitioner. There was no evidence of active infection.  He was seen also by the nephrology service. Those records are not available on the existing chart.  He was referred also to Dr. ASarina Ill neurology, when a monoclonal  IgA kappa chain paraprotein was identified in the blood. Quantitative light chains and Bence-Jones proteins were not evaluated.  On May 03, 2018: A complete blood count showed hemoglobin 14.0 hematocrit 42.1 MCV 92.2 MCH 31.7 RDW 13.3 WBC 6.1 with 67% neutrophils 23% lymphocytes 9% monocytes 1% eosinophil; platelets 167,000.  On the same day, a comprehensive metabolic panel showed sodium 142 potassium 4.4 chloride 107 CO2 29 glucose 103 BUN 21 creatinine 1.5 total protein 6.2 calcium 9.1 albumin 3.3 SGOT 16 SGPT 14 alkaline phosphatase 66 total bilirubin 0.5.  Only 2 weeks ago on July 14, 2018: A comprehensive metabolic panel showed sodium 142 potassium 4.7 chloride 104 CO2 25 glucose 89 BUN 22 creatinine 1.43 calcium 9.2.  A serum protein electrophoresis and immunofixation was performed on September 16.  The serum protein electrophoresis revealed a normal electrophoresis with an IgG kappa light chain specific paraprotein by immunofixation.  Quantitative immunoglobulins: IgG 998 [phone]); IgA 608 [phone]); IgM 56 (15-143).  A free kappa/lambda light chain analysis was not performed.  Likewise there was no 24-hour urine protein electrophoresis and immunofixation.  He has benign prostatic hypertrophy and 2-3 time nocturia overnight for the past 5 years. He admits to degenerative joint disease and pain in the knees and minimally neck.  At the time of his initial evaluation on October 9, we had a lengthy and detailed discussion with Alex Dunn and his wife Alex Dunn regarding the previous laboratory studies especially the complete blood count, comprehensive metabolic panel, serum protein electrophoresis, and immunofixation.  While he has an IgG a kappa light chain restricted paraprotein, its significance was unclear.  Although he appears to have improved slightly in terms of appetite and weight gain with prednisone over the past 4 weeks, corticosteroids can suppress a plasma cell dyscrasia, making the results  somewhat more difficult to interpret. For that reason, we did discuss the possible role, rationale, and expectation of a bone marrow aspiration and biopsy.  He has since discontinued prednisone completely over the past 2 days.     In the interim since his last visit, both his appetite and weight remain stable. He has no visual changes or hearing deficit.    He reports no rash or itching.  He denies any headache, dizziness, lightheadedness, syncope, near syncopal episodes.  There is no unusual cough, sore throat, or orthopnea.  He has no dyspnea at rest.  He is chronic but stable exertional dyspnea unchanged over his baseline.  He continues to complain of significant fatigue.  There is no pain or difficulty in swallowing.  No current fever, shaking chills, sweats, or flulike symptoms are evident.  The drenching night sweats have resolved completely. He has no heartburn or indigestion.  There is no nausea, vomiting, diarrhea, or constipation.  He generally moves his bowels once daily or every other day.  He denies melena or bright red blood per rectum. There is no urinary frequency, urgency, hematuria, or dysuria.  There are no new focal areas of bone, joint, muscle pain.  He reports no bleeding tendency or easy bruisability.  He has no numbness or tingling in the fingers or toes  His other comorbid problems include benign prostatic hypertrophy; diverticular disease; vitamin D deficiency on replacement; primary hypertension; positive double-stranded DNA (ANA titer elevated) on prednisone; a large right kidney cyst and smaller bilateral renal cysts; and chronic kidney disease.  He is a lifetime nondrinker and non-smoker.   Recommendation/Plan: We discussed in detail the results of his kappa/light chain analysis and urine free kappa and lambda light chain analysis. 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  They are outlined above.  A  bone marrow aspiration and biopsy for morphologic  analysis, immunophenotypic flow cytometry, conventional cytogenetics, and fluorescent in situ hybridization/molecular profiling were requested.  Those arrangements are underway.  In addition, special staining with Congo red for amyloid and iron was recommended.  We discussed in more lengthy detail the role, rationale, and mechanics of a bone marrow aspiration and biopsy.  Questions were answered to their satisfaction.  Although previously requested, a bone metastatic bone survey was not performed.  It has been re-requested.  Barring any unforeseen complications, his next scheduled doctor visit to discuss those results is now scheduled on November 4.  He was advised to call us in the interim should any new or untoward problems arise.  The total time spent discussing the most recent laboratory studies, their potential significance, role, rationale, and mechanics of a bone marrow aspiration and biopsy was 40 minutes.  At least 50% of that time was spent in face-to-face discussion, counseling, and answering questions. All questions were answered to his satisfaction.   This note was dictated using voice activated technology/software.  Unfortunately, typographical errors are not uncommon, and transcription is subject to mistakes and regrettably misinterpretation.  If necessary, clarification of the above information can be discussed with me at any time.  FOLLOW UP: AS DIRECTED   cc:   Riki Sheer, DO          Sarina Ill MD         Bo Merino, MD         Bluford Kaufmann, MD   Henreitta Leber, MD  Hematology/Oncology [place] [address] [place], [place] [address] Office: [phone] BJYN: [phone]

## 2018-08-24 NOTE — Patient Instructions (Addendum)
Keep up the good work.  Let me know if you need refills of prednisone.   Stay active.  Because your blood pressure is well-controlled, you no longer have to check your blood pressure at home anymore unless you wish. Some people check it twice daily every day and some people stop altogether. Either or anything in between is fine. Strong work!  Check with the heme/onc team tomorrow if they are OK with the flu shot and prednisone. If we come off of the prednisone, I would like you to come in after 1 week of being off of it for a flu shot.   Let us know if you need anything.

## 2018-08-24 NOTE — Progress Notes (Signed)
Pre visit review using our clinic review tool, if applicable. No additional management support is needed unless otherwise documented below in the visit note. 

## 2018-08-25 ENCOUNTER — Inpatient Hospital Stay (HOSPITAL_BASED_OUTPATIENT_CLINIC_OR_DEPARTMENT_OTHER): Payer: Medicare Other | Admitting: Hematology and Oncology

## 2018-08-25 ENCOUNTER — Telehealth: Payer: Self-pay | Admitting: Hematology and Oncology

## 2018-08-25 ENCOUNTER — Encounter: Payer: Self-pay | Admitting: Hematology and Oncology

## 2018-08-25 VITALS — BP 143/77 | HR 70 | Temp 98.6°F | Resp 17 | Ht 73.0 in | Wt 190.0 lb

## 2018-08-25 DIAGNOSIS — D472 Monoclonal gammopathy: Secondary | ICD-10-CM | POA: Diagnosis not present

## 2018-08-25 DIAGNOSIS — I129 Hypertensive chronic kidney disease with stage 1 through stage 4 chronic kidney disease, or unspecified chronic kidney disease: Secondary | ICD-10-CM

## 2018-08-25 DIAGNOSIS — R0609 Other forms of dyspnea: Secondary | ICD-10-CM

## 2018-08-25 DIAGNOSIS — E559 Vitamin D deficiency, unspecified: Secondary | ICD-10-CM

## 2018-08-25 DIAGNOSIS — R5383 Other fatigue: Secondary | ICD-10-CM

## 2018-08-25 DIAGNOSIS — N401 Enlarged prostate with lower urinary tract symptoms: Secondary | ICD-10-CM | POA: Diagnosis not present

## 2018-08-25 DIAGNOSIS — Z79899 Other long term (current) drug therapy: Secondary | ICD-10-CM | POA: Diagnosis not present

## 2018-08-25 DIAGNOSIS — N183 Chronic kidney disease, stage 3 unspecified: Secondary | ICD-10-CM

## 2018-08-25 DIAGNOSIS — N189 Chronic kidney disease, unspecified: Secondary | ICD-10-CM | POA: Diagnosis not present

## 2018-08-25 NOTE — Telephone Encounter (Signed)
Unable to schedule appt per 10/22 los - message sent to [facility] for next available bone marrow - will contact pt when appts are scheduled.

## 2018-08-25 NOTE — Patient Instructions (Signed)
We discussed in detail the results of your laboratory studies and 24-hour urine collection.  Those studies suggest an abnormal protein in quantity both within the blood and urine.  A bone survey has been requested through radiology at [facility].  We discussed in detail the role, rationale, and mechanics of a bone marrow aspiration and biopsy.  A bone marrow aspiration and biopsy has been requested.  Those arrangements are underway.  Barring any unforeseen complications, your next scheduled doctor visit is on November 4 to discuss those results.  Please do not hesitate to call if any questions arise in the interim.  Thank you! Ladona Ridgel, MD Hematology/Oncology

## 2018-08-26 ENCOUNTER — Telehealth: Payer: Self-pay | Admitting: Hematology and Oncology

## 2018-08-26 NOTE — Telephone Encounter (Signed)
Scheduled appt per 10/22 los / sch message - pt is aware of appts added.

## 2018-08-27 ENCOUNTER — Encounter: Payer: Medicare Other | Admitting: Neurology

## 2018-08-28 DIAGNOSIS — D472 Monoclonal gammopathy: Secondary | ICD-10-CM | POA: Diagnosis not present

## 2018-08-28 DIAGNOSIS — D72822 Plasmacytosis: Secondary | ICD-10-CM | POA: Diagnosis not present

## 2018-08-31 ENCOUNTER — Inpatient Hospital Stay: Payer: Medicare Other

## 2018-08-31 ENCOUNTER — Telehealth: Payer: Self-pay | Admitting: Adult Health

## 2018-08-31 ENCOUNTER — Inpatient Hospital Stay (HOSPITAL_BASED_OUTPATIENT_CLINIC_OR_DEPARTMENT_OTHER): Payer: Medicare Other | Admitting: Adult Health

## 2018-08-31 VITALS — BP 179/95 | HR 6 | Temp 98.6°F | Resp 18

## 2018-08-31 DIAGNOSIS — Z79899 Other long term (current) drug therapy: Secondary | ICD-10-CM | POA: Diagnosis not present

## 2018-08-31 DIAGNOSIS — I129 Hypertensive chronic kidney disease with stage 1 through stage 4 chronic kidney disease, or unspecified chronic kidney disease: Secondary | ICD-10-CM | POA: Diagnosis not present

## 2018-08-31 DIAGNOSIS — N401 Enlarged prostate with lower urinary tract symptoms: Secondary | ICD-10-CM | POA: Diagnosis not present

## 2018-08-31 DIAGNOSIS — D472 Monoclonal gammopathy: Secondary | ICD-10-CM

## 2018-08-31 DIAGNOSIS — E559 Vitamin D deficiency, unspecified: Secondary | ICD-10-CM | POA: Diagnosis not present

## 2018-08-31 DIAGNOSIS — D72822 Plasmacytosis: Secondary | ICD-10-CM | POA: Diagnosis not present

## 2018-08-31 DIAGNOSIS — N189 Chronic kidney disease, unspecified: Secondary | ICD-10-CM | POA: Diagnosis not present

## 2018-08-31 LAB — CBC WITH DIFFERENTIAL (CANCER CENTER ONLY)
ABS IMMATURE GRANULOCYTES: 0.02 10*3/uL (ref 0.00–0.07)
BASOS PCT: 1 %
Basophils Absolute: 0 10*3/uL (ref 0.0–0.1)
Eosinophils Absolute: 0.1 10*3/uL (ref 0.0–0.5)
Eosinophils Relative: 2 %
HCT: 44.4 % (ref 39.0–52.0)
HEMOGLOBIN: 14.4 g/dL (ref 13.0–17.0)
IMMATURE GRANULOCYTES: 0 %
LYMPHS PCT: 22 %
Lymphs Abs: 1.2 10*3/uL (ref 0.7–4.0)
MCH: 31.1 pg (ref 26.0–34.0)
MCHC: 32.4 g/dL (ref 30.0–36.0)
MCV: 95.9 fL (ref 80.0–100.0)
MONO ABS: 0.5 10*3/uL (ref 0.1–1.0)
MONOS PCT: 9 %
NEUTROS ABS: 3.6 10*3/uL (ref 1.7–7.7)
NEUTROS PCT: 66 %
PLATELETS: 169 10*3/uL (ref 150–400)
RBC: 4.63 MIL/uL (ref 4.22–5.81)
RDW: 12.6 % (ref 11.5–15.5)
WBC Count: 5.5 10*3/uL (ref 4.0–10.5)
nRBC: 0 % (ref 0.0–0.2)

## 2018-08-31 MED ORDER — LIDOCAINE HCL 2 % IJ SOLN
INTRAMUSCULAR | Status: AC
  Filled 2018-08-31: qty 20

## 2018-08-31 NOTE — Progress Notes (Signed)
INDICATION: MGUS rule out Multiple Myeloma and Amyloid   Bone Marrow Biopsy and Aspiration Procedure Note   Informed consent was obtained and potential risks including bleeding, infection and pain were reviewed with the patient.  The patient's name, date of birth, identification, consent and allergies were verified prior to the start of procedure and time out was performed.  The left posterior iliac crest was chosen as the site of biopsy.  The skin was prepped with ChloraPrep.   10 cc of 2% lidocaine was used to provide local anaesthesia.   10 cc of bone marrow aspirate was obtained followed by 1cm biopsy.  Pressure was applied to the biopsy site and bandage was placed over the biopsy site. Patient was made to lie on the back for 30 mins prior to discharge.  The procedure was tolerated well. COMPLICATIONS: None BLOOD LOSS: none The patient was discharged home in stable condition with a 1 week follow up to review results.  Patient was provided with post bone marrow biopsy instructions and instructed to call if there was any bleeding or worsening pain.  Specimens sent for flow cytometry, cytogenetics and additional studies.  Signed Scot Dock, NP

## 2018-08-31 NOTE — Progress Notes (Signed)
Bone Marrow Biopsy performed by Annabelle Harman.  Pt tolerated procedure well.  Site clean, dry and intact.  Pt educated on signs of infection. Pt BP elevated 179/95, pt states he believes it is due to stress and will re take it at home.  pt discharged home with wife.

## 2018-08-31 NOTE — Progress Notes (Signed)
Report given to RN by this nurse to check VS, bandage and send for CBC before patient leaves today.

## 2018-08-31 NOTE — Telephone Encounter (Signed)
No 10/28 los orders.

## 2018-08-31 NOTE — Patient Instructions (Signed)
Bone Marrow Aspiration and Bone Marrow Biopsy, Adult, Care After This sheet gives you information about how to care for yourself after your procedure. Your health care provider may also give you more specific instructions. If you have problems or questions, contact your health care provider. What can I expect after the procedure? After the procedure, it is common to have:  Mild pain and tenderness.  Swelling.  Bruising.  Follow these instructions at home:  Take over-the-counter or prescription medicines only as told by your health care provider.  Do not take baths, swim, or use a hot tub until your health care provider approves. Ask if you can take a shower or have a sponge bath.  Follow instructions from your health care provider about how to take care of the puncture site. Make sure you: ? Wash your hands with soap and water before you change your bandage (dressing). If soap and water are not available, use hand sanitizer. ? Change your dressing as told by your health care provider.  Check your puncture siteevery day for signs of infection. Check for: ? More redness, swelling, or pain. ? More fluid or blood. ? Warmth. ? Pus or a bad smell.  Return to your normal activities as told by your health care provider. Ask your health care provider what activities are safe for you.  Do not drive for 24 hours if you were given a medicine to help you relax (sedative).  Keep all follow-up visits as told by your health care provider. This is important. Contact a health care provider if:  You have more redness, swelling, or pain around the puncture site.  You have more fluid or blood coming from the puncture site.  Your puncture site feels warm to the touch.  You have pus or a bad smell coming from the puncture site.  You have a fever.  Your pain is not controlled with medicine. This information is not intended to replace advice given to you by your health care provider. Make sure  you discuss any questions you have with your health care provider. Document Released: 05/10/2005 Document Revised: 05/10/2016 Document Reviewed: 04/03/2016 Elsevier Interactive Patient Education  2018 [employer].

## 2018-09-01 ENCOUNTER — Encounter

## 2018-09-01 ENCOUNTER — Ambulatory Visit: Payer: Medicare Other | Admitting: Neurology

## 2018-09-02 ENCOUNTER — Ambulatory Visit (HOSPITAL_COMMUNITY)
Admission: RE | Admit: 2018-09-02 | Discharge: 2018-09-02 | Disposition: A | Discharge status: Home / Self Care | Payer: Medicare Other | Source: Ambulatory Visit | Attending: Hematology and Oncology | Admitting: Hematology and Oncology

## 2018-09-02 DIAGNOSIS — N183 Chronic kidney disease, stage 3 unspecified: Secondary | ICD-10-CM

## 2018-09-02 DIAGNOSIS — D472 Monoclonal gammopathy: Secondary | ICD-10-CM | POA: Diagnosis not present

## 2018-09-02 DIAGNOSIS — R937 Abnormal findings on diagnostic imaging of other parts of musculoskeletal system: Secondary | ICD-10-CM | POA: Insufficient documentation

## 2018-09-07 ENCOUNTER — Encounter: Payer: Self-pay | Admitting: Hematology and Oncology

## 2018-09-07 ENCOUNTER — Inpatient Hospital Stay: Payer: Medicare Other | Attending: Hematology and Oncology | Admitting: Hematology and Oncology

## 2018-09-07 ENCOUNTER — Telehealth: Payer: Self-pay | Admitting: Hematology and Oncology

## 2018-09-07 VITALS — BP 167/76 | HR 64 | Temp 98.5°F | Resp 17 | Ht 73.0 in | Wt 192.2 lb

## 2018-09-07 DIAGNOSIS — I129 Hypertensive chronic kidney disease with stage 1 through stage 4 chronic kidney disease, or unspecified chronic kidney disease: Secondary | ICD-10-CM | POA: Insufficient documentation

## 2018-09-07 DIAGNOSIS — R0609 Other forms of dyspnea: Secondary | ICD-10-CM

## 2018-09-07 DIAGNOSIS — D472 Monoclonal gammopathy: Secondary | ICD-10-CM | POA: Diagnosis not present

## 2018-09-07 DIAGNOSIS — E559 Vitamin D deficiency, unspecified: Secondary | ICD-10-CM | POA: Diagnosis not present

## 2018-09-07 DIAGNOSIS — N189 Chronic kidney disease, unspecified: Secondary | ICD-10-CM | POA: Diagnosis not present

## 2018-09-07 DIAGNOSIS — G8929 Other chronic pain: Secondary | ICD-10-CM

## 2018-09-07 NOTE — Patient Instructions (Signed)
We discussed the results of your bone marrow aspiration and biopsy.  There is evidence of an increase in plasma cells which may be reactive or secondary.  It may also be an early plasma cell disorder.  The results of your bone survey failed to reveal any evidence of bone destruction.  At the present time, it is recommended that we continue to follow your protein studies and kidney function, making adjustments as necessary.  This is especially prudent given the fact that you are feeling better and no longer having night sweats.  Additionally you are currently not on prednisone.  Repeat laboratory studies were requested for December 2.  Barring any unforeseen complications, your next scheduled doctor visit to discuss those results is on October 12, 2018.  Please do not hesitate to call if any questions arise in the interim.  Ladona Ridgel, MD Hematology/Oncology

## 2018-09-07 NOTE — Telephone Encounter (Signed)
Appts scheduled avs/calendar printed per 11/4 los °

## 2018-09-07 NOTE — Progress Notes (Signed)
Hematology/Oncology Outpatient Progress Note  Patient Name:  Alex Dunn  DOB: 31-May-1936   Date of Service: September 07, 2018  Referring Provider: Riki Sheer, [address] [place], [place] [address]   Consulting Physician: Henreitta Leber, MD Hematology/Oncology  Reason for Visit: In the setting of a monoclonal IgG kappa paraproteinemia, he presents now for the results of a bone marrow aspiration and biopsy on October 28, and recommendations.  Brief History: Alex Dunn is an 82 year old resident of [facility] whose past medical history is significant for benign prostatic hypertrophy; diverticular disease; vitamin D deficiency on replacement; primary hypertension; positive double-stranded DNA (ANA titer elevated) on prednisone; a large right kidney cyst and smaller bilateral renal cysts; and chronic kidney disease.  He is a lifetime nondrinker and non-smoker.  He is accompanied by his attentive wife, Dot.  His primary care physician is Dr. Riki Sheer.  Beginning in March 2019, he developed drenching night sweats on a once weekly basis, generalized myalgias with extreme fatigue.  He was seen and evaluated at the emergency department in [place] where a viral syndrome was suspected.  He denied any cough, fever or gastrointestinal complaints. Although his energy level never improved, the night sweats dissipated and he again was seen and evaluated in the emergency department in May complaining of shakiness, night sweats, generalized myalgias, and fatigue.  Over the past 4 months his appetite has declined and he has lost 20 pounds.  No skin lesions were identified.  There was no itching..  On May 03, 2018 through his primary care physician, CT imaging of the abdomen and pelvis wwas performed.  There was no acute process within the abdomen or pelvis.  Bilateral renal cyst however were noted measuring up to 7 cm within the superior pole of the right kidney.   Additional smaller cyst were seen within the left and right kidneys.  Those detailed results are outlined below.   Subsequently, he was seen and evaluated by a number of subspecialists including Dr. Bo Merino, rheumatology.  On July 3, a lengthy immunologic profile was requested. Those results are detailed below.  His ANA titer was relatively low, while his double-stranded DNA was positive.  He was started on prednisone 10 mg  It was subsequently tapered to prednisone: 2.5 mg once daily. He continues on prednisone to the present.  He was referred thereafter to Janene Madeira, infectious disease nurse practitioner.  There was no evidence of active infection.  He was seen also by the nephrology service.  Those records are not available on the existing chart.  He was referred also to Dr. Sarina Ill, neurology, when a monoclonal IgA kappa chain paraprotein was identified in the blood. Quantitative light chains and Bence-Jones proteins were not evaluated.  On May 03, 2018: A complete blood count showed hemoglobin 14.0 hematocrit 42.1 MCV 92.2 MCH 31.7 RDW 13.3 WBC 6.1 with 67% neutrophils 23% lymphocytes 9% monocytes 1% eosinophil; platelets 167,000.  On the same day, a comprehensive metabolic panel showed sodium 142 potassium 4.4 chloride 107 CO2 29 glucose 103 BUN 21 creatinine 1.5 total protein 6.2 calcium 9.1 albumin 3.3 SGOT 16 SGPT 14 alkaline phosphatase 66 total bilirubin 0.5.  Only 2 weeks ago on July 14, 2018: A comprehensive metabolic panel showed sodium 142 potassium 4.7 chloride 104 CO2 25 glucose 89 BUN 22 creatinine 1.43 calcium 9.2.  A serum protein electrophoresis and immunofixation was performed on September 16.  The serum protein electrophoresis revealed a normal electrophoresis with  an IgG kappa light chain specific paraprotein by immunofixation.  Quantitative immunoglobulins: IgG 998 ([phone]); IgA 608 [phone]); IgM 56 (15-143).  A free kappa/lambda light chain analysis was  not performed.  Likewise there was no 24-hour urine protein electrophoresis and immunofixation.  He has benign prostatic hypertrophy and 2-3 time nocturia overnight for the past 5 years. He admits to degenerative joint disease and pain in the knees and minimally neck.  At the time of his initial evaluation on October 9, we had a lengthy and detailed discussion with Alex Dunn and his wife Dot regarding the previous laboratory studies especially the complete blood count, comprehensive metabolic panel, serum protein electrophoresis, and immunofixation.  While he has an IgG a kappa light chain restricted paraprotein, its significance was unclear.  Although he appears to have improved slightly in terms of appetite and weight gain with prednisone over the past 80 weeks, corticosteroids can suppress a plasma cell dyscrasia, making the results somewhat more difficult to interpret.   For that reason, we did discuss the possible role, rationale, and expectation of a bone marrow aspiration and biopsy.  He has since discontinued prednisone completely over the past 2 days. Additional laboratory studies were obtained to include kappa/lambda light chain analysis, beta-2 microglobulin, and a 24-hour urine protein electrophoresis and immunofixation. Those results are detailed below.    On October 28, a bone marrow aspiration biopsy was done for morphologic evaluation, immunophenotypic flow cytometry, conventional cytogenetics, and molecular profiling.  All of those results are not yet available.  Morphologically the peripheral blood was unremarkable.  The bone marrow aspirate was normocellular with a monoclonal plasmacytosis and mild increase in plasma cells (8% aspirate; 10-15% CD 138 immunohistochemistry).  These findings are consistent with a plasma cell neoplasm.  Congo red stain for amyloid is pending.  Iron stores were present.  There were no ringed sideroblasts.  Conventional cytogenetics and fluorescence in situ  hybridization was requested and is pending. It is with this background he presents now for the results of the bone marrow aspiration and biopsy and recommendations.  Interval History: In the interim since his last visit, both his appetite and weight remain stable.  Since discontinuing prednisone, he feels better. He has no visual changes or hearing deficit. He reports no rash or itching.  He denies any headache, dizziness, lightheadedness, syncope, near syncopal episodes. There is no unusual cough, sore throat, or orthopnea.  He has no dyspnea at rest.  He is chronic but stable exertional dyspnea unchanged over his baseline.  He continues to complain of significant slightly improved.  There is no pain or difficulty in swallowing.  No current fever, shaking chills, sweats, or flulike symptoms are evident. The drenching night sweats have resolved completely. He has no heartburn or indigestion. There is no nausea, vomiting, diarrhea, or constipation.  He generally moves his bowels once daily or every other day.  He denies melena or bright red blood per rectum. There is no urinary frequency, urgency, hematuria, or dysuria.  There are no new focal areas of bone, joint, muscle pain.  He reports no bleeding tendency or easy bruisability.  He has no numbness or tingling in the fingers or toes.   Past Medical History Reviewed        Family History Reviewed       Social History Reviewed  Past Medical History:  Diagnosis Date  . ANEMIA-IRON DEFICIENCY 06/23/2007  . BENIGN PROSTATIC HYPERTROPHY 06/23/2007  . Diverticulosis   . GERD 06/23/2007  . Hypertension   .  Polycystic kidney disease    stage 3 per wife   Allergies  Allergen Reactions  . Codeine Phosphate Other (See Comments)    Seen odd things crawling up wall  . Lexapro [Escitalopram Oxalate] Other (See Comments)    shaking   Current Outpatient Medications on File Prior to Visit  Medication Sig  . pantoprazole (PROTONIX) 40 MG tablet Take 1  tablet (40 mg total) by mouth daily as needed. (Patient taking differently: Take 40 mg by mouth daily as needed (heartburn). )  . predniSONE (DELTASONE) 5 MG tablet Take 2.5 mg by mouth daily with breakfast.    No current facility-administered medications on file prior to visit.     Review of Systems: Constitutional: No fever; previous drenching sweats, now dissipated; no shaking chills.  Appetite decline and 20 pound weight loss over the past 4 months, now improving; energy level low. Skin: No rash, scaling, sores, lumps, or jaundice. HEENT: No visual changes or hearing deficit; he wears corrective lens. Pulmonary: No unusual cough, sore throat, or orthopnea; no allergic rhinitis or chronic sinusitis. Cardiovascular: No coronary artery disease, angina, or myocardial infarction.  No cardiac dysrhythmia; essential hypertension.No dyslipidemia. Gastrointestinal: No indigestion, dysphagia, abdominal pain, diarrhea, or constipation.  No change in bowel habits.  No nausea or vomiting.  No melena or bright red blood per rectum. Genitourinary: No frequency, urgency, hematuria, or dysuria; nocturia 3 times nightly; benign prostatic hypertrophy. Musculoskeletal: Degenerative joint disease involving the knees and neck.  No new arthralgias or myalgias; no joint swelling, pain, or instability. Hematologic: No bleeding tendency or easy bruisability; IgG kappa light chain paraproteinemia. Endocrine: No intolerance to heat or cold; no thyroid disease or diabetes mellitus. Vascular: No peripheral arterial or venous thromboembolic disease. Psychological: No anxiety, depression, or mood changes; no mental health illnesses. Neurological: No dizziness, lightheadedness, syncope, or near syncopal episodes; no numbness or tingling in the fingers or toes.  No seizure disorder or stroke syndrome.  Physical Examination: Vital Signs: Body surface area is 2.12 meters squared.  Vitals:   09/07/18 1036  BP: (!) 167/76    Pulse: 64  Resp: 17  Temp: 98.5 F (36.9 C)  SpO2: 100%    Filed Weights   09/07/18 1036  Weight: 192 lb 3.2 oz (87.2 kg)  Body mass index is 25.36 kg/m. ECOG PERFORMANCE STATUS: 1 Constitutional:  Alex Dunn is fully nourished and developed.  He looks age appropriate.  He is friendly and cooperative without respiratory compromise at rest. Skin: No rashes, scaling, dryness, jaundice, or itching. HEENT: Head is normocephalic and atraumatic.  Pupils are equal round and reactive to light and accommodation.  Sclerae are anicteric.  Conjunctivae are pink.  No sinus tenderness nor oropharyngeal lesions.  Lips without cracking or peeling; tongue without mass, inflammation, or nodularity.  Mucous membranes are moist. Neck: Supple and symmetric.  No jugular venous distention or thyromegaly.  Trachea is midline. Lymphatics: No cervical or supraclavicular lymphadenopathy.  No epitrochlear, axillary, or inguinal lymphadenopathy is appreciated. Respiratory/chest: Thorax is symmetrical.  Breath sounds are clear to auscultation and percussion.  Normal excursion and respiratory effort. Back: Symmetric without deformity or tenderness. Cardiovascular: Heart rate and rhythm are regular without murmurs. Gastrointestinal: Abdomen is soft, nontender; no organomegaly.  Bowel sounds are normoactive.  No masses are appreciated. Extremities: In the lower extremities, there is no asymmetric swelling, erythema, tenderness, or cord formation.  No clubbing, cyanosis, nor edema. Hematologic: No petechiae, hematomas, or ecchymoses. Psychological:  He is oriented to person, place, and  time; normal affect, memory, and cognition. Neurological: There are no gross neurologic deficits.  Laboratory Results: August 12, 2018  Ref Range & Units 12d ago  WBC Count 4.0 - 10.5 K/uL 7.3   RBC 4.22 - 5.81 MIL/uL 4.43   Hemoglobin 13.0 - 17.0 g/dL 14.0   HCT 39.0 - 52.0 % 42.8   MCV 80.0 - 100.0 fL 96.6   MCH 26.0 - 34.0 pg  31.6   MCHC 30.0 - 36.0 g/dL 32.7   RDW 11.5 - 15.5 % 12.7   Platelet Count 150 - 400 K/uL 168   nRBC 0.0 - 0.2 % 0.0   Neutrophils Relative % % 71   Neutro Abs 1.7 - 7.7 K/uL 5.2   Lymphocytes Relative % 20   Lymphs Abs 0.7 - 4.0 K/uL 1.5   Monocytes Relative % 7   Monocytes Absolute 0.1 - 1.0 K/uL 0.5   Eosinophils Relative % 1   Eosinophils Absolute 0.0 - 0.5 K/uL 0.0   Basophils Relative % 0   Basophils Absolute 0.0 - 0.1 K/uL 0.0   Immature Granulocytes % 1   Abs Immature Granulocytes 0.00 - 0.07 K/uL 0.04    CMP Latest Ref Rng & Units 08/12/2018 07/20/2018 05/03/2018  Glucose 70 - 99 mg/dL 96 89 103(H)  BUN 8 - 23 mg/dL '20 22 21  '$ Creatinine 0.61 - 1.24 mg/dL 1.38(H) 1.43(H) 1.50(H)  Sodium 135 - 145 mmol/L 141 142 142  Potassium 3.5 - 5.1 mmol/L 4.3 4.7 4.4  Chloride 98 - 111 mmol/L 106 104 107  CO2 22 - 32 mmol/L '27 25 29  '$ Calcium 8.9 - 10.3 mg/dL 9.0 9.2 9.1  Total Protein 6.5 - 8.1 g/dL 6.9 6.6 6.2(L)  Total Bilirubin 0.3 - 1.2 mg/dL 0.4 - 0.5  Alkaline Phos 38 - 126 U/L 75 - 66  AST 15 - 41 U/L 15 - 16  ALT 0 - 44 U/L 13 - 14  Reticulocyte count 0.8% LDH 136 Beta-2 microglobulin 3.2 (0.6-2.4) Serum kappa/lambda light chain analysis: Free kappa light chain 48.9 (3.3-19.4) Free lambda light chain 18.1 (5.7-26.3) Free kappa/lambda light chain ratio 2.7 (0.26-1.65) Reticulocyte count 0.8% Beta-2 microglobulin 3.2 LDH 136 24-hour Urine kappa/lambda light chain analysis: Free kappa 64.70 Free lambda 2.82 Free kappa/lambda light chain ratio (urine) 22.94 (2.04-10.37).  July 20, 2018  Ref Range & Units 360moago 368mogo 60m56moo 81mo281mo 48mo581mo IgG (Immunoglobin G), Serum 700 - 1,600 mg/dL 998       IgA/Immunoglobulin A, Serum 61 - 437 mg/dL 608High        IgM (Immunoglobulin M), Srm 15 - 143 mg/dL 56       Total Protein 6.0 - 8.5 g/dL 6.6  6.2Low  R 6.9 R 7.3 R 6.7 R  Albumin SerPl Elph-Mcnc 2.9 - 4.4 g/dL 3.3       Alpha 1 0.0 - 0.4 g/dL 0.2       Alpha2  Glob SerPl Elph-Mcnc 0.4 - 1.0 g/dL 0.8       B-Globulin SerPl Elph-Mcnc 0.7 - 1.3 g/dL 1.2       Gamma Glob SerPl Elph-Mcnc 0.4 - 1.8 g/dL 1.1       M Protein SerPl Elph-Mcnc Not Observed g/dL 0.3High        Globulin, Total 2.2 - 3.9 g/dL 3.3       Albumin/Glob SerPl 0.7 - 1.7 1.1       IFE 1  Comment       Comment:  Immunofixation shows IgA monoclonal protein with kappa light chain  specificity.     Component Latest Ref Rng & Units 05/06/2018  EBV VCA IgM U/mL <36.00  EBV VCA IgG U/mL 119.00 (H)  EBV NA IgG U/mL 275.00 (H)  Interpretation    Hemoglobin A1C <5.7 % of total Hgb 5.6  Mean Plasma Glucose (calc) 114  eAG (mmol/L) (calc) 6.3  Hepatitis B Surface Ag NON-REACTI NON-REACTIVE  C206 ACTH 6 - 50 pg/mL 13  Cortisol - AM mcg/dL 8.7  RA Latex Turbid. <14 IU/mL <14  Compl, Total (CH50) 31 - 60 U/mL >60 (H)   Component Latest Ref Rng & Units 04/16/2018  Ribonucleic Protein 0.0 - 0.9 AI <0.2  SSA (Ro) (ENA) Antibody, IgG 0.0 - 0.9 AI <0.2  Scleroderma (Scl-70) (ENA) Antibody, IgG 0.0 - 0.9 AI <0.2  ENA SM Ab Ser-aCnc 0.0 - 0.9 AI <0.2  SSB (La) (ENA) Antibody, IgG 0.0 - 0.9 AI <0.2  ds DNA Ab 0 - 9 IU/mL 32 (H)  Homogeneous Pattern  1:80  NOTE:  Comment  ANA Ab, IFA  Positive (A)  Anti JO-1 0.0 - 0.9 AI <0.2  B burgdorferi Ab IgG+IgM 0.00 - 0.90 ISR <0.91   Component Latest Ref Rng & Units 04/15/2018  CK Total 44 - 196 U/L 40 (L)  CK, MB 0 - 5.0 ng/mL 1.3  Relative Index 0 - 4.0 3.3  Hepatitis C Ab NON-REACTI NON-REACTIVE  SIGNAL TO CUT-OFF <1.00 0.02  HIV NON-REACTI NON-REACTIVE  RPR NON-REACTI NON-REACTIVE  C-Reactive Protein, Cardiac 0.000 - 5.000 mg/L 1.520  T4,Free(Direct) 0.60 - 1.60 ng/dL 0.89   04/15/2018: CK 40, hep C negative, HIV negative, RPR negative, CRP 1.520, T4 0.89 04/06/2018 RNP  negative, Ro-, La -, Scl-70 -, Smith negative, dsDNA 32, anti-Jo 1 negative, burgdorferi antibody negative  05/06/18: EBV IgM negative, Hgb A1c 5.6, Hep B -, ACTH 13, Cortisol 8.7, RF <14, CH50 >60, ANA 1:80 Homogenous May 2019 ESR 6, PPD negative March 2019 vitamin D 16.71  Positive ANA (antinuclear antibody) - ANA 1:80 Homogenous, RF -, Ro-, La-, smith -, RNP-, CH50 >60, CK 40, dsDNA 32, Anti-Jo 1 negative, Scl-70 negative. He complains of fatigue and pain in his bilateral knee joints. He has no other clinical features of autoimmune disease. All his work-up by infectious disease nephrology and cardiology has been negative. I will obtain following labs today. I will also start him on low-dose prednisone as he is feeling quite uncomfortable.- Plan: TSH, VITAMIN D 25 Hydroxy (Vit-D Deficiency, Fractures), ANA, Pan-ANCA, C3 and C4, Cardiolipin antibodies, IgG, IgM, IgA, Lupus Anticoagulant Eval w/Reflex, Beta-2 glycoprotein antibodies, Cyclic citrul peptide antibody, IgG, Glucose 6 phosphate dehydrogenase, CK, Parvovirus B19 antibody, IgG and IgM.  Positive double stranded DNA antibody test  Diagnostic/Imaging Studies: On September 02, 2018 METASTATIC BONE SURVEY  COMPARISON:  None.  FINDINGS: Metastatic bone survey was performed.  Lateral view of the skull demonstrates no definitive lytic lesions. Prominent venous channels are seen.  The cervical spine demonstrates multilevel degenerative changes. Central Schmorl's nodes are noted at multiple cervical levels. Mild anterolisthesis of a degenerative nature at C6-7 is noted.  Thoracic spine also demonstrates multiple osteophytic changes. No compression deformity is seen. No pedicle abnormality or paraspinal mass is noted.  Lumbar spine demonstrates mild osteophytic change. No compression deformity is seen.  Frontal view of the pelvis shows no definitive lytic lesions.  The lower extremities are well visualized bilaterally  without definitive lytic or sclerotic lesions. The upper extremities also  demonstrate no focal abnormality.  Chest demonstrates no focal infiltrate. Cardiac shadow is within normal limits. Mild aortic calcifications are seen. No rib abnormality is noted.  IMPRESSION: Chronic appearing changes without definitive lytic or sclerotic lesions.  Inez Catalina M.D. 09/02/2018 14:41  July 28, 2018 MRI Brain with and without contrast  ORDERING CLINICIAN: Sarina Ill, MD  TECHNIQUE:MRI of the brain with and without contrast was obtained utilizing 5 mm axial slices with T1, T2, T2 flair, SWI and diffusion weighted views. T1 sagittal, T2 coronal and postcontrast views in the axial and coronal plane were obtained.  CONTRAST: 17 ml Multihance  IMAGING SITE: [facility], [place].  FINDINGS: On sagittal images, the spinal cord is imaged caudally to C3-C4and is normal in caliber. The contents of the posterior fossa are of normal size and position. The pituitary gland and optic chiasm appear normal. There is mild generalized cortical atrophy.There are no abnormal extra-axial collections of fluid.   The cerebellum and brainstem appears normal. The deep gray matter appears normal. There are T2/FLAIR hyperintense foci in the periventricular and deep white matter of the hemispheres consistent with mild chronic microvascular ischemic change. None of these appear to be acute.. Diffusion weighted images are normal. Susceptibility weighted images are normal.   The orbits appear normal. The VIIth/VIIIth nerve complex appears normal. The mastoid air cells appear normal. The paranasal sinuses appear normal. Flow voids are identified within the major intracerebral arteries. Incidental note is made of dolichoectasia of the vertebrobasilar arteries and the cavernous segments of the internal carotid arteries.   After the infusion of contrast material,  a normal enhancement pattern is noted.  IMPRESSION: This MRI of the brain with and without contrast shows the following: 1. Mild generalized cortical atrophy. 2. T2/FLAIR hyperintense foci of the hemispheres consistent with mild chronic microvascular ischemic changes 3. Vertebrobasilar and internal carotid artery dolichoectasia. 4. There are no acute findings and there is a normal enhancement pattern.  Leemon Ayala A. Felecia Shelling, MD, PhD, FAAN Certified in [place] by [employer]  July 28, 2018 MRI of the cervical spine without intravenous contrast  ORDERING CLINICIAN: Sarina Ill, MD  TECHNIQUE: MRI of the cervical spine was obtained utilizing 3 mm sagittal slices from the posterior fossa down to the T3-4 level with T1, T2 and inversion recovery views. In addition 4 mm axial slices from M0-1 down to T1-2 level were included with T2 and gradient echo views.  CONTRAST: None IMAGING SITE: [facility], [place], [place], [place]  FINDINGS: : On sagittal images, the spine is imaged from above the cervicomedullary junction to T2. Vertebrobasilar dolichoectasia is noted. The spinal cord is of normal caliber and signal.   There is 2 mm of anterolisthesis of C7 upon T1. There are chronic endplate fractures of the C3-C6 vertebral bodies, likely osteoporotic.. The vertebral bodies have normal signal.   The discs and interspaces were further evaluated on axial views from C2 to T1 as follows:  C2-C3: There is mild right facet hypertrophy. There is no foraminal narrowing or spinal stenosis. There is no nerve root compression.  C3-C4: The central canal is narrowed but not enough to be considered spinal stenosis due to disc extrusion, uncovertebral spurring, facet hypertrophy and ligamentum flavum hypertrophy. There is mild left and mild to moderate right foraminal narrowing but there does not appear to be nerve root  compression.  C4-C5: There is disc bulging, facet hypertrophy and uncovertebral spurring causing mild to moderate bilateral foraminal narrowing but  no nerve root compression.  C5-C6: There is mild spinal stenosis due to combination of disc protrusion, uncovertebral spurring and facet hypertrophy. There is mild to moderate bilateral foraminal narrowing but no nerve root compression.  C6-C7: There is disc bulging causing mild foraminal narrowing but no nerve root compression.  C7-T1: There is 2 mm of anterolisthesis associated with facet hypertrophy and disc bulging. There does not appear to be spinal stenosis or nerve root compression.  IMPRESSION: This MRI of the cervical spine without contrast shows the following: 1. The spinal cord appears normal. 2. At C3-C4, there is borderline spinal stenosis due to degenerative changes also causing mild to moderate foraminal narrowing. There is no nerve root compression. 3. At C4-C5, there is mild spinal stenosis due to degenerative changes also causing mild to moderate foraminal narrowing. There is no nerve root compression. 4. At C7-T1, there is minimal anterolisthesis, facet hypertrophy and disc bulging. There is no spinal stenosis or nerve root compression. 5. There are milder degenerative changes at the other cervical levels as detailed above that do not lead to spinal stenosis or nerve root compression. 6. There are endplate deformations involving the C3-C6 vertebral bodies most consistent with chronic osteoporotic endplate fractures. 7. There are no acute findings.  Natarsha Hurwitz A. Felecia Shelling, MD, PhD, [facility]  May 03, 2018 CT ABDOMEN AND PELVIS WITH CONTRAST  TECHNIQUE: Multidetector CT imaging of the abdomen and pelvis was performed using the standard protocol following bolus administration of intravenous contrast.  CONTRAST: 60m ISOVUE-300 IOPAMIDOL (ISOVUE-300) INJECTION 61%  COMPARISON: CT abdomen  pelvis 03/19/2018  FINDINGS: Lower chest: Normal heart size. Dependent atelectasis right lower lobe. No pleural effusion.  Hepatobiliary: Liver is normal in size and contour. No focal lesion is identified. Gallbladder is unremarkable. No intrahepatic or extrahepatic biliary ductal dilatation.  Pancreas: Unremarkable  Spleen: Unremarkable  Adrenals/Urinary Tract: Adrenal glands are normal. Kidneys enhance symmetrically with contrast. Bilateral renal cysts measuring up to 7 cm within the superior pole of the right kidney. Additional smaller cysts are demonstrated within the right and left kidneys. Additionally, there are too small to characterize subcentimeter low-attenuation lesions. Urinary bladder is unremarkable.  Stomach/Bowel: Sigmoid colonic diverticulosis. No CT evidence for acute diverticulitis. Normal appendix. No evidence for small bowel obstruction. No free fluid or free intraperitoneal air. Descending duodenum diverticulum.  Vascular/Lymphatic: Normal caliber abdominal aorta. Peripheral calcified atherosclerotic plaque. No retroperitoneal lymphadenopathy.  Reproductive: Prostate unremarkable.  Other: Small bilateral fat containing inguinal hernias, left-greater-than-right.  Musculoskeletal: Lumbar spine degenerative changes. No aggressive or acute appearing osseous lesions.  IMPRESSION: 1. No acute process within the abdomen or pelvis. 2. Heterogeneous opacities right lower lobe favored to represent atelectasis.  DLovey NewcomerM.D. 05/03/2018 12:21  Summary/Assessment: In the setting of a monoclonal IgG kappa paraproteinemia, he presents now for the results of a bone marrow aspiration and biopsy on October 28, and recommendations.  Beginning in March 2019, he developed drenching night sweats on a once weekly basis, generalized myalgias with extreme fatigue.  [place].  He denied any cough, fever or gastrointestinal complaints. Although his energy level never improved, the night sweats dissipated and he again was seen and evaluated in the emergency department in May complaining of shakiness, night sweats, generalized myalgias, and fatigue.    Over the past 4 months his appetite has declined and he has lost 20 pounds.  No skin lesions were identified. On May 03, 2018 through  his primary care physician, CT imaging of the abdomen and pelvis wwas performed.  There was no acute process within the abdomen or pelvis.  Bilateral renal cyst however were noted measuring up to 7 cm within the superior pole of the right kidney.  Additional smaller cyst were seen within the left and right kidneys.  Those detailed results are outlined below.   Subsequently, he was seen and evaluated by a number of subspecialists including Dr. Bo Merino, rheumatology.  On July 3, a lengthy immunologic profile was requested. Those results are detailed below.  His ANA titer was relatively low, while his double-stranded DNA was positive. He was started on prednisone 10 mg  It was subsequently tapered to prednisone: 2.5 mg once daily.  He stopped prednisone at least 2 weeks ago. He was referred also to Dr. Sarina Ill, neurology, when a monoclonal IgA kappa chain paraprotein was identified in the blood. Quantitative light chains and Bence-Jones proteins were not evaluated.  A serum protein electrophoresis and immunofixation was performed on September 16.  The serum protein electrophoresis revealed a normal electrophoresis with an IgG kappa light chain specific paraprotein by immunofixation.  Quantitative immunoglobulins: IgG 998 ([phone]); IgA 608 [phone]); IgM 56 ([phone]).  A free kappa/lambda light chain analysis was not performed.  Likewise there was no 24-hour urine protein electrophoresis and immunofixation.  He has benign prostatic hypertrophy and 2-3 time nocturia overnight  for the past 5 years. He admits to degenerative joint disease and pain in the knees and minimally neck.  At the time of his initial evaluation on October 9, we had a lengthy and detailed discussion with Demarian and his wife Dot regarding the previous laboratory studies especially the complete blood count, comprehensive metabolic panel, serum protein electrophoresis, and immunofixation.  While he has an IgG a kappa light chain restricted paraprotein, its significance was unclear.  Although he appears to have improved slightly in terms of appetite and weight gain with prednisone over the past 80 weeks, corticosteroids can suppress a plasma cell dyscrasia, making the results somewhat more difficult to interpret. For that reason, we did discuss the possible role, rationale, and expectation of a bone marrow aspiration and biopsy.  He has since discontinued prednisone completely over the past 2 days.     On October 28, a bone marrow aspiration biopsy was done for morphologic evaluation, immunophenotypic flow cytometry, conventional cytogenetics, and molecular profiling.  All of those results are not yet available.  Morphologically the peripheral blood was unremarkable.  The bone marrow aspirate was normocellular with a monoclonal plasmacytosis and mild increase in plasma cells (8% aspirate; 10-15% CD 138 immunohistochemistry).  These findings are consistent with a plasma cell neoplasm.  Congo red stain for amyloid is pending.  Iron stores were present.  There were no ringed sideroblasts.  Conventional cytogenetics and fluorescence in situ hybridization was requested and is pending.   In the interim since his last visit, both his appetite and weight remain stable.  Since discontinuing prednisone, he feels better. He has no visual changes or hearing deficit. He reports no rash or itching.  He denies any headache, dizziness, lightheadedness, syncope, near syncopal episodes. There is no unusual cough, sore throat, or  orthopnea.  He has no dyspnea at rest.  He is chronic but stable exertional dyspnea unchanged over his baseline.  He continues to complain of significant slightly improved.  There is no pain or difficulty in swallowing.  No current fever, shaking chills, sweats, or flulike symptoms are evident.  The drenching night sweats have resolved completely. He has no heartburn or indigestion. There is no nausea, vomiting, diarrhea, or constipation.  He generally moves his bowels once daily or every other day.  He denies melena or bright red blood per rectum. There is no urinary frequency, urgency, hematuria, or dysuria.  There are no new focal areas of bone, joint, muscle pain.  He reports no bleeding tendency or easy bruisability.  He has no numbness or tingling in the fingers or toes.   His other comorbid problems include benign prostatic hypertrophy; diverticular disease; vitamin D deficiency on replacement; primary hypertension; positive double-stranded DNA (ANA titer elevated) on prednisone; a large right kidney cyst and smaller bilateral renal cysts; and chronic kidney disease.  He is a lifetime nondrinker and non-smoker.   Recommendation/Plan: We reviewed once again in detail the results of his kappa/light chain analysis and urine free kappa and lambda light chain analysis. Those results were suggestive of a underlying plasma cell dyscrasia.  They are outlined above.  The results of the bone marrow aspiration and biopsy morphologically and by immunophenotypic flow cytometry suggest a small increase in plasma cells. The bone marrow plasma cell percentage should be estimated from a core biopsy specimen, when possible. However, if the percent plasma cells in the aspirate and core biopsy differ, the higher value should be used. Flow cytometry and immunohistochemistry for CD138, kappa or lambda light chains should not be used to determine bone marrow plasma cell percentage as background staining and other technical  issues can overestimate the result. Clonality can be established by demonstrating kappa/lambda light chain restriction on flow cytometry.  The results of conventional cytogenetics and molecular profiling with fluorescent in situ hybridization are pending. The studies are very important and have both predictive and prognostic benefit.  We discussed the results of his metastatic skeletal survey.  There is no convincing evidence of osteolytic or osteoblastic skeletal lesions.  Although other modalities have proven to be more sensitive than plain radiographs for the detection of most skeletal lesions of myeloma, I would consider either whole body low-dose CT imaging without contrast or FDG PET/CT if his paraproteins increase or he develops new skeletal pain.    At the present time, it was recommended that we continue to follow his protein studies and renal function. For now, he does not meet the criteria for even "smoldering multiple myeloma."  This seems especially prudent given the fact that he is feeling better and no longer having night sweats. The corollary is that he be followed fairly closely with serial protein studies and if necessary imaging.  He is very comfortable with the plan of "active surveillance."  He is scheduled for repeat laboratory studies on December 2.  Barring any unforeseen complications, his next scheduled doctor visit to discuss those results is now scheduled on December 9.  He was advised to call us in the interim should any new or untoward problems arise.  The total time spent discussing the results of his bone marrow aspiration and biopsy, the implications, and plan for serial monitoring was 30 minutes. At least 50% of that time was spent in face-to-face discussion, counseling, and answering questions.  Ample time was allotted to answer all questions.  This note was dictated using voice activated technology/software.  Unfortunately, typographical errors are not uncommon, and  transcription is subject to mistakes and regrettably misinterpretation.  If necessary, clarification of the above information can be discussed with me at any time.  FOLLOW UP: AS DIRECTED  cc:   Riki Sheer, DO          Sarina Ill MD         Bo Merino, MD         Bluford Kaufmann, MD   Henreitta Leber, MD  Hematology/Oncology [facility] [address] [place], [place] [address] Office: [phone] [facility]: [phone]

## 2018-09-09 ENCOUNTER — Encounter (HOSPITAL_COMMUNITY): Payer: Self-pay | Admitting: Hematology and Oncology

## 2018-09-10 ENCOUNTER — Ambulatory Visit (INDEPENDENT_AMBULATORY_CARE_PROVIDER_SITE_OTHER): Payer: Medicare Other

## 2018-09-10 DIAGNOSIS — Z23 Encounter for immunization: Secondary | ICD-10-CM

## 2018-09-10 NOTE — Progress Notes (Signed)
Hi

## 2018-09-17 ENCOUNTER — Ambulatory Visit (INDEPENDENT_AMBULATORY_CARE_PROVIDER_SITE_OTHER): Payer: Medicare Other | Admitting: Neurology

## 2018-09-17 ENCOUNTER — Ambulatory Visit: Payer: Medicare Other | Admitting: Neurology

## 2018-09-17 DIAGNOSIS — G5601 Carpal tunnel syndrome, right upper limb: Secondary | ICD-10-CM

## 2018-09-17 DIAGNOSIS — G603 Idiopathic progressive neuropathy: Secondary | ICD-10-CM | POA: Diagnosis not present

## 2018-09-17 DIAGNOSIS — Z0289 Encounter for other administrative examinations: Secondary | ICD-10-CM

## 2018-09-17 DIAGNOSIS — R200 Anesthesia of skin: Secondary | ICD-10-CM | POA: Diagnosis not present

## 2018-09-17 DIAGNOSIS — M6281 Muscle weakness (generalized): Secondary | ICD-10-CM | POA: Diagnosis not present

## 2018-09-17 NOTE — Progress Notes (Signed)
Full Name: Alex Dunn Gender: Male MRN #: [id number] Date of Birth: 2036-10-31    Visit Date: 09/17/18 08:14 Age: 82 Years 3 Months Old Examining Physician: Sarina Ill, MD  Referring Physician: Jaynee Eagles, MD  History: Patient with hx of weakness  Summary: EMG/NCS performed on right arm and leg  The right median APB motor nerve showed prolonged distal onset latency (4.9 ms, N<4.4). The right Peroneal motor nerve showed reduced amplitude(0.69mV, N>2). The right Tibial motor nerve showed prolonged distal onset latency (6.50ms, N<5.8) and reduced amplitude(2.2, N>4).The right Radial sensory nerve showed reduced amplitude(10uV, N>15). The right Sural sensory nerve showed prolonged distal onset latency (4.5 ms, N<4.4) and reduced amplitude(2uV, N>6). The right superficial motor nerve showed NR. The right Median 2nd Digit orthodromic sensory nerve showed prolonged distal peak latency (4.9 ms, N<3.4) and reduced amplitude(3 uV, N>10).F Wave studies indicate that the right Tibial F wave has delayed latency(69.2, N<60ms). The right median/ulnar (palm) comparison nerve showed prolonged distal peak latency (Median Palm, 3.4 ms, N<2.2) and abnormal peak latency difference (Median Palm-Ulnar Palm, 1.6 ms, N<0.4) with a relative median delay. Repetitive nerve stimulation showed no significant decrement. All remaining nerves normal. The right opponens pollicis muscle showed increased amplitude and reduced recruitment. The right medial gastroc showed increased spontaneous activity (2+psw) and reduced recruitment. All remaining muscles within normal limits.     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 There is also concomitant right moderately-severe Carpal Tunnel Syndrome. No suggestion of myopathy/myositis, neuromuscular disease, radiculopathy.  Sarina Ill, MD  [facility] [place], [place] [address] Tel: [phone] Fax:  [phone]        [facility]    Nerve / Sites Muscle Latency Ref. Amplitude Ref. Rel Amp Segments Distance Velocity Ref. Area    ms ms mV mV %  cm m/s m/s mVms  R Median - APB     Wrist APB 4.9 ?4.4 8.4 ?4.0 100 Wrist - APB 7   30.5     Upper arm APB 9.8  7.2  85.9 Upper arm - Wrist 24 49 ?49 29.5  R Ulnar - ADM     Wrist ADM 3.2 ?3.3 6.3 ?6.0 100 Wrist - ADM 7   22.7     B.Elbow ADM 8.1  6.4  102 B.Elbow - Wrist 24 49 ?49 23.8     A.Elbow ADM 10.2  6.2  97.6 A.Elbow - B.Elbow 10 49 ?49 23.8         A.Elbow - Wrist      R Peroneal - EDB     Ankle EDB 5.2 ?6.5 0.9 ?2.0 100 Ankle - EDB 9   3.6     Fib head EDB 14.0  0.7  80.5 Fib head - Ankle 36 41 ?44 3.3     Pop fossa EDB 16.5  0.7  89.1 Pop fossa - Fib head 10 40 ?44 3.0         Pop fossa - Ankle      R Tibial - AH     Ankle AH 6.8 ?5.8 2.2 ?4.0 100 Ankle - AH 9   5.2     Pop fossa AH 20.0  1.2  53.8 Pop fossa - Ankle 45 34 ?41 5.7             SNC    Nerve / Sites Rec. Site Peak Lat Ref.  Amp Ref. Segments Distance Peak Diff Ref.    ms ms V V  cm ms ms  R Radial - Anatomical snuff box (Forearm)     Forearm Wrist 2.9 ?2.9 10 ?15 Forearm - Wrist 10    R Sural - Ankle (Calf)     Calf Ankle 4.5 ?4.4 2 ?6 Calf - Ankle 14    R Superficial peroneal - Ankle     Lat leg Ankle NR ?4.4 NR ?6 Lat leg - Ankle 14    R Median, Ulnar - Transcarpal comparison     Median Palm Wrist 3.4 ?2.2 7 ?35 Median Palm - Wrist 8       Ulnar Palm Wrist 1.8 ?2.2 3 ?12 Ulnar Palm - Wrist 8          Median Palm - Ulnar Palm  1.6 ?0.4  R Median - Orthodromic (Dig II, Mid palm)     Dig II Wrist 4.9 ?3.4 3 ?10 Dig II - Wrist 13    R Ulnar - Orthodromic, (Dig V, Mid palm)     Dig V Wrist 3.1 ?3.1 5 ?5 Dig V - Wrist 32                    F  Wave    Nerve F Lat Ref.   ms ms  R Tibial - AH 69.2 ?56.0  R Ulnar - ADM 31.7 ?32.0         Rep Stim           EMG full       EMG Summary Table    Spontaneous MUAP Recruitment  Muscle IA Fib PSW Fasc Other Amp  Dur. Poly Pattern  R. Deltoid Normal None None None _______ Normal Normal Normal Normal  R. Triceps brachii Normal None None None _______ Normal Normal Normal Normal  R. Pronator teres Normal None None None _______ Normal Normal Normal Normal  R. First dorsal interosseous Normal None None None _______ Normal Normal Normal Normal  R. Opponens pollicis Normal None None None _______ Increased Normal Normal Reduced  R. Iliopsoas Normal None None None _______ Normal Normal Normal Normal  R. Vastus medialis Normal None None None _______ Normal Normal Normal Normal  R. Tibialis anterior Normal None None None _______ Normal Normal Normal Normal  R. Biceps femoris (long head) Normal None None None _______ Normal Normal Normal Normal  R. Gluteus maximus Normal None None None _______ Normal Normal Normal Normal  R. Gluteus medius Normal None None None _______ Normal Normal Normal Normal  R. Lumbar paraspinals (low) Normal None None None _______ Normal Normal Normal Normal  R. Cervical paraspinals (low) Normal None None None _______ Normal Normal Normal Normal  R. Biceps brachii Normal None None None _______ Normal Normal Normal Normal  R. Gastrocnemius (Medial head) Normal None None None _______ Increased Normal 2+ Reduced

## 2018-09-17 NOTE — Progress Notes (Signed)
See procedure note.

## 2018-09-29 ENCOUNTER — Telehealth: Payer: Self-pay

## 2018-09-29 NOTE — Procedures (Signed)
Full Name: Alex Dunn Gender: Male MRN #: [id number] Date of Birth: 2036-08-07    Visit Date: 09/17/18 08:14 Age: 82 Years 3 Months Old Examining Physician: Sarina Ill, MD  Referring Physician: Jaynee Eagles, MD  History: Patient with hx of weakness  Summary: EMG/NCS performed on right arm and leg  The right median APB motor nerve showed prolonged distal onset latency (4.9 ms, N<4.4). The right Peroneal motor nerve showed reduced amplitude(0.64mV, N>2). The right Tibial motor nerve showed prolonged distal onset latency (6.69ms, N<5.8) and reduced amplitude(2.2, N>4).The right Radial sensory nerve showed reduced amplitude(10uV, N>15). The right Sural sensory nerve showed prolonged distal onset latency (4.5 ms, N<4.4) and reduced amplitude(2uV, N>6). The right superficial motor nerve showed NR. The right Median 2nd Digit orthodromic sensory nerve showed prolonged distal peak latency (4.9 ms, N<3.4) and reduced amplitude(3 uV, N>10).F Wave studies indicate that the right Tibial F wave has delayed latency(69.2, N<53ms). The right median/ulnar (palm) comparison nerve showed prolonged distal peak latency (Median Palm, 3.4 ms, N<2.2) and abnormal peak latency difference (Median Palm-Ulnar Palm, 1.6 ms, N<0.4) with a relative median delay. Repetitive nerve stimulation showed no significant decrement. All remaining nerves normal. The right opponens pollicis muscle showed increased amplitude and reduced recruitment. The right medial gastroc showed increased spontaneous activity (2+psw) and reduced recruitment. All remaining muscles within normal limits.     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 There is also concomitant right moderately-severe Carpal Tunnel Syndrome. No suggestion of myopathy/myositis, neuromuscular disease, radiculopathy.  Sarina Ill, MD  [facility] [place], [place] [address] Tel: [phone] Fax:  [phone]        [facility]    Nerve / Sites Muscle Latency Ref. Amplitude Ref. Rel Amp Segments Distance Velocity Ref. Area    ms ms mV mV %  cm m/s m/s mVms  R Median - APB     Wrist APB 4.9 ?4.4 8.4 ?4.0 100 Wrist - APB 7   30.5     Upper arm APB 9.8  7.2  85.9 Upper arm - Wrist 24 49 ?49 29.5  R Ulnar - ADM     Wrist ADM 3.2 ?3.3 6.3 ?6.0 100 Wrist - ADM 7   22.7     B.Elbow ADM 8.1  6.4  102 B.Elbow - Wrist 24 49 ?49 23.8     A.Elbow ADM 10.2  6.2  97.6 A.Elbow - B.Elbow 10 49 ?49 23.8         A.Elbow - Wrist      R Peroneal - EDB     Ankle EDB 5.2 ?6.5 0.9 ?2.0 100 Ankle - EDB 9   3.6     Fib head EDB 14.0  0.7  80.5 Fib head - Ankle 36 41 ?44 3.3     Pop fossa EDB 16.5  0.7  89.1 Pop fossa - Fib head 10 40 ?44 3.0         Pop fossa - Ankle      R Tibial - AH     Ankle AH 6.8 ?5.8 2.2 ?4.0 100 Ankle - AH 9   5.2     Pop fossa AH 20.0  1.2  53.8 Pop fossa - Ankle 45 34 ?41 5.7             SNC    Nerve / Sites Rec. Site Peak Lat Ref.  Amp Ref. Segments Distance Peak Diff Ref.    ms ms V V  cm ms ms  R Radial - Anatomical snuff box (Forearm)     Forearm Wrist 2.9 ?2.9 10 ?15 Forearm - Wrist 10    R Sural - Ankle (Calf)     Calf Ankle 4.5 ?4.4 2 ?6 Calf - Ankle 14    R Superficial peroneal - Ankle     Lat leg Ankle NR ?4.4 NR ?6 Lat leg - Ankle 14    R Median, Ulnar - Transcarpal comparison     Median Palm Wrist 3.4 ?2.2 7 ?35 Median Palm - Wrist 8       Ulnar Palm Wrist 1.8 ?2.2 3 ?12 Ulnar Palm - Wrist 8          Median Palm - Ulnar Palm  1.6 ?0.4  R Median - Orthodromic (Dig II, Mid palm)     Dig II Wrist 4.9 ?3.4 3 ?10 Dig II - Wrist 13    R Ulnar - Orthodromic, (Dig V, Mid palm)     Dig V Wrist 3.1 ?3.1 5 ?5 Dig V - Wrist 57                    F  Wave    Nerve F Lat Ref.   ms ms  R Tibial - AH 69.2 ?56.0  R Ulnar - ADM 31.7 ?32.0         Rep Stim           EMG full       EMG Summary Table    Spontaneous MUAP Recruitment  Muscle IA Fib PSW Fasc Other Amp  Dur. Poly Pattern  R. Deltoid Normal None None None _______ Normal Normal Normal Normal  R. Triceps brachii Normal None None None _______ Normal Normal Normal Normal  R. Pronator teres Normal None None None _______ Normal Normal Normal Normal  R. First dorsal interosseous Normal None None None _______ Normal Normal Normal Normal  R. Opponens pollicis Normal None None None _______ Increased Normal Normal Reduced  R. Iliopsoas Normal None None None _______ Normal Normal Normal Normal  R. Vastus medialis Normal None None None _______ Normal Normal Normal Normal  R. Tibialis anterior Normal None None None _______ Normal Normal Normal Normal  R. Biceps femoris (long head) Normal None None None _______ Normal Normal Normal Normal  R. Gluteus maximus Normal None None None _______ Normal Normal Normal Normal  R. Gluteus medius Normal None None None _______ Normal Normal Normal Normal  R. Lumbar paraspinals (low) Normal None None None _______ Normal Normal Normal Normal  R. Cervical paraspinals (low) Normal None None None _______ Normal Normal Normal Normal  R. Biceps brachii Normal None None None _______ Normal Normal Normal Normal  R. Gastrocnemius (Medial head) Normal None None None _______ Increased Normal 2+ Reduced

## 2018-09-29 NOTE — Progress Notes (Signed)
Reviewed extensive workup with patient inclusing emg/ncs, labwork and imaging. Labs included B12 (242), heavy metals, B6, B1, MMA, lyme, MMA showed a monoclonal IgA protein and he is following with hematology. Reviewed MRi of the brain images with patient and wife, MRi brain unremarkable, MRi cervical spine with multilevel degenerative changes but normal cervical cord which rules out etiology in the lower extremities where he reports weakness as well. EMG/NCS showed polyneuropathy but no myopathy/myositis or myasthenia gravis. MG antibodies also negative. He is feeling better, discussed no neurologic cause for his symptoms and to continue to follow with hematology and rheumatology.  This MRI of the brain with and without contrast shows the following: 1.     Mild generalized cortical atrophy. 2.     T2/FLAIR hyperintense foci of the hemispheres consistent with mild chronic microvascular ischemic changes 3.     Vertebrobasilar and internal carotid artery dolichoectasia. 4.     There are no acute findings and there is a normal enhancement pattern  IMPRESSION: This MRI of the cervical spine without contrast shows the following: 1.     The spinal cord appears normal. 2.     At C3-C4, there is borderline spinal stenosis due to degenerative changes also causing mild to moderate foraminal narrowing.  There is no nerve root compression. 3.     At C4-C5, there is mild spinal stenosis due to degenerative changes also causing mild to moderate foraminal narrowing.  There is no nerve root compression. 4.     At C7-T1, there is minimal anterolisthesis, facet hypertrophy and disc bulging.  There is no spinal stenosis or nerve root compression. 5.     There are milder degenerative changes at the other cervical levels as detailed above that do not lead to spinal stenosis or nerve root compression. 6.     There are endplate deformations involving the C3-C6 vertebral bodies most consistent with chronic osteoporotic  endplate fractures. 7.     There are no acute findings.  A total of 25 minutes was spent face-to-face with this patient. Over half this time was spent on counseling patient on the  1. Muscle weakness   2. Leg numbness   3. Idiopathic progressive neuropathy   4. Carpal tunnel syndrome of right wrist     diagnosis and different diagnostic and therapeutic options, counseling and coordination of care, risks ans benefits of management, compliance, or risk factor reduction and education.  This does not include time spent on emg/ncs.

## 2018-09-29 NOTE — Telephone Encounter (Signed)
Per patient request to change lab appointment date to Tuesday 3rd of December due to a prescheduled appointment. Per 11/26 walk in

## 2018-10-05 ENCOUNTER — Other Ambulatory Visit: Payer: Self-pay | Admitting: Hematology and Oncology

## 2018-10-05 ENCOUNTER — Other Ambulatory Visit: Payer: Medicare Other

## 2018-10-05 DIAGNOSIS — N183 Chronic kidney disease, stage 3 unspecified: Secondary | ICD-10-CM

## 2018-10-05 DIAGNOSIS — D472 Monoclonal gammopathy: Secondary | ICD-10-CM

## 2018-10-06 ENCOUNTER — Inpatient Hospital Stay: Payer: Medicare Other | Attending: Hematology and Oncology

## 2018-10-06 DIAGNOSIS — D472 Monoclonal gammopathy: Secondary | ICD-10-CM | POA: Insufficient documentation

## 2018-10-06 DIAGNOSIS — N183 Chronic kidney disease, stage 3 unspecified: Secondary | ICD-10-CM

## 2018-10-06 DIAGNOSIS — R5382 Chronic fatigue, unspecified: Secondary | ICD-10-CM | POA: Insufficient documentation

## 2018-10-06 LAB — BASIC METABOLIC PANEL - CANCER CENTER ONLY
Anion gap: 8 (ref 5–15)
BUN: 20 mg/dL (ref 8–23)
CHLORIDE: 107 mmol/L (ref 98–111)
CO2: 25 mmol/L (ref 22–32)
CREATININE: 1.53 mg/dL — AB (ref 0.61–1.24)
Calcium: 9 mg/dL (ref 8.9–10.3)
GFR, Est AFR Am: 49 mL/min — ABNORMAL LOW (ref >60)
GFR, Estimated: 42 mL/min — ABNORMAL LOW (ref >60)
Glucose, Bld: 95 mg/dL (ref 70–99)
Potassium: 4.2 mmol/L (ref 3.5–5.1)
Sodium: 140 mmol/L (ref 135–145)

## 2018-10-06 LAB — CBC WITH DIFFERENTIAL (CANCER CENTER ONLY)
Abs Immature Granulocytes: 0.02 10*3/uL (ref 0.00–0.07)
BASOS ABS: 0 10*3/uL (ref 0.0–0.1)
Basophils Relative: 1 %
EOS PCT: 1 %
Eosinophils Absolute: 0.1 10*3/uL (ref 0.0–0.5)
HEMATOCRIT: 42.8 % (ref 39.0–52.0)
HEMOGLOBIN: 13.9 g/dL (ref 13.0–17.0)
Immature Granulocytes: 0 %
LYMPHS PCT: 27 %
Lymphs Abs: 1.7 10*3/uL (ref 0.7–4.0)
MCH: 31.2 pg (ref 26.0–34.0)
MCHC: 32.5 g/dL (ref 30.0–36.0)
MCV: 96.2 fL (ref 80.0–100.0)
MONO ABS: 0.6 10*3/uL (ref 0.1–1.0)
Monocytes Relative: 10 %
Neutro Abs: 3.9 10*3/uL (ref 1.7–7.7)
Neutrophils Relative %: 61 %
Platelet Count: 176 10*3/uL (ref 150–400)
RBC: 4.45 MIL/uL (ref 4.22–5.81)
RDW: 12.7 % (ref 11.5–15.5)
WBC Count: 6.2 10*3/uL (ref 4.0–10.5)
nRBC: 0 % (ref 0.0–0.2)

## 2018-10-06 LAB — LACTATE DEHYDROGENASE: LDH: 150 U/L (ref 98–192)

## 2018-10-07 LAB — PROTEIN ELECTROPHORESIS, SERUM
A/G RATIO SPE: 1.2 (ref 0.7–1.7)
ALPHA-2-GLOBULIN: 0.7 g/dL (ref 0.4–1.0)
Albumin ELP: 3.5 g/dL (ref 2.9–4.4)
Alpha-1-Globulin: 0.2 g/dL (ref 0.0–0.4)
Beta Globulin: 1.3 g/dL (ref 0.7–1.3)
GLOBULIN, TOTAL: 3 g/dL (ref 2.2–3.9)
Gamma Globulin: 0.8 g/dL (ref 0.4–1.8)
Total Protein ELP: 6.5 g/dL (ref 6.0–8.5)

## 2018-10-07 LAB — KAPPA/LAMBDA LIGHT CHAINS
KAPPA, LAMDA LIGHT CHAIN RATIO: 2.41 — AB (ref 0.26–1.65)
Kappa free light chain: 45.7 mg/L — ABNORMAL HIGH (ref 3.3–19.4)
LAMDA FREE LIGHT CHAINS: 19 mg/L (ref 5.7–26.3)

## 2018-10-07 LAB — IGG, IGA, IGM
IGA: 583 mg/dL — AB (ref 61–437)
IgG (Immunoglobin G), Serum: 1021 mg/dL (ref 700–1600)
IgM (Immunoglobulin M), Srm: 48 mg/dL (ref 15–143)

## 2018-10-09 LAB — IMMUNOFIXATION ELECTROPHORESIS
IgA: 562 mg/dL — ABNORMAL HIGH (ref 61–437)
IgG (Immunoglobin G), Serum: 970 mg/dL (ref 700–1600)
IgM (Immunoglobulin M), Srm: 51 mg/dL (ref 15–143)
TOTAL PROTEIN ELP: 6.5 g/dL (ref 6.0–8.5)

## 2018-10-12 ENCOUNTER — Inpatient Hospital Stay: Payer: Medicare Other | Admitting: Hematology and Oncology

## 2018-10-13 NOTE — Progress Notes (Signed)
Alex Dunn OFFICE PROGRESS NOTE  Patient Care Team: Shelda Pal, DO as PCP - General (Family Medicine)  HEME/ONC OVERVIEW: 1. IgA kappa MGUS -Baseline labs (08/2018): M-spike 0.3g/dL, free kappa 49, nl Hgb, CKD (Cr 1.4-1.5); 8% plasma cells on bone marrow biopsy, karyotype 46,XY, no cytogenetic abnormalities; negative skeletal survey   PERTINENT NON-HEM/ONC PROBLEMS: 1. Stage III CKD (Cr 1.4-1.5), due to polycystic kidney disease   ASSESSMENT & PLAN:   IgA kappa MGUS -I reviewed the recent labs in detail with the patient and his spouse -I also discussed at length with the patient and his spouse regarding the pathophysiology, diagnostic criteria, and natural history of plasma cell dyscrasia, including MGUS and multiple myeloma -While SPEP did not identify a clear M-protein, IFE showed IgA kappa monoclonal protein; serum free kappa light chain very mildly elevated; bone marrow biopsy showed 8% plasma cells -In light of the extensive work-up as documented above, patient most likely has IgA kappa MGUS; he has longstanding CKD, which is unlikely to be related to MGUS -At this time, there is no indication for further work-up or treatment -We will monitor the patient's labs every 6 months for any interval changes  Stage III CKD -Cr stable between 1.4 and 1.5 -Most likely secondary to polycystic kidney disease -I will defer the management to PCP  Chronic fatigue -Etiology unclear -Patient has been evaluated by multiple subspecialties, but unfortunately the work-up has been unremarkable -I explained to the patient that I did not think MGUS was playing a role in his chronic fatigue -I will defer further work-up and management to PCP  Orders Placed This Encounter  Procedures  . CBC with Differential (Cancer Center Only)    Standing Status:   Future    Standing Expiration Date:   11/24/2019  . CMP ([place] only)    Standing Status:   Future    Standing  Expiration Date:   11/24/2019  . Kappa/lambda light chains    Standing Status:   Future    Standing Expiration Date:   04/20/2020  . Multiple Myeloma Panel (SPEP&IFE w/QIG)    Standing Status:   Future    Standing Expiration Date:   11/24/2019   All questions were answered. The patient knows to call the clinic with any problems, questions or concerns. No barriers to learning was detected.  A total of more than 25 minutes were spent face-to-face with the patient during this encounter and over half of that time was spent on counseling and coordination of care as outlined above.   Return in 6 months for labs and clinic follow-up.  Tish Men, MD 10/20/2018 11:49 AM  CHIEF COMPLAINT: "I am here for my labs"  INTERVAL HISTORY: Alex Dunn returns to clinic for follow-up of recent lab work-up.  He reports that he still has a chronic fatigue, which is unchanged.  He had a cold last week, but it has mostly resolved except mild rhinorrhea.  He denies any fever, chill, weight change, night sweats, chest pain, dyspnea, or cough.  REVIEW OF SYSTEMS:   Constitutional: ( - ) fevers, ( - )  chills , ( - ) night sweats Eyes: ( - ) blurriness of vision, ( - ) double vision, ( - ) watery eyes Ears, nose, mouth, throat, and face: ( - ) mucositis, ( - ) sore throat Respiratory: ( - ) cough, ( - ) dyspnea, ( - ) wheezes Cardiovascular: ( - ) palpitation, ( - ) chest discomfort, ( - )  lower extremity swelling Gastrointestinal:  ( - ) nausea, ( - ) heartburn, ( - ) change in bowel habits Skin: ( - ) abnormal skin rashes Lymphatics: ( - ) new lymphadenopathy, ( - ) easy bruising Neurological: ( - ) numbness, ( - ) tingling, ( - ) new weaknesses Behavioral/Psych: ( - ) mood change, ( - ) new changes  All other systems were reviewed with the patient and are negative.  I have reviewed the past medical history, past surgical history, social history and family history with the patient and they are unchanged from  previous note.  ALLERGIES:  is allergic to codeine phosphate and lexapro [escitalopram oxalate].  MEDICATIONS:  Current Outpatient Medications  Medication Sig Dispense Refill  . pantoprazole (PROTONIX) 40 MG tablet Take 1 tablet (40 mg total) by mouth daily as needed. (Patient taking differently: Take 40 mg by mouth daily as needed (heartburn). ) 90 tablet 3   No current facility-administered medications for this visit.     PHYSICAL EXAMINATION: ECOG PERFORMANCE STATUS: 1 - Symptomatic but completely ambulatory  Today's Vitals   10/20/18 1030 10/20/18 1033  BP: (!) 150/86   Pulse: 73   Resp: 20   Temp: 98.2 F (36.8 C)   TempSrc: Oral   SpO2: 99%   Weight: 190 lb 11.2 oz (86.5 kg)   Height: '6\' 1"'$  (1.854 m)   PainSc:  0-No pain   Body mass index is 25.16 kg/m.  Filed Weights   10/20/18 1030  Weight: 190 lb 11.2 oz (86.5 kg)    GENERAL: alert, no distress and comfortable SKIN: skin color, texture, turgor are normal, no rashes or significant lesions EYES: conjunctiva are pink and non-injected, sclera clear OROPHARYNX: no exudate, no erythema; lips, buccal mucosa, and tongue normal  NECK: supple, non-tender LYMPH:  no palpable lymphadenopathy in the cervical or axillary  LUNGS: clear to auscultation and percussion with normal breathing effort HEART: regular rate & rhythm and no murmurs and no lower extremity edema ABDOMEN: soft, non-tender, non-distended, normal bowel sounds Musculoskeletal: no cyanosis of digits and no clubbing  PSYCH: alert & oriented x 3, fluent speech NEURO: no focal motor/sensory deficits  LABORATORY DATA:  I have reviewed the data as listed    Component Value Date/Time   NA 140 10/06/2018 1145   NA 142 07/20/2018 0944   K 4.2 10/06/2018 1145   CL 107 10/06/2018 1145   CO2 25 10/06/2018 1145   GLUCOSE 95 10/06/2018 1145   BUN 20 10/06/2018 1145   BUN 22 07/20/2018 0944   CREATININE 1.53 (H) 10/06/2018 1145   CALCIUM 9.0 10/06/2018 1145    PROT 6.9 08/12/2018 1511   PROT 6.6 07/20/2018 0944   ALBUMIN 3.6 08/12/2018 1511   AST 15 08/12/2018 1511   ALT 13 08/12/2018 1511   ALKPHOS 75 08/12/2018 1511   BILITOT 0.4 08/12/2018 1511   GFRNONAA 42 (L) 10/06/2018 1145   GFRAA 49 (L) 10/06/2018 1145    No results found for: SPEP, UPEP  Lab Results  Component Value Date   WBC 6.2 10/06/2018   NEUTROABS 3.9 10/06/2018   HGB 13.9 10/06/2018   HCT 42.8 10/06/2018   MCV 96.2 10/06/2018   PLT 176 10/06/2018      Chemistry      Component Value Date/Time   NA 140 10/06/2018 1145   NA 142 07/20/2018 0944   K 4.2 10/06/2018 1145   CL 107 10/06/2018 1145   CO2 25 10/06/2018 1145   BUN 20  10/06/2018 1145   BUN 22 07/20/2018 0944   CREATININE 1.53 (H) 10/06/2018 1145      Component Value Date/Time   CALCIUM 9.0 10/06/2018 1145   ALKPHOS 75 08/12/2018 1511   AST 15 08/12/2018 1511   ALT 13 08/12/2018 1511   BILITOT 0.4 08/12/2018 1511

## 2018-10-20 ENCOUNTER — Inpatient Hospital Stay: Payer: Medicare Other | Admitting: Hematology

## 2018-10-20 ENCOUNTER — Telehealth: Payer: Self-pay | Admitting: Hematology

## 2018-10-20 ENCOUNTER — Encounter: Payer: Self-pay | Admitting: Hematology

## 2018-10-20 VITALS — BP 150/86 | HR 73 | Temp 98.2°F | Resp 20 | Ht 73.0 in | Wt 190.7 lb

## 2018-10-20 DIAGNOSIS — N183 Chronic kidney disease, stage 3 unspecified: Secondary | ICD-10-CM

## 2018-10-20 DIAGNOSIS — R5382 Chronic fatigue, unspecified: Secondary | ICD-10-CM

## 2018-10-20 DIAGNOSIS — J3489 Other specified disorders of nose and nasal sinuses: Secondary | ICD-10-CM

## 2018-10-20 DIAGNOSIS — D472 Monoclonal gammopathy: Secondary | ICD-10-CM

## 2018-10-20 DIAGNOSIS — Q613 Polycystic kidney, unspecified: Secondary | ICD-10-CM | POA: Diagnosis not present

## 2018-10-20 NOTE — Telephone Encounter (Signed)
Return in 6 months for labs and clinic follow-up at [facility] . Patient will contact [place] location to make appointment.

## 2018-11-19 ENCOUNTER — Ambulatory Visit: Payer: Medicare Other | Admitting: Family Medicine

## 2018-11-19 ENCOUNTER — Encounter: Payer: Self-pay | Admitting: Family Medicine

## 2018-11-19 VITALS — BP 152/80 | HR 71 | Temp 97.6°F | Ht 73.0 in | Wt 191.2 lb

## 2018-11-19 DIAGNOSIS — I10 Essential (primary) hypertension: Secondary | ICD-10-CM | POA: Diagnosis not present

## 2018-11-19 MED ORDER — LOSARTAN POTASSIUM 100 MG PO TABS: 100.0000 mg | ORAL_TABLET | Freq: Every day | ORAL | 3 refills | Status: DC

## 2018-11-19 NOTE — Patient Instructions (Addendum)
Keep checking your blood pressures at home.  Keep the diet clean and stay active.  Let me know if medicine is too expensive, it should be $4 if not run through insurance.   Get me the results of your labs from your kidney team in 2 weeks.   Let us know if you need anything.

## 2018-11-19 NOTE — Progress Notes (Signed)
Pre visit review using our clinic review tool, if applicable. No additional management support is needed unless otherwise documented below in the visit note. 

## 2018-11-19 NOTE — Progress Notes (Signed)
Chief Complaint  Patient presents with  . Hypertension    Subjective Alex Dunn is a 83 y.o. male who presents for hypertension follow up. He does monitor home blood pressures. Blood pressures ranging from 150-170's/90's on average. He is compliant with medications. Patient has these side effects of medication: none He is adhering to a healthy diet overall. Current exercise: active at work. Has been happening for the past week.    Past Medical History:  Diagnosis Date  . ANEMIA-IRON DEFICIENCY 06/23/2007  . BENIGN PROSTATIC HYPERTROPHY 06/23/2007  . Diverticulosis   . GERD 06/23/2007  . Hypertension   . Polycystic kidney disease    stage 3 per wife    Review of Systems Cardiovascular: no chest pain Respiratory:  no shortness of breath  Exam BP (!) 152/80 (BP Location: Left Arm, Patient Position: Sitting, Cuff Size: Normal)   Pulse 71   Temp 97.6 F (36.4 C) (Oral)   Ht 6\' 1"  (1.854 m)   Wt 191 lb 4 oz (86.8 kg)   SpO2 97%   BMI 25.23 kg/m  General:  well developed, well nourished, in no apparent distress Heart: RRR, no bruits, no LE edema Lungs: clear to auscultation, no accessory muscle use Psych: well oriented with normal range of affect and appropriate judgment/insight  Essential hypertension - Plan: losartan (COZAAR) 100 MG tablet, Basic metabolic panel  Orders as above. OK to have nephro draw their labs as originally planned in two weeks but we need to see results. Take BP at home. Counseled on diet and exercise. F/u in 1 mo. The patient voiced understanding and agreement to the plan.  [place], DO 11/19/18  9:30 AM

## 2018-12-07 DIAGNOSIS — N183 Chronic kidney disease, stage 3 (moderate): Secondary | ICD-10-CM | POA: Diagnosis not present

## 2018-12-16 DIAGNOSIS — N183 Chronic kidney disease, stage 3 (moderate): Secondary | ICD-10-CM | POA: Diagnosis not present

## 2018-12-16 DIAGNOSIS — I129 Hypertensive chronic kidney disease with stage 1 through stage 4 chronic kidney disease, or unspecified chronic kidney disease: Secondary | ICD-10-CM | POA: Diagnosis not present

## 2018-12-16 DIAGNOSIS — D631 Anemia in chronic kidney disease: Secondary | ICD-10-CM | POA: Diagnosis not present

## 2018-12-16 DIAGNOSIS — Q614 Renal dysplasia: Secondary | ICD-10-CM | POA: Diagnosis not present

## 2018-12-21 ENCOUNTER — Ambulatory Visit (INDEPENDENT_AMBULATORY_CARE_PROVIDER_SITE_OTHER): Payer: Medicare Other | Admitting: Family Medicine

## 2018-12-21 ENCOUNTER — Encounter: Payer: Self-pay | Admitting: Family Medicine

## 2018-12-21 VITALS — BP 134/68 | HR 66 | Temp 97.9°F | Ht 73.0 in | Wt 196.1 lb

## 2018-12-21 DIAGNOSIS — I10 Essential (primary) hypertension: Secondary | ICD-10-CM | POA: Diagnosis not present

## 2018-12-21 DIAGNOSIS — R5383 Other fatigue: Secondary | ICD-10-CM

## 2018-12-21 NOTE — Progress Notes (Signed)
Chief Complaint  Patient presents with  . Follow-up    Subjective Alex Dunn is a 83 y.o. male who presents for hypertension follow up. He does monitor home blood pressures. Blood pressures ranging from 120-130's/60-70's on average. He is compliant with medication- recently started on Losartan 100 mg/d. Patient has these side effects of medication: none He is adhering to a healthy diet overall. Current exercise: active at home, no scheduled exercise  He continues to have excessive fatigue.  He is only able to work for short bursts of time in his yard.  It is improving overall.  He does have history of vitamin D deficiency.  He has a relatively recent diagnosis of mgus.  He is starting to sleep better since his blood pressure is controlled.  Diet is fair.  CBC and TSH normal in 2019.   Past Medical History:  Diagnosis Date  . ANEMIA-IRON DEFICIENCY 06/23/2007  . BENIGN PROSTATIC HYPERTROPHY 06/23/2007  . Diverticulosis   . GERD 06/23/2007  . Hypertension   . Polycystic kidney disease    stage 3 per wife    Review of Systems Cardiovascular: no chest pain Respiratory:  no shortness of breath  Exam BP 134/68 (BP Location: Left Arm, Patient Position: Sitting, Cuff Size: Normal)   Pulse 66   Temp 97.9 F (36.6 C) (Oral)   Ht 6\' 1"  (1.854 m)   Wt 196 lb 2 oz (89 kg)   SpO2 97%   BMI 25.88 kg/m  General:  well developed, well nourished, in no apparent distress Heart: RRR, no bruits, no LE edema Lungs: clear to auscultation, no accessory muscle use Psych: well oriented with normal range of affect and appropriate judgment/insight  Essential hypertension  Other fatigue  Continue losartan. Counseled on diet and exercise. Take supplemental vitamin D.  I will see him in April for his physical and we will recheck that level then.  Try to stay as active as possible. F/u as originally scheduled. The patient voiced understanding and agreement to the plan.  [place], DO 12/21/18  10:03 AM

## 2018-12-21 NOTE — Patient Instructions (Signed)
Try taking 5000 units of Vit D daily. Let's recheck in 3 months.   OK to take Tylenol 1000 mg (2 extra strength tabs) or 975 mg (3 regular strength tabs) every 6 hours as needed. Let me know if this does not help your hand pain.  Your blood pressure looks good today.  Let us know if you need anything.

## 2019-02-26 ENCOUNTER — Encounter: Payer: Self-pay | Admitting: Family Medicine

## 2019-02-26 ENCOUNTER — Ambulatory Visit (INDEPENDENT_AMBULATORY_CARE_PROVIDER_SITE_OTHER): Payer: Medicare Other | Admitting: Family Medicine

## 2019-02-26 ENCOUNTER — Other Ambulatory Visit: Payer: Self-pay | Admitting: Family Medicine

## 2019-02-26 ENCOUNTER — Ambulatory Visit: Payer: Self-pay

## 2019-02-26 ENCOUNTER — Other Ambulatory Visit: Payer: Self-pay

## 2019-02-26 DIAGNOSIS — D472 Monoclonal gammopathy: Secondary | ICD-10-CM

## 2019-02-26 DIAGNOSIS — R5383 Other fatigue: Secondary | ICD-10-CM

## 2019-02-26 MED ORDER — PREDNISONE 20 MG PO TABS: 40.0000 mg | ORAL_TABLET | Freq: Every day | ORAL | 0 refills | Status: AC

## 2019-02-26 NOTE — Progress Notes (Addendum)
CC: Fatigue  Alex Dunn here for fatigue. Due to outbreak, we are interacting via telephone as he does not have the tech requirements for a video visit. I verified patient's ID using 2 identifiers.    Duration: 2 days  Associated symptoms: myalgia and fatigue  +hx of MGUS dx'd last year. States it started similar to this episode. Was on pred, does not remember if it helped. Currently follows with Dr. Maylon Peppers of oncology team.  Denies: fevers, unintentional weight loss Treatment to date: none  ROS:  Const: Denies fevers HEENT: As noted in HPI Lungs: No SOB  Past Medical History:  Diagnosis Date  . ANEMIA-IRON DEFICIENCY 06/23/2007  . BENIGN PROSTATIC HYPERTROPHY 06/23/2007  . Diverticulosis   . GERD 06/23/2007  . Hypertension   . Polycystic kidney disease    stage 3 per wife   Exam No conversational dyspnea Age appropriate judgment and insight Nml affect and mood  MGUS (monoclonal gammopathy of unknown significance) - Plan: CBC w/Diff, Comprehensive metabolic panel, TSH, predniSONE (DELTASONE) 20 MG tablet  Fatigue, unspecified type - Plan: CBC w/Diff, Comprehensive metabolic panel, TSH  Contact oncology.  Trial pred burst. Admitted this may not be helpful. Ck labs while he gets in contact with oncology. Continue to push fluids, practice good hand hygiene. Total time spent: 7 minutes F/u prn. If starting to experience fevers, shaking, or shortness of breath, seek immediate care. Pt voiced understanding and agreement to the plan.  [place], DO 02/26/19 2:42 PM

## 2019-02-26 NOTE — Telephone Encounter (Signed)
Virtual visit scheduled.  

## 2019-02-26 NOTE — Telephone Encounter (Signed)
Patient called and says that he woke up Thursday morning around 0300 with body aches, chills, feeling weak. He says around 0900 yesterday after taking 1 Tylenol, the symptoms went away, except for the weakness. He says the body aches and chills came back this morning, but not as bad as it was, but now they are gone. He says he's just weak right now, not having the energy to get up to do anything. He denies SOB and any other symptoms. I asked is he able to do a web visit, he says no. I called the office and spoke to [place], [facility] who asks to speak to the patient, the call was connected successfully.   Answer Assessment - Initial Assessment Questions 1. DESCRIPTION: "Describe how you are feeling."     Feels weak 2. SEVERITY: "How bad is it?"  "Can you stand and walk?"   - MILD - Feels weak or tired, but does not interfere with work, school or normal activities   - [place] to stand and walk; weakness interferes with work, school, or normal activities   - SEVERE - Unable to stand or walk     Moderate 3. ONSET:  "When did the weakness begin?"     Yesterday at 0300 when woke up 4. CAUSE: "What do you think is causing the weakness?"     I don't know 5. MEDICINES: "Have you recently started a new medicine or had a change in the amount of a medicine?"     No 6. OTHER SYMPTOMS: "Do you have any other symptoms?" (e.g., chest pain, fever, cough, SOB, vomiting, diarrhea, bleeding, other areas of pain)     Not now, but had body aches and chills this morning 7. PREGNANCY: "Is there any chance you are pregnant?" "When was your last menstrual period?"     N/A  Protocols used: WEAKNESS (GENERALIZED) AND FATIGUE-A-AH

## 2019-02-26 NOTE — Addendum Note (Signed)
Addended by: Kelle Darting A on: 02/26/2019 03:11 PM   Modules accepted: Orders

## 2019-02-27 LAB — COMPREHENSIVE METABOLIC PANEL
AG Ratio: 1.2 (calc) (ref 1.0–2.5)
ALT: 10 U/L (ref 9–46)
AST: 13 U/L (ref 10–35)
Albumin: 3.7 g/dL (ref 3.6–5.1)
Alkaline phosphatase (APISO): 85 U/L (ref 35–144)
BUN/Creatinine Ratio: 16 (calc) (ref 6–22)
BUN: 25 mg/dL (ref 7–25)
CO2: 25 mmol/L (ref 20–32)
Calcium: 9.1 mg/dL (ref 8.6–10.3)
Chloride: 107 mmol/L (ref 98–110)
Creat: 1.61 mg/dL — ABNORMAL HIGH (ref 0.70–1.11)
Globulin: 3.1 g/dL (calc) (ref 1.9–3.7)
Glucose, Bld: 103 mg/dL — ABNORMAL HIGH (ref 65–99)
Potassium: 4.4 mmol/L (ref 3.5–5.3)
Sodium: 140 mmol/L (ref 135–146)
Total Bilirubin: 0.4 mg/dL (ref 0.2–1.2)
Total Protein: 6.8 g/dL (ref 6.1–8.1)

## 2019-02-27 LAB — CBC WITH DIFFERENTIAL/PLATELET
Absolute Monocytes: 646 cells/uL (ref 200–950)
Basophils Absolute: 48 cells/uL (ref 0–200)
Basophils Relative: 0.7 %
Eosinophils Absolute: 122 cells/uL (ref 15–500)
Eosinophils Relative: 1.8 %
HCT: 42.6 % (ref 38.5–50.0)
Hemoglobin: 14.5 g/dL (ref 13.2–17.1)
Lymphs Abs: 1952 cells/uL (ref 850–3900)
MCH: 31.8 pg (ref 27.0–33.0)
MCHC: 34 g/dL (ref 32.0–36.0)
MCV: 93.4 fL (ref 80.0–100.0)
MPV: 11 fL (ref 7.5–12.5)
Monocytes Relative: 9.5 %
Neutro Abs: 4032 cells/uL (ref 1500–7800)
Neutrophils Relative %: 59.3 %
Platelets: 215 10*3/uL (ref 140–400)
RBC: 4.56 10*6/uL (ref 4.20–5.80)
RDW: 12.6 % (ref 11.0–15.0)
Total Lymphocyte: 28.7 %
WBC: 6.8 10*3/uL (ref 3.8–10.8)

## 2019-02-27 LAB — TSH: TSH: 3.49 mIU/L (ref 0.40–4.50)

## 2019-03-01 ENCOUNTER — Telehealth: Payer: Self-pay | Admitting: Family Medicine

## 2019-03-01 ENCOUNTER — Telehealth: Payer: Self-pay | Admitting: *Deleted

## 2019-03-01 ENCOUNTER — Ambulatory Visit: Payer: Medicare Other | Admitting: *Deleted

## 2019-03-01 ENCOUNTER — Encounter: Payer: Medicare Other | Admitting: Family Medicine

## 2019-03-01 DIAGNOSIS — I10 Essential (primary) hypertension: Secondary | ICD-10-CM

## 2019-03-01 MED ORDER — LOSARTAN POTASSIUM 100 MG PO TABS: 100.0000 mg | ORAL_TABLET | Freq: Every day | ORAL | 3 refills | Status: DC

## 2019-03-01 NOTE — Telephone Encounter (Signed)
Call received from patient's wife stating that pt saw Dr. Nani Ravens on Friday, 02/26/19 and that pt was told to contact Dr. Maylon Peppers regarding fatigue and chills.  Dr. Maylon Peppers notified and order received for pt to come in for labs and visit with Dr. Maylon Peppers this Friday, 03/05/19. Call placed back to patient's wife and notified her of MD orders.   Message sent to scheduling.

## 2019-03-01 NOTE — Telephone Encounter (Signed)
If prednisone is helping, finish. If it is not helping, stop it. This is probably the cause. Ty.

## 2019-03-01 NOTE — Telephone Encounter (Signed)
Copied from [place] [id number]. Topic: Quick Communication - See Telephone Encounter >> Mar 01, 2019 11:13 AM Robina Ade, Helene Kelp D wrote: CRM for notification. See Telephone encounter for: 03/01/19. Patient would like to talk to Dr. Nani Ravens or his CMA about his BP. He said he forgot to talk to provider about this. Please call patient back, thanks.

## 2019-03-01 NOTE — Telephone Encounter (Signed)
BP at nights has been going up--last night was 150/80 and 160/80.  Patient was put on prednisone 4 to 5 days ago and not sure if that is causing it.  He is taking his BP meds. BP checked around noon 145/70. Has about 3 to 4 pills left. Call back on either home number [phone] or wife's cell number [phone]

## 2019-03-01 NOTE — Telephone Encounter (Signed)
Patient informed of PCP instructions Patient verbalized understanding.

## 2019-03-05 ENCOUNTER — Inpatient Hospital Stay: Payer: Medicare Other

## 2019-03-05 ENCOUNTER — Inpatient Hospital Stay: Payer: Medicare Other | Attending: Hematology | Admitting: Hematology

## 2019-03-05 ENCOUNTER — Other Ambulatory Visit: Payer: Self-pay

## 2019-03-05 ENCOUNTER — Telehealth: Payer: Self-pay | Admitting: Hematology & Oncology

## 2019-03-05 ENCOUNTER — Encounter: Payer: Self-pay | Admitting: Hematology

## 2019-03-05 VITALS — BP 159/74 | HR 79 | Temp 98.4°F | Resp 20

## 2019-03-05 DIAGNOSIS — N183 Chronic kidney disease, stage 3 unspecified: Secondary | ICD-10-CM

## 2019-03-05 DIAGNOSIS — D472 Monoclonal gammopathy: Secondary | ICD-10-CM

## 2019-03-05 DIAGNOSIS — R5382 Chronic fatigue, unspecified: Secondary | ICD-10-CM | POA: Diagnosis not present

## 2019-03-05 DIAGNOSIS — M25552 Pain in left hip: Secondary | ICD-10-CM | POA: Insufficient documentation

## 2019-03-05 DIAGNOSIS — M25551 Pain in right hip: Secondary | ICD-10-CM | POA: Diagnosis not present

## 2019-03-05 DIAGNOSIS — M25559 Pain in unspecified hip: Secondary | ICD-10-CM

## 2019-03-05 DIAGNOSIS — Q613 Polycystic kidney, unspecified: Secondary | ICD-10-CM | POA: Diagnosis not present

## 2019-03-05 LAB — CMP (CANCER CENTER ONLY)
ALT: 19 U/L (ref 0–44)
AST: 13 U/L — ABNORMAL LOW (ref 15–41)
Albumin: 3.9 g/dL (ref 3.5–5.0)
Alkaline Phosphatase: 82 U/L (ref 38–126)
Anion gap: 7 (ref 5–15)
BUN: 26 mg/dL — ABNORMAL HIGH (ref 8–23)
CO2: 29 mmol/L (ref 22–32)
Calcium: 9.4 mg/dL (ref 8.9–10.3)
Chloride: 104 mmol/L (ref 98–111)
Creatinine: 1.55 mg/dL — ABNORMAL HIGH (ref 0.61–1.24)
GFR, Est AFR Am: 48 mL/min — ABNORMAL LOW (ref >60)
GFR, Estimated: 42 mL/min — ABNORMAL LOW (ref >60)
Glucose, Bld: 97 mg/dL (ref 70–99)
Potassium: 4.2 mmol/L (ref 3.5–5.1)
Sodium: 140 mmol/L (ref 135–145)
Total Bilirubin: 0.6 mg/dL (ref 0.3–1.2)
Total Protein: 7 g/dL (ref 6.5–8.1)

## 2019-03-05 LAB — CBC WITH DIFFERENTIAL (CANCER CENTER ONLY)
Abs Immature Granulocytes: 0.06 10*3/uL (ref 0.00–0.07)
Basophils Absolute: 0 10*3/uL (ref 0.0–0.1)
Basophils Relative: 1 %
Eosinophils Absolute: 0.1 10*3/uL (ref 0.0–0.5)
Eosinophils Relative: 1 %
HCT: 45 % (ref 39.0–52.0)
Hemoglobin: 15 g/dL (ref 13.0–17.0)
Immature Granulocytes: 1 %
Lymphocytes Relative: 27 %
Lymphs Abs: 2.3 10*3/uL (ref 0.7–4.0)
MCH: 31.8 pg (ref 26.0–34.0)
MCHC: 33.3 g/dL (ref 30.0–36.0)
MCV: 95.5 fL (ref 80.0–100.0)
Monocytes Absolute: 0.7 10*3/uL (ref 0.1–1.0)
Monocytes Relative: 9 %
Neutro Abs: 5.3 10*3/uL (ref 1.7–7.7)
Neutrophils Relative %: 61 %
Platelet Count: 205 10*3/uL (ref 150–400)
RBC: 4.71 MIL/uL (ref 4.22–5.81)
RDW: 13.1 % (ref 11.5–15.5)
WBC Count: 8.6 10*3/uL (ref 4.0–10.5)
nRBC: 0 % (ref 0.0–0.2)

## 2019-03-05 NOTE — Telephone Encounter (Signed)
Appointments scheduled letter/calendar mailed per 5/1 los °

## 2019-03-05 NOTE — Progress Notes (Signed)
[facility] OFFICE PROGRESS NOTE  Patient Care Team: Wendling, Nicholas Paul, DO as PCP - General (Family Medicine)  HEME/ONC OVERVIEW: 1. IgA kappa MGUS -Previous patient of Dr. Ruben  -08/2018: baseline parameters  Normal Hgb, CKD (Cr 1.4-1.5)  M-spike 0.3 g/dL, free kappa 49, quant IgA ~560  Bone marrow bx: 8% plasma cells on bone marrow biopsy, karyotype 46,XY, no cytogenetic abnormalities  Negative skeletal survey   TREATMENT REGIMEN:  Surveillance   PERTINENT NON-HEM/ONC PROBLEMS: 1. Stage III CKD (Cr 1.4-1.5), due to polycystic kidney disease   ASSESSMENT & PLAN:   IgA kappa MGUS -MM labs pending today -However, given the normal CBC and stable Cr, there is very low suspicion that his MGUS has progressed -Furthermore, I explained to the patient that his recent MM panel in 10/2018 did not show any M-spike, making it highly unlikely that his MGUS has somehow progressed to MM -No indication for further work-up at this time -We will continue to monitor his MM labs q6months  Generalized arthralgia -Etiology unclear; overall, the arthralgia (and myalgia) has improved significantly over the past week -No other associated symptoms, such as rhinorrhea, cough, sputum production, or GI symptoms, to suggest COVID -Patient has history of osteoarthritis, and given the persistent arthralgia primarily involving the hips, this may be due to osteoarthritis -He has an established orthopedist Dr. Adam Kendall, and I encouraged the patient to follow up with him for further evaluation  Stage III CKD -Secondary to polycystic kidney disease  -Baseline Cr 1.4-1.6 -Cr 1.5 today, stable; electrolytes normal -I encouraged patient to follow up with PCP for further management  Chronic fatigue -Etiology unclear; patient has previously been evaluated by multiple subspecialties, but the work-up has been unremarkable -I re-emphasized with the patient again that I did think MGUS was  playing a role in his chronic fatigue -Review of his nutritional studies showed borderline low B12, and I recommended patient to try a trial of OTC B12 supplement -We also discussed physical conditioning exercises that may improve his energy level, including walking and low weightbearing exercises -I encouraged patient to continue follow up with his PCP for further management of fatigue  Orders Placed This Encounter  Procedures  . CBC with Differential (Cancer Center Only)    Standing Status:   Future    Standing Expiration Date:   04/08/2020  . CMP (Cancer Center only)    Standing Status:   Future    Standing Expiration Date:   04/08/2020  . Multiple Myeloma Panel (SPEP&IFE w/QIG)    Standing Status:   Future    Standing Expiration Date:   04/08/2020  . Kappa/lambda light chains    Standing Status:   Future    Standing Expiration Date:   09/04/2020  . Lactate dehydrogenase    Standing Status:   Future    Standing Expiration Date:   04/08/2020  . AMB referral to orthopedics    Referral Priority:   Routine    Referral Type:   Consultation    Referred to Provider:   Kendall, Adam S, MD    Number of Visits Requested:   1   All questions were answered. The patient knows to call the clinic with any problems, questions or concerns. No barriers to learning was detected.  A total of more than 25 minutes were spent face-to-face with the patient during this encounter and over half of that time was spent on counseling and coordination of care as outlined above.   Return in   6 months for labs and clinic follow-up.   Tish Men, MD 03/05/2019 10:38 AM  CHIEF COMPLAINT: "I am just still tired "  INTERVAL HISTORY: [name] returns to clinic for follow-up of MGUS.  Patient reports that starting a week ago, he woke up with some generalized myalgia and arthralgia.  He denied any associated fever, rhinorrhea, cough, dyspnea, nausea, vomiting, or diarrhea.  Since these symptoms first began, they have  improved steadily without any intervention.  The myalgia has nearly completely resolved, and patient is mostly having some arthralgia to his bilateral hips.  He has history of osteoarthritis, and is followed by Dr. Delilah Shan at [facility].  He still has chronic fatigue, which has been overall stable.  He denies any other complaint today.  SUMMARY OF ONCOLOGIC HISTORY:  No history exists.    REVIEW OF SYSTEMS:   Constitutional: ( - ) fevers, ( - )  chills , ( - ) night sweats, ( + ) fatigue Eyes: ( - ) blurriness of vision, ( - ) double vision, ( - ) watery eyes Ears, nose, mouth, throat, and face: ( - ) mucositis, ( - ) sore throat Respiratory: ( - ) cough, ( - ) dyspnea, ( - ) wheezes Cardiovascular: ( - ) palpitation, ( - ) chest discomfort, ( - ) lower extremity swelling Gastrointestinal:  ( - ) nausea, ( - ) heartburn, ( - ) change in bowel habits Skin: ( - ) abnormal skin rashes Lymphatics: ( - ) new lymphadenopathy, ( - ) easy bruising Neurological: ( - ) numbness, ( - ) tingling, ( - ) new weaknesses Behavioral/Psych: ( - ) mood change, ( - ) new changes  All other systems were reviewed with the patient and are negative.  I have reviewed the past medical history, past surgical history, social history and family history with the patient and they are unchanged from previous note.  ALLERGIES:  is allergic to codeine phosphate and lexapro [escitalopram oxalate].  MEDICATIONS:  Current Outpatient Medications  Medication Sig Dispense Refill  . losartan (COZAAR) 100 MG tablet Take 1 tablet (100 mg total) by mouth daily. 30 tablet 3  . pantoprazole (PROTONIX) 40 MG tablet Take 1 tablet (40 mg total) by mouth daily as needed. (Patient taking differently: Take 40 mg by mouth daily as needed (heartburn). ) 90 tablet 3   No current facility-administered medications for this visit.     PHYSICAL EXAMINATION: ECOG PERFORMANCE STATUS: 2 - Symptomatic, <50% confined to bed  Today's Vitals    03/05/19 1018  BP: (!) 159/74  Pulse: 79  Resp: 20  Temp: 98.4 F (36.9 C)  TempSrc: Oral  SpO2: 99%  PainSc: 0-No pain   There is no height or weight on file to calculate BMI.  There were no vitals filed for this visit.  GENERAL: alert, no distress and comfortable SKIN: skin color, texture, turgor are normal, no rashes or significant lesions EYES: conjunctiva are pink and non-injected, sclera clear OROPHARYNX: no exudate, no erythema; lips, buccal mucosa, and tongue normal  NECK: supple, non-tendercervical LUNGS: clear to auscultation with normal breathing effort HEART: regular rate & rhythm and no murmurs and no lower extremity edema ABDOMEN: soft, non-tender, non-distended, normal bowel sounds Musculoskeletal: no cyanosis of digits and no clubbing  PSYCH: alert & oriented x 3, fluent speech  LABORATORY DATA:  I have reviewed the data as listed    Component Value Date/Time   NA 140 03/05/2019 0915   NA 142 07/20/2018 0944  K 4.2 03/05/2019 0915   CL 104 03/05/2019 0915   CO2 29 03/05/2019 0915   GLUCOSE 97 03/05/2019 0915   BUN 26 (H) 03/05/2019 0915   BUN 22 07/20/2018 0944   CREATININE 1.55 (H) 03/05/2019 0915   CREATININE 1.61 (H) 02/26/2019 1511   CALCIUM 9.4 03/05/2019 0915   PROT 7.0 03/05/2019 0915   PROT 6.6 07/20/2018 0944   ALBUMIN 3.9 03/05/2019 0915   AST 13 (L) 03/05/2019 0915   ALT 19 03/05/2019 0915   ALKPHOS 82 03/05/2019 0915   BILITOT 0.6 03/05/2019 0915   GFRNONAA 42 (L) 03/05/2019 0915   GFRAA 48 (L) 03/05/2019 0915    No results found for: SPEP, UPEP  Lab Results  Component Value Date   WBC 8.6 03/05/2019   NEUTROABS 5.3 03/05/2019   HGB 15.0 03/05/2019   HCT 45.0 03/05/2019   MCV 95.5 03/05/2019   PLT 205 03/05/2019      Chemistry      Component Value Date/Time   NA 140 03/05/2019 0915   NA 142 07/20/2018 0944   K 4.2 03/05/2019 0915   CL 104 03/05/2019 0915   CO2 29 03/05/2019 0915   BUN 26 (H) 03/05/2019 0915   BUN 22  07/20/2018 0944   CREATININE 1.55 (H) 03/05/2019 0915   CREATININE 1.61 (H) 02/26/2019 1511      Component Value Date/Time   CALCIUM 9.4 03/05/2019 0915   ALKPHOS 82 03/05/2019 0915   AST 13 (L) 03/05/2019 0915   ALT 19 03/05/2019 0915   BILITOT 0.6 03/05/2019 0915      

## 2019-03-08 LAB — KAPPA/LAMBDA LIGHT CHAINS
Kappa free light chain: 40.9 mg/L — ABNORMAL HIGH (ref 3.3–19.4)
Kappa, lambda light chain ratio: 2.45 — ABNORMAL HIGH (ref 0.26–1.65)
Lambda free light chains: 16.7 mg/L (ref 5.7–26.3)

## 2019-03-08 LAB — MULTIPLE MYELOMA PANEL, SERUM
Albumin SerPl Elph-Mcnc: 3.7 g/dL (ref 2.9–4.4)
Albumin/Glob SerPl: 1.2 (ref 0.7–1.7)
Alpha 1: 0.2 g/dL (ref 0.0–0.4)
Alpha2 Glob SerPl Elph-Mcnc: 1 g/dL (ref 0.4–1.0)
B-Globulin SerPl Elph-Mcnc: 1.3 g/dL (ref 0.7–1.3)
Gamma Glob SerPl Elph-Mcnc: 0.8 g/dL (ref 0.4–1.8)
Globulin, Total: 3.3 g/dL (ref 2.2–3.9)
IgA: 601 mg/dL — ABNORMAL HIGH (ref 61–437)
IgG (Immunoglobin G), Serum: 1056 mg/dL (ref 603–1613)
IgM (Immunoglobulin M), Srm: 57 mg/dL (ref 15–143)
M Protein SerPl Elph-Mcnc: 0.3 g/dL — ABNORMAL HIGH
Total Protein ELP: 7 g/dL (ref 6.0–8.5)

## 2019-03-09 DIAGNOSIS — M545 Low back pain, unspecified: Secondary | ICD-10-CM | POA: Insufficient documentation

## 2019-03-09 HISTORY — DX: Low back pain, unspecified: M54.50

## 2019-03-10 DIAGNOSIS — M47817 Spondylosis without myelopathy or radiculopathy, lumbosacral region: Secondary | ICD-10-CM

## 2019-03-10 DIAGNOSIS — M545 Low back pain: Secondary | ICD-10-CM | POA: Diagnosis not present

## 2019-03-10 HISTORY — DX: Spondylosis without myelopathy or radiculopathy, lumbosacral region: M47.817

## 2019-03-11 ENCOUNTER — Telehealth: Payer: Self-pay | Admitting: *Deleted

## 2019-03-11 NOTE — Telephone Encounter (Addendum)
Patient aware of results.   ----- Message from Tish Men, MD sent at 03/09/2019  9:05 AM EDT ----- Can we let Mr. Norberto know that his M-protein for MGUS is unchanged? There is no change in the plan for his MGUS.  Thanks.  [place]  ----- Message ----- From: Buel Ream, [facility] [place] Sent: 03/05/2019   9:25 AM EDT To: Tish Men, MD

## 2019-03-22 DIAGNOSIS — M545 Low back pain: Secondary | ICD-10-CM | POA: Diagnosis not present

## 2019-03-30 DIAGNOSIS — M545 Low back pain: Secondary | ICD-10-CM | POA: Diagnosis not present

## 2019-03-30 DIAGNOSIS — M47817 Spondylosis without myelopathy or radiculopathy, lumbosacral region: Secondary | ICD-10-CM | POA: Diagnosis not present

## 2019-04-02 ENCOUNTER — Other Ambulatory Visit: Payer: Self-pay

## 2019-04-02 ENCOUNTER — Ambulatory Visit (INDEPENDENT_AMBULATORY_CARE_PROVIDER_SITE_OTHER): Payer: Medicare Other | Admitting: Family Medicine

## 2019-04-02 ENCOUNTER — Encounter: Payer: Self-pay | Admitting: Family Medicine

## 2019-04-02 DIAGNOSIS — M79604 Pain in right leg: Secondary | ICD-10-CM | POA: Diagnosis not present

## 2019-04-02 DIAGNOSIS — M79605 Pain in left leg: Secondary | ICD-10-CM | POA: Diagnosis not present

## 2019-04-02 DIAGNOSIS — R29898 Other symptoms and signs involving the musculoskeletal system: Secondary | ICD-10-CM | POA: Diagnosis not present

## 2019-04-02 MED ORDER — GABAPENTIN 100 MG PO CAPS: ORAL_CAPSULE | ORAL | 1 refills | Status: DC

## 2019-04-02 NOTE — Progress Notes (Signed)
Chief Complaint  Patient presents with  . Referral    Subjective: Patient is a 83 y.o. male here for f/u back pain, leg pain and weakness. Due to COVID-19 pandemic, we are interacting via phone. I verified patient's ID using 2 identifiers. Patient agreed to proceed with visit via this method. Patient is at home, I am at office. Patient and I are present for visit.   Pt reports he saw sports med for LBP. MRI neg, ordered for suspected spinal stenosis. He has an appt next week for back injections. Hx of weakness in RLE, EMG/NCT largely neg with Dr. Jaynee Eagles of [place]. Things have worsened. No inj or change in activity. Hx of MGUS, saw oncologist recently and unlikely related to that. Has used Tylenol at home that does help somewhat. LLE is starting to be affected. Worse in the middle of the night. Sometimes gets it when physically active, but the longer he walks, the better it gets.   ROS: MSK: +back pain Neuro: + weakness  Past Medical History:  Diagnosis Date  . ANEMIA-IRON DEFICIENCY 06/23/2007  . BENIGN PROSTATIC HYPERTROPHY 06/23/2007  . Diverticulosis   . GERD 06/23/2007  . Hypertension   . Polycystic kidney disease    stage 3 per wife    Objective: No conversational dyspnea Age appropriate judgment and insight Nml affect and mood  Assessment and Plan: Pain in both lower extremities - Plan: gabapentin (NEURONTIN) 100 MG capsule, CBC, Comprehensive metabolic panel, IBC + Ferritin, Magnesium  Weakness of both lower extremities - Plan: CBC, Comprehensive metabolic panel, IBC + Ferritin, Magnesium  Ck labs. See what Dr. Nelva Bush has to say and see how he does. Low dose of gabapentin. He is scheduled with me in a little over 2 weeks and we will reconvene there.  Total time spent: 16 minutes. The patient voiced understanding and agreement to the plan.  [place], DO 04/02/19  2:29 PM

## 2019-04-05 DIAGNOSIS — N183 Chronic kidney disease, stage 3 (moderate): Secondary | ICD-10-CM | POA: Diagnosis not present

## 2019-04-08 DIAGNOSIS — M5136 Other intervertebral disc degeneration, lumbar region: Secondary | ICD-10-CM | POA: Diagnosis not present

## 2019-04-15 DIAGNOSIS — D631 Anemia in chronic kidney disease: Secondary | ICD-10-CM | POA: Diagnosis not present

## 2019-04-15 DIAGNOSIS — E559 Vitamin D deficiency, unspecified: Secondary | ICD-10-CM | POA: Diagnosis not present

## 2019-04-15 DIAGNOSIS — N183 Chronic kidney disease, stage 3 (moderate): Secondary | ICD-10-CM | POA: Diagnosis not present

## 2019-04-15 DIAGNOSIS — I129 Hypertensive chronic kidney disease with stage 1 through stage 4 chronic kidney disease, or unspecified chronic kidney disease: Secondary | ICD-10-CM | POA: Diagnosis not present

## 2019-04-16 NOTE — Progress Notes (Signed)
Virtual Visit via Video Note  I connected with patient on 04/19/19 at  4:00 PM EDT by audio enabled telemedicine application and verified that I am speaking with the correct person using two identifiers.   THIS ENCOUNTER IS A VIRTUAL VISIT DUE TO COVID-19 - PATIENT WAS NOT SEEN IN THE OFFICE. PATIENT HAS CONSENTED TO VIRTUAL VISIT / TELEMEDICINE VISIT   Location of patient: home  Location of provider: office  I discussed the limitations of evaluation and management by telemedicine and the availability of in person appointments. The patient expressed understanding and agreed to proceed.   Subjective:   Alex Dunn is a 83 y.o. male who presents for Medicare Annual/Subsequent preventive examination.  Review of Systems: No ROS.  Medicare Wellness Virtual Visit.  Visual/audio telehealth visit, UTA vital signs.   See social history for additional risk factors. Cardiac Risk Factors include: advanced age (>23men, >62 women);hypertension;male gender Sleep patterns: Wakes a few times to urinate. Sleeps well.  Home Safety/Smoke Alarms: Feels safe in home. Smoke alarms in place.  Lives with wife in 1 story home. Walk in shower with bench.  Male:   CCS-  No longer doing routine screening due to age.    PSA-  Lab Results  Component Value Date   PSA 2.37 03/09/2018   PSA 2.09 10/12/2015   PSA 2.37 08/29/2014       Objective:     Advanced Directives 04/19/2019 10/20/2018 05/03/2018 04/16/2018 03/06/2018 05/01/2017 12/04/2014  Does Patient Have a Medical Advance Directive? No Yes Yes No;Yes No Yes No  Type of Advance Directive - - Living will [place];Living will - [place];Living will -  Does patient want to make changes to medical advance directive? - - No - Patient declined - - - -  Copy of [place] in Chart? - - - - - No - copy requested -  Would patient like information on creating a medical advance directive? No - Patient  declined - - - No - Patient declined - -    Tobacco Social History   Tobacco Use  Smoking Status Never Smoker  Smokeless Tobacco Never Used     Counseling given: Not Answered   Clinical Intake:  Pain : No/denies pain     Past Medical History:  Diagnosis Date  . ANEMIA-IRON DEFICIENCY 06/23/2007  . BENIGN PROSTATIC HYPERTROPHY 06/23/2007  . Diverticulosis   . GERD 06/23/2007  . Hypertension   . Polycystic kidney disease    stage 3 per wife   Past Surgical History:  Procedure Laterality Date  . HAND SURGERY    . LUNG SURGERY     for collapsed lung   Family History  Problem Relation Age of Onset  . Heart attack Mother   . Heart attack Father   . Heart attack Brother        "leaky valve"  . Neuromuscular disorder Neg Hx    Social History   Socioeconomic History  . Marital status: Married    Spouse name: Not on file  . Number of children: 1  . Years of education: Not on file  . Highest education level: High school graduate  Occupational History  . Not on file  Social Needs  . Financial resource strain: Not on file  . Food insecurity    Worry: Not on file    Inability: Not on file  . Transportation needs    Medical: Not on file    Non-medical: Not  on file  Tobacco Use  . Smoking status: Never Smoker  . Smokeless tobacco: Never Used  Substance and Sexual Activity  . Alcohol use: Never    Frequency: Never  . Drug use: Never  . Sexual activity: Not on file  Lifestyle  . Physical activity    Days per week: Not on file    Minutes per session: Not on file  . Stress: Not on file  Relationships  . Social Herbalist on phone: Not on file    Gets together: Not on file    Attends religious service: Not on file    Active member of club or organization: Not on file    Attends meetings of clubs or organizations: Not on file    Relationship status: Not on file  Other Topics Concern  . Not on file  Social History Narrative   Lives at home with his  wife   Retired   Right handed   Caffeine: about 2 cups daily    Outpatient Encounter Medications as of 04/19/2019  Medication Sig  . losartan (COZAAR) 100 MG tablet Take 1 tablet (100 mg total) by mouth daily.  . pantoprazole (PROTONIX) 40 MG tablet Take 1 tablet (40 mg total) by mouth daily as needed. (Patient taking differently: Take 40 mg by mouth daily as needed (heartburn). )  . gabapentin (NEURONTIN) 100 MG capsule Take 1 tab daily for 3 days, then take 2 tabs in evening for 3 days. (Patient not taking: Reported on 04/19/2019)   No facility-administered encounter medications on file as of 04/19/2019.     Activities of Daily Living In your present state of health, do you have any difficulty performing the following activities: 04/19/2019  Hearing? N  Vision? N  Difficulty concentrating or making decisions? N  Walking or climbing stairs? N  Dressing or bathing? N  Doing errands, shopping? N  Preparing Food and eating ? N  Using the Toilet? N  In the past six months, have you accidently leaked urine? N  Do you have problems with loss of bowel control? N  Managing your Medications? N  Managing your Finances? N  Housekeeping or managing your Housekeeping? N  Some recent data might be hidden    Patient Care Team: Shelda Pal, DO as PCP - General (Family Medicine)   Assessment:   This is a routine wellness examination for Alex Dunn. Physical assessment deferred to PCP.  Exercise Activities and Dietary recommendations Current Exercise Habits: The patient does not participate in regular exercise at present, Exercise limited by: orthopedic condition(s)(bilateral pain.) Diet (meal preparation, eat out, water intake, caffeinated beverages, dairy products, fruits and vegetables): well balanced, on average, 3 meals per day   Goals    . Maintain health and independence       Fall Risk Fall Risk  04/19/2019 04/30/2018 10/18/2016 10/16/2015 09/05/2014  Falls in the past year?  0 No No No No    Depression Screen PHQ 2/9 Scores 04/19/2019 04/30/2018 10/18/2016 10/16/2015  PHQ - 2 Score 0 0 0 0    Cognitive Function Ad8 score reviewed for issues:  Issues making decisions:no  Less interest in hobbies / activities:no  Repeats questions, stories (family complaining):no  Trouble using ordinary gadgets (microwave, computer, phone):no  Forgets the month or year: no  Mismanaging finances: no  Remembering appts:no  Daily problems with thinking and/or memory:no Ad8 score is=0         Immunization History  Administered Date(s) Administered  .  Influenza Split 08/30/2011, 09/01/2012  . Influenza Whole 09/03/2007, 08/11/2008, 09/13/2009, 08/24/2010  . Influenza, High Dose Seasonal PF 09/02/2013, 08/21/2016, 08/27/2017, 09/10/2018  . Influenza,inj,Quad PF,6+ Mos 09/05/2014, 08/01/2015  . PPD Test 03/09/2018  . Pneumococcal Conjugate-13 09/05/2014  . Pneumococcal Polysaccharide-23 11/09/2009  . Tetanus 09/02/2013  . Zoster 07/31/2014   Screening Tests Health Maintenance  Topic Date Due  . INFLUENZA VACCINE  06/05/2019  . TETANUS/TDAP  09/03/2023  . PNA vac Low Risk Adult  Completed     Plan:   See you next year!  Continue to eat heart healthy diet (full of fruits, vegetables, whole grains, lean protein, water--limit salt, fat, and sugar intake) and increase physical activity as tolerated.  Continue doing brain stimulating activities (puzzles, reading, adult coloring books, staying active) to keep memory sharp.     I have personally reviewed and noted the following in the patient's chart:   . Medical and social history . Use of alcohol, tobacco or illicit drugs  . Current medications and supplements . Functional ability and status . Nutritional status . Physical activity . Advanced directives . List of other physicians . Hospitalizations, surgeries, and ER visits in previous 12 months . Vitals . Screenings to include cognitive,  depression, and falls . Referrals and appointments  In addition, I have reviewed and discussed with patient certain preventive protocols, quality metrics, and best practice recommendations. A written personalized care plan for preventive services as well as general preventive health recommendations were provided to patient.     Shela Nevin, [place]  04/19/2019

## 2019-04-19 ENCOUNTER — Ambulatory Visit (INDEPENDENT_AMBULATORY_CARE_PROVIDER_SITE_OTHER): Payer: Medicare Other | Admitting: *Deleted

## 2019-04-19 ENCOUNTER — Encounter: Payer: Self-pay | Admitting: *Deleted

## 2019-04-19 ENCOUNTER — Other Ambulatory Visit: Payer: Self-pay

## 2019-04-19 DIAGNOSIS — Z0000 Encounter for general adult medical examination without abnormal findings: Secondary | ICD-10-CM

## 2019-04-19 NOTE — Patient Instructions (Signed)
See you next year!  Continue to eat heart healthy diet (full of fruits, vegetables, whole grains, lean protein, water--limit salt, fat, and sugar intake) and increase physical activity as tolerated.  Continue doing brain stimulating activities (puzzles, reading, adult coloring books, staying active) to keep memory sharp.    Alex Dunn , Thank you for taking time to come for your Medicare Wellness Visit. I appreciate your ongoing commitment to your health goals. Please review the following plan we discussed and let me know if I can assist you in the future.   These are the goals we discussed: Goals    . Maintain health and independence       This is a list of the screening recommended for you and due dates:  Health Maintenance  Topic Date Due  . Flu Shot  06/05/2019  . Tetanus Vaccine  09/03/2023  . Pneumonia vaccines  Completed    Health Maintenance After Age 28 After age 47, you are at a higher risk for certain long-term diseases and infections as well as injuries from falls. Falls are a major cause of broken bones and head injuries in people who are older than age 18. Getting regular preventive care can help to keep you healthy and well. Preventive care includes getting regular testing and making lifestyle changes as recommended by your health care provider. Talk with your health care provider about:  Which screenings and tests you should have. A screening is a test that checks for a disease when you have no symptoms.  A diet and exercise plan that is right for you. What should I know about screenings and tests to prevent falls? Screening and testing are the best ways to find a health problem early. Early diagnosis and treatment give you the best chance of managing medical conditions that are common after age 7. Certain conditions and lifestyle choices may make you more likely to have a fall. Your health care provider may recommend:  Regular vision checks. Poor vision and  conditions such as cataracts can make you more likely to have a fall. If you wear glasses, make sure to get your prescription updated if your vision changes.  Medicine review. Work with your health care provider to regularly review all of the medicines you are taking, including over-the-counter medicines. Ask your health care provider about any side effects that may make you more likely to have a fall. Tell your health care provider if any medicines that you take make you feel dizzy or sleepy.  Osteoporosis screening. Osteoporosis is a condition that causes the bones to get weaker. This can make the bones weak and cause them to break more easily.  Blood pressure screening. Blood pressure changes and medicines to control blood pressure can make you feel dizzy.  Strength and balance checks. Your health care provider may recommend certain tests to check your strength and balance while standing, walking, or changing positions.  Foot health exam. Foot pain and numbness, as well as not wearing proper footwear, can make you more likely to have a fall.  Depression screening. You may be more likely to have a fall if you have a fear of falling, feel emotionally low, or feel unable to do activities that you used to do.  Alcohol use screening. Using too much alcohol can affect your balance and may make you more likely to have a fall. What actions can I take to lower my risk of falls? General instructions  Talk with your health care provider about  your risks for falling. Tell your health care provider if: ? You fall. Be sure to tell your health care provider about all falls, even ones that seem minor. ? You feel dizzy, sleepy, or off-balance.  Take over-the-counter and prescription medicines only as told by your health care provider. These include any supplements.  Eat a healthy diet and maintain a healthy weight. A healthy diet includes low-fat dairy products, low-fat (lean) meats, and fiber from whole  grains, beans, and lots of fruits and vegetables. Home safety  Remove any tripping hazards, such as rugs, cords, and clutter.  Install safety equipment such as grab bars in bathrooms and safety rails on stairs.  Keep rooms and walkways well-lit. Activity   Follow a regular exercise program to stay fit. This will help you maintain your balance. Ask your health care provider what types of exercise are appropriate for you.  If you need a cane or walker, use it as recommended by your health care provider.  Wear supportive shoes that have nonskid soles. Lifestyle  Do not drink alcohol if your health care provider tells you not to drink.  If you drink alcohol, limit how much you have: ? 0-1 drink a day for women. ? 0-2 drinks a day for men.  Be aware of how much alcohol is in your drink. In the [place] one drink equals one typical bottle of beer (12 oz), one-half glass of wine (5 oz), or one shot of hard liquor (1 oz).  Do not use any products that contain nicotine or tobacco, such as cigarettes and e-cigarettes. If you need help quitting, ask your health care provider. Summary  Having a healthy lifestyle and getting preventive care can help to protect your health and wellness after age 81.  Screening and testing are the best way to find a health problem early and help you avoid having a fall. Early diagnosis and treatment give you the best chance for managing medical conditions that are more common for people who are older than age 57.  Falls are a major cause of broken bones and head injuries in people who are older than age 37. Take precautions to prevent a fall at home.  Work with your health care provider to learn what changes you can make to improve your health and wellness and to prevent falls. This information is not intended to replace advice given to you by your health care provider. Make sure you discuss any questions you have with your health care provider. Document  Released: 09/03/2017 Document Revised: 09/03/2017 Document Reviewed: 09/03/2017 Elsevier Interactive Patient Education  2019 [employer].

## 2019-04-20 ENCOUNTER — Ambulatory Visit: Payer: Medicare Other | Admitting: Hematology

## 2019-04-20 ENCOUNTER — Other Ambulatory Visit: Payer: Medicare Other

## 2019-04-20 NOTE — Progress Notes (Signed)
Noted. Agree with above.  [place], DO 04/20/19 8:14 AM

## 2019-04-21 ENCOUNTER — Ambulatory Visit (INDEPENDENT_AMBULATORY_CARE_PROVIDER_SITE_OTHER): Payer: Medicare Other | Admitting: Family Medicine

## 2019-04-21 ENCOUNTER — Encounter: Payer: Self-pay | Admitting: Family Medicine

## 2019-04-21 ENCOUNTER — Other Ambulatory Visit: Payer: Self-pay

## 2019-04-21 VITALS — BP 130/78 | HR 88 | Temp 98.8°F | Ht 72.0 in | Wt 200.0 lb

## 2019-04-21 DIAGNOSIS — R29898 Other symptoms and signs involving the musculoskeletal system: Secondary | ICD-10-CM | POA: Diagnosis not present

## 2019-04-21 DIAGNOSIS — Z0001 Encounter for general adult medical examination with abnormal findings: Secondary | ICD-10-CM | POA: Diagnosis not present

## 2019-04-21 DIAGNOSIS — Z0000 Encounter for general adult medical examination without abnormal findings: Secondary | ICD-10-CM

## 2019-04-21 LAB — LIPID PANEL
Cholesterol: 196 mg/dL (ref 0–200)
HDL: 38 mg/dL — ABNORMAL LOW (ref >39.00)
LDL Cholesterol: 127 mg/dL — ABNORMAL HIGH (ref 0–99)
NonHDL: 157.94
Total CHOL/HDL Ratio: 5
Triglycerides: 155 mg/dL — ABNORMAL HIGH (ref 0.0–149.0)
VLDL: 31 mg/dL (ref 0.0–40.0)

## 2019-04-21 LAB — CBC
HCT: 43 % (ref 39.0–52.0)
Hemoglobin: 14.3 g/dL (ref 13.0–17.0)
MCHC: 33.3 g/dL (ref 30.0–36.0)
MCV: 96.7 fl (ref 78.0–100.0)
Platelets: 202 10*3/uL (ref 150.0–400.0)
RBC: 4.45 Mil/uL (ref 4.22–5.81)
RDW: 13.6 % (ref 11.5–15.5)
WBC: 7.5 10*3/uL (ref 4.0–10.5)

## 2019-04-21 LAB — COMPREHENSIVE METABOLIC PANEL
ALT: 11 U/L (ref 0–53)
AST: 12 U/L (ref 0–37)
Albumin: 3.9 g/dL (ref 3.5–5.2)
Alkaline Phosphatase: 89 U/L (ref 39–117)
BUN: 24 mg/dL — ABNORMAL HIGH (ref 6–23)
CO2: 27 mEq/L (ref 19–32)
Calcium: 9.1 mg/dL (ref 8.4–10.5)
Chloride: 103 mEq/L (ref 96–112)
Creatinine, Ser: 1.56 mg/dL — ABNORMAL HIGH (ref 0.40–1.50)
GFR: 42.94 mL/min — ABNORMAL LOW (ref >60.00)
Glucose, Bld: 81 mg/dL (ref 70–99)
Potassium: 4.8 mEq/L (ref 3.5–5.1)
Sodium: 139 mEq/L (ref 135–145)
Total Bilirubin: 0.5 mg/dL (ref 0.2–1.2)
Total Protein: 6.6 g/dL (ref 6.0–8.3)

## 2019-04-21 LAB — T4, FREE: Free T4: 0.85 ng/dL (ref 0.60–1.60)

## 2019-04-21 LAB — VITAMIN B12: Vitamin B-12: 235 pg/mL (ref 211–911)

## 2019-04-21 MED ORDER — TRAMADOL HCL 50 MG PO TABS: 50.0000 mg | ORAL_TABLET | Freq: Three times a day (TID) | ORAL | 0 refills | Status: AC | PRN

## 2019-04-21 NOTE — Progress Notes (Signed)
Chief Complaint  Patient presents with  . Annual Exam  . Leg Pain  . Fatigue    Well Male Alex Dunn is here for a complete physical.   His last physical was >1 year ago.  Current diet: in general, a "healthy" diet.   Current exercise: none Weight trend: stable Daytime fatigue? No. Seat belt? Yes.    Health maintenance Tetanus- Yes Pneumonia vaccine- Yes   Continues to have back pain, thigh pain and weakness in the lower extremities. He had been seing ortho spine who got MRI that was neg, had an injection with Dr. Nelva Bush that did not help. Around 1 mo ago, was in shower and bent over to wash feet.  He got up and had resolved symptoms in his lower extremities.  He does feel this is getting worse. Saw Neuro in fall of 2019, but was getting better at time. Dr. Maylon Peppers does not think this is related to MGUS. Has been taking Tylenol, tramadol for breakthrough pain. Gabapentin did not help.   Past Medical History:  Diagnosis Date  . ANEMIA-IRON DEFICIENCY 06/23/2007  . BENIGN PROSTATIC HYPERTROPHY 06/23/2007  . Diverticulosis   . GERD 06/23/2007  . Hypertension   . Polycystic kidney disease    stage 3 per wife     Past Surgical History:  Procedure Laterality Date  . HAND SURGERY    . LUNG SURGERY     for collapsed lung    Medications  Current Outpatient Medications on File Prior to Visit  Medication Sig Dispense Refill  . losartan (COZAAR) 100 MG tablet Take 1 tablet (100 mg total) by mouth daily. 30 tablet 3  . pantoprazole (PROTONIX) 40 MG tablet Take 1 tablet (40 mg total) by mouth daily as needed. (Patient taking differently: Take 40 mg by mouth daily as needed (heartburn). ) 90 tablet 3   Allergies Allergies  Allergen Reactions  . Codeine Phosphate Other (See Comments)    Seen odd things crawling up wall  . Lexapro [Escitalopram Oxalate] Other (See Comments)    shaking    Family History Family History  Problem Relation Age of Onset  . Heart attack Mother   .  Heart attack Father   . Heart attack Brother        "leaky valve"  . Neuromuscular disorder Neg Hx     Review of Systems: Constitutional:  no fevers or chills Eye:  no recent significant change in vision Ear/Nose/Mouth/Throat:  Ears:  no recent hearing loss Nose/Mouth/Throat:  no complaints of nasal congestion or sore throat Cardiovascular:  no chest pain, no palpitations Respiratory:  no cough and no shortness of breath Gastrointestinal:  no abdominal pain, no change in bowel habits GU:  Male: negative for dysuria, frequency, and incontinence and negative for prostate symptoms Musculoskeletal/Extremities:  no pain, redness, or swelling of the joints Integumentary (Skin):  no abnormal skin lesions reported Neurologic:  no headaches, Endocrine:  No unexpected weight changes Hematologic/Lymphatic:  no areas of easy bruising  Exam BP 130/78 (BP Location: Left Arm, Patient Position: Sitting, Cuff Size: Normal)   Pulse 88   Temp 98.8 F (37.1 C) (Oral)   Ht 6' (1.829 m)   Wt 200 lb (90.7 kg)   SpO2 98%   BMI 27.12 kg/m  General:  well developed, well nourished, in no apparent distress Skin:  no significant moles, warts, or growths Head:  no masses, lesions, or tenderness Eyes:  pupils equal and round, sclera anicteric without injection Ears:  canals  without lesions, TMs shiny without retraction, no obvious effusion, no erythema Nose:  nares patent, septum midline, mucosa normal Throat/Pharynx:  lips and gingiva without lesion; tongue and uvula midline; non-inflamed pharynx; no exudates or postnasal drainage Neck: neck supple without adenopathy, thyromegaly, or masses Lungs:  clear to auscultation, breath sounds equal bilaterally, no respiratory distress Cardio:  regular rate and rhythm, no LE edema or bruits Abdomen:  abdomen soft, nontender; bowel sounds normal; no masses or organomegaly Rectal: Deferred Musculoskeletal: No TTP; 4/5 strength with knee flexion and hip flexion  b/l. 5/5 strength throughout otherwise; symmetrical muscle groups noted without atrophy or deformity Extremities:  no clubbing, cyanosis, or edema, no deformities, no skin discoloration Neuro:  gait normal; deep tendon reflexes normal and symmetric Psych: well oriented with normal range of affect and appropriate judgment/insight  Assessment and Plan  Well adult exam - Plan: CBC, Comprehensive metabolic panel, Lipid panel  Weakness of both lower extremities - Plan: T4, free, B12, call neurology team. PT.    Well 83 y.o. male. Counseled on diet and exercise. Other orders as above. Stretches/exercises, refill tramadol for breakthrough pain, heat, ice, PT. Call neurologist again.  Follow up in in 6 mo w me for med ck.  The patient voiced understanding and agreement to the plan.  [place], DO 04/21/19 1:01 PM

## 2019-04-21 NOTE — Patient Instructions (Signed)
Call Dr. Jaynee Eagles for an appointment.   [facility] will be reaching out regarding the physical therapy.  OK to take Tylenol 1000 mg (2 extra strength tabs) or 975 mg (3 regular strength tabs) every 6 hours as needed.  Heat (pad or rice pillow in microwave) over affected area, 10-15 minutes twice daily.   Gluteus Medius Syndrome Rehab It is normal to feel mild stretching, pulling, tightness, or discomfort as you do these exercises, but you should stop right away if you feel sudden pain or your pain gets worse.   Stretching and range of motion exercise This exercise warms up your muscles and joints and improves the movement and flexibility of your hip and pelvis. This exercise also helps to relieve pain and stiffness. Exercise A: Lunge (hip flexor stretch)     1. Kneel on the floor on your left / right knee. Bend your other knee so it is directly over your ankle. 2. Keep good posture with your head over your shoulders. Tuck your tailbone underneath you. This will prevent your back from arching too much. 3. You should feel a gentle stretch in the front of your thigh or hip. If you do not feel a stretch, slowly lunge forward with your chest up. 4. Hold this position for 30 seconds. 5. Slowly return to the starting position. Repeat 2 times. Complete this exercise 3 times per week. Strengthening exercises These exercises build strength and endurance in your hip and pelvis. Endurance is the ability to use your muscles for a long time, even after they get tired. Exercise B: Bridge (hip extensors)    1. Lie on your back on a firm surface with your knees bent and your feet flat on the floor. 2. Tighten your buttocks muscles and lift your bottom off the floor until the trunk of your body is level with your thighs. ? You should feel the muscles working in your buttocks and the back of your thighs. If this exercise is too easy, cross your arms over your chest or lift one leg while your bottom is up off  the floor. ? Do not arch your back. 3. Hold this position for 3 seconds. 4. Slowly lower your hips to the starting position. 5. Let your muscles relax completely between repetitions. Repeat 2 times. Complete this exercise 3 times per week. Exercise C: Straight leg raises (hip abductors)    1. Lie on your side with your left / right leg in the top position. Lie so your head, shoulder, knee, and hip line up. Bend your bottom knee to help you balance. 2. Lift your top leg up 4-6 inches (10-15 cm), keeping your toes pointed straight ahead. 3. Hold this position for 2 seconds. 4. Slowly lower your leg to the starting position and let your muscles relax completely. Repeat for a total of 10 repetitions. Repeat 2 times. Complete this exercise 3 times per week. Exercise D: Hip abductors and external rotators, quadruped 1. Get on your hands and knees on a firm, lightly padded surface. Your hands should be directly below your shoulders, and your knees should be directly below your hips. 2. Lift your left / right knee out to the side. Keep your knee bent. Do not twist your body. 3. Hold this position for 3 seconds. 4. Slowly lower your leg. Repeat for a total of 10 repetitions.  Repeat 2 times. Complete this exercise 3 times per week. Exercise E: Single leg stand 1. Stand near a counter or door frame to  hold onto as needed. It is helpful to look in a mirror for this exercise so you can watch your hip. 2. Squeeze your left / right buttock muscles then lift up your other foot. Do not let your left / righthip push out to the side. 3. Hold this position for 3 seconds. Repeat for a total of 10 repetitions. Repeat 2 times. Complete this exercise 3 times per week. Make sure you discuss any questions you have with your health care provider. Document Released: 10/21/2005 Document Revised: 06/27/2016 Document Reviewed: 10/03/2015 Elsevier Interactive Patient Education  2018 [employer]  Syndrome Rehab It is normal to feel mild stretching, pulling, tightness, or discomfort as you do these exercises, but you should stop right away if you feel sudden pain or your pain gets worse.  Stretching and range of motion exercises These exercises warm up your muscles and joints and improve the movement and flexibility of your hip and pelvis. Exercise A: Quadriceps, prone    1. Lie on your abdomen on a firm surface, such as a bed or padded floor. 2. Bend your left / right knee and hold your ankle. If you cannot reach your ankle or pant leg, loop a belt around your foot and grab the belt instead. 3. Gently pull your heel toward your buttocks. Your knee should not slide out to the side. You should feel a stretch in the front of your thigh and knee. 4. Hold this position for 30 seconds. Repeat 2 times. Complete this stretch 3 times per week. Exercise B: Iliotibial band    1. Lie on your side with your left / right leg in the top position. 2. Bend both of your knees and grab your left / right ankle. Stretch out your bottom arm to help you balance. 3. Slowly bring your top knee back so your thigh goes behind your trunk. 4. Slowly lower your top leg toward the floor until you feel a gentle stretch on the outside of your left / right hip and thigh. If you do not feel a stretch and your knee will not fall farther, place the heel of your other foot on top of your knee and pull your knee down toward the floor with your foot. 5. Hold this position for 30 seconds. Repeat 2 times. Complete this stretch 3 times per week. Strengthening exercises These exercises build strength and endurance in your hip and pelvis. Endurance is the ability to use your muscles for a long time, even after they get tired. Exercise C: Straight leg raises (hip abductors)     1. Lie on your side with your left / right leg in the top position. Lie so your head, shoulder, knee, and hip line up. You may bend your bottom knee to  help you balance. 2. Roll your hips slightly forward so your hips are stacked directly over each other and your left / right knee is facing forward. 3. Tense the muscles in your outer thigh and lift your top leg 4-6 inches (10-15 cm). 4. Hold this position for 3 seconds. Repeat for a total of 10 reps. 5. Slowly return to the starting position. Let your muscles relax completely before doing another repetition. Repeat 2 times. Complete this exercise 3 times per week. Exercise D: Straight leg raises (hip extensors) 1. Lie on your abdomen on your bed or a firm surface. You can put a pillow under your hips if that is more comfortable. 2. Bend your left /  right knee so your foot is straight up in the air. 3. Squeeze your buttock muscles and lift your left / right thigh off the bed. Do not let your back arch. 4. Tense this muscle as hard as you can without increasing any knee pain. 5. Hold this position for 2 seconds. Repeat for a total of 10 reps 6. Slowly lower your leg to the starting position and allow it to relax completely. Repeat 2 times. Complete this exercise 3 times per week. Exercise E: Hip hike 1. Stand sideways on a bottom step. Stand on your left / right leg with your other foot unsupported next to the step. You can hold onto the railing or wall if needed for balance. 2. Keep your knees straight and your torso square. Then, lift your left / right hip up toward the ceiling. 3. Slowly let your left / right hip lower toward the floor, past the starting position. Your foot should get closer to the floor. Do not lean or bend your knees. Repeat 2 times. Complete this exercise 3 times per week.  Document Released: 10/21/2005 Document Revised: 06/25/2016 Document Reviewed: 09/22/2015 Elsevier Interactive Patient Education  [employer].

## 2019-04-28 DIAGNOSIS — M5432 Sciatica, left side: Secondary | ICD-10-CM | POA: Diagnosis not present

## 2019-04-28 DIAGNOSIS — M5431 Sciatica, right side: Secondary | ICD-10-CM | POA: Diagnosis not present

## 2019-04-28 DIAGNOSIS — M545 Low back pain: Secondary | ICD-10-CM | POA: Diagnosis not present

## 2019-04-28 DIAGNOSIS — M2560 Stiffness of unspecified joint, not elsewhere classified: Secondary | ICD-10-CM | POA: Diagnosis not present

## 2019-04-30 DIAGNOSIS — M545 Low back pain: Secondary | ICD-10-CM | POA: Diagnosis not present

## 2019-04-30 DIAGNOSIS — M2560 Stiffness of unspecified joint, not elsewhere classified: Secondary | ICD-10-CM | POA: Diagnosis not present

## 2019-04-30 DIAGNOSIS — M5431 Sciatica, right side: Secondary | ICD-10-CM | POA: Diagnosis not present

## 2019-04-30 DIAGNOSIS — M5432 Sciatica, left side: Secondary | ICD-10-CM | POA: Diagnosis not present

## 2019-05-01 IMAGING — CT CT ABD-PELV W/ CM
2 of 5 series · 16 of 46 positions shown, 18 images · IV contrast (iopamidol)
Comparison: CT abdomen pelvis 03/19/2018

CLINICAL DATA: Patient with right flank pain. Known right renal
cyst.

EXAM:
CT ABDOMEN AND PELVIS WITH CONTRAST
TECHNIQUE: Multidetector CT imaging of the abdomen and pelvis was performed
using the standard protocol following bolus administration of
intravenous contrast.
CONTRAST:  80mL [id number] IOPAMIDOL ([id number]) INJECTION 61%

[Series 2: axial st · axial · 0.85mm/px · z∈[+1111,+1591]mm · 13 of 110 slices shown, 15 images]
[im 7/110  soft-tissue]
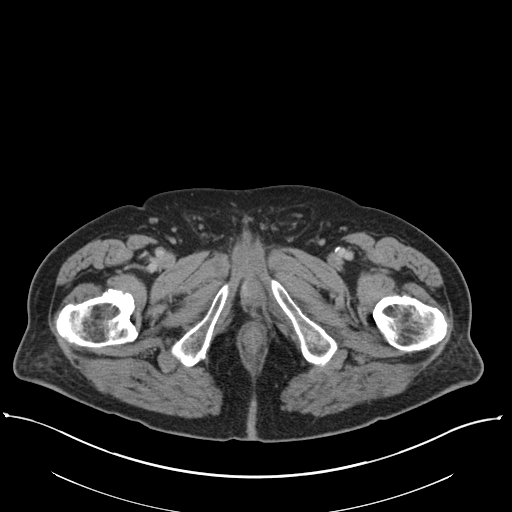
[im 7/110  bone]
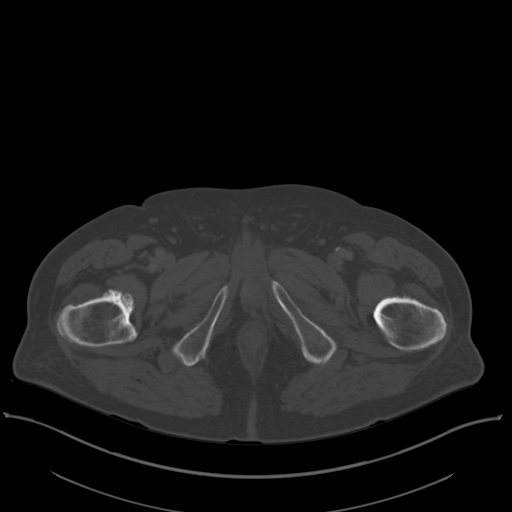
[im 14/110  soft-tissue]
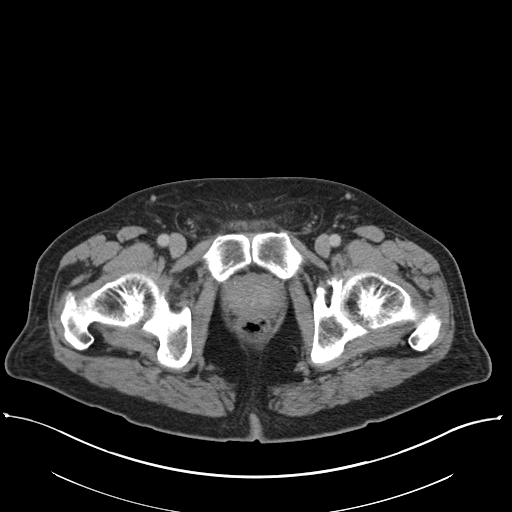
[im 21/110  soft-tissue]
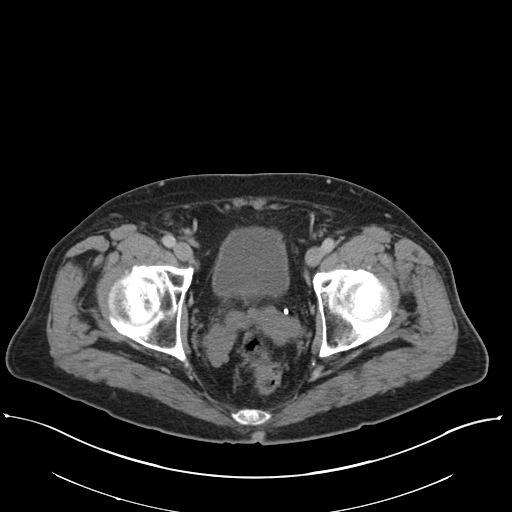
[im 35/110  soft-tissue]
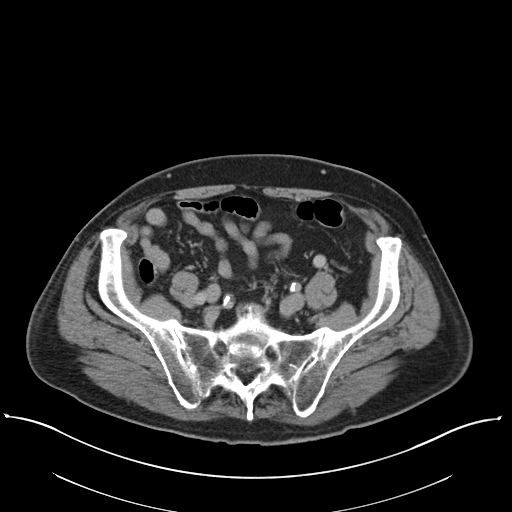
[im 41/110  soft-tissue]
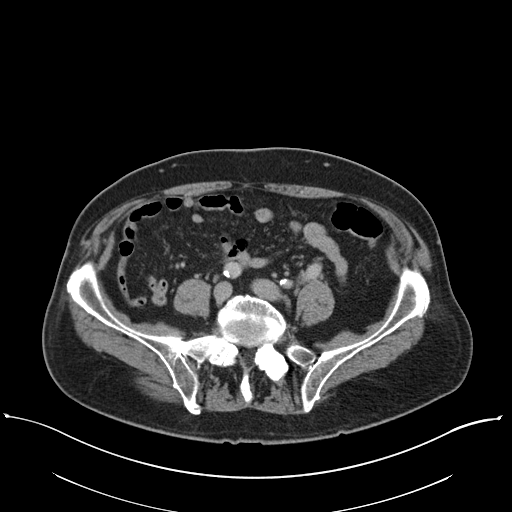
[im 48/110  soft-tissue]
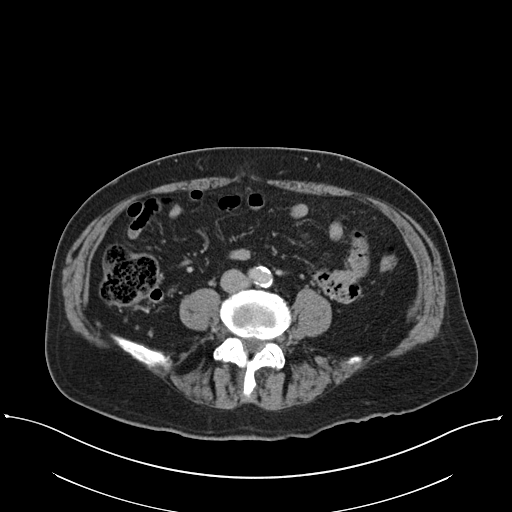
[im 55/110  soft-tissue]
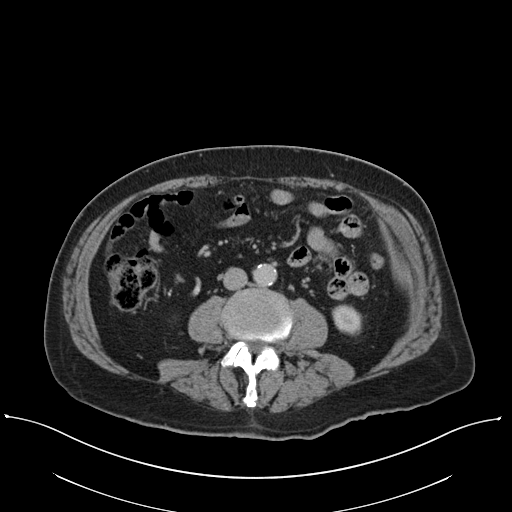
[im 62/110  soft-tissue]
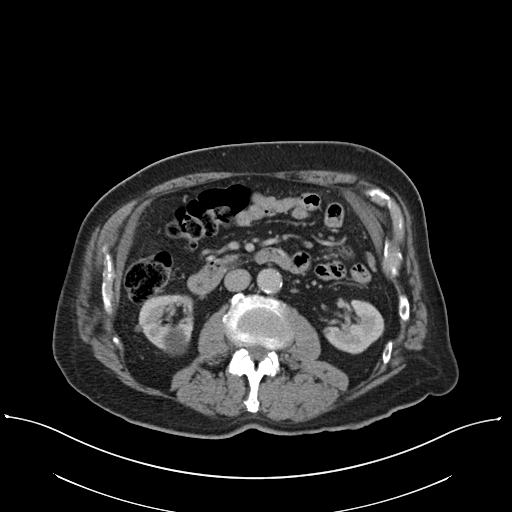
[im 69/110  soft-tissue]
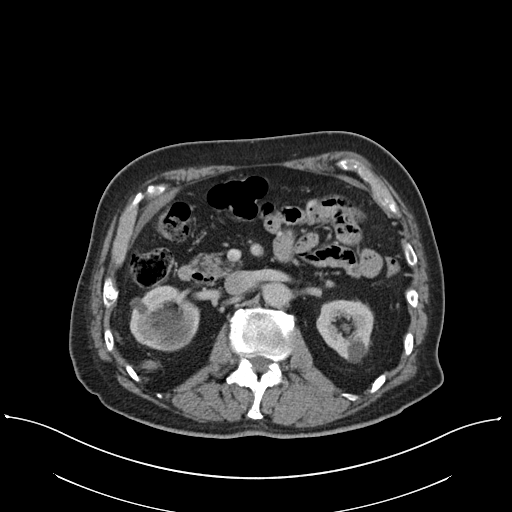
[im 69/110  bone]
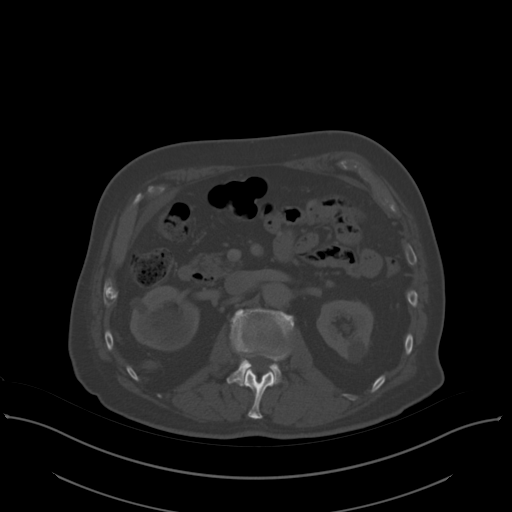
[im 75/110  soft-tissue]
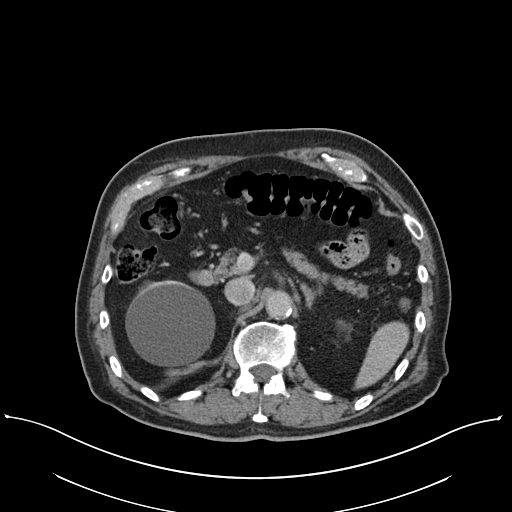
[im 89/110  soft-tissue]
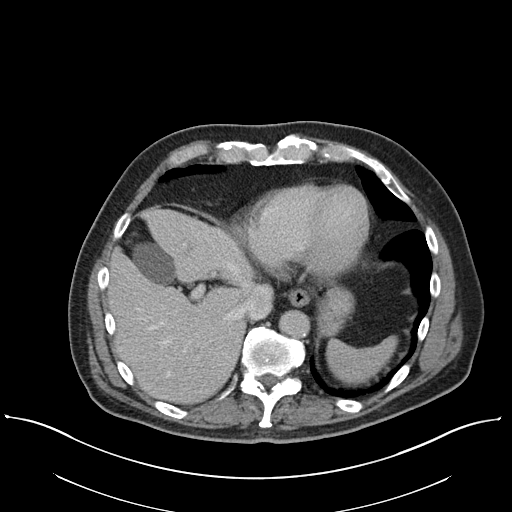
[im 96/110  soft-tissue]
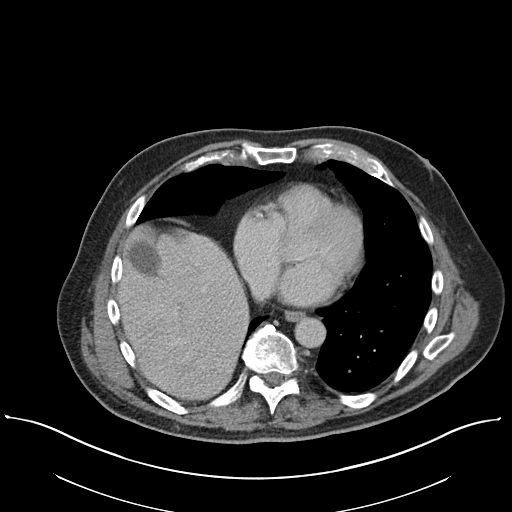
[im 103/110  soft-tissue]
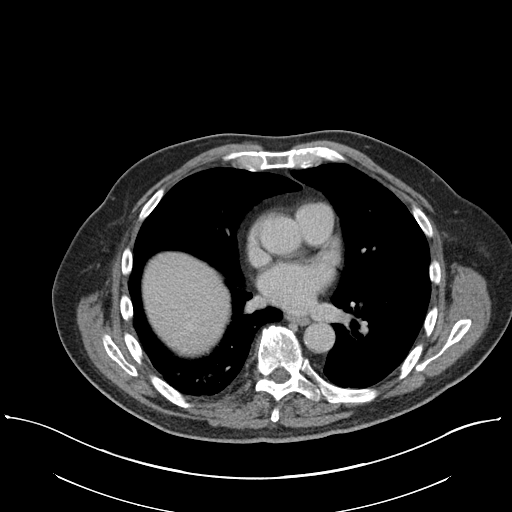

[Series 5: coronal st · coronal · 0.91mm/px · 3 of 99 slices shown]
[im 33/99  soft-tissue]
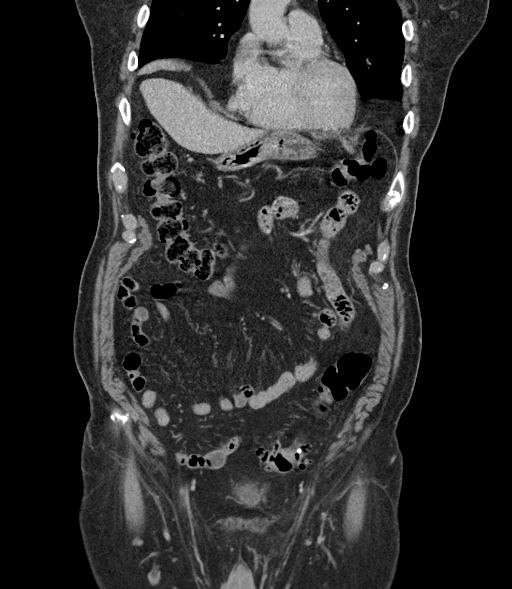
[im 44/99  soft-tissue]
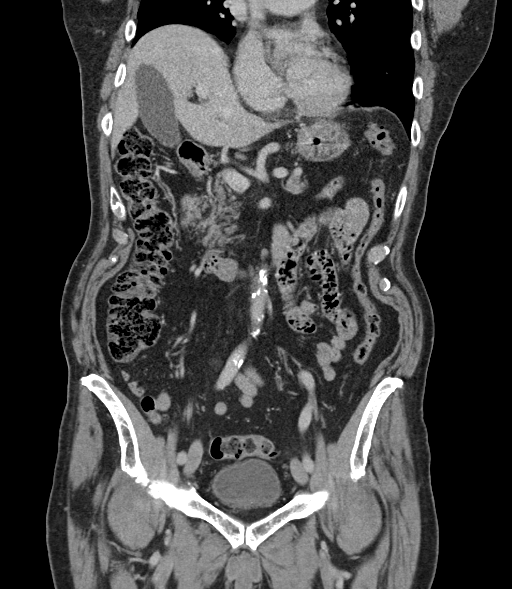
[im 55/99  soft-tissue]
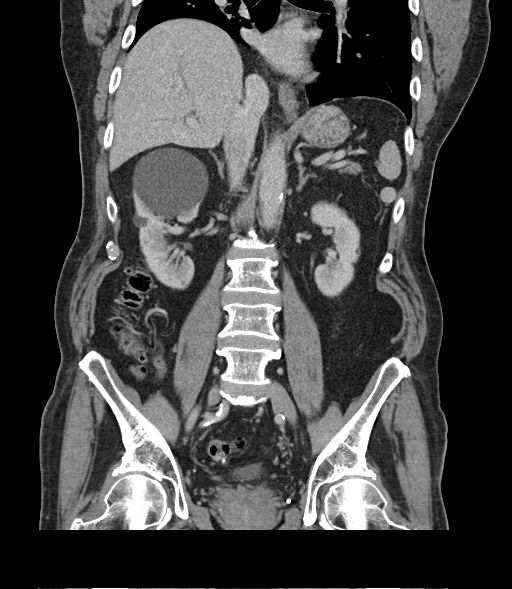

[16 of 46 positions shown; findings below may reference images not displayed]

FINDINGS: Lower chest: Normal heart size. Dependent atelectasis right lower
lobe. No pleural effusion.

Hepatobiliary: Liver is normal in size and contour. No focal lesion
is identified. Gallbladder is unremarkable. No intrahepatic or
extrahepatic biliary ductal dilatation.

Pancreas: Unremarkable

Spleen: Unremarkable

Adrenals/Urinary Tract: Adrenal glands are normal. Kidneys enhance
symmetrically with contrast. Bilateral renal cysts measuring up to 7
cm within the superior pole of the right kidney. Additional smaller
cysts are demonstrated within the right and left kidneys.
Additionally, there are too small to characterize subcentimeter
low-attenuation lesions. Urinary bladder is unremarkable.

Stomach/Bowel: Sigmoid colonic diverticulosis. No CT evidence for
acute diverticulitis. Normal appendix. No evidence for small bowel
obstruction. No free fluid or free intraperitoneal air. Descending
duodenum diverticulum.

Vascular/Lymphatic: Normal caliber abdominal aorta. Peripheral
calcified atherosclerotic plaque. No retroperitoneal
lymphadenopathy.

Reproductive: Prostate unremarkable.

Other: Small bilateral fat containing inguinal hernias,
left-greater-than-right.

Musculoskeletal: Lumbar spine degenerative changes. No aggressive or
acute appearing osseous lesions.
IMPRESSION: 1. No acute process within the abdomen or pelvis.
2. Heterogeneous opacities right lower lobe favored to represent
atelectasis.

## 2019-05-03 DIAGNOSIS — M5432 Sciatica, left side: Secondary | ICD-10-CM | POA: Diagnosis not present

## 2019-05-03 DIAGNOSIS — M2560 Stiffness of unspecified joint, not elsewhere classified: Secondary | ICD-10-CM | POA: Diagnosis not present

## 2019-05-03 DIAGNOSIS — M5431 Sciatica, right side: Secondary | ICD-10-CM | POA: Diagnosis not present

## 2019-05-03 DIAGNOSIS — M545 Low back pain: Secondary | ICD-10-CM | POA: Diagnosis not present

## 2019-05-04 ENCOUNTER — Telehealth: Payer: Self-pay | Admitting: Neurology

## 2019-05-04 NOTE — Telephone Encounter (Signed)
Alex Dunn, I know patient well. I'm not sure if he has any new concerns but would you mind calling him? if this is a routing follow up I am happy to call them by phone. If he purposefully scheduled an in office appointment to see me or for anything new, more than happy to see him in the office as well. But if this Is just a follow up to our workup (last 09/17/2018) then happy just to call him today at his appointment time and save him a trip and maybe even save him from being exposed to Covid. Thank you.

## 2019-05-05 ENCOUNTER — Ambulatory Visit: Payer: Medicare Other | Admitting: Neurology

## 2019-05-05 ENCOUNTER — Encounter: Payer: Self-pay | Admitting: Neurology

## 2019-05-05 ENCOUNTER — Other Ambulatory Visit: Payer: Self-pay

## 2019-05-05 VITALS — BP 151/77 | HR 72 | Temp 97.3°F | Ht 72.0 in | Wt 200.0 lb

## 2019-05-05 DIAGNOSIS — M79605 Pain in left leg: Secondary | ICD-10-CM

## 2019-05-05 DIAGNOSIS — M5416 Radiculopathy, lumbar region: Secondary | ICD-10-CM | POA: Diagnosis not present

## 2019-05-05 DIAGNOSIS — M79604 Pain in right leg: Secondary | ICD-10-CM | POA: Diagnosis not present

## 2019-05-05 DIAGNOSIS — L819 Disorder of pigmentation, unspecified: Secondary | ICD-10-CM | POA: Diagnosis not present

## 2019-05-05 DIAGNOSIS — R29898 Other symptoms and signs involving the musculoskeletal system: Secondary | ICD-10-CM | POA: Diagnosis not present

## 2019-05-05 MED ORDER — LIDOCAINE 5 % EX PTCH: 2.0000 | MEDICATED_PATCH | CUTANEOUS | 6 refills | Status: DC

## 2019-05-05 NOTE — Telephone Encounter (Signed)
I called patient this morning regarding his appointment this afternoon. Patient states that he does have new concerns involving trouble with his legs. Patient expressed that he would like to come in office today at 3 to discuss this.

## 2019-05-05 NOTE — Progress Notes (Signed)
° °[facility] ° ° ° °Provider:  Dr Ahern °Referring Provider: Deveshwar, Shali MD °Primary Care Physician:  Wendling, Nicholas Paul, DO ° °CC:  Low back pain and Lumbar radiculopathy ° °Interval history 05/06/2019: This is a lovely patient who I have seen in 2019 for significant fatigue and weakness.  Extensive evaluation did not show any primary neurologic causes for his symptoms.  He is here for a new issue however.  He recently has had new low back pain with radicular symptoms.  He is here with his wife who also provides much information.  He has low back pain, pain all the way down the legs to the fee and ankles. The right leg is worse. He had a relapse of fatigue and that was on April 24th and it started about 4 months, worsened since then. He saw his heatologist, thought it was the MGUS. No incited events, did not fall. He has balance problems.  He had an MRI of the lumbar spine and he has worsened. He had an MRi of the lumbar spine and they did not get the answers. He got an injection to see if it helps. More weakness in the legs.  We will have to get a CD of the lumbar spine.  The night before the shot he bent over and when he raised up he had worse pain, he has more weakness. He had severe pain since the MRI. The pain actually improved and then worsened after the shot with Dr. Ramos. He has difficulty walking a certain amount of time, he has to sit down to recuperate and walk more.better sitting and laying, worse walking.  ° °Interval history: Reviewed extensive workup with patient inclusing emg/ncs, labwork and imaging. Labs included B12 (242), heavy metals, B6, B1, MMA, lyme, MMA showed a monoclonal IgA protein and he is following with hematology. Reviewed MRi of the brain images with patient and wife, MRi brain unremarkable, MRi cervical spine with multilevel degenerative changes but normal cervical cord which rules out etiology in the lower extremities where he reports weakness as  well. EMG/NCS showed polyneuropathy but no myopathy/myositis or myasthenia gravis. MG antibodies also negative. He is feeling better, discussed no neurologic cause for his symptoms and to continue to follow with hematology and rheumatology. ° °This MRI of the brain with and without contrast shows the following: °1.     Mild generalized cortical atrophy. °2.     T2/FLAIR hyperintense foci of the hemispheres consistent with mild chronic microvascular ischemic changes °3.     Vertebrobasilar and internal carotid artery dolichoectasia. °4.     There are no acute findings and there is a normal enhancement pattern ° °IMPRESSION: This MRI of the cervical spine without contrast shows the following: °1.     The spinal cord appears normal. °2.     At C3-C4, there is borderline spinal stenosis due to degenerative changes also causing mild to moderate foraminal narrowing.  There is no nerve root compression. °3.     At C4-C5, there is mild spinal stenosis due to degenerative changes also causing mild to moderate foraminal narrowing.  There is no nerve root compression. °4.     At C7-T1, there is minimal anterolisthesis, facet hypertrophy and disc bulging.  There is no spinal stenosis or nerve root compression. °5.     There are milder degenerative changes at the other cervical levels as detailed above that do not lead to spinal stenosis or nerve root compression. °6.       There are endplate deformations involving the C3-C6 vertebral bodies most consistent with chronic osteoporotic endplate fractures. °7.     There are no acute findings. ° ° °HPI:  Altonio W Desroches is a 83 y.o. male here as requested by Dr. Deveshwar for chronic fatigue, muscle weakness.PMHx HTN, Polycystic kidney disease, fatigue, night sweats, anemia, gerd, BPH. Started in March 2019 with flu-like episode. Slowly progressive, getting more frequent and prolonged, fatigue daily generally tired all over, no headaches, no neck pain, no vision changes, no falls, no  gait changes, no focal weakness, he feels generally fatigued worse after a morning bowel movement. Feels better with steroids. No ptosis, no facial weakness, no diplopia, no swallowing difficulties. He is not sleeping well because he gets up to urinate. Night sweats improved as is the fatigue on steroids. Flu-like symptoms resolved. No change in medication, no head trauma, no FHx of neurologic problems, Worse in the mornings when waking up at 7am feel better later in the day, he doesn't feel like he sleeps. They deny snoring or other sleep apnea symptoms. No neck pain or back pain. No radicular symptoms. No tremors currently. No dysphagia. He has difficulty walking when fatigued and cant walk, he feels imbalanced. He is currently in PT.  ° °Reviewed notes, labs and imaging from outside physicians, which showed: ° °Reviewed Dr. deveshwar notes. March 2019 woke up with sweats and flu-like episodes, lasted 5-6 and now having recurrent episodes and becoming longer and more frequent. Lasting 2 weeks of fatigue and being tired. Has been evaluated by pcp, cardiology and rheumatology, cardiology workup negative, also infectious disease workup and has seen a kidney specialist. Also with arthralgias. He has gained weight.  ° °Extensive workup reviewed: 04/15/2018: CK 40, hep C negative, HIV negative, RPR negative, CRP 1.520, T4 0.89 °04/06/2018 RNP negative, Ro-, La -, Scl-70 -, Smith negative, dsDNA 32, anti-Jo 1 negative, burgdorferi antibody negative  °05/06/18: EBV IgM negative, Hgb A1c 5.6, Hep B -, ACTH 13, Cortisol 8.7, RF <14, CH50 >60, ANA 1:80 Homogenous °May 2019 ESR 6, PPD negative °March 2019 vitamin D 16.71 ° °All negative except ANA and DSDNA+, Positive ANA (antinuclear antibody) - ANA 1:80 Homogenous, RF -, Ro-, La-, smith -, RNP-, CH50 >60, CK 40, dsDNA 32, Anti-Jo 1 negative, Scl-70 negative ° °TSH nml ° °Review of Systems: °Patient complains of symptoms per HPI as well as the following symptoms: insomnia,  sleepiness, weakness, decreased energy, leg pain, worsening fatigue. Pertinent negatives and positives per HPI. All others negative. ° ° °Social History  ° °Socioeconomic History  °• Marital status: Married  °  Spouse name: Not on file  °• Number of children: 1  °• Years of education: Not on file  °• Highest education level: High school graduate  °Occupational History  °• Not on file  °Social Needs  °• Financial resource strain: Not on file  °• Food insecurity  °  Worry: Not on file  °  Inability: Not on file  °• Transportation needs  °  Medical: Not on file  °  Non-medical: Not on file  °Tobacco Use  °• Smoking status: Never Smoker  °• Smokeless tobacco: Never Used  °Substance and Sexual Activity  °• Alcohol use: Never  °  Frequency: Never  °• Drug use: Never  °• Sexual activity: Not on file  °Lifestyle  °• Physical activity  °  Days per week: Not on file  °  Minutes per session: Not on file  °• Stress: Not on   file  Relationships   Social connections    Talks on phone: Not on file    Gets together: Not on file    Attends religious service: Not on file    Active member of club or organization: Not on file    Attends meetings of clubs or organizations: Not on file    Relationship status: Not on file   Intimate partner violence    Fear of current or ex partner: Not on file    Emotionally abused: Not on file    Physically abused: Not on file    Forced sexual activity: Not on file  Other Topics Concern   Not on file  Social History Narrative   Lives at home with his wife   Retired   Right handed   Caffeine: about 2 cups daily    Family History  Problem Relation Age of Onset   Heart attack Mother    Heart attack Father    Heart attack Brother        "leaky valve"   Neuromuscular disorder Neg Hx     Past Medical History:  Diagnosis Date   ANEMIA-IRON DEFICIENCY 06/23/2007   BENIGN PROSTATIC HYPERTROPHY 06/23/2007   Diverticulosis    GERD 06/23/2007   Hypertension     Polycystic kidney disease    stage 3 per wife    Past Surgical History:  Procedure Laterality Date   HAND SURGERY     LUNG SURGERY     for collapsed lung    Current Outpatient Medications  Medication Sig Dispense Refill   Acetaminophen (TYLENOL PO) Take by mouth as needed.     lidocaine (LIDODERM) 5 % Place 2 patches onto the skin daily. Remove & Discard patch within 12 hours or as directed by MD. Use on the low back. 60 patch 6   losartan (COZAAR) 100 MG tablet Take 1 tablet (100 mg total) by mouth daily. 30 tablet 3   Multiple Vitamins-Minerals (ICAPS AREDS 2 PO) Take 1 each by mouth daily.     pantoprazole (PROTONIX) 40 MG tablet Take 1 tablet (40 mg total) by mouth daily as needed. (Patient taking differently: Take 40 mg by mouth daily as needed (heartburn). ) 90 tablet 3   traMADol (ULTRAM) 50 MG tablet Take 50 mg by mouth as needed.     VITAMIN D PO Take 2,000 Int'l Units by mouth daily.     No current facility-administered medications for this visit.     Allergies as of 05/05/2019 - Review Complete 05/05/2019  Allergen Reaction Noted   Codeine phosphate Other (See Comments)    Lexapro [escitalopram oxalate] Other (See Comments) 05/03/2018    Vitals: BP (!) 151/77 (BP Location: Left Arm, Patient Position: Sitting)    Pulse 72    Temp (!) 97.3 F (36.3 C) Comment: wife 96.6, taken by front staff   Ht 6' (1.829 m)    Wt 200 lb (90.7 kg)    BMI 27.12 kg/m  Last Weight:  Wt Readings from Last 1 Encounters:  05/05/19 200 lb (90.7 kg)   Last Height:   Ht Readings from Last 1 Encounters:  05/05/19 6' (1.829 m)    Physical exam: Exam: Gen: NAD, conversant                    CV: RRR, no MRG. No Carotid Bruits. No peripheral edema, warm, nontender Eyes: Conjunctivae clear without exudates or hemorrhage Skin: His lower legs appear ruddy distally, worse with dependency,  decreased hair growth, no sores or rashes per se but he does have discoloration in his lower  legs and around the ankles of a reddish hue.  Neuro: Detailed Neurologic Exam  Speech:    Speech is normal; fluent and spontaneous with normal comprehension.  Cognition:    The patient is oriented to person, place, and time;     recent and remote memory intact;     language fluent;     normal attention, concentration,     fund of knowledge Cranial Nerves:    The pupils are equal, round, and reactive to light. Attempted fundoscopic exam could not visualize. . Visual fields are full to finger confrontation. Extraocular movements are intact. Trigeminal sensation is intact and the muscles of mastication are normal. The face is symmetric. The palate elevates in the midline. Hearing intact. Voice is normal. Shoulder shrug is normal. The tongue has normal motion without fasciculations.   Coordination:    Normal finger to nose   Gait:    Cannot get up without using his hands. slightly antalgi and cautious.    Motor Observation:    No asymmetry, no atrophy, and mild postural and action tremor Tone:    Normal muscle tone.    Posture:    Posture is slightly stooped    Strength:    Strength is V/V in the upper and lower limbs.      Sensation: intact to LT     Reflex Exam:  DTR's:    Deep tendon reflexes in the lowers are trace on the left and 1+ on the right.  Toes:    The toes are equivocal bilaterally.   Clonus:    Clonus is absent.      Assessment/Plan:   83 y.o. male here as requested by Dr. Estanislado Pandy for chronic fatigue, muscle weakness.PMHx HTN, Polycystic kidney disease, fatigue, night sweats, anemia, gerd, BPH. Started in March 2019 with flu-like episode. Slowly progressive, getting more frequent and prolonged, fatigue daily generally tired all over, he has brisk reflexes for age, imbalance.  Extensive labs, MRIs of the brain and cervical spine, EMG nerve conduction study last year did not show any primary neurologic disorder to explain this.  He is here today for new  symptoms.   - He has a new problem today appears to be lumbar radiculopathy.  Is already been evaluated at [facility] and been treated with epidural steroid injections unfortunately his symptoms have not improved.  I will request a copy of an MRI of the lumbar spine and evaluate and call patient.    - Additionally he should have vascular studies for the pain in his legs.  His lower legs appear ruddy distally, worse with dependency, decreased hair growth, no sores or rashes per se but he does have discoloration in his lower legs and around the ankles of a reddish hue.  He has already discussed this with his primary care and I encouraged him to follow-up.  Prior evaluation:  extensive workup including emg/ncs, labwork and imaging was completed last year. Labs included B12 (242), heavy metals, B6, B1, MMA, lyme, MMA showed a monoclonal IgA protein and he is following with hematology. Reviewed MRi of the brain images with patient and wife, MRi brain unremarkable, MRi cervical spine with multilevel degenerative changes but normal cervical cord which rules out etiology in the lower extremities where he reports weakness as well. EMG/NCS showed polyneuropathy but no myopathy/myositis or myasthenia gravis. MG antibodies also negative.  We again discussed no  neurologic cause for his symptoms were found in the past and to continue to follow with hematology and rheumatology.  This MRI of the brain with and without contrast shows the following: 1.     Mild generalized cortical atrophy. 2.     T2/FLAIR hyperintense foci of the hemispheres consistent with mild chronic microvascular ischemic changes 3.     Vertebrobasilar and internal carotid artery dolichoectasia. 4.     There are no acute findings and there is a normal enhancement pattern  IMPRESSION: This MRI of the cervical spine without contrast shows the following: 1.     The spinal cord appears normal. 2.     At C3-C4, there is borderline spinal stenosis  due to degenerative changes also causing mild to moderate foraminal narrowing.  There is no nerve root compression. 3.     At C4-C5, there is mild spinal stenosis due to degenerative changes also causing mild to moderate foraminal narrowing.  There is no nerve root compression. 4.     At C7-T1, there is minimal anterolisthesis, facet hypertrophy and disc bulging.  There is no spinal stenosis or nerve root compression. 5.     There are milder degenerative changes at the other cervical levels as detailed above that do not lead to spinal stenosis or nerve root compression. 6.     There are endplate deformations involving the C3-C6 vertebral bodies most consistent with chronic osteoporotic endplate fractures. 7.     There are no acute findings.   MRI brain w/wo contrast and cervical spine for strokes, myelopathy, other lesions, metastasis  EMG/NCS  - Low likelihood of myasthenia gravis, no diplopia or ptosis, no proximal weakness, he is worse in the morning which is not consistent with myasthenia gravis. Pending AchR antibodies. Will do repetitive nerve studies - emg/ncs to look for neuromuscular disease one arm and leg and RNS of one arm.  - Labs today  Sarina Ill, MD  [facility] [address] [place], [place] [address]  Phone [phone] Fax [phone]  A total of 40 minutes was spent face-to-face with this patient. Over half this time was spent on counseling patient on the  1. Pain in both lower extremities   2. Weakness of both lower extremities   3. Lumbar radiculopathy   4. Discoloration of skin of lower leg    diagnosis and different diagnostic and therapeutic options, counseling and coordination of care, risks ans benefits of management, compliance, or risk factor reduction and education.

## 2019-05-05 NOTE — Patient Instructions (Signed)
Lidoderm patches Review the MRI lumbar spine and call you Have a vascular evaluation of blood vessels in the legs   Radicular Pain Radicular pain is a type of pain that spreads from your back or neck along a spinal nerve. Spinal nerves are nerves that leave the spinal cord and go to the muscles. Radicular pain is sometimes called radiculopathy, radiculitis, or a pinched nerve. When you have this type of pain, you may also have weakness, numbness, or tingling in the area of your body that is supplied by the nerve. The pain may feel sharp and burning. Depending on which spinal nerve is affected, the pain may occur in the:  Neck area (cervical radicular pain). You may also feel pain, numbness, weakness, or tingling in the arms.  Mid-spine area (thoracic radicular pain). You would feel this pain in the back and chest. This type is rare.  Lower back area (lumbar radicular pain). You would feel this pain as low back pain. You may feel pain, numbness, weakness, or tingling in the buttocks or legs. Sciatica is a type of lumbar radicular pain that shoots down the back of the leg. Radicular pain occurs when one of the spinal nerves becomes irritated or squeezed (compressed). It is often caused by something pushing on a spinal nerve, such as one of the bones of the spine (vertebrae) or one of the round cushions between vertebrae (intervertebral disks). This can result from:  An injury.  Wear and tear or aging of a disk.  The growth of a bone spur that pushes on the nerve. Radicular pain often goes away when you follow instructions from your health care provider for relieving pain at home. Follow these instructions at home: Managing pain      If directed, put ice on the affected area: ? Put ice in a plastic bag. ? Place a towel between your skin and the bag. ? Leave the ice on for 20 minutes, 2-3 times a day.  If directed, apply heat to the affected area as often as told by your health care  provider. Use the heat source that your health care provider recommends, such as a moist heat pack or a heating pad. ? Place a towel between your skin and the heat source. ? Leave the heat on for 20-30 minutes. ? Remove the heat if your skin turns bright red. This is especially important if you are unable to feel pain, heat, or cold. You may have a greater risk of getting burned. Activity   Do not sit or rest in bed for long periods of time.  Try to stay as active as possible. Ask your health care provider what type of exercise or activity is best for you.  Avoid activities that make your pain worse, such as bending and lifting.  Do not lift anything that is heavier than 10 lb (4.5 kg), or the limit that you are told, until your health care provider says that it is safe.  Practice using proper technique when lifting items. Proper lifting technique involves bending your knees and rising up.  Do strength and range-of-motion exercises only as told by your health care provider or physical therapist. General instructions  Take over-the-counter and prescription medicines only as told by your health care provider.  Pay attention to any changes in your symptoms.  Keep all follow-up visits as told by your health care provider. This is important. ? Your health care provider may send you to a physical therapist to help with  this pain. Contact a health care provider if:  Your pain and other symptoms get worse.  Your pain medicine is not helping.  Your pain has not improved after a few weeks of home care.  You have a fever. Get help right away if:  You have severe pain, weakness, or numbness.  You have difficulty with bladder or bowel control. Summary  Radicular pain is a type of pain that spreads from your back or neck along a spinal nerve.  When you have radicular pain, you may also have weakness, numbness, or tingling in the area of your body that is supplied by the nerve.  The  pain may feel sharp or burning.  Radicular pain may be treated with ice, heat, medicines, or physical therapy. This information is not intended to replace advice given to you by your health care provider. Make sure you discuss any questions you have with your health care provider. Document Released: 11/28/2004 Document Revised: 05/05/2018 Document Reviewed: 05/05/2018 Elsevier Patient Education  [employer].   Lidocaine dermal patch What is this medicine? LIDOCAINE (LYE doe kane) causes loss of feeling in the skin and surrounding area. The medicine helps treat pain, including nerve pain. This medicine may be used for other purposes; ask your health care provider or pharmacist if you have questions. COMMON BRAND NAME(S): Aspercreme with Lidocaine, GEN7T, Lidocare, Lidoderm, ZTlido What should I tell my health care provider before I take this medicine? They need to know if you have any of these conditions:  heart disease  history of irregular heart beat  liver disease  skin conditions or sensitivity  skin infection  an unusual or allergic reaction to lidocaine, parabens, other medicines, foods, dyes, or preservatives  pregnant or trying to get pregnant  breast-feeding How should I use this medicine? This medicine is for external use only. Follow the directions on the prescription label or package. Talk to your pediatrician regarding the use of this medicine in children. Special care may be needed. Overdosage: If you think you have taken too much of this medicine contact a poison control center or emergency room at once. NOTE: This medicine is only for you. Do not share this medicine with others. What if I miss a dose? If you miss a dose, take it as soon as you can. If it is almost time for your next dose, take only that dose. Do not take double or extra doses. What may interact with this medicine? Do not take this medicine with any of the following medications:  certain  medicines for irregular heart beat  MAOIs like Carbex, Eldepryl, Marplan, Nardil, and Parnate This medicine may also interact with the following medications:  other local anesthetics like pramoxine, tetracaine Do not use any other skin products on the affected area without asking your doctor or health care professional. This list may not describe all possible interactions. Give your health care provider a list of all the medicines, herbs, non-prescription drugs, or dietary supplements you use. Also tell them if you smoke, drink alcohol, or use illegal drugs. Some items may interact with your medicine. What should I watch for while using this medicine? Tell your doctor or healthcare professional if your symptoms do not start to get better or if they get worse. Be careful to avoid injury while the area is numb from the medicine, and you are not aware of pain. If you are going to need surgery, a MRI, CT scan, or other procedure, tell your doctor that you  are using this medicine. You may need to remove this patch before the procedure. Do not get this medicine in your eyes. If you do, rinse out with plenty of cool tap water. This medicine can make certain skin conditions worse. Only use it for conditions for which your doctor or health care professional has prescribed. What side effects may I notice from receiving this medicine? Side effects that you should report to your doctor or health care professional as soon as possible:  allergic reactions like skin rash, itching or hives, swelling of the face, lips, or tongue  breathing problems  chest pain or chest tightness  dizzines Side effects that usually do not require medical attention (report to your doctor or health care professional if they continue or are bothersome):  tingling, numbness at site where applied This list may not describe all possible side effects. Call your doctor for medical advice about side effects. You may report side  effects to FDA at 1-800-FDA-1088. Where should I keep my medicine? Keep out of the reach of children. See product for storage instructions. Each product may have different instructions. NOTE: This sheet is a summary. It may not cover all possible information. If you have questions about this medicine, talk to your doctor, pharmacist, or health care provider.  2020 Elsevier/Gold Standard (2017-07-23 22:50:48)

## 2019-05-06 ENCOUNTER — Telehealth: Payer: Self-pay | Admitting: Neurology

## 2019-05-06 DIAGNOSIS — M545 Low back pain: Secondary | ICD-10-CM | POA: Diagnosis not present

## 2019-05-06 DIAGNOSIS — M5416 Radiculopathy, lumbar region: Secondary | ICD-10-CM

## 2019-05-06 DIAGNOSIS — M5431 Sciatica, right side: Secondary | ICD-10-CM | POA: Diagnosis not present

## 2019-05-06 DIAGNOSIS — M5432 Sciatica, left side: Secondary | ICD-10-CM | POA: Diagnosis not present

## 2019-05-06 DIAGNOSIS — M2560 Stiffness of unspecified joint, not elsewhere classified: Secondary | ICD-10-CM | POA: Diagnosis not present

## 2019-05-06 HISTORY — DX: Radiculopathy, lumbar region: M54.16

## 2019-05-06 NOTE — Telephone Encounter (Signed)
I made request today to [facility], request pt CD and report.

## 2019-05-06 NOTE — Telephone Encounter (Signed)
Alex Dunn patient came to the office yesterday with low back pain and radiculopathy.  He is already been evaluated at [facility].  He had a recent MRI of the lumbar spine.  Unfortunately I do not have a report, it is not in care everywhere and he did not bring a disc.  Would you be able to call [facility] please and get patients imaging particularly the MRI of the lumbar spine or any imaging this year completed on a CD so I can review it please.  Thank you.

## 2019-05-10 ENCOUNTER — Telehealth: Payer: Self-pay | Admitting: *Deleted

## 2019-05-10 NOTE — Telephone Encounter (Signed)
Completed Lidocaine 5% patch PA on CMM. KEY: [id number]. Awaiting [employer] Medicare part D determination.

## 2019-05-11 NOTE — Telephone Encounter (Signed)
Dr. Jaynee Eagles. Pt will need to purchase OTC or use goodrx coupon if he needs the 5%.

## 2019-05-11 NOTE — Telephone Encounter (Signed)
Tomi Bamberger from [facility] at [phone] option 5 stated lidocaine patch was denied. The diagnosis for the PA is not on the list approve by the insurance. The diagnosis that are approve is: prothertetic neuralgia, diabetic neuro pathetic pain, or a cancer diagnosis where opiates are ineffective.These are the FDA diagnosis for the lidocaine patch.

## 2019-05-11 NOTE — Telephone Encounter (Signed)
I spoke with the patient and let him know insurance will not cover Lidocaine 5% patch. Advised he could try either goodrx or purchase the Lidocaine patches OTC. He verbalized appreciation for the call and stated he would check into it.

## 2019-05-12 ENCOUNTER — Telehealth: Payer: Self-pay | Admitting: *Deleted

## 2019-05-12 DIAGNOSIS — M2560 Stiffness of unspecified joint, not elsewhere classified: Secondary | ICD-10-CM | POA: Diagnosis not present

## 2019-05-12 DIAGNOSIS — M545 Low back pain: Secondary | ICD-10-CM | POA: Diagnosis not present

## 2019-05-12 DIAGNOSIS — M5432 Sciatica, left side: Secondary | ICD-10-CM | POA: Diagnosis not present

## 2019-05-12 DIAGNOSIS — M5431 Sciatica, right side: Secondary | ICD-10-CM | POA: Diagnosis not present

## 2019-05-12 NOTE — Telephone Encounter (Signed)
Noted, ready for Dr. Cathren Laine review.

## 2019-05-12 NOTE — Telephone Encounter (Signed)
R/c cd from [facility], report and cd on [place] desk.

## 2019-05-14 ENCOUNTER — Other Ambulatory Visit: Payer: Self-pay

## 2019-05-14 DIAGNOSIS — R29898 Other symptoms and signs involving the musculoskeletal system: Secondary | ICD-10-CM

## 2019-05-17 DIAGNOSIS — M545 Low back pain: Secondary | ICD-10-CM | POA: Diagnosis not present

## 2019-05-17 DIAGNOSIS — M5432 Sciatica, left side: Secondary | ICD-10-CM | POA: Diagnosis not present

## 2019-05-17 DIAGNOSIS — M5431 Sciatica, right side: Secondary | ICD-10-CM | POA: Diagnosis not present

## 2019-05-17 DIAGNOSIS — M2560 Stiffness of unspecified joint, not elsewhere classified: Secondary | ICD-10-CM | POA: Diagnosis not present

## 2019-05-19 ENCOUNTER — Telehealth (HOSPITAL_COMMUNITY): Payer: Self-pay | Admitting: *Deleted

## 2019-05-19 DIAGNOSIS — M2560 Stiffness of unspecified joint, not elsewhere classified: Secondary | ICD-10-CM | POA: Diagnosis not present

## 2019-05-19 DIAGNOSIS — M5431 Sciatica, right side: Secondary | ICD-10-CM | POA: Diagnosis not present

## 2019-05-19 DIAGNOSIS — M545 Low back pain: Secondary | ICD-10-CM | POA: Diagnosis not present

## 2019-05-19 DIAGNOSIS — M5432 Sciatica, left side: Secondary | ICD-10-CM | POA: Diagnosis not present

## 2019-05-19 NOTE — Telephone Encounter (Signed)
The above patient or their representative was contacted and gave the following answers to these questions:         Do you have any of the following symptoms?no  Fever                    Cough                   Shortness of breath  Do  you have any of the following other symptoms?    muscle pain         vomiting,        diarrhea        rash         weakness        red eye        abdominal pain         bruising          bruising or bleeding              joint pain           severe headache    Have you been in contact with someone who was or has been sick in the past 2 weeks?no  Yes                 Unsure                         Unable to assess   Does the person that you were in contact with have any of the following symptoms?   Cough         shortness of breath           muscle pain         vomiting,            diarrhea            rash            weakness           fever            red eye           abdominal pain           bruising  or  bleeding                joint pain                severe headache               Have you  or someone you have been in contact with traveled internationally in th last month?    no     If yes, which countries?   Have you  or someone you have been in contact with traveled outside [place] in th last month?     no    If yes, which state and city?   COMMENTS OR ACTION PLAN FOR THIS PATIENT:          

## 2019-05-20 ENCOUNTER — Encounter (HOSPITAL_COMMUNITY): Payer: Medicare Other

## 2019-05-20 ENCOUNTER — Ambulatory Visit (INDEPENDENT_AMBULATORY_CARE_PROVIDER_SITE_OTHER): Payer: Medicare Other | Admitting: Vascular Surgery

## 2019-05-20 ENCOUNTER — Other Ambulatory Visit: Payer: Self-pay

## 2019-05-20 ENCOUNTER — Encounter: Payer: Medicare Other | Admitting: Vascular Surgery

## 2019-05-20 ENCOUNTER — Ambulatory Visit (HOSPITAL_COMMUNITY)
Admission: RE | Admit: 2019-05-20 | Discharge: 2019-05-20 | Disposition: A | Discharge status: Home / Self Care | Payer: Medicare Other | Source: Ambulatory Visit | Attending: Vascular Surgery | Admitting: Vascular Surgery

## 2019-05-20 ENCOUNTER — Encounter: Payer: Self-pay | Admitting: Vascular Surgery

## 2019-05-20 VITALS — BP 139/76 | HR 76 | Temp 97.3°F | Resp 20 | Ht 72.0 in | Wt 199.5 lb

## 2019-05-20 DIAGNOSIS — M79605 Pain in left leg: Secondary | ICD-10-CM | POA: Diagnosis not present

## 2019-05-20 DIAGNOSIS — R29898 Other symptoms and signs involving the musculoskeletal system: Secondary | ICD-10-CM | POA: Diagnosis not present

## 2019-05-20 DIAGNOSIS — M79604 Pain in right leg: Secondary | ICD-10-CM

## 2019-05-20 NOTE — Progress Notes (Signed)
Referring Physician: Dr Delilah Shan  Patient name: Alex Dunn MRN: [id number] DOB: 1936-09-27 Sex: male  REASON FOR CONSULT: Bilateral leg pain  HPI: Alex Dunn is a 83 y.o. male, with a several month history of slowly progressive increasing leg pain.  His right leg is worse than his left.  He complains that he has pain in the posterior aspect of his right leg especially when going from a lying to sitting or sitting to standing position.  He also states that first thing in the morning is when his legs are the worst.  His first step out of the bed in the morning he complains of pain that extends from his knee down to his feet bilaterally.  He does not really describe claudication.  He states that his entire body gives out after walking about 1/4 mile.  He has no rest pain.  He has no wounds on his feet.  He is undergone evaluation of his back which showed multiple areas of lumbar disc disease and also suggestion by Dr. Delilah Shan that he has an element of spinal stenosis.  He apparently was also seen by neurology who could not come up with a diagnosis for the reason for his pain.  He was sent here for further evaluation.  Other medical problems include hypertension, polycystic kidney disease with GFR 48-52.  Both of these problems are currently stable.  Patient is very debilitated and disappointed in his current walking distance.  He thinks that he will be in a wheelchair soon if he cannot get some relief.  Most recent serum creatinine June 2020 was 1.5.  Past Medical History:  Diagnosis Date  . ANEMIA-IRON DEFICIENCY 06/23/2007  . BENIGN PROSTATIC HYPERTROPHY 06/23/2007  . Diverticulosis   . GERD 06/23/2007  . Hypertension   . Polycystic kidney disease    stage 3 per wife   Past Surgical History:  Procedure Laterality Date  . HAND SURGERY    . LUNG SURGERY     for collapsed lung    Family History  Problem Relation Age of Onset  . Heart attack Mother   . Heart attack Father   .  Heart attack Brother        "leaky valve"  . Neuromuscular disorder Neg Hx     SOCIAL HISTORY: Social History   Socioeconomic History  . Marital status: Married    Spouse name: Not on file  . Number of children: 1  . Years of education: Not on file  . Highest education level: High school graduate  Occupational History  . Not on file  Social Needs  . Financial resource strain: Not on file  . Food insecurity    Worry: Not on file    Inability: Not on file  . Transportation needs    Medical: Not on file    Non-medical: Not on file  Tobacco Use  . Smoking status: Never Smoker  . Smokeless tobacco: Never Used  Substance and Sexual Activity  . Alcohol use: Never    Frequency: Never  . Drug use: Never  . Sexual activity: Not on file  Lifestyle  . Physical activity    Days per week: Not on file    Minutes per session: Not on file  . Stress: Not on file  Relationships  . Social Herbalist on phone: Not on file    Gets together: Not on file    Attends religious service: Not on file    Active  member of club or organization: Not on file    Attends meetings of clubs or organizations: Not on file    Relationship status: Not on file  . Intimate partner violence    Fear of current or ex partner: Not on file    Emotionally abused: Not on file    Physically abused: Not on file    Forced sexual activity: Not on file  Other Topics Concern  . Not on file  Social History Narrative   Lives at home with his wife   Retired   Right handed   Caffeine: about 2 cups daily    Allergies  Allergen Reactions  . Codeine Phosphate Other (See Comments)    Seen odd things crawling up wall  . Lexapro [Escitalopram Oxalate] Other (See Comments)    shaking    Current Outpatient Medications  Medication Sig Dispense Refill  . Acetaminophen (TYLENOL PO) Take by mouth as needed.    . lidocaine (LIDODERM) 5 % Place 2 patches onto the skin daily. Remove & Discard patch within 12  hours or as directed by MD. Use on the low back. 60 patch 6  . losartan (COZAAR) 100 MG tablet Take 1 tablet (100 mg total) by mouth daily. 30 tablet 3  . Multiple Vitamins-Minerals (ICAPS AREDS 2 PO) Take 1 each by mouth daily.    . pantoprazole (PROTONIX) 40 MG tablet Take 1 tablet (40 mg total) by mouth daily as needed. (Patient taking differently: Take 40 mg by mouth daily as needed (heartburn). ) 90 tablet 3  . traMADol (ULTRAM) 50 MG tablet Take 50 mg by mouth as needed.    Marland Kitchen VITAMIN D PO Take 2,000 Int'l Units by mouth daily.     No current facility-administered medications for this visit.     ROS:   General:  No weight loss, Fever, chills  HEENT: No recent headaches, no nasal bleeding, no visual changes, no sore throat  Neurologic: No dizziness, blackouts, seizures. No recent symptoms of stroke or mini- stroke. No recent episodes of slurred speech, or temporary blindness.  Cardiac: No recent episodes of chest pain/pressure, no shortness of breath at rest.  No shortness of breath with exertion.  Denies history of atrial fibrillation or irregular heartbeat  Vascular: No history of rest pain in feet.  No history of claudication.  No history of non-healing ulcer, No history of DVT   Pulmonary: No home oxygen, no productive cough, no hemoptysis,  No asthma or wheezing  Musculoskeletal:  [X]  Arthritis, [X]  Low back pain,  [X]  Joint pain  Hematologic:No history of hypercoagulable state.  No history of easy bleeding.  No history of anemia  Gastrointestinal: No hematochezia or melena,  No gastroesophageal reflux, no trouble swallowing  Urinary: [X]  chronic Kidney disease, [ ]  on HD - [ ]  MWF or [ ]  TTHS, [ ]  Burning with urination, [ ]  Frequent urination, [ ]  Difficulty urinating;   Skin: No rashes  Psychological: No history of anxiety,  No history of depression   Physical Examination  Vitals:   05/20/19 1035  BP: 139/76  Pulse: 76  Resp: 20  Temp: (!) 97.3 F (36.3 C)   TempSrc: Temporal  SpO2: 99%  Weight: 199 lb 8 oz (90.5 kg)  Height: 6' (1.829 m)    Body mass index is 27.06 kg/m.  General:  Alert and oriented, no acute distress HEENT: Normal Neck: No JVD Cardiac: Regular Rate and Rhythm  Abdomen: Soft, non-tender, non-distended, no mass Skin: No rash,  no ulcer Extremity Pulses:  2+ radial, brachial, femoral, 1-2+ dorsalis pedis, absent posterior tibial pulses bilaterally Musculoskeletal: No deformity trace bilateral ankle edema  Neurologic: Upper and lower extremity motor 5/5 and symmetric  DATA:  Patient had bilateral ABIs performed today which were greater than 1 and triphasic bilaterally  ASSESSMENT: Patient with bilateral lower extremity pain fairly debilitated by this unable to walk much more than about 1/4 mile.  On clinical exam he has good lower extremity pulses bilaterally.  His ABIs are normal.  However, he could have an element of aortoiliac occlusive disease that would not be picked up by his ABIs.  In light of this and since we have really found no other etiology for his pain we will get him scheduled for an aortoiliac duplex exam.  If this is negative I do not believe he needs any further vascular work-up.  If he does have significant aortoiliac occlusive disease we would consider an arteriogram next but we will try to avoid this with his renal dysfunction.   PLAN: Patient will be scheduled for an aortoiliac duplex within the next 2 weeks.  I will then do a virtual visit with him to discuss the results of this.   Ruta Hinds, MD [facility] Office: [phone] Pager: [phone]

## 2019-05-24 ENCOUNTER — Other Ambulatory Visit: Payer: Self-pay | Admitting: Family Medicine

## 2019-05-24 MED ORDER — TRAMADOL HCL 50 MG PO TABS: 50.0000 mg | ORAL_TABLET | ORAL | 2 refills | Status: DC | PRN

## 2019-05-24 NOTE — Telephone Encounter (Signed)
Last office visit 04/2019

## 2019-05-25 ENCOUNTER — Other Ambulatory Visit: Payer: Self-pay

## 2019-05-25 DIAGNOSIS — M79604 Pain in right leg: Secondary | ICD-10-CM

## 2019-06-01 ENCOUNTER — Encounter (HOSPITAL_COMMUNITY): Payer: Medicare Other

## 2019-06-03 ENCOUNTER — Ambulatory Visit: Payer: Medicare Other | Admitting: Vascular Surgery

## 2019-06-23 ENCOUNTER — Telehealth (HOSPITAL_COMMUNITY): Payer: Self-pay | Admitting: Rehabilitation

## 2019-06-23 NOTE — Telephone Encounter (Signed)

## 2019-06-24 ENCOUNTER — Ambulatory Visit (INDEPENDENT_AMBULATORY_CARE_PROVIDER_SITE_OTHER): Payer: Medicare Other | Admitting: Vascular Surgery

## 2019-06-24 ENCOUNTER — Ambulatory Visit (HOSPITAL_COMMUNITY)
Admission: RE | Admit: 2019-06-24 | Discharge: 2019-06-24 | Disposition: A | Discharge status: Home / Self Care | Payer: Medicare Other | Source: Ambulatory Visit | Attending: Vascular Surgery | Admitting: Vascular Surgery

## 2019-06-24 ENCOUNTER — Encounter: Payer: Self-pay | Admitting: Vascular Surgery

## 2019-06-24 ENCOUNTER — Other Ambulatory Visit: Payer: Self-pay

## 2019-06-24 DIAGNOSIS — M25561 Pain in right knee: Secondary | ICD-10-CM | POA: Diagnosis not present

## 2019-06-24 DIAGNOSIS — M25562 Pain in left knee: Secondary | ICD-10-CM

## 2019-06-24 DIAGNOSIS — M79605 Pain in left leg: Secondary | ICD-10-CM | POA: Insufficient documentation

## 2019-06-24 DIAGNOSIS — M79604 Pain in right leg: Secondary | ICD-10-CM | POA: Diagnosis not present

## 2019-06-24 NOTE — Progress Notes (Signed)
Virtual Visit via Video Note   I connected with Alex Dunn on 06/24/2019 using the phone and verified that I was speaking with the correct person using two identifiers. Patient was located at his car and accompanied by no one. I am located at our office at [facility]   The limitations of evaluation and management by telemedicine and the availability of in person appointments have been previously discussed with the patient and are documented in the patients chart. The patient expressed understanding and consented to proceed.   PCP: Shelda Pal, DO   Chief Complaint: Bilateral leg pain  History of Present Illness: Alex Dunn is a 83 y.o. male with bilateral lower extremity pain which has greatly reduced his walking distance.  He was last seen about 3 weeks ago and had normal ABIs at that office visit.  He returns today after an aortoiliac duplex scan to make sure that there is no significant inflow disease that could have been missed on his ABI exam.  Patient symptoms are unchanged.  Right leg is still the most painful.  He begins to have pain with his first step out of bed in the morning.  Pain extends below the knees and down to the feet bilaterally.  Past Medical History:  Diagnosis Date  . ANEMIA-IRON DEFICIENCY 06/23/2007  . BENIGN PROSTATIC HYPERTROPHY 06/23/2007  . Diverticulosis   . GERD 06/23/2007  . Hypertension   . Polycystic kidney disease    stage 3 per wife    Past Surgical History:  Procedure Laterality Date  . HAND SURGERY    . LUNG SURGERY     for collapsed lung    Current Meds  Medication Sig  . Acetaminophen (TYLENOL PO) Take by mouth as needed.  . lidocaine (LIDODERM) 5 % Place 2 patches onto the skin daily. Remove & Discard patch within 12 hours or as directed by MD. Use on the low back.  Marland Kitchen losartan (COZAAR) 100 MG tablet Take 1 tablet (100 mg total) by mouth daily.  . Multiple Vitamins-Minerals (ICAPS AREDS 2 PO) Take 1 each by  mouth daily.  . pantoprazole (PROTONIX) 40 MG tablet Take 1 tablet (40 mg total) by mouth daily as needed. (Patient taking differently: Take 40 mg by mouth daily as needed (heartburn). )  . traMADol (ULTRAM) 50 MG tablet Take 1 tablet (50 mg total) by mouth as needed.  Marland Kitchen VITAMIN D PO Take 2,000 Int'l Units by mouth daily.    Observations/Objective: Patient had an aortoiliac duplex exam which I reviewed and interpreted today.  Showed largest aortic diameter measurement was 2.6 cm which is within the limits of normal.  There was no evidence of aortoiliac occlusive disease.  Waveforms were triphasic to biphasic bilaterally.  Assessment and Plan: Bilateral leg pain doubtful this is vascular in nature now with normal ABIs and a normal aortoiliac duplex scan  The patient states he was to call his neurologist after his vascular evaluation.  I will forward copies of the notes to his neurologist today.  Follow Up Instructions:  Patient will follow-up on an as-needed basis.   I discussed the assessment and treatment plan with the patient. The patient was provided an opportunity to ask questions and all were answered. The patient agreed with the plan and demonstrated an understanding of the instructions.    I spent 5 minutes with the patient via phone encounter.   Signed, Ruta Hinds [facility] [place] Office: [phone]  06/24/2019, 9:17  AM

## 2019-06-28 ENCOUNTER — Telehealth: Payer: Self-pay | Admitting: *Deleted

## 2019-06-28 DIAGNOSIS — M79605 Pain in left leg: Secondary | ICD-10-CM

## 2019-06-28 DIAGNOSIS — M79604 Pain in right leg: Secondary | ICD-10-CM

## 2019-06-28 NOTE — Telephone Encounter (Signed)
I called the pt and LVM asking for call back. Called second number for pt and spoke with wife [name] (on [place]). I advised Dr. Jaynee Eagles would like to do an EMG on pt's legs. She verbalized understanding and scheduled pt for this Thurs 06/28/2019 @ 10:00 arrival 9:45 AM. She also stated pt feels "wiped out" in the mornings after a BM. She believes this may have to do with his vagus nerve. She stated she has mentioned this to other doctors at pt's appts but has not had an appt specifically for that issue. I advised her to contact PCP for an appt to address this and also let her know that I would let Dr. Jaynee Eagles know. She verbalized appreciation. Her questions were answered.

## 2019-06-28 NOTE — Telephone Encounter (Signed)
-----   Message from Melvenia Beam, MD sent at 06/27/2019  1:58 PM EDT ----- Can we set patient up with emg/ncs bilateral lowers please? Please call patient and schedule for first available thanks ----- Message ----- From: Elam Dutch, MD Sent: 06/24/2019   9:24 AM EDT To: Melvenia Beam, MD, #

## 2019-07-01 ENCOUNTER — Other Ambulatory Visit: Payer: Self-pay

## 2019-07-01 ENCOUNTER — Ambulatory Visit: Payer: Medicare Other | Admitting: Neurology

## 2019-07-01 ENCOUNTER — Ambulatory Visit (INDEPENDENT_AMBULATORY_CARE_PROVIDER_SITE_OTHER): Payer: Medicare Other | Admitting: Neurology

## 2019-07-01 DIAGNOSIS — G6181 Chronic inflammatory demyelinating polyneuritis: Secondary | ICD-10-CM

## 2019-07-01 DIAGNOSIS — Z0289 Encounter for other administrative examinations: Secondary | ICD-10-CM

## 2019-07-01 DIAGNOSIS — G541 Lumbosacral plexus disorders: Secondary | ICD-10-CM

## 2019-07-01 DIAGNOSIS — M79604 Pain in right leg: Secondary | ICD-10-CM | POA: Diagnosis not present

## 2019-07-01 DIAGNOSIS — M79605 Pain in left leg: Secondary | ICD-10-CM | POA: Diagnosis not present

## 2019-07-01 DIAGNOSIS — G629 Polyneuropathy, unspecified: Secondary | ICD-10-CM

## 2019-07-01 NOTE — Progress Notes (Addendum)
Full Name: Alex Dunn Gender: Male MRN #: [id number] Date of Birth: 1936/04/22    Visit Date: 07/01/2019 10:06 Age: 83 Years 64 Months Old Examining Physician: Sarina Ill, MD   History: Pain in legs not explained by MRI lumbar spine. He has pain in the back of the thigh and in the calf muscle and a lot of pain in the calf and lateral lower leg and top of the foot. Only hurts when putting pressure on the leg. Not when laying down or sitting down it goes away. He thinks the pain in the back of the leg is not the same as the lower leg pain. Legs are getting weaker and weaker. Arms are not involved all below the waist, He describes the pain as burning and his ankles hurt if he stands very long on them. R>L and he does not think it is coming from the low back. Worse is first thing in the morning when walking. Examination however shows 5/5 strength in the lower extremities. He has had extensive testing in the past including labwork, MRIs lumbar/cervical and brain, bone surveys, bone marrow biopsy.   Summary: The right peroneal motor nerve showed reduced amplitude (0.6 mV) and decreased conduction velocity (36 m/s).The left peroneal motor nerve showed reduced amplitude (0.2 mV) and decreased conduction velocity (32 m/s).  The right tibial motor nerve showed reduced amplitude (2.3 mV) and decreased conduction velocity (32 m/s).  The left tibial motor nerve showed reduced amplitude (1.1 V) and decreased conduction velocity (36 m/s).  The right sural sensory nerve showed reduced amplitude (3 V).  The left sural sensory nerve showed reduced amplitude (3 V).  The right superficial peroneal sensory nerve showed reduced amplitude (2 V).  The left superficial peroneal sensory nerve showed reduced amplitude (3 V).  The right tibial F wave showed delayed latencies (66.5 ms).  The left tibial F wave showed delayed latencies (68.1 ms).  The right iliopsoas showed increased spontaneous activity, increased  motor unit amplitude, increased motor unit duration, 2+ polyphasic motor units, and diminished motor unit recruitment.  The right tibialis anterior showed increased spontaneous activity, diminished motor unit recruitment.  The right gastrocnemius showed increased spontaneous activity, polyphasic motor units, and reduced motor unit recruitment pattern.  The right biceps femoris (long head and short head) showed increase spontaneous activity.  The right tibialis posterior showed increased spontaneous activity, polyphasic motor units and diminished recruitment pattern.    Conclusion: There is severe length dependent, distal, axonal, sensorimotor polyneuropathy with acute/ongoing denervation and chronic neurogenic changes in distal and proximal lower extremity muscles.  No myopathic motor units were seen.  Recommend possible follow-up with four-limb study pending results of MRI of the pelvis(evaluate for plexopathy) and lumbar puncture.  MRI of the lumbar spine was unrevealing for etiology.  No acute denervation was seen in the lumbar paraspinal muscles.    Sarina Ill, M.D.  [facility] [place], [place] [address] Tel: [phone] Fax: [phone]        [facility]    Nerve / Sites Muscle Latency Ref. Amplitude Ref. Rel Amp Segments Distance Velocity Ref. Area    ms ms mV mV %  cm m/s m/s mVms  R Peroneal - EDB     Ankle EDB 5.1 ?6.5 0.6 ?2.0 100 Ankle - EDB 9   2.3     Fib head EDB 13.6  0.5  79.8 Fib head - Ankle 31 36 ?44 1.5     Pop  fossa EDB 16.4  0.4  89.9 Pop fossa - Fib head 10 36 ?44 1.7         Pop fossa - Ankle      L Peroneal - EDB     Ankle EDB 5.8 ?6.5 0.2 ?2.0 100 Ankle - EDB 9   0.8     Fib head EDB 15.7  0.1  54.5 Fib head - Ankle 32 32 ?44 0.4     Pop fossa EDB 18.9  0.2  208 Pop fossa - Fib head 10 32 ?44 0.9         Pop fossa - Ankle      R Tibial - AH     Ankle AH 4.3 ?5.8 2.3 ?4.0 100 Ankle - AH 9   5.0     Pop fossa AH 18.3  1.1  47.9 Pop  fossa - Ankle 44 32 ?41 3.6  L Tibial - AH     Ankle AH 5.2 ?5.8 1.1 ?4.0 100 Ankle - AH 9   4.6     Pop fossa AH 17.2  0.6  57 Pop fossa - Ankle 43 36 ?41 4.0             SNC    Nerve / Sites Rec. Site Peak Lat Ref.  Amp Ref. Segments Distance    ms ms V V  cm  R Sural - Ankle (Calf)     Calf Ankle 4.0 ?4.4 3 ?6 Calf - Ankle 14  L Sural - Ankle (Calf)     Calf Ankle 3.9 ?4.4 3 ?6 Calf - Ankle 14  R Superficial peroneal - Ankle     Lat leg Ankle 3.3 ?4.4 2 ?6 Lat leg - Ankle 14  L Superficial peroneal - Ankle     Lat leg Ankle 3.1 ?4.4 3 ?6 Lat leg - Ankle 14              F  Wave    Nerve F Lat Ref.   ms ms  R Tibial - AH 66.5 ?56.0  L Tibial - AH 68.1 ?56.0         EMG full       EMG Summary Table    Spontaneous MUAP Recruitment  Muscle IA Fib PSW Fasc Other Amp Dur. Poly Pattern  R. Iliopsoas Normal 3+ 3+ None _______ Increased Increased 2+ Reduced  R. Gluteus medius Normal None None None _______ Normal Normal Normal Normal  R. Gluteus maximus Normal None None None _______ Normal Normal Normal Normal  R. Lumbar paraspinals (low) Normal None None None _______ Normal Normal Normal Normal  R. Vastus medialis Normal None None None _______ Normal Normal Normal Normal  R. Tibialis anterior Normal None 2+ None _______ Increased Increased Normal Reduced  R. Gastrocnemius (Medial head) Normal None 1+ None _______ Increased Increased 3+ Reduced  R. Extensor hallucis longus Normal None None None _______ Normal Normal Normal Normal  R. Biceps femoris (long head) Normal None 1+ None _______ Normal Normal Normal Normal  R. Tibialis posterior Normal 1+ 1+ None _______ Increased Increased 2+ Reduced  R. Biceps femoris (short head) Normal None 1+ None _______ Normal Normal Normal Normal

## 2019-07-02 NOTE — Progress Notes (Signed)
See procedure note.

## 2019-07-05 NOTE — Progress Notes (Addendum)
He has pain in the back of the thigh and in the calf muscle and a lot of pain in the calf and lateral lower leg and top of the foot. Only hurts when putting pressure on the leg. Not when laying down or sitting down it goes away. He thinks the pain in the back of the leg is not the same as the lower leg pain. Legs are getting weaker and weaker. Arms are not involved all below the waist, He describes the pain as burning and his ankles hurt if he stands very long on them. R>L and he does not think it is coming from the low back. Worse is first thing in the morning when walking. Tramadol helps significantly. Severe, same on the left but not as bad the left leg hurts only from the knee down. No pain n palpation of bones or fibular head. Strength 5/5. Need MRI pelvis to rule out lumbosacral plexopathy. Lumbar paraspinals without denervation. POEMS is suspected, radiographic (skeletal survey or computed tomographic scans) studies looking for one or more osteosclerotic bone lesions should be performed.polyradiculoneuropathy: POEMS? Amyloidosis? I contacted and discussed with his oncologist but recent bone surveys and bone marrow biopsies unremarkable no suggestion his MGUS or any blood dyscrasia involved. May need Lumbar Puncture. Needs MRI pelvis to evaluate lumbosacral plexus for CIDP(clumping, enlargement of nerve roots, enhancement), malignancy, any enhancement/viral - creatinine elevated hopefully can give 1/2 dose. Will also order LP.   Orders Placed This Encounter  Procedures  . MR MRV PELVIS W WO CONTRAST  . CK  . Basic Metabolic Panel  . MAG IgM Antibodies  . CBC with Differential/Platelets  . Cryoglobulin     A total of 60 minutes was spent face-to-face with this patient. Over half this time was spent on counseling patient on the  1. Neuropathy   2. Bilateral leg pain   3. Lumbosacral plexopathy   4. CIDP (chronic inflammatory demyelinating polyneuropathy) (HCC)    diagnosis and different  diagnostic and therapeutic options, counseling and coordination of care, risks ans benefits of management, compliance, or risk factor reduction and education.  This does not include time spent on emg/ncs

## 2019-07-06 ENCOUNTER — Telehealth: Payer: Self-pay | Admitting: Neurology

## 2019-07-06 LAB — MAG IGM ANTIBODIES: MAG IgM Antibodies: <900 BTU (ref 0–999)

## 2019-07-06 LAB — BASIC METABOLIC PANEL
BUN/Creatinine Ratio: 16 (ref 10–24)
BUN: 25 mg/dL (ref 8–27)
CO2: 22 mmol/L (ref 20–29)
Calcium: 9.1 mg/dL (ref 8.6–10.2)
Chloride: 105 mmol/L (ref 96–106)
Creatinine, Ser: 1.55 mg/dL — ABNORMAL HIGH (ref 0.76–1.27)
GFR calc Af Amer: 48 mL/min/{1.73_m2} — ABNORMAL LOW (ref >59)
GFR calc non Af Amer: 41 mL/min/{1.73_m2} — ABNORMAL LOW (ref >59)
Glucose: 90 mg/dL (ref 65–99)
Potassium: 4.5 mmol/L (ref 3.5–5.2)
Sodium: 142 mmol/L (ref 134–144)

## 2019-07-06 LAB — CBC WITH DIFFERENTIAL/PLATELET
Basophils Absolute: 0.1 10*3/uL (ref 0.0–0.2)
Basos: 1 %
EOS (ABSOLUTE): 0.1 10*3/uL (ref 0.0–0.4)
Eos: 2 %
Hematocrit: 41.3 % (ref 37.5–51.0)
Hemoglobin: 13.7 g/dL (ref 13.0–17.7)
Immature Grans (Abs): 0 10*3/uL (ref 0.0–0.1)
Immature Granulocytes: 0 %
Lymphocytes Absolute: 1.5 10*3/uL (ref 0.7–3.1)
Lymphs: 24 %
MCH: 31.8 pg (ref 26.6–33.0)
MCHC: 33.2 g/dL (ref 31.5–35.7)
MCV: 96 fL (ref 79–97)
Monocytes Absolute: 0.5 10*3/uL (ref 0.1–0.9)
Monocytes: 8 %
Neutrophils Absolute: 4.2 10*3/uL (ref 1.4–7.0)
Neutrophils: 65 %
Platelets: 191 10*3/uL (ref 150–450)
RBC: 4.31 x10E6/uL (ref 4.14–5.80)
RDW: 12.5 % (ref 11.6–15.4)
WBC: 6.4 10*3/uL (ref 3.4–10.8)

## 2019-07-06 LAB — CK: Total CK: 68 U/L (ref 30–208)

## 2019-07-06 LAB — CRYOGLOBULIN

## 2019-07-06 LAB — MAG INTERPRETATION REFLEXED

## 2019-07-06 NOTE — Telephone Encounter (Signed)
I called the patient's insurance and check pre cert requirements for MRI 32440 I spoke with representative Lurline Idol who stated, pre cert is required at [id number]. I called and started authorization request. It was not approved and additional information was requested. A peer to peer has been requested at [phone]. This can be done any time before September 10th.  DW

## 2019-07-06 NOTE — Addendum Note (Signed)
Addended by: Sarina Ill B on: 07/06/2019 11:17 AM   Modules accepted: Orders

## 2019-07-08 ENCOUNTER — Other Ambulatory Visit: Payer: Self-pay | Admitting: Family Medicine

## 2019-07-08 DIAGNOSIS — I10 Essential (primary) hypertension: Secondary | ICD-10-CM

## 2019-07-08 NOTE — Telephone Encounter (Signed)
Approval BY:[id number] through Feb 27 MRI pelvis w/wo looking at the lumbosacral plexus

## 2019-07-08 NOTE — Telephone Encounter (Signed)
I called pt & LVM on cell phone asking for call back. Spoke with pt on home number. I discussed Dr. Cathren Laine full message with him. He verbalized understanding. He is amenable to having the LP completed. He understands to go to the emergency room for quicker workup if his legs starting hurting worse. He verbalized appreciation for the call.

## 2019-07-08 NOTE — Telephone Encounter (Signed)
Let patient know I am trying to get the imaging approved. His labs are looking ok. I would like to order a lumbar puncture if patient is ok with that and get some fluid from around his back for testing. If his legs start hurting a lot worse he should go to the emergency room, that is the only place we can get this workup done quickly it takes time outpatient and I apologize he has to wait for all of this. Let me know and I can order the LP thanks

## 2019-07-12 NOTE — Addendum Note (Signed)
Addended by: Sarina Ill B on: 07/12/2019 04:11 PM   Modules accepted: Level of Service

## 2019-07-12 NOTE — Procedures (Signed)
Full Name: Alex Dunn Gender: Male MRN #: [id number] Date of Birth: [birth year]    Visit Date: 07/01/2019 10:06 Age: 83 Years 10 Months Old Examining Physician: Sarina Ill, MD   History: Pain in legs not explained by MRI lumbar spine. He has pain in the back of the thigh and in the calf muscle and a lot of pain in the calf and lateral lower leg and top of the foot. Only hurts when putting pressure on the leg. Not when laying down or sitting down it goes away. He thinks the pain in the back of the leg is not the same as the lower leg pain. Legs are getting weaker and weaker. Arms are not involved all below the waist, He describes the pain as burning and his ankles hurt if he stands very long on them. R>L and he does not think it is coming from the low back. Worse is first thing in the morning when walking. Examination however shows 5/5 strength in the lower extremities. He has had extensive testing in the past including labwork, MRIs lumbar/cervical and brain, bone surveys, bone marrow biopsy.   Summary: The right peroneal motor nerve showed reduced amplitude (0.6 mV) and decreased conduction velocity (36 m/s).The left peroneal motor nerve showed reduced amplitude (0.2 mV) and decreased conduction velocity (32 m/s).  The right tibial motor nerve showed reduced amplitude (2.3 mV) and decreased conduction velocity (32 m/s).  The left tibial motor nerve showed reduced amplitude (1.1 V) and decreased conduction velocity (36 m/s).  The right sural sensory nerve showed reduced amplitude (3 V).  The left sural sensory nerve showed reduced amplitude (3 V).  The right superficial peroneal sensory nerve showed reduced amplitude (2 V).  The left superficial peroneal sensory nerve showed reduced amplitude (3 V).  The right tibial F wave showed delayed latencies (66.5 ms).  The left tibial F wave showed delayed latencies (68.1 ms).  The right iliopsoas showed increased spontaneous activity, increased  motor unit amplitude, increased motor unit duration, 2+ polyphasic motor units, and diminished motor unit recruitment.  The right tibialis anterior showed increased spontaneous activity, diminished motor unit recruitment.  The right gastrocnemius showed increased spontaneous activity, polyphasic motor units, and reduced motor unit recruitment pattern.  The right biceps femoris (long head and short head) showed increase spontaneous activity.  The right tibialis posterior showed increased spontaneous activity, polyphasic motor units and diminished recruitment pattern.    Conclusion: There is severe length dependent, distal, axonal, sensorimotor polyneuropathy with acute/ongoing denervation and chronic neurogenic changes in distal and proximal lower extremity muscles.  No myopathic motor units were seen.  Recommend possible follow-up with four-limb study pending results of MRI of the pelvis(evaluate for plexopathy) and lumbar puncture.  MRI of the lumbar spine was unrevealing for etiology.  No acute denervation was seen in the lumbar paraspinal muscles.    Sarina Ill, M.D.  [facility] [place], [place] [address] Tel: [phone] Fax: [phone]        [facility]    Nerve / Sites Muscle Latency Ref. Amplitude Ref. Rel Amp Segments Distance Velocity Ref. Area    ms ms mV mV %  cm m/s m/s mVms  R Peroneal - EDB     Ankle EDB 5.1 ?6.5 0.6 ?2.0 100 Ankle - EDB 9   2.3     Fib head EDB 13.6  0.5  79.8 Fib head - Ankle 31 36 ?44 1.5     Pop  fossa EDB 16.4  0.4  89.9 Pop fossa - Fib head 10 36 ?44 1.7         Pop fossa - Ankle      L Peroneal - EDB     Ankle EDB 5.8 ?6.5 0.2 ?2.0 100 Ankle - EDB 9   0.8     Fib head EDB 15.7  0.1  54.5 Fib head - Ankle 32 32 ?44 0.4     Pop fossa EDB 18.9  0.2  208 Pop fossa - Fib head 10 32 ?44 0.9         Pop fossa - Ankle      R Tibial - AH     Ankle AH 4.3 ?5.8 2.3 ?4.0 100 Ankle - AH 9   5.0     Pop fossa AH 18.3  1.1  47.9 Pop  fossa - Ankle 44 32 ?41 3.6  L Tibial - AH     Ankle AH 5.2 ?5.8 1.1 ?4.0 100 Ankle - AH 9   4.6     Pop fossa AH 17.2  0.6  57 Pop fossa - Ankle 43 36 ?41 4.0             SNC    Nerve / Sites Rec. Site Peak Lat Ref.  Amp Ref. Segments Distance    ms ms V V  cm  R Sural - Ankle (Calf)     Calf Ankle 4.0 ?4.4 3 ?6 Calf - Ankle 14  L Sural - Ankle (Calf)     Calf Ankle 3.9 ?4.4 3 ?6 Calf - Ankle 14  R Superficial peroneal - Ankle     Lat leg Ankle 3.3 ?4.4 2 ?6 Lat leg - Ankle 14  L Superficial peroneal - Ankle     Lat leg Ankle 3.1 ?4.4 3 ?6 Lat leg - Ankle 14              F  Wave    Nerve F Lat Ref.   ms ms  R Tibial - AH 66.5 ?56.0  L Tibial - AH 68.1 ?56.0         EMG full       EMG Summary Table    Spontaneous MUAP Recruitment  Muscle IA Fib PSW Fasc Other Amp Dur. Poly Pattern  R. Iliopsoas Normal 3+ 3+ None _______ Increased Increased 2+ Reduced  R. Gluteus medius Normal None None None _______ Normal Normal Normal Normal  R. Gluteus maximus Normal None None None _______ Normal Normal Normal Normal  R. Lumbar paraspinals (low) Normal None None None _______ Normal Normal Normal Normal  R. Vastus medialis Normal None None None _______ Normal Normal Normal Normal  R. Tibialis anterior Normal None 2+ None _______ Increased Increased Normal Reduced  R. Gastrocnemius (Medial head) Normal None 1+ None _______ Increased Increased 3+ Reduced  R. Extensor hallucis longus Normal None None None _______ Normal Normal Normal Normal  R. Biceps femoris (long head) Normal None 1+ None _______ Normal Normal Normal Normal  R. Tibialis posterior Normal 1+ 1+ None _______ Increased Increased 2+ Reduced  R. Biceps femoris (short head) Normal None 1+ None _______ Normal Normal Normal Normal

## 2019-07-13 NOTE — Telephone Encounter (Signed)
Noted, thank you patient is scheduled at GI for 08/04/19.

## 2019-07-22 ENCOUNTER — Other Ambulatory Visit: Payer: Self-pay | Admitting: Neurology

## 2019-07-22 ENCOUNTER — Telehealth: Payer: Self-pay | Admitting: Neurology

## 2019-07-22 DIAGNOSIS — G629 Polyneuropathy, unspecified: Secondary | ICD-10-CM

## 2019-07-22 NOTE — Telephone Encounter (Signed)
Pt wife is asking for a call re: the status of the spinal tap being ordered

## 2019-07-22 NOTE — Telephone Encounter (Signed)
I have sent LP order to Angelita [facility]les [id number] - fax [phone] to see if she can make this happen

## 2019-07-22 NOTE — Telephone Encounter (Signed)
Alex Dunn he hs mri 9/30 can he get lp same day?

## 2019-07-28 ENCOUNTER — Other Ambulatory Visit: Payer: Self-pay

## 2019-07-28 ENCOUNTER — Ambulatory Visit (INDEPENDENT_AMBULATORY_CARE_PROVIDER_SITE_OTHER): Payer: Medicare Other | Admitting: *Deleted

## 2019-07-28 DIAGNOSIS — Z23 Encounter for immunization: Secondary | ICD-10-CM | POA: Diagnosis not present

## 2019-07-28 NOTE — Progress Notes (Signed)
Patient here for flu vaccine.  Vaccine given and patient tolerated well.

## 2019-07-29 ENCOUNTER — Other Ambulatory Visit (HOSPITAL_COMMUNITY)
Admission: RE | Admit: 2019-07-29 | Discharge: 2019-07-29 | Disposition: A | Discharge status: Home / Self Care | Payer: Medicare Other | Source: Ambulatory Visit | Attending: Neurology | Admitting: Neurology

## 2019-07-29 ENCOUNTER — Ambulatory Visit
Admission: RE | Admit: 2019-07-29 | Discharge: 2019-07-29 | Disposition: A | Discharge status: Home / Self Care | Payer: Medicare Other | Source: Ambulatory Visit | Attending: Neurology | Admitting: Neurology

## 2019-07-29 VITALS — BP 132/61 | HR 68

## 2019-07-29 DIAGNOSIS — G629 Polyneuropathy, unspecified: Secondary | ICD-10-CM | POA: Insufficient documentation

## 2019-07-29 NOTE — Discharge Instructions (Signed)

## 2019-07-30 LAB — CYTOLOGY - NON PAP

## 2019-08-03 ENCOUNTER — Telehealth: Payer: Self-pay

## 2019-08-03 NOTE — Telephone Encounter (Signed)
Copied from [place] [phone]. Topic: General - Inquiry >> Aug 03, 2019  8:47 AM Mathis Bud wrote: Reason for CRM: Patient wife [name] called stating patient had flu shot last week and felt very sleepy.  Patient also has felt like he has had a fever but not actually have one.  Dorothy wanted PCP to be aware.  Call back [phone]) [phone] (Cell)

## 2019-08-03 NOTE — Telephone Encounter (Signed)
Noted  

## 2019-08-04 ENCOUNTER — Other Ambulatory Visit: Payer: Medicare Other

## 2019-08-04 ENCOUNTER — Telehealth: Payer: Self-pay | Admitting: Neurology

## 2019-08-04 LAB — CMV (CYTOMEGALOVIRUS) DNA ULTRAQUANT, PCR
CMV DNA, QN PCR: <2.3 log IU/mL (ref <2.30)
CMV DNA, QN Real Time PCR: <200 IU/mL (ref <200)

## 2019-08-04 LAB — GLUCOSE, CSF: Glucose, CSF: 52 mg/dL (ref 40–80)

## 2019-08-04 LAB — VARICELLA ZOSTER VIRUS (VZV) DNA, QUANTITATIVE REAL-TIME PCR: VZV QN PCR: <500 copies/mL (ref <500)

## 2019-08-04 LAB — CSF CULTURE W GRAM STAIN
MICRO NUMBER:: 918270
Result:: NO GROWTH
SPECIMEN QUALITY:: ADEQUATE

## 2019-08-04 LAB — CSF CELL COUNT WITH DIFFERENTIAL
RBC Count, CSF: 1 cells/uL (ref 0–10)
WBC, CSF: 1 cells/uL (ref 0–5)

## 2019-08-04 LAB — PROTEIN, CSF: Total Protein, CSF: 48 mg/dL (ref 15–60)

## 2019-08-04 LAB — BORRELIA SPECIES DNA, FLUID, PCR: B. burgdorferi DNA: NOT DETECTED

## 2019-08-04 LAB — HERPES SIMPLEX VIRUS 1/2 (IGG), CSF
HSV 1 IgG Index:: 0.4
HSV 2 IgG Index:: <0.01

## 2019-08-04 NOTE — Telephone Encounter (Signed)
I thought I saw that his imaging was scheduled for today (MRI Pelvis) but now it doesn't have a date on it. Can you see what happened or if he has scheduled it? thanks

## 2019-08-05 NOTE — Telephone Encounter (Signed)
Patient is sick. He will call [facility] back to reschedule.

## 2019-08-11 ENCOUNTER — Other Ambulatory Visit: Payer: Self-pay

## 2019-08-11 ENCOUNTER — Ambulatory Visit
Admission: RE | Admit: 2019-08-11 | Discharge: 2019-08-11 | Disposition: A | Discharge status: Home / Self Care | Payer: Medicare Other | Source: Ambulatory Visit | Attending: Neurology | Admitting: Neurology

## 2019-08-11 DIAGNOSIS — G6181 Chronic inflammatory demyelinating polyneuritis: Secondary | ICD-10-CM

## 2019-08-11 DIAGNOSIS — M6258 Muscle wasting and atrophy, not elsewhere classified, other site: Secondary | ICD-10-CM | POA: Diagnosis not present

## 2019-08-11 DIAGNOSIS — K5730 Diverticulosis of large intestine without perforation or abscess without bleeding: Secondary | ICD-10-CM | POA: Diagnosis not present

## 2019-08-11 DIAGNOSIS — K4090 Unilateral inguinal hernia, without obstruction or gangrene, not specified as recurrent: Secondary | ICD-10-CM | POA: Diagnosis not present

## 2019-08-11 DIAGNOSIS — G629 Polyneuropathy, unspecified: Secondary | ICD-10-CM

## 2019-08-11 DIAGNOSIS — G541 Lumbosacral plexus disorders: Secondary | ICD-10-CM

## 2019-08-11 DIAGNOSIS — M79604 Pain in right leg: Secondary | ICD-10-CM

## 2019-08-11 DIAGNOSIS — N281 Cyst of kidney, acquired: Secondary | ICD-10-CM | POA: Diagnosis not present

## 2019-08-11 MED ORDER — GADOBENATE DIMEGLUMINE 529 MG/ML IV SOLN
9.0000 mL | Freq: Once | INTRAVENOUS | Status: AC | PRN
  Administered 2019-08-11: 9 mL via INTRAVENOUS

## 2019-08-15 ENCOUNTER — Telehealth: Payer: Self-pay | Admitting: Neurology

## 2019-08-15 NOTE — Telephone Encounter (Signed)
Alex Dunn: Can you call patient and get him schedule with me in the next week pr 2? If I don;t have anything let me know and I can make an appointment for him. It can be virtual, I just need to touch base and see how he is doing and discuss what we can do next.  [place], [place])

## 2019-08-16 NOTE — Telephone Encounter (Signed)
Pt returning call please call back °

## 2019-08-16 NOTE — Telephone Encounter (Signed)
I called patient and LVM regarding scheduling this appointment. Requested patient call us back.

## 2019-08-17 NOTE — Telephone Encounter (Signed)
I called patient and LVM requesting patient to call back.

## 2019-08-23 ENCOUNTER — Encounter: Payer: Self-pay | Admitting: Neurology

## 2019-08-23 ENCOUNTER — Other Ambulatory Visit: Payer: Self-pay

## 2019-08-23 ENCOUNTER — Ambulatory Visit: Payer: Medicare Other | Admitting: Neurology

## 2019-08-23 ENCOUNTER — Telehealth: Payer: Self-pay | Admitting: *Deleted

## 2019-08-23 VITALS — BP 129/67 | HR 77 | Temp 98.2°F | Ht 72.0 in | Wt 200.0 lb

## 2019-08-23 DIAGNOSIS — G629 Polyneuropathy, unspecified: Secondary | ICD-10-CM | POA: Diagnosis not present

## 2019-08-23 DIAGNOSIS — M79604 Pain in right leg: Secondary | ICD-10-CM | POA: Diagnosis not present

## 2019-08-23 DIAGNOSIS — M79605 Pain in left leg: Secondary | ICD-10-CM | POA: Diagnosis not present

## 2019-08-23 NOTE — Progress Notes (Signed)
[facility]    Provider:  Dr Jaynee Eagles Referring Provider: Cy Blamer MD Primary Care Physician:  Shelda Pal, DO  CC: Leg pain for a year  Interval history 08/23/2019: We reviewed below symptoms, he endorses all these symptoms. Extensive workup including Lumbar Puncture, MRI Lumbar spine, MRI pelvis to evaluate for lumbosacral plexopathy,extensive lab testing without etiology. Will send [facility] genetic panel with amyloid testing and refer to Dr. Vallarie Mare at [facility]. Discussed with hematology and his MGUS is stable, recent biopsy of bine marrow was completed.  EMG/NCS showed progressive distal severe axonal polyneuropathy. He still has pain in the back of the thigh and in the hips and calf muscle and a lot of pain in the calf and lateral lower leg and top of the foot. Only hurts when putting pressure on the leg. Not when laying down or sitting down it goes away. He thinks the pain in the back of the leg is not the same as the lower leg pain. Legs are getting weaker and weaker. Arms are not involved all below the waist, He describes the pain as burning and his ankles hurt if he stands very long on them. R>L and he does not think it is coming from the low back. Worse is first thing in the morning when walking. Tramadol helps significantly. Severe, same symptoms on the left but not as bad the left leg hurts only from the knee down. No pain n palpation of bones or fibular head. Strength 5/5.   I contacted and discussed with his oncologist but recent bone surveys and bone marrow biopsies unremarkable no suggestion his MGUS or any blood dyscrasia involved.   Interval history 05/06/2019: This is a lovely patient who I have seen in 2019 for significant fatigue and weakness.  Extensive evaluation did not show any primary neurologic causes for his symptoms.  He is here for a new issue however.  He recently has had new low back pain with radicular symptoms.  He is here with his wife  who also provides much information.  He has low back pain, pain all the way down the legs to the fee and ankles. The right leg is worse. He had a relapse of fatigue and that was on April 24th and it started about 4 months, worsened since then. He saw his heatologist, thought it was the MGUS. No incited events, did not fall. He has balance problems.  He had an MRI of the lumbar spine and he has worsened. He had an MRi of the lumbar spine and they did not get the answers. He got an injection to see if it helps. More weakness in the legs.  We will have to get a CD of the lumbar spine.  The night before the shot he bent over and when he raised up he had worse pain, he has more weakness. He had severe pain since the MRI. The pain actually improved and then worsened after the shot with Dr. Nelva Bush. He has difficulty walking a certain amount of time, he has to sit down to recuperate and walk more.better sitting and laying, worse walking.   Interval history: Reviewed extensive workup with patient inclusing emg/ncs, labwork and imaging. Labs included B12 (242), heavy metals, B6, B1, MMA, lyme, MMA showed a monoclonal IgA protein and he is following with hematology. Reviewed MRi of the brain images with patient and wife, MRi brain unremarkable, MRi cervical spine with multilevel degenerative changes but normal cervical cord which rules out  etiology in the lower extremities where he reports weakness as well. EMG/NCS showed polyneuropathy but no myopathy/myositis or myasthenia gravis. MG antibodies also negative. He is feeling better, discussed no neurologic cause for his symptoms and to continue to follow with hematology and rheumatology.  This MRI of the brain with and without contrast shows the following: 1.     Mild generalized cortical atrophy. 2.     T2/FLAIR hyperintense foci of the hemispheres consistent with mild chronic microvascular ischemic changes 3.     Vertebrobasilar and internal carotid artery  dolichoectasia. 4.     There are no acute findings and there is a normal enhancement pattern  IMPRESSION: This MRI of the cervical spine without contrast shows the following: 1.     The spinal cord appears normal. 2.     At C3-C4, there is borderline spinal stenosis due to degenerative changes also causing mild to moderate foraminal narrowing.  There is no nerve root compression. 3.     At C4-C5, there is mild spinal stenosis due to degenerative changes also causing mild to moderate foraminal narrowing.  There is no nerve root compression. 4.     At C7-T1, there is minimal anterolisthesis, facet hypertrophy and disc bulging.  There is no spinal stenosis or nerve root compression. 5.     There are milder degenerative changes at the other cervical levels as detailed above that do not lead to spinal stenosis or nerve root compression. 6.     There are endplate deformations involving the C3-C6 vertebral bodies most consistent with chronic osteoporotic endplate fractures. 7.     There are no acute findings.   HPI:  Alex Dunn is a 83 y.o. male here as requested by Dr. Estanislado Pandy for chronic fatigue, muscle weakness.PMHx HTN, Polycystic kidney disease, fatigue, night sweats, anemia, gerd, BPH. Started in March 2019 with flu-like episode. Slowly progressive, getting more frequent and prolonged, fatigue daily generally tired all over, no headaches, no neck pain, no vision changes, no falls, no gait changes, no focal weakness, he feels generally fatigued worse after a morning bowel movement. Feels better with steroids. No ptosis, no facial weakness, no diplopia, no swallowing difficulties. He is not sleeping well because he gets up to urinate. Night sweats improved as is the fatigue on steroids. Flu-like symptoms resolved. No change in medication, no head trauma, no FHx of neurologic problems, Worse in the mornings when waking up at 7am feel better later in the day, he doesn't feel like he sleeps. They deny  snoring or other sleep apnea symptoms. No neck pain or back pain. No radicular symptoms. No tremors currently. No dysphagia. He has difficulty walking when fatigued and cant walk, he feels imbalanced. He is currently in PT.   Reviewed notes, labs and imaging from outside physicians, which showed:  Reviewed Dr. Estanislado Pandy notes. March 2019 woke up with sweats and flu-like episodes, lasted 5-6 and now having recurrent episodes and becoming longer and more frequent. Lasting 2 weeks of fatigue and being tired. Has been evaluated by pcp, cardiology and rheumatology, cardiology workup negative, also infectious disease workup and has seen a kidney specialist. Also with arthralgias. He has gained weight.   Extensive workup reviewed: 04/15/2018: CK 40, hep C negative, HIV negative, RPR negative, CRP 1.520, T4 0.89 04/06/2018 RNP negative, Ro-, La -, Scl-70 -, Smith negative, dsDNA 32, anti-Jo 1 negative, burgdorferi antibody negative  05/06/18: EBV IgM negative, Hgb A1c 5.6, Hep B -, ACTH 13, Cortisol 8.7, RF <14,  CH50 >60, ANA 1:80 Homogenous May 2019 ESR 6, PPD negative March 2019 vitamin D 16.71  All negative except ANA and DSDNA+, Positive ANA (antinuclear antibody) - ANA 1:80 Homogenous, RF -, Ro-, La-, smith -, RNP-, CH50 >60, CK 40, dsDNA 32, Anti-Jo 1 negative, Scl-70 negative  TSH nml  Review of Systems: Patient complains of symptoms per HPI as well as the following symptoms: insomnia, sleepiness, weakness, decreased energy, leg pain worsening, worsening fatigue. Pertinent negatives and positives per HPI. All others negative.   Social History   Socioeconomic History   Marital status: Married    Spouse name: Not on file   Number of children: 1   Years of education: Not on file   Highest education level: High school graduate  Occupational History   Not on file  Social Needs   Financial resource strain: Not on file   Food insecurity    Worry: Not on file    Inability: Not on file    Transportation needs    Medical: Not on file    Non-medical: Not on file  Tobacco Use   Smoking status: Never Smoker   Smokeless tobacco: Never Used  Substance and Sexual Activity   Alcohol use: Never    Frequency: Never   Drug use: Never   Sexual activity: Not on file  Lifestyle   Physical activity    Days per week: Not on file    Minutes per session: Not on file   Stress: Not on file  Relationships   Social connections    Talks on phone: Not on file    Gets together: Not on file    Attends religious service: Not on file    Active member of club or organization: Not on file    Attends meetings of clubs or organizations: Not on file    Relationship status: Not on file   Intimate partner violence    Fear of current or ex partner: Not on file    Emotionally abused: Not on file    Physically abused: Not on file    Forced sexual activity: Not on file  Other Topics Concern   Not on file  Social History Narrative   Lives at home with his wife   Retired   Right handed   Caffeine: about 2 cups daily    Family History  Problem Relation Age of Onset   Heart attack Mother    Heart attack Father    Heart attack Brother        "leaky valve"   Neuromuscular disorder Neg Hx     Past Medical History:  Diagnosis Date   ANEMIA-IRON DEFICIENCY 06/23/2007   BENIGN PROSTATIC HYPERTROPHY 06/23/2007   Diverticulosis    GERD 06/23/2007   Hypertension    Polycystic kidney disease    stage 3 per wife    Past Surgical History:  Procedure Laterality Date   HAND SURGERY     LUNG SURGERY     for collapsed lung    Current Outpatient Medications  Medication Sig Dispense Refill   Acetaminophen (TYLENOL PO) Take by mouth as needed.     losartan (COZAAR) 100 MG tablet Take 1 tablet by mouth once daily 90 tablet 1   Multiple Vitamins-Minerals (ICAPS AREDS 2 PO) Take 1 each by mouth daily.     pantoprazole (PROTONIX) 40 MG tablet Take 1 tablet (40 mg total) by  mouth daily as needed. (Patient taking differently: Take 40 mg by mouth daily as needed (  heartburn). ) 90 tablet 3   traMADol (ULTRAM) 50 MG tablet Take 1 tablet (50 mg total) by mouth as needed. 30 tablet 2   VITAMIN D PO Take 2,000 Int'l Units by mouth daily.     lidocaine (LIDODERM) 5 % Place 2 patches onto the skin daily. Remove & Discard patch within 12 hours or as directed by MD. Use on the low back. (Patient not taking: Reported on 08/23/2019) 60 patch 6   No current facility-administered medications for this visit.     Allergies as of 08/23/2019 - Review Complete 08/23/2019  Allergen Reaction Noted   Codeine phosphate Other (See Comments)    Lexapro [escitalopram oxalate] Other (See Comments) 05/03/2018    Vitals: BP 129/67 (BP Location: Right Arm, Patient Position: Sitting)    Pulse 77    Temp 98.2 F (36.8 C) Comment: wife 97.9, both taken at front door   Ht 6' (1.829 m)    Wt 200 lb (90.7 kg)    BMI 27.12 kg/m  Last Weight:  Wt Readings from Last 1 Encounters:  08/23/19 200 lb (90.7 kg)   Last Height:   Ht Readings from Last 1 Encounters:  08/23/19 6' (1.829 m)    Physical exam: Exam: Gen: NAD, conversant                    CV: RRR, no MRG. No Carotid Bruits. No peripheral edema, warm, nontender Eyes: Conjunctivae clear without exudates or hemorrhage Skin: His lower legs appear ruddy distally, worse with dependency, decreased hair growth, no sores or rashes per se but he does have discoloration in his lower legs and around the ankles of a reddish hue.  Neuro: Detailed Neurologic Exam  Speech:    Speech is normal; fluent and spontaneous with normal comprehension.  Cognition:    The patient is oriented to person, place, and time;     recent and remote memory intact;     language fluent;     normal attention, concentration,     fund of knowledge Cranial Nerves:    The pupils are equal, round, and reactive to light. Attempted fundoscopic exam could not  visualize. . Visual fields are full to finger confrontation. Extraocular movements are intact. Trigeminal sensation is intact and the muscles of mastication are normal. The face is symmetric. The palate elevates in the midline. Hearing intact. Voice is normal. Shoulder shrug is normal. The tongue has normal motion without fasciculations.   Coordination:    Normal finger to nose   Gait:    Cannot get up without using his hands. slightly antalgi and cautious.    Motor Observation:    No asymmetry, no atrophy, and mild postural and action tremor Tone:    Normal muscle tone.    Posture:    Posture is slightly stooped    Strength:    Strength is V/V in the upper and lower limbs.      Sensation: intact to LT     Reflex Exam:  DTR's:    Deep tendon reflexes in the lowers are trace on the left and 1+ on the right.  Toes:    The toes are equivocal bilaterally.   Clonus:    Clonus is absent.      Assessment/Plan:   83 y.o. male here as requested by Dr. Estanislado Pandy for chronic fatigue, muscle weakness.PMHx HTN, Polycystic kidney disease,MGUS, fatigue, night sweats, anemia, gerd, BPH. Started in March 2019 with flu-like episode. Slowly progressive, getting more frequent  and prolonged, fatigue daily generally tired all over, he has brisk reflexes for age, imbalance.  Extensive labs, MRIs of the brain and cervical spine, lumbar spine, MRI Pelvis, LP, extensive labs without etiology. Worsening leg pain >> weakness.   - vascular studies were normal, no vascular causes for leg pain -MRI of the lumbar spine does not explain his worsening the leg pain,it was largely unremarkable. neither does MRI of the pelvis evaluating the lumbosacral plexus. -CSF unremarkable including varicella, CMV, Borrelia, HSV, CSF culture, protein, glucose, cell count with differential, cytology, in addition to labs performed last year (see below) also cryoglobulin, mag IgM antibodies, CK, B12, T4, lipids, CMP, CBC all  unremarkable.  - Will send [facility] genetic panel with amyloid testing and refer to Dr. Vallarie Mare at [facility].  - Discussed with hematology and his MGUS is stable, recent biopsy of bine marrow was completed.  EMG/NCS showed progressive distal severe axonal polyneuropathy. - Consider Sural nerve biopsy  Orders Placed This Encounter  Procedures   Ambulatory referral to Neurology     Prior evaluation:  extensive workup including emg/ncs, labwork and imaging was completed last year. Labs included B12 (242), heavy metals, B6, B1, MMA, lyme, MMA showed a monoclonal IgA protein and he is following with hematology. Reviewed MRi of the brain images with patient and wife, MRi brain unremarkable, MRi cervical spine with multilevel degenerative changes but normal cervical cord which rules out etiology in the lower extremities where he reports weakness as well. EMG/NCS showed polyneuropathy but no myopathy/myositis or myasthenia gravis. MG antibodies also negative.  We again discussed no neurologic cause for his symptoms were found in the past and to continue to follow with hematology and rheumatology.  This MRI of the brain with and without contrast shows the following: 1.     Mild generalized cortical atrophy. 2.     T2/FLAIR hyperintense foci of the hemispheres consistent with mild chronic microvascular ischemic changes 3.     Vertebrobasilar and internal carotid artery dolichoectasia. 4.     There are no acute findings and there is a normal enhancement pattern  IMPRESSION: This MRI of the cervical spine without contrast shows the following: 1.     The spinal cord appears normal. 2.     At C3-C4, there is borderline spinal stenosis due to degenerative changes also causing mild to moderate foraminal narrowing.  There is no nerve root compression. 3.     At C4-C5, there is mild spinal stenosis due to degenerative changes also causing mild to moderate foraminal narrowing.  There is no nerve root  compression. 4.     At C7-T1, there is minimal anterolisthesis, facet hypertrophy and disc bulging.  There is no spinal stenosis or nerve root compression. 5.     There are milder degenerative changes at the other cervical levels as detailed above that do not lead to spinal stenosis or nerve root compression. 6.     There are endplate deformations involving the C3-C6 vertebral bodies most consistent with chronic osteoporotic endplate fractures. 7.     There are no acute findings.   Sarina Ill, MD  [facility] [address] [place], [place] [address]  Phone [phone] Fax [phone]  A total of 40 minutes was spent face-to-face with this patient. Over half this time was spent on counseling patient on the  1. Pain in both lower extremities   2. Polyneuropathy    diagnosis and different diagnostic and therapeutic options, counseling and coordination of  care, risks ans benefits of management, compliance, or risk factor reduction and education.

## 2019-08-23 NOTE — Telephone Encounter (Signed)
[facility] genetic testing referral form completed and signed by Dr. Jaynee Eagles. Faxed to [facility]. Asked for [facility] to mail collection kit to pt. Received a receipt of confirmation.

## 2019-09-07 ENCOUNTER — Other Ambulatory Visit: Payer: Self-pay

## 2019-09-07 ENCOUNTER — Inpatient Hospital Stay (HOSPITAL_BASED_OUTPATIENT_CLINIC_OR_DEPARTMENT_OTHER): Payer: Medicare Other | Admitting: Hematology

## 2019-09-07 ENCOUNTER — Encounter: Payer: Self-pay | Admitting: Hematology

## 2019-09-07 ENCOUNTER — Inpatient Hospital Stay: Payer: Medicare Other | Attending: Hematology

## 2019-09-07 VITALS — BP 146/77 | HR 76 | Temp 97.8°F | Resp 17 | Wt 200.0 lb

## 2019-09-07 DIAGNOSIS — M25552 Pain in left hip: Secondary | ICD-10-CM | POA: Insufficient documentation

## 2019-09-07 DIAGNOSIS — M25551 Pain in right hip: Secondary | ICD-10-CM | POA: Insufficient documentation

## 2019-09-07 DIAGNOSIS — Q613 Polycystic kidney, unspecified: Secondary | ICD-10-CM | POA: Insufficient documentation

## 2019-09-07 DIAGNOSIS — M791 Myalgia, unspecified site: Secondary | ICD-10-CM | POA: Diagnosis not present

## 2019-09-07 DIAGNOSIS — M2550 Pain in unspecified joint: Secondary | ICD-10-CM | POA: Diagnosis not present

## 2019-09-07 DIAGNOSIS — N183 Chronic kidney disease, stage 3 unspecified: Secondary | ICD-10-CM | POA: Insufficient documentation

## 2019-09-07 DIAGNOSIS — D472 Monoclonal gammopathy: Secondary | ICD-10-CM

## 2019-09-07 LAB — CBC WITH DIFFERENTIAL (CANCER CENTER ONLY)
Abs Immature Granulocytes: 0.02 10*3/uL (ref 0.00–0.07)
Basophils Absolute: 0 10*3/uL (ref 0.0–0.1)
Basophils Relative: 1 %
Eosinophils Absolute: 0.1 10*3/uL (ref 0.0–0.5)
Eosinophils Relative: 2 %
HCT: 43.8 % (ref 39.0–52.0)
Hemoglobin: 14 g/dL (ref 13.0–17.0)
Immature Granulocytes: 0 %
Lymphocytes Relative: 24 %
Lymphs Abs: 1.5 10*3/uL (ref 0.7–4.0)
MCH: 31.3 pg (ref 26.0–34.0)
MCHC: 32 g/dL (ref 30.0–36.0)
MCV: 97.8 fL (ref 80.0–100.0)
Monocytes Absolute: 0.5 10*3/uL (ref 0.1–1.0)
Monocytes Relative: 8 %
Neutro Abs: 4.3 10*3/uL (ref 1.7–7.7)
Neutrophils Relative %: 65 %
Platelet Count: 192 10*3/uL (ref 150–400)
RBC: 4.48 MIL/uL (ref 4.22–5.81)
RDW: 12.8 % (ref 11.5–15.5)
WBC Count: 6.5 10*3/uL (ref 4.0–10.5)
nRBC: 0 % (ref 0.0–0.2)

## 2019-09-07 LAB — CMP (CANCER CENTER ONLY)
ALT: 10 U/L (ref 0–44)
AST: 13 U/L — ABNORMAL LOW (ref 15–41)
Albumin: 4 g/dL (ref 3.5–5.0)
Alkaline Phosphatase: 83 U/L (ref 38–126)
Anion gap: 5 (ref 5–15)
BUN: 20 mg/dL (ref 8–23)
CO2: 29 mmol/L (ref 22–32)
Calcium: 9.3 mg/dL (ref 8.9–10.3)
Chloride: 107 mmol/L (ref 98–111)
Creatinine: 1.73 mg/dL — ABNORMAL HIGH (ref 0.61–1.24)
GFR, Est AFR Am: 42 mL/min — ABNORMAL LOW (ref >60)
GFR, Estimated: 36 mL/min — ABNORMAL LOW (ref >60)
Glucose, Bld: 91 mg/dL (ref 70–99)
Potassium: 4.4 mmol/L (ref 3.5–5.1)
Sodium: 141 mmol/L (ref 135–145)
Total Bilirubin: 0.5 mg/dL (ref 0.3–1.2)
Total Protein: 6.8 g/dL (ref 6.5–8.1)

## 2019-09-07 LAB — LACTATE DEHYDROGENASE: LDH: 135 U/L (ref 98–192)

## 2019-09-07 NOTE — Progress Notes (Signed)
[place] OFFICE PROGRESS NOTE  Patient Care Team: Shelda Pal, DO as PCP - General (Family Medicine) Melvenia Beam, MD as Consulting Physician (Neurology) Pedro Earls, MD as Attending Physician (Family Medicine)  HEME/ONC OVERVIEW: 1. IgA kappa MGUS -Previous patient of Dr. Audelia Hives  -08/2018: baseline parameters  Normal Hgb, CKD (Cr 1.4-1.5)  M-spike 0.3 g/dL, free kappa 49, quant IgA ~560  Bone marrow bx: 8% plasma cells on bone marrow biopsy, Congo red stain negative; karyotype 46,XY, no cytogenetic abnormalities  Negative skeletal survey  -On observation   TREATMENT REGIMEN:  Surveillance   PERTINENT NON-HEM/ONC PROBLEMS: 1. Stage III CKD (Cr 1.4-1.5), due to polycystic kidney disease   ASSESSMENT & PLAN:   IgA kappa MGUS -MM labs pending today -CBC and Cr stable; low suspicion for MGUS progression  -No indication for further work-up at this time -We will continue to monitor his MM labs q18month  Generalized arthralgia -Etiology unclear; patient has seen a number of specialists and undergone extensive work-up without a clear cause -[facility] -I discussed with the patient an uncommon variant of plasma cell dyscrasia (ie amyloidosis) can sometimes cause nerve involvement, but his Congo red stain from bone marrow bx was negative  -I will defer further management to neurology   Stage III CKD -Secondary to polycystic kidney disease  -Baseline Cr 1.5-1.6 -Cr 1.7 today, stable; electrolytes normal -I encouraged patient to follow up with PCP for further management  Orders Placed This Encounter  Procedures  . CBC with Differential (Cancer Center Only)    Standing Status:   Future    Standing Expiration Date:   10/11/2020  . [place]    Standing Status:   Future    Standing Expiration Date:   10/11/2020  . Multiple Myeloma Panel (SPEP&IFE w/QIG)     Standing Status:   Future    Standing Expiration Date:   10/11/2020  . Kappa/lambda light chains    Standing Status:   Future    Standing Expiration Date:   03/06/2021  . Lactate dehydrogenase    Standing Status:   Future    Standing Expiration Date:   10/11/2020    All questions were answered. The patient knows to call the clinic with any problems, questions or concerns. No barriers to learning was detected.  Return in 6 months for labs and clinic follow-up.  YTish Men MD 09/07/2019 9:57 AM  CHIEF COMPLAINT: "My legs still really bother me"  INTERVAL HISTORY: Mr. SBruscareturns to clinic for follow-up of IgA kappa MGUS.  Patient reports that he continues to have chronic bilateral hip pain radiating to the feet, exacerbated by activity, worse at night when he wakes up to use the bathroom, for which he has tried a variety of treatment, including physical therapy and steroid injection to the back, without any improvement.  He currently takes tramadol 50 mg once a day with significant improvement in his symptoms.  He has seen a number of specialists, including orthopedic surgery and neurology, but unfortunately there is still no clear explanation for his symptoms.  [facility]  He denies any other new complaint today.  REVIEW OF SYSTEMS:   Constitutional: ( - ) fevers, ( - )  chills , ( - ) night sweats Eyes: ( - ) blurriness of vision, ( - )  double vision, ( - ) watery eyes Ears, nose, mouth, throat, and face: ( - ) mucositis, ( - ) sore throat Respiratory: ( - ) cough, ( - ) dyspnea, ( - ) wheezes Cardiovascular: ( - ) palpitation, ( - ) chest discomfort, ( - ) lower extremity swelling Gastrointestinal:  ( - ) nausea, ( - ) heartburn, ( - ) change in bowel habits Skin: ( - ) abnormal skin rashes Lymphatics: ( - ) new lymphadenopathy, ( - ) easy bruising Behavioral/Psych: ( - ) mood change, ( - ) new changes  All  other systems were reviewed with the patient and are negative.  SUMMARY OF ONCOLOGIC HISTORY: Oncology History   No history exists.    I have reviewed the past medical history, past surgical history, social history and family history with the patient and they are unchanged from previous note.  ALLERGIES:  is allergic to codeine phosphate and lexapro [escitalopram oxalate].  MEDICATIONS:  Current Outpatient Medications  Medication Sig Dispense Refill  . Acetaminophen (TYLENOL PO) Take by mouth as needed.    Marland Kitchen losartan (COZAAR) 100 MG tablet Take 1 tablet by mouth once daily 90 tablet 1  . Multiple Vitamins-Minerals (ICAPS AREDS 2 PO) Take 1 each by mouth daily.    . pantoprazole (PROTONIX) 40 MG tablet Take 1 tablet (40 mg total) by mouth daily as needed. (Patient taking differently: Take 40 mg by mouth daily as needed (heartburn). ) 90 tablet 3  . traMADol (ULTRAM) 50 MG tablet Take 1 tablet (50 mg total) by mouth as needed. 30 tablet 2  . VITAMIN D PO Take 2,000 Int'l Units by mouth daily.     No current facility-administered medications for this visit.     PHYSICAL EXAMINATION: ECOG PERFORMANCE STATUS: 2 - Symptomatic, <50% confined to bed  Today's Vitals   09/07/19 0932  BP: (!) 146/77  Pulse: 76  Resp: 17  Temp: 97.8 F (36.6 C)  TempSrc: Temporal  SpO2: 100%  Weight: 200 lb (90.7 kg)  PainSc: 2    Body mass index is 27.12 kg/m.  Filed Weights   09/07/19 0932  Weight: 200 lb (90.7 kg)    GENERAL: alert, no distress and comfortable SKIN: skin color, texture, turgor are normal, no rashes or significant lesions EYES: conjunctiva are pink and non-injected, sclera clear OROPHARYNX: no exudate, no erythema; lips, buccal mucosa, and tongue normal  NECK: supple, non-tender LYMPH:  no palpable lymphadenopathy in the cervical LUNGS: clear to auscultation with normal breathing effort HEART: regular rate & rhythm and no murmurs and no lower extremity edema ABDOMEN: soft,  non-tender, non-distended, normal bowel sounds Musculoskeletal: no cyanosis of digits and no clubbing  PSYCH: alert & oriented x 3, fluent speech  LABORATORY DATA:  I have reviewed the data as listed    Component Value Date/Time   NA 141 09/07/2019 0915   NA 142 07/01/2019 1154   K 4.4 09/07/2019 0915   CL 107 09/07/2019 0915   CO2 29 09/07/2019 0915   GLUCOSE 91 09/07/2019 0915   BUN 20 09/07/2019 0915   BUN 25 07/01/2019 1154   CREATININE 1.73 (H) 09/07/2019 0915   CREATININE 1.61 (H) 02/26/2019 1511   CALCIUM 9.3 09/07/2019 0915   PROT 6.8 09/07/2019 0915   PROT 6.6 07/20/2018 0944   ALBUMIN 4.0 09/07/2019 0915   AST 13 (L) 09/07/2019 0915   ALT 10 09/07/2019 0915   ALKPHOS 83 09/07/2019 0915   BILITOT 0.5 09/07/2019 0915   GFRNONAA  36 (L) 09/07/2019 0915   GFRAA 42 (L) 09/07/2019 0915    No results found for: SPEP, UPEP  Lab Results  Component Value Date   WBC 6.5 09/07/2019   NEUTROABS 4.3 09/07/2019   HGB 14.0 09/07/2019   HCT 43.8 09/07/2019   MCV 97.8 09/07/2019   PLT 192 09/07/2019      Chemistry      Component Value Date/Time   NA 141 09/07/2019 0915   NA 142 07/01/2019 1154   K 4.4 09/07/2019 0915   CL 107 09/07/2019 0915   CO2 29 09/07/2019 0915   BUN 20 09/07/2019 0915   BUN 25 07/01/2019 1154   CREATININE 1.73 (H) 09/07/2019 0915   CREATININE 1.61 (H) 02/26/2019 1511      Component Value Date/Time   CALCIUM 9.3 09/07/2019 0915   ALKPHOS 83 09/07/2019 0915   AST 13 (L) 09/07/2019 0915   ALT 10 09/07/2019 0915   BILITOT 0.5 09/07/2019 0915       RADIOGRAPHIC STUDIES: I have personally reviewed the radiological images as listed below and agreed with the findings in the report. Mr Pelvis W Wo Contrast  Result Date: 08/13/2019 CLINICAL DATA:  Chronic bilateral buttock pain radiating into both hips and legs, worse on the right. Right leg weakness and numbness. Concern for lumbosacral plexopathy. EXAM: MRI PELVIS WITHOUT AND WITH CONTRAST  TECHNIQUE: Multiplanar multisequence MR imaging of the pelvis was performed both before and after administration of intravenous contrast. CONTRAST:  38m MULTIHANCE GADOBENATE DIMEGLUMINE 529 MG/ML IV SOLN COMPARISON:  CT abdomen pelvis dated May 03, 2018. FINDINGS: Musculoskeletal: Normal appearance of the lumbosacral plexus. No enlarged nerve roots or nerve root enhancement. The visualized sciatic nerves are unremarkable. No muscle edema. Bilateral gluteus minimus muscle atrophy. No acute or significant osseous findings. Urinary Tract:  No abnormality visualized.  Bilateral renal cysts. Bowel: Extensive sigmoid diverticulosis. Otherwise unremarkable visualized bowel loops. Normal appendix. Vascular/Lymphatic: No pathologically enlarged lymph nodes. No significant vascular abnormality seen. Reproductive:  Borderline prostatomegaly. Other: No free fluid in the pelvis. Small fat containing left inguinal hernia. IMPRESSION: 1. Normal appearing lumbosacral plexus. Electronically Signed   By: WTitus DubinM.D.   On: 08/13/2019 19:54

## 2019-09-08 LAB — MULTIPLE MYELOMA PANEL, SERUM
Albumin SerPl Elph-Mcnc: 3.6 g/dL (ref 2.9–4.4)
Albumin/Glob SerPl: 1.2 (ref 0.7–1.7)
Alpha 1: 0.2 g/dL (ref 0.0–0.4)
Alpha2 Glob SerPl Elph-Mcnc: 0.8 g/dL (ref 0.4–1.0)
B-Globulin SerPl Elph-Mcnc: 1.5 g/dL — ABNORMAL HIGH (ref 0.7–1.3)
Gamma Glob SerPl Elph-Mcnc: 0.7 g/dL (ref 0.4–1.8)
Globulin, Total: 3.2 g/dL (ref 2.2–3.9)
IgA: 570 mg/dL — ABNORMAL HIGH (ref 61–437)
IgG (Immunoglobin G), Serum: 981 mg/dL (ref 603–1613)
IgM (Immunoglobulin M), Srm: 50 mg/dL (ref 15–143)
M Protein SerPl Elph-Mcnc: 0.2 g/dL — ABNORMAL HIGH
Total Protein ELP: 6.8 g/dL (ref 6.0–8.5)

## 2019-09-08 LAB — KAPPA/LAMBDA LIGHT CHAINS
Kappa free light chain: 65 mg/L — ABNORMAL HIGH (ref 3.3–19.4)
Kappa, lambda light chain ratio: 2.75 — ABNORMAL HIGH (ref 0.26–1.65)
Lambda free light chains: 23.6 mg/L (ref 5.7–26.3)

## 2019-09-21 ENCOUNTER — Other Ambulatory Visit: Payer: Self-pay | Admitting: Family Medicine

## 2019-09-21 NOTE — Telephone Encounter (Signed)
Last OV---04/21/2019 No upcoming appt. Scheduled Last RF---#30 with 2  On 05/2019

## 2019-11-03 ENCOUNTER — Encounter: Payer: Self-pay | Admitting: Cardiology

## 2019-11-03 ENCOUNTER — Other Ambulatory Visit: Payer: Self-pay

## 2019-11-03 ENCOUNTER — Ambulatory Visit (INDEPENDENT_AMBULATORY_CARE_PROVIDER_SITE_OTHER): Payer: Medicare Other | Admitting: Cardiology

## 2019-11-03 VITALS — BP 126/60 | HR 70 | Ht 72.0 in | Wt 201.8 lb

## 2019-11-03 DIAGNOSIS — R5382 Chronic fatigue, unspecified: Secondary | ICD-10-CM

## 2019-11-03 DIAGNOSIS — D472 Monoclonal gammopathy: Secondary | ICD-10-CM | POA: Diagnosis not present

## 2019-11-03 DIAGNOSIS — I472 Ventricular tachycardia: Secondary | ICD-10-CM

## 2019-11-03 DIAGNOSIS — I10 Essential (primary) hypertension: Secondary | ICD-10-CM

## 2019-11-03 DIAGNOSIS — I4729 Other ventricular tachycardia: Secondary | ICD-10-CM

## 2019-11-03 DIAGNOSIS — I700 Atherosclerosis of aorta: Secondary | ICD-10-CM | POA: Diagnosis not present

## 2019-11-03 HISTORY — DX: Ventricular tachycardia: I47.2

## 2019-11-03 HISTORY — DX: Other ventricular tachycardia: I47.29

## 2019-11-03 NOTE — Patient Instructions (Addendum)
Medication Instructions:  Your physician recommends that you continue on your current medications as directed. Please refer to the Current Medication list given to you today.  *If you need a refill on your cardiac medications before your next appointment, please call your pharmacy*  Lab Work: None ordered  If you have labs (blood work) drawn today and your tests are completely normal, you will receive your results only by: Marland Kitchen MyChart Message (if you have MyChart) OR . A paper copy in the mail If you have any lab test that is abnormal or we need to change your treatment, we will call you to review the results.  Testing/Procedures: Your physician has requested that you have an echocardiogram. Echocardiography is a painless test that uses sound waves to create images of your heart. It provides your doctor with information about the size and shape of your heart and how well your heart's chambers and valves are working. This procedure takes approximately one hour. There are no restrictions for this procedure.   Follow-Up: At [facility], you and your health needs are our priority.  As part of our continuing mission to provide you with exceptional heart care, we have created designated Provider Care Teams.  These Care Teams include your primary Cardiologist (physician) and Advanced Practice Providers (APPs -  Physician Assistants and Nurse Practitioners) who all work together to provide you with the care you need, when you need it.  Your next appointment:   6 month(s)  The format for your next appointment:   In Person  Provider:   Jenne Campus, MD  Other Instructions

## 2019-11-03 NOTE — Progress Notes (Signed)
Cardiology Office Note:    Date:  11/03/2019   ID:  Alex Dunn, DOB 1936/05/17, MRN [id number]  PCP:  Shelda Pal, DO  Cardiologist:  Jenne Campus, MD    Referring MD: Shelda Pal*   No chief complaint on file. Still weak and tired  History of Present Illness:    Alex Dunn is a 83 y.o. male he was referred to [place] originally because of profound weakness fatigue and tiredness.  Quite extensive correlation has been performed from cardiac standpoint review which included stress test as well as echocardiogram both were negative.  We did discover however that he does have no sustained ventricular tachycardia.  He does not have any syncope no passing out today he comes today to my office for follow-up overall he is doing the same meaning still weak tired exhausted no dizziness no passing out the leading complaint appears to be pain in his legs.  He does have quite extensive evaluation has been done and this evaluation is still in progress.  He see neurology.  In my opinion he may also benefit rheumatologist or may be empiric treatment for polymyalgia rheumatica.  But I would leave it up to discretion of his primary care physician.  From cardiac standpoint review I will ask him to have repeated echocardiogram to check left ventricle ejection fraction.  I anticipate this to be normal.  And then if that is fine I will probably see him on a yearly basis.  Past Medical History:  Diagnosis Date  . ANEMIA-IRON DEFICIENCY 06/23/2007  . BENIGN PROSTATIC HYPERTROPHY 06/23/2007  . Diverticulosis   . GERD 06/23/2007  . Hypertension   . Polycystic kidney disease    stage 3 per wife    Past Surgical History:  Procedure Laterality Date  . HAND SURGERY    . LUNG SURGERY     for collapsed lung    Current Medications: Current Meds  Medication Sig  . Acetaminophen (TYLENOL PO) Take by mouth as needed.  Marland Kitchen losartan (COZAAR) 100 MG tablet Take 1 tablet by mouth once  daily  . Multiple Vitamins-Minerals (ICAPS AREDS 2 PO) Take 1 each by mouth daily.  . pantoprazole (PROTONIX) 40 MG tablet Take 1 tablet (40 mg total) by mouth daily as needed. (Patient taking differently: Take 40 mg by mouth daily as needed (heartburn). )  . traMADol (ULTRAM) 50 MG tablet TAKE 1 TABLET BY MOUTH AS NEEDED  . VITAMIN D PO Take 2,000 Int'l Units by mouth daily.     Allergies:   Codeine phosphate and Lexapro [escitalopram oxalate]   Social History   Socioeconomic History  . Marital status: Married    Spouse name: Not on file  . Number of children: 1  . Years of education: Not on file  . Highest education level: High school graduate  Occupational History  . Not on file  Tobacco Use  . Smoking status: Never Smoker  . Smokeless tobacco: Never Used  Substance and Sexual Activity  . Alcohol use: Never  . Drug use: Never  . Sexual activity: Not on file  Other Topics Concern  . Not on file  Social History Narrative   Lives at home with his wife   Retired   Right handed   Caffeine: about 2 cups daily   Social Determinants of Health   Financial Resource Strain:   . Difficulty of Paying Living Expenses: Not on file  Food Insecurity:   . Worried About Charity fundraiser  in the Last Year: Not on file  . Ran Out of Food in the Last Year: Not on file  Transportation Needs:   . Lack of Transportation (Medical): Not on file  . Lack of Transportation (Non-Medical): Not on file  Physical Activity:   . Days of Exercise per Week: Not on file  . Minutes of Exercise per Session: Not on file  Stress:   . Feeling of Stress : Not on file  Social Connections:   . Frequency of Communication with Friends and Family: Not on file  . Frequency of Social Gatherings with Friends and Family: Not on file  . Attends Religious Services: Not on file  . Active Member of Clubs or Organizations: Not on file  . Attends Archivist Meetings: Not on file  . Marital Status: Not on  file     Family History: The patient's family history includes Heart attack in his brother, father, and mother. There is no history of Neuromuscular disorder. ROS:   Please see the history of present illness.    All 14 point review of systems negative except as described per history of present illness  EKGs/Labs/Other Studies Reviewed:      Recent Labs: 02/26/2019: TSH 3.49 09/07/2019: ALT 10; BUN 20; Creatinine 1.73; Hemoglobin 14.0; Platelet Count 192; Potassium 4.4; Sodium 141  Recent Lipid Panel    Component Value Date/Time   CHOL 196 04/21/2019 1104   TRIG 155.0 (H) 04/21/2019 1104   HDL 38.00 (L) 04/21/2019 1104   CHOLHDL 5 04/21/2019 1104   VLDL 31.0 04/21/2019 1104   LDLCALC 127 (H) 04/21/2019 1104    Physical Exam:    VS:  BP 126/60   Pulse 70   Ht 6' (1.829 m)   Wt 201 lb 12.8 oz (91.5 kg)   SpO2 97%   BMI 27.37 kg/m     Wt Readings from Last 3 Encounters:  11/03/19 201 lb 12.8 oz (91.5 kg)  09/07/19 200 lb (90.7 kg)  08/23/19 200 lb (90.7 kg)     GEN:  Well nourished, well developed in no acute distress HEENT: Normal NECK: No JVD; No carotid bruits LYMPHATICS: No lymphadenopathy CARDIAC: RRR, no murmurs, no rubs, no gallops RESPIRATORY:  Clear to auscultation without rales, wheezing or rhonchi  ABDOMEN: Soft, non-tender, non-distended MUSCULOSKELETAL:  No edema; No deformity  SKIN: Warm and dry LOWER EXTREMITIES: no swelling NEUROLOGIC:  Alert and oriented x 3 PSYCHIATRIC:  Normal affect   ASSESSMENT:    1. Essential hypertension   2. Abdominal aortic atherosclerosis ([place])   3. MGUS (monoclonal gammopathy of unknown significance)   4. Chronic fatigue   5. Nonsustained ventricular tachycardia (HCC)    PLAN:    In order of problems listed above:  1. Essential hypertension blood pressure well controlled continue present medication. 2. Abdominal aorta sclerosis, no symptoms from that aspect.  Continue present management. 3. Monoclonal  gammopathy of unknown significance still being followed by internal medicine team. 4. Chronic fatigue noted. 5. Nonsustained ventricular tachycardia asymptomatic no dizziness no palpitation no passing out we will get echocardiogram to reassess left ventricle ejection fraction.   Medication Adjustments/Labs and Tests Ordered: Current medicines are reviewed at length with the patient today.  Concerns regarding medicines are outlined above.  Orders Placed This Encounter  Procedures  . EKG 12-Lead   Medication changes: No orders of the defined types were placed in this encounter.   Signed, Park Liter, MD, [facility] 11/03/2019 2:55 PM    [facility]

## 2019-11-09 ENCOUNTER — Other Ambulatory Visit: Payer: Self-pay

## 2019-11-09 ENCOUNTER — Ambulatory Visit (HOSPITAL_BASED_OUTPATIENT_CLINIC_OR_DEPARTMENT_OTHER)
Admission: RE | Admit: 2019-11-09 | Discharge: 2019-11-09 | Disposition: A | Discharge status: Home / Self Care | Payer: Medicare Other | Source: Ambulatory Visit | Attending: Cardiology | Admitting: Cardiology

## 2019-11-09 DIAGNOSIS — I472 Ventricular tachycardia: Secondary | ICD-10-CM | POA: Diagnosis not present

## 2019-11-09 DIAGNOSIS — I4729 Other ventricular tachycardia: Secondary | ICD-10-CM

## 2019-11-09 NOTE — Progress Notes (Signed)
  Echocardiogram 2D Echocardiogram has been performed.  Alex Dunn 11/09/2019, 3:02 PM

## 2019-11-12 ENCOUNTER — Telehealth: Payer: Self-pay | Admitting: Emergency Medicine

## 2019-11-12 NOTE — Telephone Encounter (Signed)
Left message for patient to return call.

## 2019-11-12 NOTE — Telephone Encounter (Signed)
Patient returned call

## 2019-11-16 ENCOUNTER — Ambulatory Visit: Payer: Medicare Other | Attending: Internal Medicine

## 2019-11-16 DIAGNOSIS — Z23 Encounter for immunization: Secondary | ICD-10-CM | POA: Insufficient documentation

## 2019-11-16 NOTE — Progress Notes (Signed)
   Covid-19 Vaccination Clinic  Name:  Alex Dunn    MRN: [id number] DOB: 1935-11-09  11/16/2019  [name] was observed post Covid-19 immunization for 15 minutes without incidence. He was provided with Vaccine Information Sheet and instruction to access the V-Safe system.   [name] was instructed to call 911 with any severe reactions post vaccine: Marland Kitchen Difficulty breathing  . Swelling of your face and throat  . A fast heartbeat  . A bad rash all over your body  . Dizziness and weakness    Immunizations Administered    Name Date Dose VIS Date Route   Pfizer COVID-19 Vaccine 11/16/2019 11:44 AM 0.3 mL 10/15/2019 Intramuscular   Manufacturer: [place]   Lot: [id number]   [place]: [id number]

## 2019-11-17 ENCOUNTER — Telehealth: Payer: Self-pay

## 2019-11-17 ENCOUNTER — Telehealth: Payer: Self-pay | Admitting: Emergency Medicine

## 2019-11-17 NOTE — Telephone Encounter (Signed)
-----   Message from Park Liter, MD sent at 11/10/2019  8:47 PM EST ----- Normal ef, mild-mod Mr, Loyce Dys TR, medical therapy

## 2019-11-17 NOTE — Telephone Encounter (Signed)
The patient has been notified of the Echo result and verbalized understanding.  All questions (if any) were answered. Frederik Schmidt, RN 11/17/2019 3:36 PM

## 2019-11-17 NOTE — Telephone Encounter (Signed)
Left message for patient to return call regarding results  

## 2019-11-23 DIAGNOSIS — N183 Chronic kidney disease, stage 3 unspecified: Secondary | ICD-10-CM | POA: Diagnosis not present

## 2019-11-29 DIAGNOSIS — D631 Anemia in chronic kidney disease: Secondary | ICD-10-CM | POA: Diagnosis not present

## 2019-11-29 DIAGNOSIS — E559 Vitamin D deficiency, unspecified: Secondary | ICD-10-CM | POA: Diagnosis not present

## 2019-11-29 DIAGNOSIS — I129 Hypertensive chronic kidney disease with stage 1 through stage 4 chronic kidney disease, or unspecified chronic kidney disease: Secondary | ICD-10-CM | POA: Diagnosis not present

## 2019-11-29 DIAGNOSIS — N183 Chronic kidney disease, stage 3 unspecified: Secondary | ICD-10-CM | POA: Diagnosis not present

## 2019-11-30 ENCOUNTER — Other Ambulatory Visit: Payer: Self-pay

## 2019-11-30 ENCOUNTER — Emergency Department (HOSPITAL_BASED_OUTPATIENT_CLINIC_OR_DEPARTMENT_OTHER): Payer: Medicare Other

## 2019-11-30 ENCOUNTER — Emergency Department (HOSPITAL_BASED_OUTPATIENT_CLINIC_OR_DEPARTMENT_OTHER)
Admission: EM | Admit: 2019-11-30 | Discharge: 2019-11-30 | Disposition: A | Discharge status: Home / Self Care | Payer: Medicare Other | Attending: Emergency Medicine | Admitting: Emergency Medicine

## 2019-11-30 ENCOUNTER — Encounter (HOSPITAL_BASED_OUTPATIENT_CLINIC_OR_DEPARTMENT_OTHER): Payer: Self-pay | Admitting: Emergency Medicine

## 2019-11-30 DIAGNOSIS — Q612 Polycystic kidney, adult type: Secondary | ICD-10-CM | POA: Insufficient documentation

## 2019-11-30 DIAGNOSIS — Z888 Allergy status to other drugs, medicaments and biological substances status: Secondary | ICD-10-CM | POA: Insufficient documentation

## 2019-11-30 DIAGNOSIS — I10 Essential (primary) hypertension: Secondary | ICD-10-CM | POA: Diagnosis not present

## 2019-11-30 DIAGNOSIS — Z885 Allergy status to narcotic agent status: Secondary | ICD-10-CM | POA: Insufficient documentation

## 2019-11-30 DIAGNOSIS — M5441 Lumbago with sciatica, right side: Secondary | ICD-10-CM | POA: Insufficient documentation

## 2019-11-30 DIAGNOSIS — G8929 Other chronic pain: Secondary | ICD-10-CM

## 2019-11-30 DIAGNOSIS — M545 Low back pain: Secondary | ICD-10-CM | POA: Diagnosis not present

## 2019-11-30 DIAGNOSIS — Z79899 Other long term (current) drug therapy: Secondary | ICD-10-CM | POA: Insufficient documentation

## 2019-11-30 DIAGNOSIS — M25551 Pain in right hip: Secondary | ICD-10-CM | POA: Diagnosis present

## 2019-11-30 MED ORDER — MORPHINE SULFATE (PF) 4 MG/ML IV SOLN
4.0000 mg | Freq: Once | INTRAVENOUS | Status: AC
  Administered 2019-11-30: 4 mg via INTRAMUSCULAR
  Filled 2019-11-30: qty 1

## 2019-11-30 MED ORDER — PREDNISONE 50 MG PO TABS
60.0000 mg | ORAL_TABLET | Freq: Once | ORAL | Status: AC
  Administered 2019-11-30: 60 mg via ORAL
  Filled 2019-11-30: qty 1

## 2019-11-30 MED ORDER — PREDNISONE 50 MG PO TABS: 50.0000 mg | ORAL_TABLET | Freq: Every day | ORAL | 0 refills | Status: AC

## 2019-11-30 MED ORDER — HYDROCODONE-ACETAMINOPHEN 5-325 MG PO TABS: 1.0000 | ORAL_TABLET | ORAL | 0 refills | Status: DC | PRN

## 2019-11-30 MED FILL — HYDROCODON-APAP 5-325: 5-325 | 2 days supply | Qty: 10 | Fill #0

## 2019-11-30 MED FILL — predniSONE 50 MG TABS: 50 | 4 days supply | Qty: 4 | Fill #0

## 2019-11-30 NOTE — Discharge Instructions (Signed)
Do not take your home tramadol while you are taking the Norco/Vicodin.  After your prescription is complete for the Norco you may resume your tramadol.  Follow-up with your back doctor.  I have also given a prescription for steroids to help with the inflammation.  Please take as prescribed.

## 2019-11-30 NOTE — ED Provider Notes (Signed)
[facility] EMERGENCY DEPARTMENT Provider Note   CSN: [id number] Arrival date & time: 11/30/19  [id number]     History Chief Complaint  Patient presents with  . Hip Pain    Alex Dunn is a 84 y.o. male with medical history of polycystic kidney disease, chronic back pain, chronic hip pain, anemia, chronic fatigue, BPH who presents for evaluation of back pain and right hip pain.  Pain has been present x1 week.  Patient states he has chronic back and hip pain.  He is followed by neurology.  Takes tramadol at home.  No recent injury or trauma.  Pain radiates down his posterior right buttocks and ends at his knee.  Denies fever, chills, nausea, vomiting, chest pain, shortness of breath, abdominal pain, paresthesias, IV drug use, bowel or bladder incontinence, saddle paresthesias.  Patient states his pain feels like his "typical pain" however "only worse."  Rates his pain a 8/10.  Denies additional aggravating or alleviating factors.  Pain worse with movement, particularly standing up straight and turning.  History of pain from patient and past medical records.  No interpreter is used.  HPI     Past Medical History:  Diagnosis Date  . ANEMIA-IRON DEFICIENCY 06/23/2007  . BENIGN PROSTATIC HYPERTROPHY 06/23/2007  . Diverticulosis   . GERD 06/23/2007  . Hypertension   . Polycystic kidney disease    stage 3 per wife    Patient Active Problem List   Diagnosis Date Noted  . Nonsustained ventricular tachycardia ([place]) 11/03/2019  . Lumbar radiculopathy 05/06/2019  . MGUS (monoclonal gammopathy of unknown significance) 08/24/2018  . Primary osteoarthritis of both knees 06/30/2018  . DDD (degenerative disc disease), cervical 06/30/2018  . Essential hypertension 04/15/2018  . Diverticulosis 04/15/2018  . Abdominal aortic atherosclerosis ([place]) 04/15/2018  . Elevated hemidiaphragm 04/15/2018  . Polycystic kidney disease 04/06/2018  . Night sweats 03/13/2018  . Chronic fatigue  03/13/2018  . Chronic kidney disease 10/18/2016  . ANEMIA-IRON DEFICIENCY 06/23/2007  . GERD 06/23/2007  . BPH (benign prostatic hyperplasia) 06/23/2007    Past Surgical History:  Procedure Laterality Date  . HAND SURGERY    . LUNG SURGERY     for collapsed lung       Family History  Problem Relation Age of Onset  . Heart attack Mother   . Heart attack Father   . Heart attack Brother        "leaky valve"  . Neuromuscular disorder Neg Hx     Social History   Tobacco Use  . Smoking status: Never Smoker  . Smokeless tobacco: Never Used  Substance Use Topics  . Alcohol use: Never  . Drug use: Never    Home Medications Prior to Admission medications   Medication Sig Start Date End Date Taking? Authorizing Provider  Acetaminophen (TYLENOL PO) Take by mouth as needed.    [provider]  HYDROcodone-acetaminophen (NORCO/VICODIN) 5-325 MG tablet Take 1 tablet by mouth every 4 (four) hours as needed. 11/30/19   Albie Bazin A, PA-C  losartan (COZAAR) 100 MG tablet Take 1 tablet by mouth once daily 07/08/19   Wendling, Crosby Oyster, DO  Multiple Vitamins-Minerals (ICAPS AREDS 2 PO) Take 1 each by mouth daily.    [provider]  pantoprazole (PROTONIX) 40 MG tablet Take 1 tablet (40 mg total) by mouth daily as needed. Patient taking differently: Take 40 mg by mouth daily as needed (heartburn).  10/16/15   Marletta Lor, MD  predniSONE (DELTASONE) 50 MG tablet  Take 1 tablet (50 mg total) by mouth daily for 4 days. 11/30/19 12/04/19  Roshon Duell A, PA-C  traMADol (ULTRAM) 50 MG tablet TAKE 1 TABLET BY MOUTH AS NEEDED 09/21/19   Shelda Pal, DO  VITAMIN D PO Take 2,000 Int'l Units by mouth daily.    [provider]    Allergies    Codeine phosphate and Lexapro [escitalopram oxalate]  Review of Systems   Review of Systems  Constitutional: Negative.   HENT: Negative.   Respiratory: Negative.   Cardiovascular: Negative.    Gastrointestinal: Negative.   Genitourinary: Negative.   Musculoskeletal: Positive for back pain. Negative for arthralgias, gait problem, joint swelling, myalgias, neck pain and neck stiffness.       Right hip pain  Skin: Negative.   Neurological: Negative.   All other systems reviewed and are negative.   Physical Exam Updated Vital Signs BP (!) 170/87 (BP Location: Right Arm)   Pulse 87   Temp 98.5 F (36.9 C) (Oral)   Resp 16   Ht 6' (1.829 m)   Wt 90.7 kg   SpO2 100%   BMI 27.12 kg/m   Physical Exam Physical Exam  Constitutional: Pt appears well-developed and well-nourished. No distress.  HENT:  Head: Normocephalic and atraumatic.  Mouth/Throat: Oropharynx is clear and moist. No oropharyngeal exudate.  Eyes: Conjunctivae are normal.  Neck: Normal range of motion. Neck supple.  Full ROM without pain  Cardiovascular: Normal rate, regular rhythm and intact distal pulses.   Pulmonary/Chest: Effort normal and breath sounds normal. No respiratory distress. Pt has no wheezes.  Abdominal: Soft. Pt exhibits no distension. There is no tenderness, rebound or guarding. No abd bruit or pulsatile mass Musculoskeletal:  Full range of motion of the T-spine and L-spine with flexion, hyperextension, and lateral flexion. No midline tenderness or stepoffs. No tenderness to palpation of the spinous processes of the T-spine or L-spine. Tenderness to right piriformis and over SI joint.  No overlying skin changes.Mild tenderness to palpation of the paraspinous muscles of the L-spine. Positive straight leg raise on right at 45' Lymphadenopathy:    Pt has no cervical adenopathy.  Neurological: Pt is alert. Pt has normal reflexes.  Reflex Scores:      Bicep reflexes are 2+ on the right side and 2+ on the left side.      Brachioradialis reflexes are 2+ on the right side and 2+ on the left side.      Patellar reflexes are 2+ on the right side and 2+ on the left side.      Achilles reflexes are 2+  on the right side and 2+ on the left side. Speech is clear and goal oriented, follows commands Normal 5/5 strength in upper and lower extremities bilaterally including dorsiflexion and plantar flexion, strong and equal grip strength Sensation normal to light and sharp touch Moves extremities without ataxia, coordination intact Normal gait Normal balance No Clonus Skin: Skin is warm and dry. No rash noted or lesions noted. Pt is not diaphoretic. No erythema, ecchymosis,edema or warmth.  Psychiatric: Pt has a normal mood and affect. Behavior is normal.  Nursing note and vitals reviewed. ED Results / Procedures / Treatments   Labs (all labs ordered are listed, but only abnormal results are displayed) Labs Reviewed - No data to display  EKG None  Radiology DG Lumbar Spine Complete  Result Date: 11/30/2019 CLINICAL DATA:  Back pain.  Right leg pain. EXAM: LUMBAR SPINE - COMPLETE 4+ VIEW COMPARISON:  Bone survey 09/02/2018. FINDINGS: Diffuse multilevel degenerative change. No acute bony abnormality identified. No evidence of fracture. Aortoiliac atherosclerotic vascular calcification. IMPRESSION: 1. Diffuse multilevel degenerative change. No acute bony abnormality identified. 2.  Aortoiliac atherosclerotic vascular disease. Electronically Signed   By: Marcello Moores  Register   On: 11/30/2019 11:36   CLINICAL DATA:  Chronic bilateral buttock pain radiating into both hips and legs, worse on the right. Right leg weakness and numbness. Concern for lumbosacral plexopathy.  EXAM: MRI PELVIS WITHOUT AND WITH CONTRAST  TECHNIQUE: Multiplanar multisequence MR imaging of the pelvis was performed both before and after administration of intravenous contrast.  CONTRAST:  83mL MULTIHANCE GADOBENATE DIMEGLUMINE 529 MG/ML IV SOLN  COMPARISON:  CT abdomen pelvis dated May 03, 2018.  FINDINGS: Musculoskeletal: Normal appearance of the lumbosacral plexus. No enlarged nerve roots or nerve root  enhancement. The visualized sciatic nerves are unremarkable. No muscle edema. Bilateral gluteus minimus muscle atrophy. No acute or significant osseous findings.  Urinary Tract:  No abnormality visualized.  Bilateral renal cysts.  Bowel: Extensive sigmoid diverticulosis. Otherwise unremarkable visualized bowel loops. Normal appendix.  Vascular/Lymphatic: No pathologically enlarged lymph nodes. No significant vascular abnormality seen.  Reproductive:  Borderline prostatomegaly.  Other: No free fluid in the pelvis. Small fat containing left inguinal hernia.  IMPRESSION: 1. Normal appearing lumbosacral plexus.   Electronically Signed   By: Titus Dubin M.D.   On: 08/13/2019 19:54 Procedures Procedures (including critical care time)  Medications Ordered in ED Medications  morphine 4 MG/ML injection 4 mg (4 mg Intramuscular Given 11/30/19 1046)  predniSONE (DELTASONE) tablet 60 mg (60 mg Oral Given 11/30/19 1045)    ED Course  I have reviewed the triage vital signs and the nursing notes.  Pertinent labs & imaging results that were available during my care of the patient were reviewed by me and considered in my medical decision making (see chart for details).  84 year old male appears otherwise well presents for evaluation of lumbar pain as well as right hip pain.  Patient with chronic back and right hip pain.  He has had very thorough work-up according to his neurology note.  Patient states this is his worsening of his "normal pain."  He has tenderness to his right lumbar region over his piriformis and SI joint.  He is positive straight leg raise at 45 degrees.  No overlying skin changes.  No red flags for back pain.  Pain worse when he goes to stand up straight and moves.  MRI 08/2019- Normal appearing lumbosacral plexus LP- 07/2019 Normal opening pressure, CSF normal  Per neurology note, Dr. Lavell Anchors 08/2019 patient with chronic lumbar back pain and leg pain.  Right is  always greater than left.  Worse first thing in the morning.  Patient recently had abnormal MGUS had normal bone surveys and bone marrow biopsies.  Tried injections without relief of his symptoms.  Patient has had significant lab work including B12, heavy metals, Lyme monoclonal antibodies.  He is followed by hematology.  EMG showed polyneuropathy however no myopathy, myositis or myasthenia gravis.  Plain film here shows Normal vascular studies without cause for vascular etiology of his pain  Likely with acute on chronic pain. Pain managed here in ED. Ambulatory without difficulty.  Patient to follow up with Neurology who he is followed with for chronic management.  Prescribed steroids for likely sciatica.  Patient with back pain.  No neurological deficits and normal neuro exam.  Patient can walk but states is painful.  No loss of  bowel or bladder control.  No concern for cauda equina.  No fever, night sweats, weight loss, h/o cancer, IVDU.  RICE protocol and pain medicine indicated and discussed with patient.  No tenderness over right hip, overlying skin changes, low suspicion for gout, septic joint, bacterial infectious process, myositis, DVT, compartment syndrome, fracture, dislocation of hip.   Patient discussed with attending physician, Dr. Laverta Baltimore who agrees with treatment, plan and disposition.  The patient has been appropriately medically screened and/or stabilized in the ED. I have low suspicion for any other emergent medical condition which would require further screening, evaluation or treatment in the ED or require inpatient management.     MDM Rules/Calculators/A&P                      Final Clinical Impression(s) / ED Diagnoses Final diagnoses:  Chronic right-sided low back pain with right-sided sciatica    Rx / DC Orders ED Discharge Orders         Ordered    HYDROcodone-acetaminophen (NORCO/VICODIN) 5-325 MG tablet  Every 4 hours PRN     11/30/19 1219    predniSONE  (DELTASONE) 50 MG tablet  Daily     11/30/19 1219           Clayborne Divis A, PA-C 11/30/19 1222    Long, Wonda Olds, MD 12/01/19 1406

## 2019-11-30 NOTE — ED Triage Notes (Signed)
Ptc/o chronic right hip pain with worsening pain for 1 week. Pt endorses freq medical treatment for same. Pt reports radiation into right leg and foot. Pt denies numbness or tingling. Pt reports tramadol at 0800 pta

## 2019-12-02 ENCOUNTER — Encounter: Payer: Self-pay | Admitting: Medical

## 2019-12-02 ENCOUNTER — Other Ambulatory Visit: Payer: Self-pay

## 2019-12-02 ENCOUNTER — Ambulatory Visit: Payer: Medicare Other | Admitting: Medical

## 2019-12-02 VITALS — BP 155/78 | HR 78 | Temp 97.9°F | Resp 16 | Ht 72.0 in | Wt 199.0 lb

## 2019-12-02 DIAGNOSIS — R29898 Other symptoms and signs involving the musculoskeletal system: Secondary | ICD-10-CM

## 2019-12-02 DIAGNOSIS — M545 Low back pain, unspecified: Secondary | ICD-10-CM

## 2019-12-02 DIAGNOSIS — I10 Essential (primary) hypertension: Secondary | ICD-10-CM | POA: Diagnosis not present

## 2019-12-02 NOTE — Patient Instructions (Signed)
For your chronic low back pain, sciatica area pain, hip pain and neuropathy type pain in lower, extremity, I want you to continue one tab norco daily and current prednsione. Then update me on Monday how you are doing. At that point might have you dc norco and start tramadol 1 tab twice daily. Also might add 4 day taper prednisone as you finish current 50 mg daily dose.  Check bp daily. Update me on bp by my chart on Monday.  Redness to extremity come and goes might be rayndauds? Might rx low dose norvasc.  Follow up 7-10 days pcp or as needed.  Follow up with specialist as scheduled.

## 2019-12-02 NOTE — Progress Notes (Addendum)
Subjective:    Patient ID: Alex Dunn, male    DOB: May 08, 1936, 84 y.o.   MRN: [id number]  HPI  Pt has history of some chronic low back pain and some rt hip pain.   Pt expresses has been seen by various specialist. Pt states has seen ortho, neurologist and will soon see neuromuscular specialist in March.  Pt has burning and stinging to his feet.   ED visit the other day summary reads below.  84 year old male appears otherwise well presents for evaluation of lumbar pain as well as right hip pain.  Patient with chronic back and right hip pain.  He has had very thorough work-up according to his neurology note.  Patient states this is his worsening of his "normal pain."  He has tenderness to his right lumbar region over his piriformis and SI joint.  He is positive straight leg raise at 45 degrees.  No overlying skin changes.  No red flags for back pain.  Pain worse when he goes to stand up straight and moves.  MRI 08/2019- Normal appearing lumbosacral plexus LP- 07/2019 Normal opening pressure, CSF normal  Per neurology note, Dr. Lavell Anchors 08/2019 patient with chronic lumbar back pain and leg pain.  Right is always greater than left.  Worse first thing in the morning.  Patient recently had abnormal MGUS had normal bone surveys and bone marrow biopsies.  Tried injections without relief of his symptoms.  Patient has had significant lab work including B12, heavy metals, Lyme monoclonal antibodies.  He is followed by hematology.  EMG showed polyneuropathy however no myopathy, myositis or myasthenia gravis.  Plain film here shows Normal vascular studies without cause for vascular etiology of his pain  Likely with acute on chronic pain. Pain managed here in ED. Ambulatory without difficulty.  Patient to follow up with Neurology who he is followed with for chronic management.  Prescribed steroids for likely sciatica.  Patient with back pain.  No neurological deficits and normal neuro exam.   Patient can walk but states is painful.  No loss of bowel or bladder control.  No concern for cauda equina.  No fever, night sweats, weight loss, h/o cancer, IVDU.  RICE protocol and pain medicine indicated and discussed with patient.  No tenderness over right hip, overlying skin changes, low suspicion for gout, septic joint, bacterial infectious process, myositis, DVT, compartment syndrome, fracture, dislocation of hip.  Pt in the past was on gabapentin low dose 100 mg. It just made him feel real dizzy. Never helped with pain.   Pt has been using tramadol. It does help. But he uses very limited supply. 30 tabs for one month. Pt states it does not cause him to be drowsy.   Pt was given 10 norco at ED. He thinks maybe making him jittery.   Pt has also used prednisone but just one tablet.    Review of Systems  Constitutional: Negative for chills, fatigue and fever.  Respiratory: Negative for cough, chest tightness, shortness of breath and wheezing.   Cardiovascular: Negative for chest pain.  Gastrointestinal: Negative for abdominal pain.  Musculoskeletal: Positive for back pain.  Skin: Negative for rash.  Neurological: Negative for dizziness, seizures, weakness and light-headedness.  Hematological: Negative for adenopathy. Does not bruise/bleed easily.  Psychiatric/Behavioral: Negative for behavioral problems and confusion.    Past Medical History:  Diagnosis Date  . ANEMIA-IRON DEFICIENCY 06/23/2007  . BENIGN PROSTATIC HYPERTROPHY 06/23/2007  . Diverticulosis   . GERD 06/23/2007  . Hypertension   .  Polycystic kidney disease    stage 3 per wife     Social History   Socioeconomic History  . Marital status: Married    Spouse name: Not on file  . Number of children: 1  . Years of education: Not on file  . Highest education level: High school graduate  Occupational History  . Not on file  Tobacco Use  . Smoking status: Never Smoker  . Smokeless tobacco: Never Used  Substance and  Sexual Activity  . Alcohol use: Never  . Drug use: Never  . Sexual activity: Not on file  Other Topics Concern  . Not on file  Social History Narrative   Lives at home with his wife   Retired   Right handed   Caffeine: about 2 cups daily   Social Determinants of Health   Financial Resource Strain:   . Difficulty of Paying Living Expenses: Not on file  Food Insecurity:   . Worried About Charity fundraiser in the Last Year: Not on file  . Ran Out of Food in the Last Year: Not on file  Transportation Needs:   . Lack of Transportation (Medical): Not on file  . Lack of Transportation (Non-Medical): Not on file  Physical Activity:   . Days of Exercise per Week: Not on file  . Minutes of Exercise per Session: Not on file  Stress:   . Feeling of Stress : Not on file  Social Connections:   . Frequency of Communication with Friends and Family: Not on file  . Frequency of Social Gatherings with Friends and Family: Not on file  . Attends Religious Services: Not on file  . Active Member of Clubs or Organizations: Not on file  . Attends Archivist Meetings: Not on file  . Marital Status: Not on file  Intimate Partner Violence:   . Fear of Current or Ex-Partner: Not on file  . Emotionally Abused: Not on file  . Physically Abused: Not on file  . Sexually Abused: Not on file    Past Surgical History:  Procedure Laterality Date  . HAND SURGERY    . LUNG SURGERY     for collapsed lung    Family History  Problem Relation Age of Onset  . Heart attack Mother   . Heart attack Father   . Heart attack Brother        "leaky valve"  . Neuromuscular disorder Neg Hx     Allergies  Allergen Reactions  . Codeine Phosphate Other (See Comments)    Seen odd things crawling up wall  . Lexapro [Escitalopram Oxalate] Other (See Comments)    shaking    Current Outpatient Medications on File Prior to Visit  Medication Sig Dispense Refill  . Acetaminophen (TYLENOL PO) Take by  mouth as needed.    Marland Kitchen HYDROcodone-acetaminophen (NORCO/VICODIN) 5-325 MG tablet Take 1 tablet by mouth every 4 (four) hours as needed. 10 tablet 0  . losartan (COZAAR) 100 MG tablet Take 1 tablet by mouth once daily 90 tablet 1  . Multiple Vitamins-Minerals (ICAPS AREDS 2 PO) Take 1 each by mouth daily.    . pantoprazole (PROTONIX) 40 MG tablet Take 1 tablet (40 mg total) by mouth daily as needed. (Patient taking differently: Take 40 mg by mouth daily as needed (heartburn). ) 90 tablet 3  . predniSONE (DELTASONE) 50 MG tablet Take 1 tablet (50 mg total) by mouth daily for 4 days. 4 tablet 0  . traMADol (ULTRAM) 50 MG  tablet TAKE 1 TABLET BY MOUTH AS NEEDED 30 tablet 2  . VITAMIN D PO Take 2,000 Int'l Units by mouth daily.     No current facility-administered medications on file prior to visit.    BP (!) 160/84 (BP Location: Left Arm, Patient Position: Sitting, Cuff Size: Normal)   Pulse 78   Temp 97.9 F (36.6 C) (Temporal)   Resp 16   Ht 6' (1.829 m)   Wt 199 lb (90.3 kg)   SpO2 98%   BMI 26.99 kg/m       Objective:   Physical Exam  General- No acute distress. Pleasant patient. Neck- Full range of motion, no jvd Lungs- Clear, even and unlabored. Heart- regular rate and rhythm. Neurologic- CNII- XII grossly intact. Back- no midline lumbar tender but bilateral si tenderness.         Assessment & Plan:  For your chronic low back pain, sciatica area pain, hip pain and neuropathy type pain in lower, extremity, I want you to continue one tab norco daily and current prednsione. Then update me on Monday how you are doing. At that point might have you dc norco and start tramadol 1 tab twice daily. Also might add 4 day taper prednisone as you finish current 50 mg daily dose.  Check bp daily. Update me on bp by my chart on Monday.  Redness to extremity come and goes might be rayndauds? Might rx low dose norvasc.  Follow up 7-10 days pcp or as needed.  Follow up with specialist  as scheduled.  40 minutes spent with pt. Time spent reviewing pt comlpex history and prior  specialist work up. Explained approach going forward in light of his frustration with failed attempts to control pain in past.

## 2019-12-03 ENCOUNTER — Ambulatory Visit: Payer: Medicare Other

## 2019-12-04 ENCOUNTER — Ambulatory Visit: Payer: Medicare Other | Attending: Internal Medicine

## 2019-12-04 DIAGNOSIS — Z23 Encounter for immunization: Secondary | ICD-10-CM

## 2019-12-04 NOTE — Progress Notes (Signed)
   Covid-19 Vaccination Clinic  Name:  Alex Dunn    MRN: [id number] DOB: 09-27-1936  12/04/2019  [name] was observed post Covid-19 immunization for 15 minutes without incidence. He was provided with Vaccine Information Sheet and instruction to access the V-Safe system.   [name] was instructed to call 911 with any severe reactions post vaccine: Marland Kitchen Difficulty breathing  . Swelling of your face and throat  . A fast heartbeat  . A bad rash all over your body  . Dizziness and weakness    Immunizations Administered    Name Date Dose VIS Date Route   Pfizer COVID-19 Vaccine 12/04/2019 12:43 PM 0.3 mL 10/15/2019 Intramuscular   Manufacturer: [place]   Lot: [id number]   [place]: [id number]

## 2019-12-06 ENCOUNTER — Telehealth: Payer: Self-pay | Admitting: Medical

## 2019-12-06 ENCOUNTER — Encounter: Payer: Self-pay | Admitting: Medical

## 2019-12-06 MED ORDER — PREDNISONE 10 MG PO TABS: ORAL_TABLET | ORAL | 0 refills | Status: DC

## 2019-12-06 NOTE — Telephone Encounter (Signed)
Brief 4 day taper prednisone sent to pharmacy.

## 2019-12-14 ENCOUNTER — Ambulatory Visit (INDEPENDENT_AMBULATORY_CARE_PROVIDER_SITE_OTHER): Payer: Medicare Other | Admitting: Medical

## 2019-12-14 ENCOUNTER — Other Ambulatory Visit: Payer: Self-pay

## 2019-12-14 ENCOUNTER — Encounter: Payer: Self-pay | Admitting: Medical

## 2019-12-14 ENCOUNTER — Ambulatory Visit (HOSPITAL_BASED_OUTPATIENT_CLINIC_OR_DEPARTMENT_OTHER)
Admission: RE | Admit: 2019-12-14 | Discharge: 2019-12-14 | Disposition: A | Discharge status: Home / Self Care | Payer: Medicare Other | Source: Ambulatory Visit | Attending: Medical | Admitting: Medical

## 2019-12-14 VITALS — BP 139/62 | HR 77 | Temp 97.8°F | Resp 12 | Ht 72.0 in | Wt 199.2 lb

## 2019-12-14 DIAGNOSIS — R600 Localized edema: Secondary | ICD-10-CM | POA: Diagnosis not present

## 2019-12-14 DIAGNOSIS — G8929 Other chronic pain: Secondary | ICD-10-CM | POA: Insufficient documentation

## 2019-12-14 DIAGNOSIS — J9811 Atelectasis: Secondary | ICD-10-CM | POA: Diagnosis not present

## 2019-12-14 DIAGNOSIS — M2550 Pain in unspecified joint: Secondary | ICD-10-CM | POA: Diagnosis not present

## 2019-12-14 DIAGNOSIS — M25572 Pain in left ankle and joints of left foot: Secondary | ICD-10-CM | POA: Diagnosis not present

## 2019-12-14 DIAGNOSIS — M25571 Pain in right ankle and joints of right foot: Secondary | ICD-10-CM | POA: Diagnosis not present

## 2019-12-14 MED ORDER — TRAMADOL HCL 50 MG PO TABS: 50.0000 mg | ORAL_TABLET | Freq: Two times a day (BID) | ORAL | 0 refills | Status: AC | PRN

## 2019-12-14 NOTE — Patient Instructions (Addendum)
Glad to hear your back does feel better.  For your ankle pain and back pain I think taking tramadol twice daily is reasonable. One tab at 6 am and other at 6 pm. Glad to hear no sedation side effects.  For pedal edema around ankles will get cmp, bnp and cxr.  You report ankle pain responsive to voltaren(continue twice daily). Will get sed rate, uric acid and ana. Also bilateral x-rays to see if osteoarthritis. Giving ace wraps to see if they helped.  Follow up date one week or as needed. Well see if Dr. Nani Ravens agrees with tramadol twice daily.

## 2019-12-14 NOTE — Progress Notes (Signed)
Subjective:    Patient ID: Alex Dunn, male    DOB: 1936-02-08, 84 y.o.   MRN: [id number]  HPI  Pt in for follow up.  Pt states his back pain is better.   Pt states his feet both ache. He states when he applies pressure it hurts more.  He states has pain more around his ankles. Not reporting pain in heels. Pain also on outside of his feet.  Pt states second day of prednisone recently feet seemed to get better.  He used tramadol only 50 mg twice a day for one day. Pain was controlled on that day with no adverse side effects.   Pt did try voltaren gel and he states ankle felt.       Review of Systems  Constitutional: Negative for chills, fatigue and fever.  Respiratory: Negative for chest tightness, shortness of breath and wheezing.   Cardiovascular: Negative for chest pain and palpitations.  Musculoskeletal: Positive for back pain.       Lower ext pain. See hpi.  Skin: Negative for rash.  Neurological: Negative for facial asymmetry, speech difficulty and weakness.  Hematological: Negative for adenopathy. Does not bruise/bleed easily.  Psychiatric/Behavioral: Negative for behavioral problems and dysphoric mood.   Past Medical History:  Diagnosis Date  . ANEMIA-IRON DEFICIENCY 06/23/2007  . BENIGN PROSTATIC HYPERTROPHY 06/23/2007  . Diverticulosis   . GERD 06/23/2007  . Hypertension   . Polycystic kidney disease    stage 3 per wife     Social History   Socioeconomic History  . Marital status: Married    Spouse name: Not on file  . Number of children: 1  . Years of education: Not on file  . Highest education level: High school graduate  Occupational History  . Not on file  Tobacco Use  . Smoking status: Never Smoker  . Smokeless tobacco: Never Used  Substance and Sexual Activity  . Alcohol use: Never  . Drug use: Never  . Sexual activity: Not on file  Other Topics Concern  . Not on file  Social History Narrative   Lives at home with his wife   Retired   Right handed   Caffeine: about 2 cups daily   Social Determinants of Health   Financial Resource Strain:   . Difficulty of Paying Living Expenses: Not on file  Food Insecurity:   . Worried About Charity fundraiser in the Last Year: Not on file  . Ran Out of Food in the Last Year: Not on file  Transportation Needs:   . Lack of Transportation (Medical): Not on file  . Lack of Transportation (Non-Medical): Not on file  Physical Activity:   . Days of Exercise per Week: Not on file  . Minutes of Exercise per Session: Not on file  Stress:   . Feeling of Stress : Not on file  Social Connections:   . Frequency of Communication with Friends and Family: Not on file  . Frequency of Social Gatherings with Friends and Family: Not on file  . Attends Religious Services: Not on file  . Active Member of Clubs or Organizations: Not on file  . Attends Archivist Meetings: Not on file  . Marital Status: Not on file  Intimate Partner Violence:   . Fear of Current or Ex-Partner: Not on file  . Emotionally Abused: Not on file  . Physically Abused: Not on file  . Sexually Abused: Not on file    Past Surgical History:  Procedure Laterality Date  . HAND SURGERY    . LUNG SURGERY     for collapsed lung    Family History  Problem Relation Age of Onset  . Heart attack Mother   . Heart attack Father   . Heart attack Brother        "leaky valve"  . Neuromuscular disorder Neg Hx     Allergies  Allergen Reactions  . Codeine Phosphate Other (See Comments)    Seen odd things crawling up wall  . Lexapro [Escitalopram Oxalate] Other (See Comments)    shaking    Current Outpatient Medications on File Prior to Visit  Medication Sig Dispense Refill  . Acetaminophen (TYLENOL PO) Take by mouth as needed.    Marland Kitchen losartan (COZAAR) 100 MG tablet Take 1 tablet by mouth once daily 90 tablet 1  . Multiple Vitamins-Minerals (ICAPS AREDS 2 PO) Take 1 each by mouth daily.    .  pantoprazole (PROTONIX) 40 MG tablet Take 1 tablet (40 mg total) by mouth daily as needed. (Patient taking differently: Take 40 mg by mouth daily as needed (heartburn). ) 90 tablet 3  . traMADol (ULTRAM) 50 MG tablet TAKE 1 TABLET BY MOUTH AS NEEDED 30 tablet 2  . VITAMIN D PO Take 2,000 Int'l Units by mouth daily.     No current facility-administered medications on file prior to visit.    BP 139/62 (BP Location: Right Arm, Cuff Size: Normal)   Pulse 77   Temp 97.8 F (36.6 C) (Temporal)   Resp 12   Ht 6' (1.829 m)   Wt 199 lb 3.2 oz (90.4 kg)   SpO2 99%   BMI 27.02 kg/m       Objective:   Physical Exam  General- No acute distress. Pleasant patient. Neck- Full range of motion, no jvd Lungs- Clear, even and unlabored. Heart- regular rate and rhythm. Neurologic- CNII- XII grossly intact. Lower ext- bilateral lower ankles mild- moderate edema lateral and medial aspect. No warmth on palpation of feet. No direct heel pain on palpation.       Assessment & Plan:  Glad to hear your back does feel better.  For your ankle pain and back pain I think taking tramadol twice daily is reasonable. One tab at 6 am and other at 6 pm. Glad to hear no sedation side effects.  For pedal edema around ankles will get cmp, bnp and cxr.  You report ankle pain responsive to voltaren. Will get sed rate, uric acid and ana. Also bilateral x-rays to see if osteoarthritis. Giving ace wraps to see if they helped.  Follow up date one week or as needed. Well see if Dr. Nani Ravens agrees with tramadol twice daily.  30 minutes spent with pt. 50% of time spent with pt counseling on plan going forward.

## 2019-12-15 LAB — COMPREHENSIVE METABOLIC PANEL
ALT: 13 U/L (ref 0–53)
AST: 12 U/L (ref 0–37)
Albumin: 3.5 g/dL (ref 3.5–5.2)
Alkaline Phosphatase: 89 U/L (ref 39–117)
BUN: 27 mg/dL — ABNORMAL HIGH (ref 6–23)
CO2: 29 mEq/L (ref 19–32)
Calcium: 8.6 mg/dL (ref 8.4–10.5)
Chloride: 105 mEq/L (ref 96–112)
Creatinine, Ser: 1.6 mg/dL — ABNORMAL HIGH (ref 0.40–1.50)
GFR: 41.64 mL/min — ABNORMAL LOW (ref >60.00)
Glucose, Bld: 119 mg/dL — ABNORMAL HIGH (ref 70–99)
Potassium: 4.2 mEq/L (ref 3.5–5.1)
Sodium: 140 mEq/L (ref 135–145)
Total Bilirubin: 0.5 mg/dL (ref 0.2–1.2)
Total Protein: 6.3 g/dL (ref 6.0–8.3)

## 2019-12-15 LAB — SEDIMENTATION RATE: Sed Rate: 17 mm/hr (ref 0–20)

## 2019-12-15 LAB — BRAIN NATRIURETIC PEPTIDE: Pro B Natriuretic peptide (BNP): 54 pg/mL (ref 0.0–100.0)

## 2019-12-15 LAB — URIC ACID: Uric Acid, Serum: 6.1 mg/dL (ref 4.0–7.8)

## 2019-12-16 ENCOUNTER — Encounter: Payer: Self-pay | Admitting: Medical

## 2019-12-17 LAB — ANTI-NUCLEAR AB-TITER (ANA TITER)
ANA TITER: 1:40 {titer} — ABNORMAL HIGH
ANA Titer 1: 1:40 {titer} — ABNORMAL HIGH

## 2019-12-17 LAB — ANA: Anti Nuclear Antibody (ANA): POSITIVE — AB

## 2019-12-27 NOTE — Progress Notes (Signed)
Office Visit Note  Patient: Alex Dunn             Date of Birth: 1935-12-02           MRN: [id number]             PCP: Shelda Pal, DO Referring: Shelda Pal* Visit Date: 12/30/2019 Occupation: @GUAROCC @  Subjective:  Rash on feet and lower back pain   History of Present Illness: Alex Dunn is a 84 y.o. male with history of osteoarthritis.  He was seen last on September 2019 with fatigue and night sweats.  At that time the work-up was completely negative.  His ANA was low titer and ENA was negative.  He states eventually the symptoms improved.  He states that he started having lower back pain radiating to his right lower extremity.  He reports having MRI of the lumbar spine.  He also had injection for the lumbar spine without much relief.  He continues to have discomfort in his lower back.  He has been followed at [facility].  He reports having swelling in bilateral feet and also rash on his feet for the last 6 months.  He continues to experience fatigue.  He reports only occasional stiffness in his hands.  Activities of Daily Living:  Patient reports morning stiffness for 2 hours.   Patient Reports nocturnal pain.  Difficulty dressing/grooming: Denies Difficulty climbing stairs: Reports Difficulty getting out of chair: Reports Difficulty using hands for taps, buttons, cutlery, and/or writing: Reports  Review of Systems  Constitutional: Positive for fatigue. Negative for night sweats.  HENT: Negative for mouth sores, mouth dryness and nose dryness.   Eyes: Negative for redness, itching and dryness.  Respiratory: Negative for shortness of breath and difficulty breathing.   Cardiovascular: Negative for chest pain, palpitations, hypertension, irregular heartbeat and swelling in legs/feet.  Gastrointestinal: Positive for constipation. Negative for blood in stool and diarrhea.  Endocrine: Negative for increased urination.  Genitourinary: Negative for  difficulty urinating and painful urination.  Musculoskeletal: Positive for arthralgias, joint pain and morning stiffness. Negative for joint swelling, myalgias, muscle weakness, muscle tenderness and myalgias.  Skin: Positive for rash. Negative for color change, hair loss, nodules/bumps, skin tightness, ulcers and sensitivity to sunlight.  Allergic/Immunologic: Negative for susceptible to infections.  Neurological: Negative for dizziness, fainting, headaches, memory loss, night sweats and weakness.  Hematological: Negative for bruising/bleeding tendency and swollen glands.  Psychiatric/Behavioral: Negative for depressed mood, confusion and sleep disturbance. The patient is not nervous/anxious.     PMFS History:  Patient Active Problem List   Diagnosis Date Noted  . Nonsustained ventricular tachycardia ([place]) 11/03/2019  . Lumbar radiculopathy 05/06/2019  . MGUS (monoclonal gammopathy of unknown significance) 08/24/2018  . Primary osteoarthritis of both knees 06/30/2018  . DDD (degenerative disc disease), cervical 06/30/2018  . Essential hypertension 04/15/2018  . Diverticulosis 04/15/2018  . Abdominal aortic atherosclerosis ([place]) 04/15/2018  . Elevated hemidiaphragm 04/15/2018  . Polycystic kidney disease 04/06/2018  . Night sweats 03/13/2018  . Chronic fatigue 03/13/2018  . Chronic kidney disease 10/18/2016  . ANEMIA-IRON DEFICIENCY 06/23/2007  . GERD 06/23/2007  . BPH (benign prostatic hyperplasia) 06/23/2007    Past Medical History:  Diagnosis Date  . ANEMIA-IRON DEFICIENCY 06/23/2007  . BENIGN PROSTATIC HYPERTROPHY 06/23/2007  . Diverticulosis   . GERD 06/23/2007  . Hypertension   . Polycystic kidney disease    stage 3 per wife    Family History  Problem Relation Age of Onset  .  Heart attack Mother   . Heart attack Father   . Heart attack Brother        "leaky valve"  . Neuromuscular disorder Neg Hx    Past Surgical History:  Procedure Laterality Date  . HAND SURGERY     . LUNG SURGERY     for collapsed lung   Social History   Social History Narrative   Lives at home with his wife   Retired   Right handed   Caffeine: about 2 cups daily   Immunization History  Administered Date(s) Administered  . Fluad Quad(high Dose 65+) 07/28/2019  . Influenza Split 08/30/2011, 09/01/2012  . Influenza Whole 09/03/2007, 08/11/2008, 09/13/2009, 08/24/2010  . Influenza, High Dose Seasonal PF 09/02/2013, 08/21/2016, 08/27/2017, 09/10/2018  . Influenza,inj,Quad PF,6+ Mos 09/05/2014, 08/01/2015  . Influenza-Unspecified 08/20/2018  . PFIZER SARS-COV-2 Vaccination 11/16/2019, 12/04/2019  . PPD Test 03/09/2018  . Pneumococcal Conjugate-13 09/05/2014  . Pneumococcal Polysaccharide-23 11/09/2009  . Tetanus 09/02/2013  . Zoster 07/31/2014     Objective: Vital Signs: BP (!) 161/83 (BP Location: Left Arm, Patient Position: Sitting, Cuff Size: Normal)   Pulse 78   Resp 14   Ht 6' (1.829 m)   Wt 200 lb 3.2 oz (90.8 kg)   BMI 27.15 kg/m    Physical Exam Vitals and nursing note reviewed.  Constitutional:      Appearance: He is well-developed.  HENT:     Head: Normocephalic and atraumatic.  Eyes:     Conjunctiva/sclera: Conjunctivae normal.     Pupils: Pupils are equal, round, and reactive to light.  Cardiovascular:     Rate and Rhythm: Normal rate and regular rhythm.     Heart sounds: Normal heart sounds.  Pulmonary:     Effort: Pulmonary effort is normal.     Breath sounds: Normal breath sounds.  Abdominal:     General: Bowel sounds are normal.     Palpations: Abdomen is soft.  Musculoskeletal:     Cervical back: Normal range of motion and neck supple.     Right lower leg: Edema present.     Left lower leg: Edema present.  Skin:    General: Skin is warm and dry.     Capillary Refill: Capillary refill takes less than 2 seconds.     Comments: Nail dystrophy in all of his toenails was noted.  His scales and redness on his feet was noted.  Neurological:       Mental Status: He is alert and oriented to person, place, and time.  Psychiatric:        Behavior: Behavior normal.      Musculoskeletal Exam: C-spine was a limited range of motion with discomfort.  He had discomfort range of motion first lumbar spine.  No radiculopathy was elicited.  Shoulder joints, elbow joints, wrist joints were in good range of motion.  He has mild DIP and PIP thickening.  Hip joints, knee joints, ankles with good range of motion.  CDAI Exam: CDAI Score: -- Patient Global: --; Provider Global: -- Swollen: --; Tender: -- Joint Exam 12/30/2019   No joint exam has been documented for this visit   There is currently no information documented on the homunculus. Go to the Rheumatology activity and complete the homunculus joint exam.  Investigation: No additional findings.  Imaging: DG Chest 2 View  Result Date: 12/14/2019 CLINICAL DATA:  Bilateral lower extremity edema. EXAM: CHEST - 2 VIEW COMPARISON:  Chest x-ray 03/06/2018 FINDINGS: The cardiac silhouette, mediastinal and hilar  contours are within normal limits. There is mild tortuosity and calcification of the thoracic aorta. Stable eventration of the right hemidiaphragm with overlying vascular crowding and streaky atelectasis. No infiltrates or effusions. No pulmonary edema. The bony thorax is intact. IMPRESSION: No acute cardiopulmonary findings. Electronically Signed   By: Marijo Sanes M.D.   On: 12/14/2019 17:56   DG Ankle Complete Left  Result Date: 12/14/2019 CLINICAL DATA:  Chronic left ankle pain. EXAM: LEFT ANKLE COMPLETE - 3+ VIEW COMPARISON:  None. FINDINGS: The ankle mortise is maintained. No acute ankle fracture or osteochondral lesion. No definite joint effusion. The subtalar joints are maintained. Moderate size calcaneal heel spur. IMPRESSION: No acute bony findings or significant degenerative changes. Electronically Signed   By: Marijo Sanes M.D.   On: 12/14/2019 17:58   DG Ankle Complete  Right  Result Date: 12/14/2019 CLINICAL DATA:  Chronic right ankle pain and swelling. EXAM: RIGHT ANKLE - COMPLETE 3+ VIEW COMPARISON:  None. FINDINGS: The ankle mortise is maintained. No ankle fracture or osteochondral lesion. No definite joint effusion. The subtalar joints are maintained. Moderate size calcaneal heel spur noted. IMPRESSION: 1. No acute bony findings or significant degenerative changes. 2. Moderate size calcaneal heel spur. Electronically Signed   By: Marijo Sanes M.D.   On: 12/14/2019 17:57    Recent Labs: Lab Results  Component Value Date   WBC 6.5 09/07/2019   HGB 14.0 09/07/2019   PLT 192 09/07/2019   NA 140 12/14/2019   K 4.2 12/14/2019   CL 105 12/14/2019   CO2 29 12/14/2019   GLUCOSE 119 (H) 12/14/2019   BUN 27 (H) 12/14/2019   CREATININE 1.60 (H) 12/14/2019   BILITOT 0.5 12/14/2019   ALKPHOS 89 12/14/2019   AST 12 12/14/2019   ALT 13 12/14/2019   PROT 6.3 12/14/2019   ALBUMIN 3.5 12/14/2019   CALCIUM 8.6 12/14/2019   GFRAA 42 (L) 09/07/2019   QFTBGOLDPLUS NEGATIVE 06/15/2018    Speciality Comments: No specialty comments available.  Procedures:  No procedures performed Allergies: Codeine phosphate and Lexapro [escitalopram oxalate]   Assessment / Plan:     Visit Diagnoses: Positive ANA (antinuclear antibody) -patient has no clinical features of autoimmune disease.  His ANA titer is low which is not significant.  ANA 1:80 Homogeneous, ENA -, beta 2 negative, RF -, Complements WNL, anticardiolipin antibody negative, AVISE score -0.2  Chronic fatigue-he has chronic fatigue.  I believe that is due to deconditioning.  Primary osteoarthritis of both knees - Bilateral moderate with moderate chondromalacia patella.  He continues to have some discomfort in his knee joints.  DDD (degenerative disc disease), cervical-he has been having stiffness and discomfort in his cervical spine.  I reviewed x-rays today which were consistent with degenerative disc  disease.  DDD (degenerative disc disease), lumbar-the x-rays of the lumbar spine were read by radiologist showing degenerative changes.  Patient states he also had MRI of his lumbar spine in the past [facility].  I will refer him to Dr. Rolena Infante for evaluation.  He complains of ongoing lower back discomfort and right-sided radiculopathy.  Rash on feet-he had dry scales on his feet and nail dystrophy.- he may have underlying fungal infection.  I will refer him to dermatology.  Other medical problems are listed as follows:  Gastroesophageal reflux disease without esophagitis  Essential hypertension-his blood pressure is a still elevated.  Have advised him to monitor his blood pressure closely.  Benign prostatic hyperplasia with urinary obstruction  History of chronic kidney  disease - He has been followed up by nephrology.   Diverticulosis  Orders: Orders Placed This Encounter  Procedures  . Ambulatory referral to Orthopedic Surgery  . Ambulatory referral to Dermatology   No orders of the defined types were placed in this encounter.    Follow-Up Instructions: Return if symptoms worsen or fail to improve, for DDD.   Bo Merino, MD  Note - This record has been created using Editor, commissioning.  Chart creation errors have been sought, but may not always  have been located. Such creation errors do not reflect on  the standard of medical care.

## 2019-12-29 ENCOUNTER — Telehealth: Payer: Self-pay | Admitting: Family Medicine

## 2019-12-29 MED ORDER — TRAMADOL HCL 50 MG PO TABS: 50.0000 mg | ORAL_TABLET | Freq: Three times a day (TID) | ORAL | 0 refills | Status: AC | PRN

## 2019-12-29 NOTE — Telephone Encounter (Signed)
Called both cell/and home number left message to call back.

## 2019-12-29 NOTE — Telephone Encounter (Signed)
Called and spoke to his wife did inform of Alex Dunn's response and refill done. Scheduled appointment with PCP on 01/10/2020 at 3 pm The patient is being seen tomorrow by a rheumatologist and they are hopeful to maybe get an answer. The wife is concerned about the patient as having low back pain/leg pain/unstead gait .She will call back to our office if they get no answers tomorrow and feel he needs to be seen before 01/10/20.

## 2019-12-29 NOTE — Telephone Encounter (Signed)
Medication:traMADol (ULTRAM) 50 MG tablet [ PT states he wants to get a refill for at least a month    Has the patient contacted their pharmacy? Yes.   (If no, request that the patient contact the pharmacy for the refill.) (If yes, when and what did the pharmacy advise?)  Preferred Pharmacy (with phone number or street name):   [id number] [address], [address], [place] [address]  Phone:  [phone] Fax:  [phone]   Agent: Please be advised that RX refills may take up to 3 business days. We ask that you follow-up with your pharmacy.

## 2019-12-29 NOTE — Telephone Encounter (Signed)
Last OV  12/14/2019 Last RF---12/14/2019 -- #14 no refills

## 2019-12-29 NOTE — Telephone Encounter (Signed)
Refilled his tramadol. Please get him in with Dr. Nani Ravens as I don't want to over prescribe tramadol. Want to make sure he is aware of treatment plan recently.

## 2019-12-30 ENCOUNTER — Other Ambulatory Visit: Payer: Self-pay

## 2019-12-30 ENCOUNTER — Encounter: Payer: Self-pay | Admitting: Rheumatology

## 2019-12-30 ENCOUNTER — Ambulatory Visit: Payer: Medicare Other | Admitting: Rheumatology

## 2019-12-30 VITALS — BP 161/83 | HR 78 | Resp 14 | Ht 72.0 in | Wt 200.2 lb

## 2019-12-30 DIAGNOSIS — N401 Enlarged prostate with lower urinary tract symptoms: Secondary | ICD-10-CM

## 2019-12-30 DIAGNOSIS — R768 Other specified abnormal immunological findings in serum: Secondary | ICD-10-CM | POA: Diagnosis not present

## 2019-12-30 DIAGNOSIS — M5136 Other intervertebral disc degeneration, lumbar region: Secondary | ICD-10-CM

## 2019-12-30 DIAGNOSIS — R5382 Chronic fatigue, unspecified: Secondary | ICD-10-CM | POA: Diagnosis not present

## 2019-12-30 DIAGNOSIS — R21 Rash and other nonspecific skin eruption: Secondary | ICD-10-CM

## 2019-12-30 DIAGNOSIS — I10 Essential (primary) hypertension: Secondary | ICD-10-CM

## 2019-12-30 DIAGNOSIS — M5030 Other cervical disc degeneration, unspecified cervical region: Secondary | ICD-10-CM | POA: Diagnosis not present

## 2019-12-30 DIAGNOSIS — K219 Gastro-esophageal reflux disease without esophagitis: Secondary | ICD-10-CM

## 2019-12-30 DIAGNOSIS — K5790 Diverticulosis of intestine, part unspecified, without perforation or abscess without bleeding: Secondary | ICD-10-CM

## 2019-12-30 DIAGNOSIS — M170 Bilateral primary osteoarthritis of knee: Secondary | ICD-10-CM | POA: Diagnosis not present

## 2019-12-30 DIAGNOSIS — N138 Other obstructive and reflux uropathy: Secondary | ICD-10-CM

## 2019-12-30 DIAGNOSIS — Z87448 Personal history of other diseases of urinary system: Secondary | ICD-10-CM

## 2019-12-31 ENCOUNTER — Telehealth: Payer: Self-pay | Admitting: Rheumatology

## 2019-12-31 NOTE — Telephone Encounter (Signed)
Patient's wife calling to let you know Dermatologist cannot get patient in for an appointment until April. Patient's feet are hurting, and he cannot wait that long. Patient would like to know if doctor can send him somewhere else, or do they need to do that on their own? Please call to advise.

## 2020-01-03 NOTE — Telephone Encounter (Signed)
I called patient's wife, referral sent referral to [facility].

## 2020-01-07 ENCOUNTER — Other Ambulatory Visit: Payer: Self-pay

## 2020-01-07 DIAGNOSIS — B353 Tinea pedis: Secondary | ICD-10-CM | POA: Diagnosis not present

## 2020-01-07 DIAGNOSIS — B351 Tinea unguium: Secondary | ICD-10-CM | POA: Diagnosis not present

## 2020-01-10 ENCOUNTER — Other Ambulatory Visit: Payer: Self-pay

## 2020-01-10 ENCOUNTER — Encounter: Payer: Self-pay | Admitting: Family Medicine

## 2020-01-10 ENCOUNTER — Ambulatory Visit (INDEPENDENT_AMBULATORY_CARE_PROVIDER_SITE_OTHER): Payer: Medicare Other | Admitting: Family Medicine

## 2020-01-10 VITALS — BP 140/80 | HR 81 | Temp 97.3°F | Ht 72.0 in | Wt 198.1 lb

## 2020-01-10 DIAGNOSIS — R29898 Other symptoms and signs involving the musculoskeletal system: Secondary | ICD-10-CM | POA: Diagnosis not present

## 2020-01-10 DIAGNOSIS — R2689 Other abnormalities of gait and mobility: Secondary | ICD-10-CM | POA: Diagnosis not present

## 2020-01-10 DIAGNOSIS — M545 Low back pain, unspecified: Secondary | ICD-10-CM

## 2020-01-10 DIAGNOSIS — G8929 Other chronic pain: Secondary | ICD-10-CM

## 2020-01-10 HISTORY — DX: Other symptoms and signs involving the musculoskeletal system: R29.898

## 2020-01-10 HISTORY — DX: Other abnormalities of gait and mobility: R26.89

## 2020-01-10 MED ORDER — TRAMADOL HCL 50 MG PO TABS: 50.0000 mg | ORAL_TABLET | Freq: Two times a day (BID) | ORAL | 1 refills | Status: DC | PRN

## 2020-01-10 NOTE — Progress Notes (Signed)
Chief Complaint  Patient presents with  . Back Pain    Discuss pain medication    Subjective: Patient is a 84 y.o. male here for back pain.  He has a complicated hx of chronic b/l lbp, b/l LE weakness/gait difficulty, and MGUS. He has seen a rheumatologist twice, most recently was referred to ortho spine, with Dr Rolena Infante who he is seeing tomorrow. He has seen his Neurologist twice with an unremarkable workup. He saw his heme/onc team who does not believe that these issues are related to his MGUS. PT was not helpful. He had 2 normal MRI's. He saw a sports med specialist who was also unable to help him. The pain is getting worse, weakness seems to be slightly getting worse. OTC remedies are unhelpful. Tramadol seems to help with the pain. He is requesting a rx of this.    ROS: Const: No fevers MSK: +back pain  Past Medical History:  Diagnosis Date  . ANEMIA-IRON DEFICIENCY 06/23/2007  . BENIGN PROSTATIC HYPERTROPHY 06/23/2007  . Diverticulosis   . GERD 06/23/2007  . Hypertension   . MGUS (monoclonal gammopathy of unknown significance)   . Polycystic kidney disease    stage 3 per wife    Objective: BP 140/80 (BP Location: Left Arm, Patient Position: Sitting, Cuff Size: Normal)   Pulse 81   Temp (!) 97.3 F (36.3 C) (Temporal)   Ht 6' (1.829 m)   Wt 198 lb 2 oz (89.9 kg)   SpO2 94%   BMI 26.87 kg/m  General: Awake, appears stated age HEENT: MMM, EOMi Heart: RRR, no murmurs MSK: No ttp over midline lumbar spine or parasp msc or b/l SI jt Neuro: Gait is cautious and slow; no cerebellar signs Lungs: CTAB, no access msc use Psych: Age appropriate judgment and insight, normal affect and mood  Assessment and Plan: Chronic bilateral low back pain without sciatica - Plan: traMADol (ULTRAM) 50 MG tablet  Balance problem  Weakness of both lower extremities  Will rx Tramadol. Discussed risks and benefits of medicine, he would like to proceed. I informed him I do not go any stronger  and he does not want anything stronger. He is seeing [facility] tomorrow. If he is not pleased with the plan, will refer to [facility] for their opinion.  The patient voiced understanding and agreement to the plan.  [place], DO 01/10/20  6:54 PM

## 2020-01-10 NOTE — Patient Instructions (Signed)
Send me a message if we are not progressing with Dr. Valla Leaver plan or if you are not satisfied with it.  Do not drink alcohol, do any illicit/street drugs, drive or do anything that requires alertness while on the tramadol.   Let us know if you need anything.

## 2020-01-11 DIAGNOSIS — M5136 Other intervertebral disc degeneration, lumbar region: Secondary | ICD-10-CM | POA: Diagnosis not present

## 2020-01-11 DIAGNOSIS — M545 Low back pain: Secondary | ICD-10-CM | POA: Diagnosis not present

## 2020-01-20 ENCOUNTER — Other Ambulatory Visit: Payer: Self-pay | Admitting: Family Medicine

## 2020-01-20 DIAGNOSIS — I10 Essential (primary) hypertension: Secondary | ICD-10-CM

## 2020-01-20 MED ORDER — LOSARTAN POTASSIUM 100 MG PO TABS: 100.0000 mg | ORAL_TABLET | Freq: Every day | ORAL | 1 refills | Status: DC

## 2020-01-20 NOTE — Telephone Encounter (Signed)
Medication:losartan (COZAAR) 100 MG tablet   Pt request for 90 day supply    Has the patient contacted their pharmacy? Yes.   (If no, request that the patient contact the pharmacy for the refill.) (If yes, when and what did the pharmacy advise?)  Preferred Pharmacy (with phone number or street name): [id number] [address], [place] [address] Phone:  [phone]  Fax:  [phone]      Agent: Please be advised that RX refills may take up to 3 business days. We ask that you follow-up with your pharmacy.

## 2020-01-20 NOTE — Telephone Encounter (Signed)
Rx sent 

## 2020-01-22 DIAGNOSIS — M5416 Radiculopathy, lumbar region: Secondary | ICD-10-CM | POA: Diagnosis not present

## 2020-01-27 DIAGNOSIS — M545 Low back pain: Secondary | ICD-10-CM | POA: Diagnosis not present

## 2020-01-27 DIAGNOSIS — M5136 Other intervertebral disc degeneration, lumbar region: Secondary | ICD-10-CM | POA: Diagnosis not present

## 2020-01-31 ENCOUNTER — Other Ambulatory Visit: Payer: Self-pay

## 2020-02-01 ENCOUNTER — Encounter: Payer: Self-pay | Admitting: Family Medicine

## 2020-02-01 ENCOUNTER — Ambulatory Visit (INDEPENDENT_AMBULATORY_CARE_PROVIDER_SITE_OTHER): Payer: Medicare Other | Admitting: Family Medicine

## 2020-02-01 VITALS — BP 142/64 | HR 79 | Temp 97.0°F | Ht 72.0 in | Wt 198.5 lb

## 2020-02-01 DIAGNOSIS — R29898 Other symptoms and signs involving the musculoskeletal system: Secondary | ICD-10-CM | POA: Diagnosis not present

## 2020-02-01 DIAGNOSIS — R2689 Other abnormalities of gait and mobility: Secondary | ICD-10-CM

## 2020-02-01 DIAGNOSIS — M25559 Pain in unspecified hip: Secondary | ICD-10-CM | POA: Diagnosis not present

## 2020-02-01 MED ORDER — DULOXETINE HCL 20 MG PO CPEP: 20.0000 mg | ORAL_CAPSULE | Freq: Every day | ORAL | 3 refills | Status: DC

## 2020-02-01 NOTE — Progress Notes (Signed)
Chief Complaint  Patient presents with  . Follow-up    fatigue/pain    Subjective: Patient is a 84 y.o. male here for f/u.  Patient has been dealing with a longstanding history of chronic low back and hip pain that seems to migrate.  He has bilateral leg weakness and unsteadiness.  He is seeing various specialists in the area without significant relief or answers.  He recently saw a back specialist through the orthopedic team and an MRI did not show anything significant.  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  The patient is very frustrated with both lack of answers and improvement.  He has failed Neurontin.  He did try prednisone at some point and did not notice any improvement.  He has gone to physical therapy without improvement as well.  Past Medical History:  Diagnosis Date  . ANEMIA-IRON DEFICIENCY 06/23/2007  . BENIGN PROSTATIC HYPERTROPHY 06/23/2007  . Diverticulosis   . GERD 06/23/2007  . Hypertension   . MGUS (monoclonal gammopathy of unknown significance)   . Polycystic kidney disease    stage 3 per wife    Objective: BP (!) 142/64 (BP Location: Left Arm, Patient Position: Sitting, Cuff Size: Normal)   Pulse 79   Temp (!) 97 F (36.1 C) (Temporal)   Ht 6' (1.829 m)   Wt 198 lb 8 oz (90 kg)   SpO2 100%   BMI 26.92 kg/m  General: Awake, appears stated age MSK: There is no tenderness to palpation.  He has very tight hip external rotators and hamstrings. Neuro: Gait is unstable/unsteady.  He is not veer to one side.  There are no cerebellar signs.  Speech is fluent and goal oriented.  DTRs equal and symmetric. Heart: RRR, no murmurs Lungs: CTAB, no rales, wheezes or rhonchi. No accessory muscle use Psych: Age appropriate judgment and insight, normal affect and mood  Assessment and Plan: Balance problem - Plan: Ambulatory referral to Neurology, DULoxetine (CYMBALTA) 20 MG capsule  Weakness of both legs - Plan:  Ambulatory referral to Neurology  Hip pain - Plan: DULoxetine (CYMBALTA) 20 MG capsule  I gave him stretches and exercises for the piriformis and low back.  I encouraged heat.  We will try Cymbalta.  I would like him to see the [facility] neurology team for second opinion.  Follow-up with me in 1 month if he is unable to get in with a specialist. The patient voiced understanding and agreement to the plan.  Junior, DO 02/01/20  8:06 PM

## 2020-02-01 NOTE — Patient Instructions (Signed)
Let me know if there are cost issues.  If you do not hear anything about your referral in the next 1-2 weeks, call our office and ask for an update.  Heat (pad or rice pillow in microwave) over affected area, 10-15 minutes twice daily.   Piriformis Syndrome Rehab It is normal to feel mild stretching, pulling, tightness, or discomfort as you do these exercises, but you should stop right away if you feel sudden pain or your pain gets worse.  Stretching and range of motion exercises These exercises warm up your muscles and joints and improve the movement and flexibility of your hip and pelvis. These exercises also help to relieve pain, numbness, and tingling. Exercise A: Hip rotators   1. Lie on your back on a firm surface. 2. Pull your left / right knee toward your same shoulder with your left / right hand until your knee is pointing toward the ceiling. Hold your left / right ankle with your other hand. 3. Keeping your knee steady, gently pull your left / right ankle toward your other shoulder until you feel a stretch in your buttocks. 4. Hold this position for 30 seconds. Repeat 2 times. Complete this stretch 3 times per week. Exercise B: Hip extensors 1. Lie on your back on a firm surface. Both of your legs should be straight. 2. Pull your left / right knee to your chest. Hold your leg in this position by holding onto the back of your thigh or the front of your knee. 3. Hold this position for 30 seconds. 4. Slowly return to the starting position. Repeat 2 times. Complete this stretch 3 times per week.  Strengthening exercises These exercises build strength and endurance in your hip and thigh muscles. Endurance is the ability to use your muscles for a long time, even after they get tired. Exercise C: Straight leg raises (hip abductors)    1. Lie on your side with your left / right leg in the top position. Lie so your head, shoulder, knee, and hip line up. Bend your bottom knee to  help you balance. 2. Lift your top leg up 4-6 inches (10-15 cm), keeping your toes pointed straight ahead. 3. Hold this position for 1 second. 4. Slowly lower your leg to the starting position. Let your muscles relax completely. Repeat for a total of 10 repetitions. Repeat 2 times. Complete this exercise 3 times per week. Exercise D: Hip abductors and rotators, quadruped   1. Get on your hands and knees on a firm, lightly padded surface. Your hands should be directly below your shoulders, and your knees should be directly below your hips. 2. Lift your left / right knee out to the side. Keep your knee bent. Do not twist your body. 3. Hold this position for 1 seconds. 4. Slowly lower your leg. Repeat for a total of 10 repetitions.  Repeat 1 times. Complete this exercise 3 times per week. Exercise E: Straight leg raises (hip extensors) 1. Lie on your abdomen on a bed or a firm surface with a pillow under your hips. 2. Squeeze your buttock muscles and lift your left / right thigh off the bed. Do not let your back arch. 3. Hold this position for 3 seconds. 4. Slowly return to the starting position. Let your muscles relax completely before doing another repetition. Repeat 2 times. Complete this exercise 3 times per week.  This information is not intended to replace advice given to you by your health care provider. Make  sure you discuss any questions you have with your health care provider. Document Released: 10/21/2005 Document Revised: 06/25/2016 Document Reviewed: 10/03/2015 Elsevier Interactive Patient Education  [employer].

## 2020-02-10 ENCOUNTER — Telehealth: Payer: Self-pay | Admitting: Family Medicine

## 2020-02-10 NOTE — Telephone Encounter (Signed)
Caller : Mrs. Hontz  Call Back # [phone]  Concern : Patient's wife states that patient has weakness and unable walk . Patient believe that medication may not be helping. Medication is making patient weakness.    DULoxetine (CYMBALTA) 20 MG capsule [id number]    Patient is also going to see a specialist [place]

## 2020-02-11 ENCOUNTER — Other Ambulatory Visit: Payer: Self-pay | Admitting: Family Medicine

## 2020-02-11 NOTE — Telephone Encounter (Signed)
Pt has appt with Neuro scheduled 04/13/2020.  Please advise if pt needs F/U appt and also if he should continue with medication

## 2020-02-11 NOTE — Telephone Encounter (Signed)
Let's stop the medication. Hold off on follow up appointment for now. Ty.

## 2020-02-14 DIAGNOSIS — R5383 Other fatigue: Secondary | ICD-10-CM | POA: Diagnosis not present

## 2020-02-14 DIAGNOSIS — R531 Weakness: Secondary | ICD-10-CM | POA: Diagnosis not present

## 2020-02-14 DIAGNOSIS — M79604 Pain in right leg: Secondary | ICD-10-CM | POA: Diagnosis not present

## 2020-02-14 DIAGNOSIS — M79605 Pain in left leg: Secondary | ICD-10-CM | POA: Diagnosis not present

## 2020-02-14 DIAGNOSIS — R270 Ataxia, unspecified: Secondary | ICD-10-CM | POA: Diagnosis not present

## 2020-02-14 HISTORY — DX: Ataxia, unspecified: R27.0

## 2020-02-14 HISTORY — DX: Pain in right leg: M79.604

## 2020-02-14 NOTE — Telephone Encounter (Signed)
Left detailed message on machine to call back.

## 2020-02-16 DIAGNOSIS — E538 Deficiency of other specified B group vitamins: Secondary | ICD-10-CM | POA: Diagnosis not present

## 2020-02-16 IMAGING — US US RENAL
1 series · 14 of 25 positions shown · non-contrast
Comparison: 05/03/2018 CT.

CLINICAL DATA: 82-year-old male with chronic kidney disease.
Subsequent encounter.

EXAM:
RENAL / URINARY TRACT ULTRASOUND COMPLETE

[Series 1: us renal · 14 of 50 slices shown]
[im 1/50]
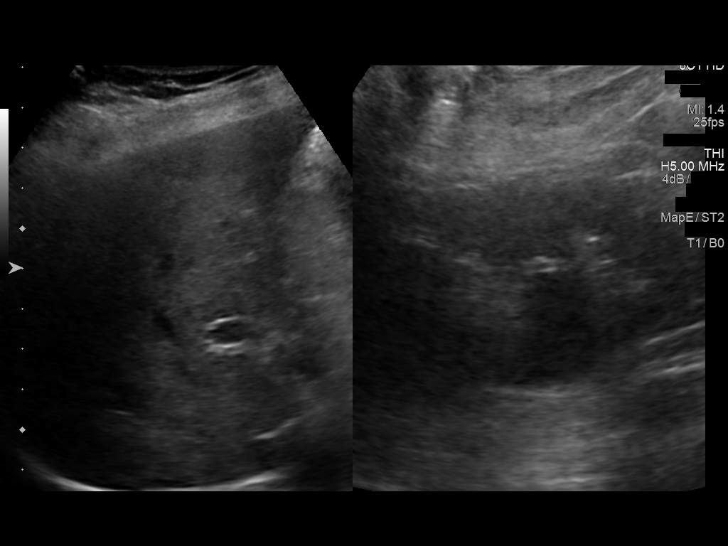
[im 5/50]
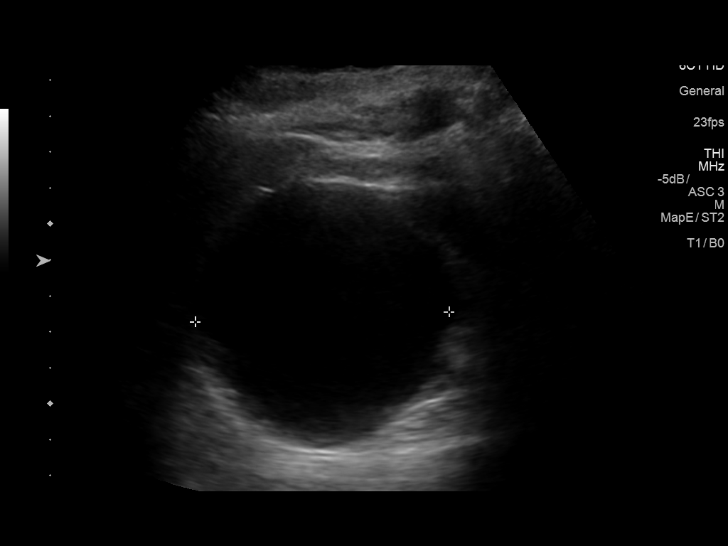
[im 9/50]
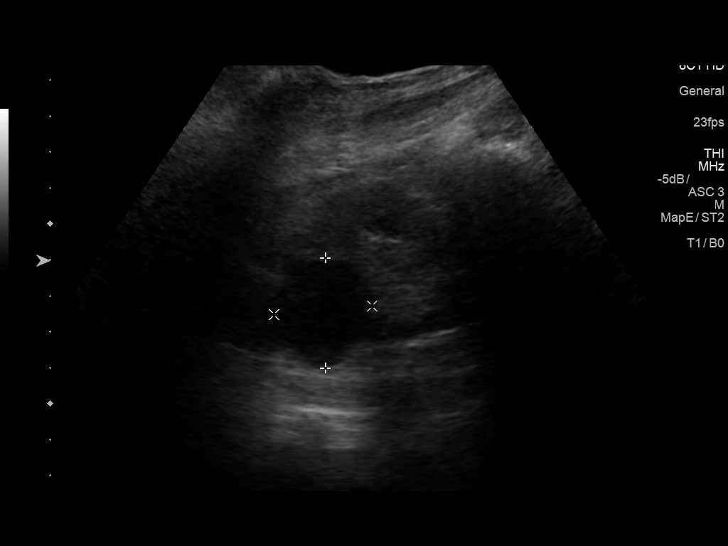
[im 13/50]
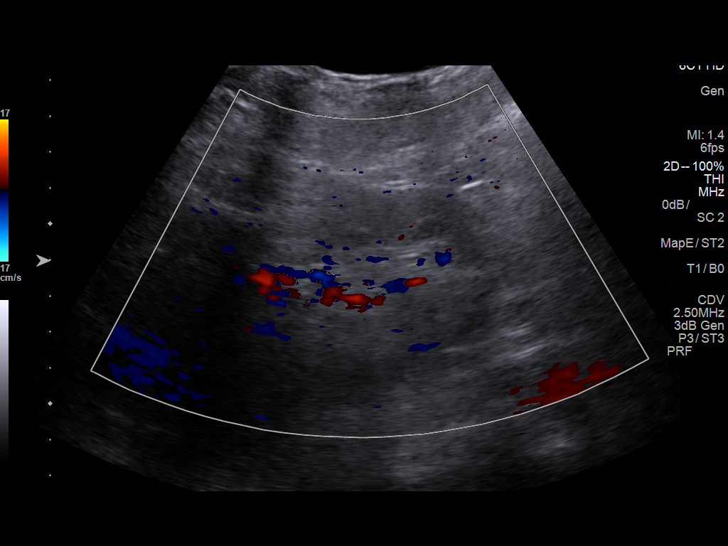
[im 17/50]
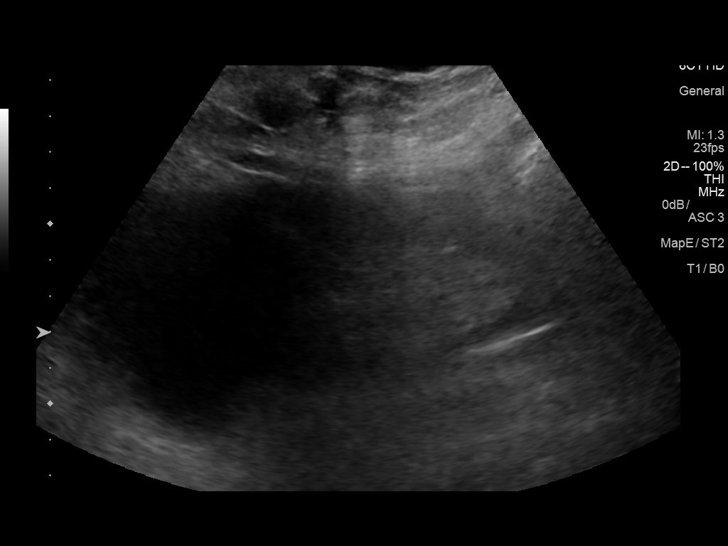
[im 19/50]
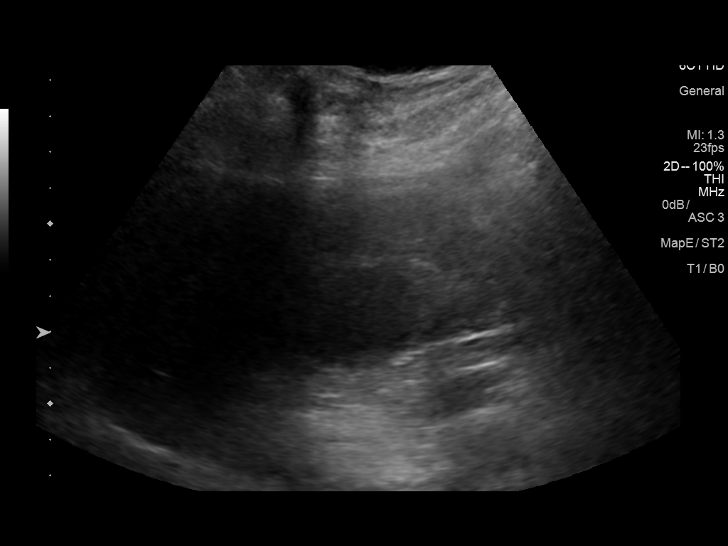
[im 23/50]
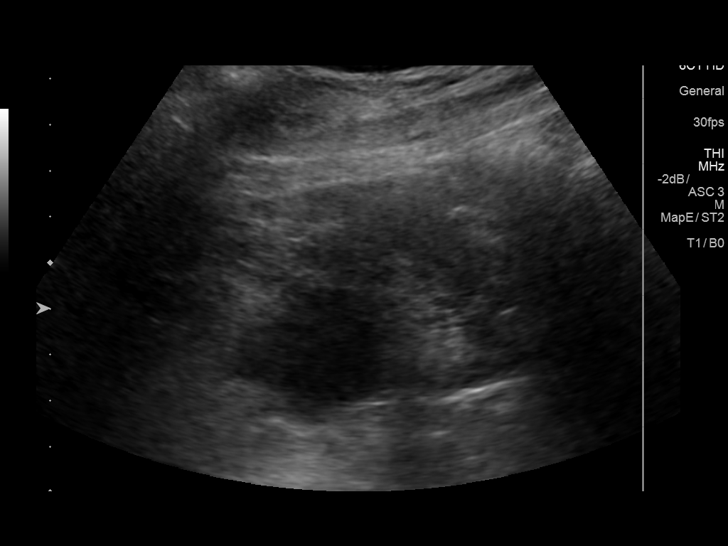
[im 27/50]
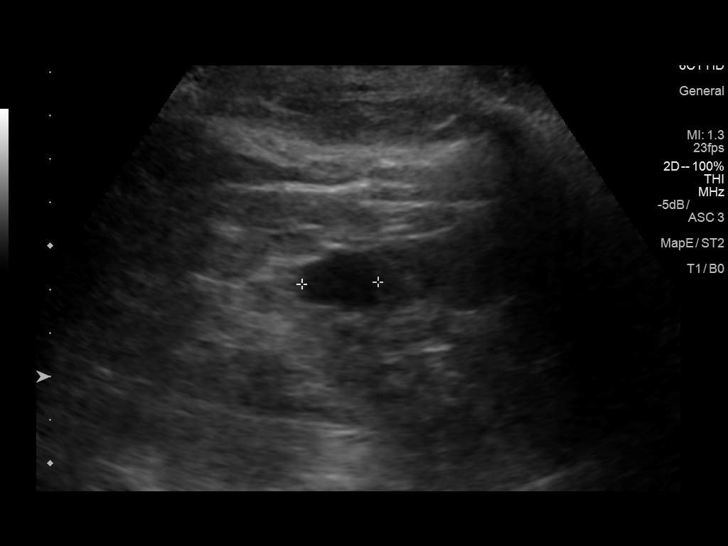
[im 31/50]
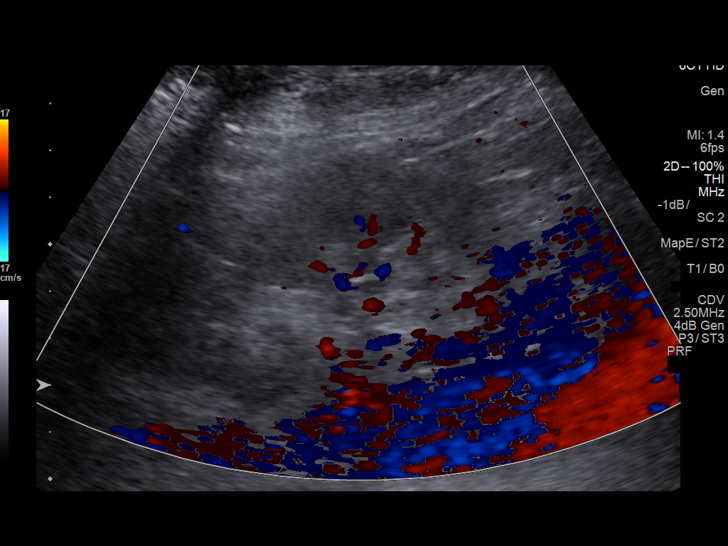
[im 33/50]
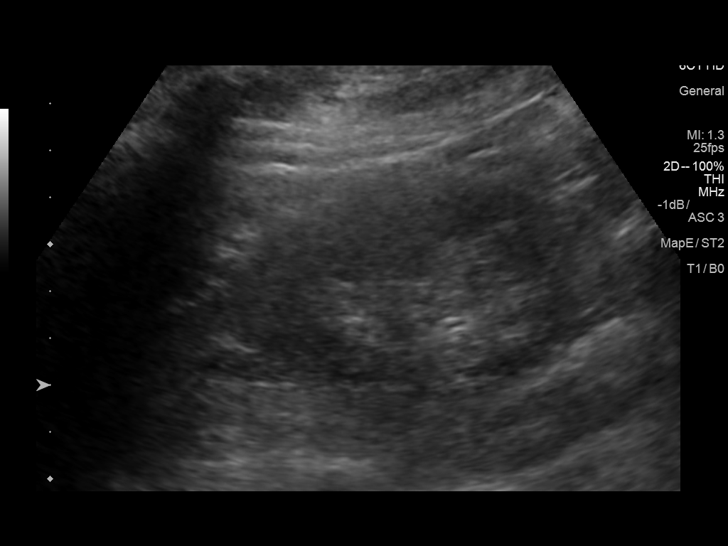
[im 37/50]
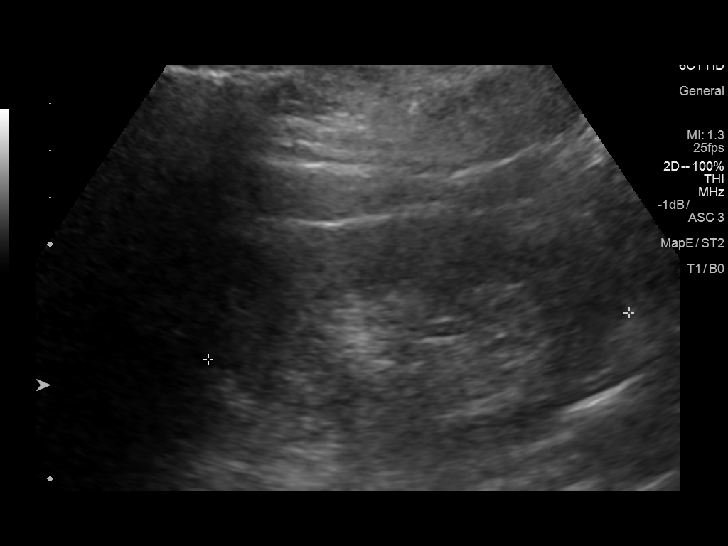
[im 41/50]
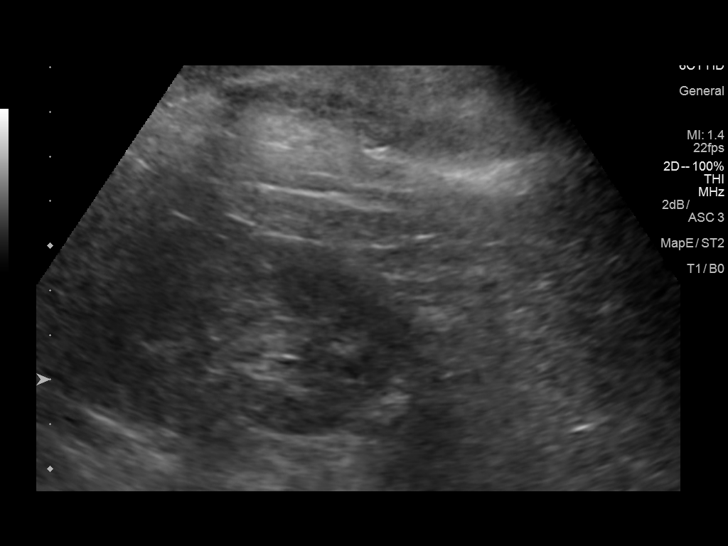
[im 45/50]
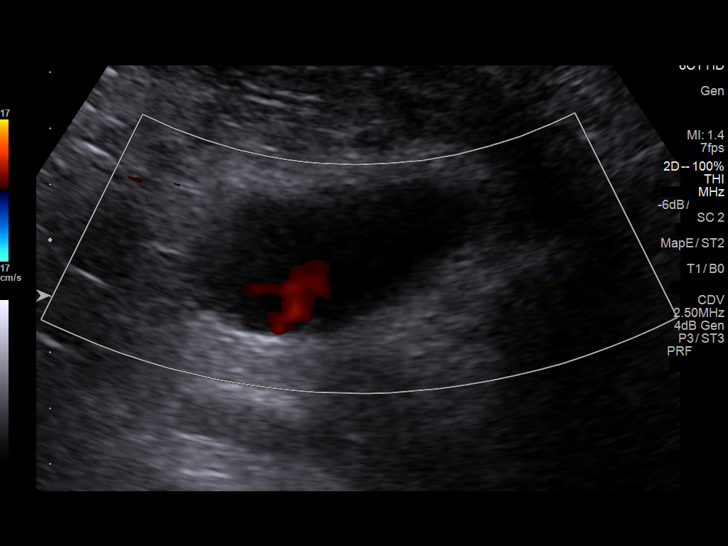
[im 50/50]
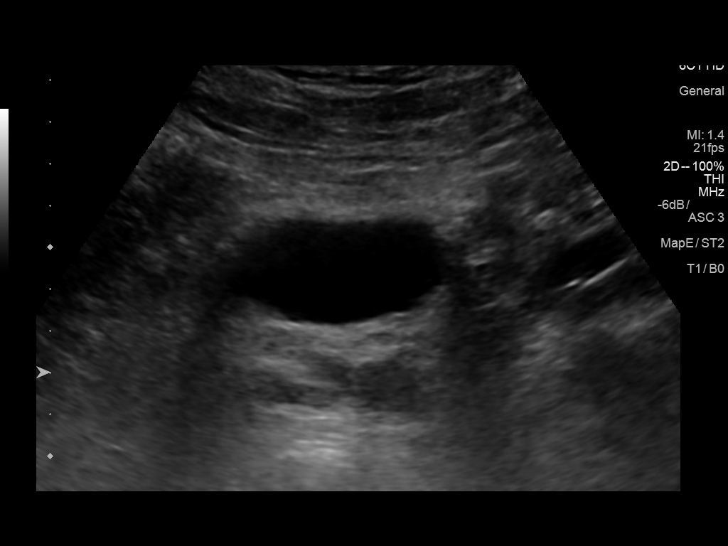

[14 of 25 positions shown; findings below may reference images not displayed]

FINDINGS: Right Kidney:

Length: 12.4 cm. Slight increased echogenicity. No hydronephrosis.
Upper pole 7.1 x 7.2 x 6.5 cm cyst and mid to lower pole 3.3 x 3.1 x
2.7 cm cyst.

Left Kidney:

Length: 9.5 cm. Slight increased echogenicity. No hydronephrosis.
Upper pole 1.8 x 1.4 x 2 cm cyst.

Bladder:

Appears normal for degree of bladder distention.
IMPRESSION: 1. Slight increased renal parenchyma echogenicity bilaterally may
reflect changes of medical renal disease.
2. No hydronephrosis.
3. Bilateral renal cysts largest right upper pole measures up to
cm.

## 2020-02-22 DIAGNOSIS — E538 Deficiency of other specified B group vitamins: Secondary | ICD-10-CM | POA: Diagnosis not present

## 2020-02-22 DIAGNOSIS — M5416 Radiculopathy, lumbar region: Secondary | ICD-10-CM | POA: Diagnosis not present

## 2020-02-22 DIAGNOSIS — M5136 Other intervertebral disc degeneration, lumbar region: Secondary | ICD-10-CM | POA: Diagnosis not present

## 2020-02-29 DIAGNOSIS — E538 Deficiency of other specified B group vitamins: Secondary | ICD-10-CM | POA: Diagnosis not present

## 2020-03-07 ENCOUNTER — Other Ambulatory Visit: Payer: Self-pay

## 2020-03-07 ENCOUNTER — Encounter: Payer: Self-pay | Admitting: Hematology

## 2020-03-07 ENCOUNTER — Inpatient Hospital Stay (HOSPITAL_BASED_OUTPATIENT_CLINIC_OR_DEPARTMENT_OTHER): Payer: Medicare Other | Admitting: Hematology

## 2020-03-07 ENCOUNTER — Inpatient Hospital Stay: Payer: Medicare Other | Attending: Hematology

## 2020-03-07 VITALS — BP 158/66 | HR 75 | Temp 97.5°F | Resp 18 | Ht 72.0 in | Wt 198.8 lb

## 2020-03-07 DIAGNOSIS — D472 Monoclonal gammopathy: Secondary | ICD-10-CM

## 2020-03-07 DIAGNOSIS — M5417 Radiculopathy, lumbosacral region: Secondary | ICD-10-CM | POA: Insufficient documentation

## 2020-03-07 DIAGNOSIS — N183 Chronic kidney disease, stage 3 unspecified: Secondary | ICD-10-CM | POA: Insufficient documentation

## 2020-03-07 DIAGNOSIS — Q613 Polycystic kidney, unspecified: Secondary | ICD-10-CM | POA: Insufficient documentation

## 2020-03-07 LAB — CBC WITH DIFFERENTIAL (CANCER CENTER ONLY)
Abs Immature Granulocytes: 0.02 10*3/uL (ref 0.00–0.07)
Basophils Absolute: 0.1 10*3/uL (ref 0.0–0.1)
Basophils Relative: 1 %
Eosinophils Absolute: 0.2 10*3/uL (ref 0.0–0.5)
Eosinophils Relative: 3 %
HCT: 42.5 % (ref 39.0–52.0)
Hemoglobin: 13.7 g/dL (ref 13.0–17.0)
Immature Granulocytes: 0 %
Lymphocytes Relative: 30 %
Lymphs Abs: 2.1 10*3/uL (ref 0.7–4.0)
MCH: 31.1 pg (ref 26.0–34.0)
MCHC: 32.2 g/dL (ref 30.0–36.0)
MCV: 96.6 fL (ref 80.0–100.0)
Monocytes Absolute: 0.6 10*3/uL (ref 0.1–1.0)
Monocytes Relative: 8 %
Neutro Abs: 4 10*3/uL (ref 1.7–7.7)
Neutrophils Relative %: 58 %
Platelet Count: 199 10*3/uL (ref 150–400)
RBC: 4.4 MIL/uL (ref 4.22–5.81)
RDW: 13 % (ref 11.5–15.5)
WBC Count: 7 10*3/uL (ref 4.0–10.5)
nRBC: 0 % (ref 0.0–0.2)

## 2020-03-07 LAB — CMP (CANCER CENTER ONLY)
ALT: 9 U/L (ref 0–44)
AST: 11 U/L — ABNORMAL LOW (ref 15–41)
Albumin: 3.7 g/dL (ref 3.5–5.0)
Alkaline Phosphatase: 87 U/L (ref 38–126)
Anion gap: 7 (ref 5–15)
BUN: 23 mg/dL (ref 8–23)
CO2: 28 mmol/L (ref 22–32)
Calcium: 9.2 mg/dL (ref 8.9–10.3)
Chloride: 105 mmol/L (ref 98–111)
Creatinine: 1.83 mg/dL — ABNORMAL HIGH (ref 0.61–1.24)
GFR, Est AFR Am: 39 mL/min — ABNORMAL LOW (ref >60)
GFR, Estimated: 34 mL/min — ABNORMAL LOW (ref >60)
Glucose, Bld: 101 mg/dL — ABNORMAL HIGH (ref 70–99)
Potassium: 4.3 mmol/L (ref 3.5–5.1)
Sodium: 140 mmol/L (ref 135–145)
Total Bilirubin: 0.5 mg/dL (ref 0.3–1.2)
Total Protein: 6.6 g/dL (ref 6.5–8.1)

## 2020-03-07 LAB — LACTATE DEHYDROGENASE: LDH: 123 U/L (ref 98–192)

## 2020-03-07 NOTE — Progress Notes (Signed)
[place] OFFICE PROGRESS NOTE  Patient Care Team: Shelda Pal, DO as PCP - General (Family Medicine) Melvenia Beam, MD as Consulting Physician (Neurology) Pedro Earls, MD as Attending Physician (Family Medicine)  HEME/ONC OVERVIEW: 1. IgA kappa MGUS -Previous patient of Dr. Audelia Hives  -08/2018: baseline parameters  Normal Hgb, CKD (Cr 1.5-1.7)  M-spike 0.3 g/dL, free kappa 49, quant IgA ~560  Bone marrow bx: 8% plasma cells on bone marrow biopsy, Congo red stain negative; karyotype 46,XY, no cytogenetic abnormalities  Negative skeletal survey  -On observation   TREATMENT REGIMEN:  Surveillance   PERTINENT NON-HEM/ONC PROBLEMS: 1. Stage III CKD (Cr 1.5-1.7), due to polycystic kidney disease   ASSESSMENT & PLAN:   IgA kappa MGUS -On observation -CBC normal and Cr relatively stable; MM labs pending  -No indication for further work-up at this time -We will continue to monitor his MM labs q51month  Lumbosacral radiculitis  -Patient has seen a number of specialists, including orthopedic surgery, neuropathy and PMR  -[facility] to have lumosacral radiculitis of L5 vs S1 -[facility]-We discussed that MGUS can be rarely associated with neuropathy, but his symptoms, including resolution of the neuropathy with lying down and sitting in a chair, suggest more nerve compression than infiltrating disease  -Continue follow-up with PMR   Stage III CKD -Secondary to polycystic kidney disease  -Baseline Cr 1.5-1.7 -Cr 1.8 today, relatively stable; electrolytes normal -I encouraged patient to follow up with his nephrologist for further management   Orders Placed This Encounter  Procedures  . CBC with Differential (Cancer Center Only)    Standing Status:   Future    Standing Expiration Date:   04/11/2021  . [place] [place]    Standing Status:   Future    Standing  Expiration Date:   04/11/2021  . Multiple Myeloma Panel (SPEP&IFE w/QIG)    Standing Status:   Future    Standing Expiration Date:   04/11/2021  . Kappa/lambda light chains    Standing Status:   Future    Standing Expiration Date:   09/07/2021    The total time spent in the encounter was 30 minutes, including face-to-face time with the patient, review of various tests results, order additional studies/medications, documentation, and coordination of care plan.   All questions were answered. The patient knows to call the clinic with any problems, questions or concerns. No barriers to learning was detected.  Return in 6 months for labs and clinic follow-up.   YTish Men MD 5/4/20211:49 PM  CHIEF COMPLAINT: "My back is still bothering me"  INTERVAL HISTORY: Mr. SBurchreturns clinic for follow-up of IgA kappa MGUS on observation.  Patient reports that he still has pain radiating from the low back to the bilateral lower extremities, R > L, exacerbated by movement, and resolves with lying down and sitting on his couch at home.  He has been to multiple specialists, including orthopedic surgery, neurology, and [facility]  [facility]  He denies any constitutional symptoms.  He denies any other complaint today.  REVIEW OF SYSTEMS:   Constitutional: ( - ) fevers, ( - )  chills , ( - ) night sweats Eyes: ( - ) blurriness of vision, ( - ) double vision, ( - ) watery eyes Ears, nose, mouth, throat, and  face: ( - ) mucositis, ( - ) sore throat Respiratory: ( - ) cough, ( - ) dyspnea, ( - ) wheezes Cardiovascular: ( - ) palpitation, ( - ) chest discomfort, ( - ) lower extremity swelling Gastrointestinal:  ( - ) nausea, ( - ) heartburn, ( - ) change in bowel habits Skin: ( - ) abnormal skin rashes Lymphatics: ( - ) new lymphadenopathy, ( - ) easy bruising Neurological: ( - ) numbness, ( - ) tingling, ( - ) new  weaknesses Behavioral/Psych: ( - ) mood change, ( - ) new changes  All other systems were reviewed with the patient and are negative.  I have reviewed the past medical history, past surgical history, social history and family history with the patient and they are unchanged from previous note.  ALLERGIES:  is allergic to codeine phosphate; pregabalin; and lexapro [escitalopram oxalate].  MEDICATIONS:  Current Outpatient Medications  Medication Sig Dispense Refill  . Acetaminophen (TYLENOL PO) Take by mouth as needed.    Marland Kitchen losartan (COZAAR) 100 MG tablet Take 1 tablet (100 mg total) by mouth daily. 90 tablet 1  . Multiple Vitamins-Minerals (ICAPS AREDS 2 PO) Take 1 each by mouth daily.    . pantoprazole (PROTONIX) 40 MG tablet Take 1 tablet (40 mg total) by mouth daily as needed. (Patient taking differently: Take 40 mg by mouth daily as needed (heartburn). ) 90 tablet 3  . tamsulosin (FLOMAX) 0.4 MG CAPS capsule Take 1 capsule by mouth daily.    . traMADol (ULTRAM) 50 MG tablet Take 1 tablet (50 mg total) by mouth every 12 (twelve) hours as needed for moderate pain. 90 tablet 1  . VITAMIN D PO Take 2,000 Int'l Units by mouth daily.     No current facility-administered medications for this visit.    PHYSICAL EXAMINATION: ECOG PERFORMANCE STATUS: 1 - Symptomatic but completely ambulatory  Today's Vitals   03/07/20 1311  BP: (!) 158/66  Pulse: 75  Resp: 18  Temp: (!) 97.5 F (36.4 C)  TempSrc: Temporal  SpO2: 99%  Weight: 198 lb 12.8 oz (90.2 kg)  Height: 6' (1.829 m)  PainSc: 4    Body mass index is 26.96 kg/m.  Filed Weights   03/07/20 1311  Weight: 198 lb 12.8 oz (90.2 kg)    GENERAL: alert, no distress and comfortable SKIN: skin color, texture, turgor are normal, no rashes or significant lesions EYES: conjunctiva are pink and non-injected, sclera clear OROPHARYNX: no exudate, no erythema; lips, buccal mucosa, and tongue normal  NECK: supple, non-tender LUNGS: clear to  auscultation with normal breathing effort HEART: regular rate & rhythm and no murmurs and no lower extremity edema ABDOMEN: soft, non-tender, non-distended, normal bowel sounds Musculoskeletal: no cyanosis of digits and no clubbing  PSYCH: alert & oriented x 3, fluent speech  LABORATORY DATA:  I have reviewed the data as listed    Component Value Date/Time   NA 140 03/07/2020 1250   NA 142 07/01/2019 1154   K 4.3 03/07/2020 1250   CL 105 03/07/2020 1250   CO2 28 03/07/2020 1250   GLUCOSE 101 (H) 03/07/2020 1250   BUN 23 03/07/2020 1250   BUN 25 07/01/2019 1154   CREATININE 1.83 (H) 03/07/2020 1250   CREATININE 1.61 (H) 02/26/2019 1511   CALCIUM 9.2 03/07/2020 1250   PROT 6.6 03/07/2020 1250   PROT 6.6 07/20/2018 0944   ALBUMIN 3.7 03/07/2020 1250   AST 11 (L) 03/07/2020 1250   ALT 9 03/07/2020 1250  ALKPHOS 87 03/07/2020 1250   BILITOT 0.5 03/07/2020 1250   GFRNONAA 34 (L) 03/07/2020 1250   GFRAA 39 (L) 03/07/2020 1250    No results found for: SPEP, UPEP  Lab Results  Component Value Date   WBC 7.0 03/07/2020   NEUTROABS 4.0 03/07/2020   HGB 13.7 03/07/2020   HCT 42.5 03/07/2020   MCV 96.6 03/07/2020   PLT 199 03/07/2020      Chemistry      Component Value Date/Time   NA 140 03/07/2020 1250   NA 142 07/01/2019 1154   K 4.3 03/07/2020 1250   CL 105 03/07/2020 1250   CO2 28 03/07/2020 1250   BUN 23 03/07/2020 1250   BUN 25 07/01/2019 1154   CREATININE 1.83 (H) 03/07/2020 1250   CREATININE 1.61 (H) 02/26/2019 1511      Component Value Date/Time   CALCIUM 9.2 03/07/2020 1250   ALKPHOS 87 03/07/2020 1250   AST 11 (L) 03/07/2020 1250   ALT 9 03/07/2020 1250   BILITOT 0.5 03/07/2020 1250       RADIOGRAPHIC STUDIES: I have personally reviewed the radiological images as listed below and agreed with the findings in the report. No results found.

## 2020-03-08 LAB — KAPPA/LAMBDA LIGHT CHAINS
Kappa free light chain: 55.2 mg/L — ABNORMAL HIGH (ref 3.3–19.4)
Kappa, lambda light chain ratio: 2.16 — ABNORMAL HIGH (ref 0.26–1.65)
Lambda free light chains: 25.6 mg/L (ref 5.7–26.3)

## 2020-03-10 LAB — MULTIPLE MYELOMA PANEL, SERUM
Albumin SerPl Elph-Mcnc: 3.3 g/dL (ref 2.9–4.4)
Albumin/Glob SerPl: 1.1 (ref 0.7–1.7)
Alpha 1: 0.2 g/dL (ref 0.0–0.4)
Alpha2 Glob SerPl Elph-Mcnc: 0.8 g/dL (ref 0.4–1.0)
B-Globulin SerPl Elph-Mcnc: 1.5 g/dL — ABNORMAL HIGH (ref 0.7–1.3)
Gamma Glob SerPl Elph-Mcnc: 0.8 g/dL (ref 0.4–1.8)
Globulin, Total: 3.3 g/dL (ref 2.2–3.9)
IgA: 532 mg/dL — ABNORMAL HIGH (ref 61–437)
IgG (Immunoglobin G), Serum: 932 mg/dL (ref 603–1613)
IgM (Immunoglobulin M), Srm: 50 mg/dL (ref 15–143)
M Protein SerPl Elph-Mcnc: 0.3 g/dL — ABNORMAL HIGH
Total Protein ELP: 6.6 g/dL (ref 6.0–8.5)

## 2020-03-13 DIAGNOSIS — M79604 Pain in right leg: Secondary | ICD-10-CM | POA: Diagnosis not present

## 2020-03-13 DIAGNOSIS — M79605 Pain in left leg: Secondary | ICD-10-CM | POA: Diagnosis not present

## 2020-03-13 DIAGNOSIS — R531 Weakness: Secondary | ICD-10-CM | POA: Diagnosis not present

## 2020-03-13 DIAGNOSIS — R270 Ataxia, unspecified: Secondary | ICD-10-CM | POA: Diagnosis not present

## 2020-03-23 DIAGNOSIS — M5136 Other intervertebral disc degeneration, lumbar region: Secondary | ICD-10-CM | POA: Diagnosis not present

## 2020-03-23 DIAGNOSIS — M5416 Radiculopathy, lumbar region: Secondary | ICD-10-CM | POA: Diagnosis not present

## 2020-04-13 DIAGNOSIS — M5136 Other intervertebral disc degeneration, lumbar region: Secondary | ICD-10-CM | POA: Diagnosis not present

## 2020-04-13 DIAGNOSIS — M5416 Radiculopathy, lumbar region: Secondary | ICD-10-CM | POA: Diagnosis not present

## 2020-04-21 ENCOUNTER — Other Ambulatory Visit: Payer: Self-pay | Admitting: Nephrology

## 2020-04-21 DIAGNOSIS — R109 Unspecified abdominal pain: Secondary | ICD-10-CM

## 2020-04-21 DIAGNOSIS — N183 Chronic kidney disease, stage 3 unspecified: Secondary | ICD-10-CM | POA: Diagnosis not present

## 2020-04-24 ENCOUNTER — Ambulatory Visit
Admission: RE | Admit: 2020-04-24 | Discharge: 2020-04-24 | Disposition: A | Discharge status: Home / Self Care | Payer: Medicare Other | Source: Ambulatory Visit | Attending: Nephrology | Admitting: Nephrology

## 2020-04-24 DIAGNOSIS — R109 Unspecified abdominal pain: Secondary | ICD-10-CM

## 2020-04-24 DIAGNOSIS — N183 Chronic kidney disease, stage 3 unspecified: Secondary | ICD-10-CM

## 2020-04-27 ENCOUNTER — Emergency Department (HOSPITAL_BASED_OUTPATIENT_CLINIC_OR_DEPARTMENT_OTHER): Payer: Medicare Other

## 2020-04-27 ENCOUNTER — Other Ambulatory Visit: Payer: Self-pay

## 2020-04-27 ENCOUNTER — Emergency Department (HOSPITAL_BASED_OUTPATIENT_CLINIC_OR_DEPARTMENT_OTHER)
Admission: EM | Admit: 2020-04-27 | Discharge: 2020-04-27 | Disposition: A | Discharge status: Home / Self Care | Payer: Medicare Other | Attending: Emergency Medicine | Admitting: Emergency Medicine

## 2020-04-27 ENCOUNTER — Encounter (HOSPITAL_BASED_OUTPATIENT_CLINIC_OR_DEPARTMENT_OTHER): Payer: Self-pay | Admitting: Emergency Medicine

## 2020-04-27 DIAGNOSIS — N189 Chronic kidney disease, unspecified: Secondary | ICD-10-CM | POA: Insufficient documentation

## 2020-04-27 DIAGNOSIS — I700 Atherosclerosis of aorta: Secondary | ICD-10-CM | POA: Diagnosis not present

## 2020-04-27 DIAGNOSIS — M545 Low back pain, unspecified: Secondary | ICD-10-CM

## 2020-04-27 DIAGNOSIS — I129 Hypertensive chronic kidney disease with stage 1 through stage 4 chronic kidney disease, or unspecified chronic kidney disease: Secondary | ICD-10-CM | POA: Diagnosis not present

## 2020-04-27 DIAGNOSIS — Z79891 Long term (current) use of opiate analgesic: Secondary | ICD-10-CM | POA: Diagnosis not present

## 2020-04-27 DIAGNOSIS — K4090 Unilateral inguinal hernia, without obstruction or gangrene, not specified as recurrent: Secondary | ICD-10-CM | POA: Insufficient documentation

## 2020-04-27 DIAGNOSIS — N281 Cyst of kidney, acquired: Secondary | ICD-10-CM | POA: Diagnosis not present

## 2020-04-27 DIAGNOSIS — K5730 Diverticulosis of large intestine without perforation or abscess without bleeding: Secondary | ICD-10-CM | POA: Insufficient documentation

## 2020-04-27 LAB — BASIC METABOLIC PANEL
Anion gap: 11 (ref 5–15)
BUN: 22 mg/dL (ref 8–23)
CO2: 25 mmol/L (ref 22–32)
Calcium: 8.9 mg/dL (ref 8.9–10.3)
Chloride: 102 mmol/L (ref 98–111)
Creatinine, Ser: 1.55 mg/dL — ABNORMAL HIGH (ref 0.61–1.24)
GFR calc Af Amer: 48 mL/min — ABNORMAL LOW (ref >60)
GFR calc non Af Amer: 41 mL/min — ABNORMAL LOW (ref >60)
Glucose, Bld: 100 mg/dL — ABNORMAL HIGH (ref 70–99)
Potassium: 4.5 mmol/L (ref 3.5–5.1)
Sodium: 138 mmol/L (ref 135–145)

## 2020-04-27 LAB — CBC WITH DIFFERENTIAL/PLATELET
Abs Immature Granulocytes: 0.03 10*3/uL (ref 0.00–0.07)
Basophils Absolute: 0 10*3/uL (ref 0.0–0.1)
Basophils Relative: 0 %
Eosinophils Absolute: 0.1 10*3/uL (ref 0.0–0.5)
Eosinophils Relative: 1 %
HCT: 45.4 % (ref 39.0–52.0)
Hemoglobin: 14.6 g/dL (ref 13.0–17.0)
Immature Granulocytes: 0 %
Lymphocytes Relative: 15 %
Lymphs Abs: 1.4 10*3/uL (ref 0.7–4.0)
MCH: 30.9 pg (ref 26.0–34.0)
MCHC: 32.2 g/dL (ref 30.0–36.0)
MCV: 96 fL (ref 80.0–100.0)
Monocytes Absolute: 0.7 10*3/uL (ref 0.1–1.0)
Monocytes Relative: 7 %
Neutro Abs: 7.2 10*3/uL (ref 1.7–7.7)
Neutrophils Relative %: 77 %
Platelets: 182 10*3/uL (ref 150–400)
RBC: 4.73 MIL/uL (ref 4.22–5.81)
RDW: 13 % (ref 11.5–15.5)
WBC: 9.5 10*3/uL (ref 4.0–10.5)
nRBC: 0 % (ref 0.0–0.2)

## 2020-04-27 LAB — URINALYSIS, ROUTINE W REFLEX MICROSCOPIC
Bilirubin Urine: NEGATIVE
Glucose, UA: NEGATIVE mg/dL
Hgb urine dipstick: NEGATIVE
Ketones, ur: NEGATIVE mg/dL
Leukocytes,Ua: NEGATIVE
Nitrite: NEGATIVE
Protein, ur: NEGATIVE mg/dL
Specific Gravity, Urine: 1.025 (ref 1.005–1.030)
pH: 6 (ref 5.0–8.0)

## 2020-04-27 MED ORDER — HYDROCODONE-ACETAMINOPHEN 5-325 MG PO TABS: 1.0000 | ORAL_TABLET | Freq: Four times a day (QID) | ORAL | 0 refills | Status: DC | PRN

## 2020-04-27 NOTE — ED Triage Notes (Signed)
R low back pain x 1 week. Hx of kidney disease and had a renal US Monday.

## 2020-04-27 NOTE — ED Notes (Signed)
Pt transported to CT ?

## 2020-04-27 NOTE — ED Provider Notes (Signed)
[facility] EMERGENCY DEPARTMENT Provider Note   CSN: [id number] Arrival date & time: 04/27/20  1502     History Chief Complaint  Patient presents with  . Back Pain    Alex Dunn is a 84 y.o. male.  Patient is a 84 year old male who presents with back pain.  He complains of right lower back pain for the last week.  Its nonradiating other than he does have occasional radiation to his right abdomen.  There is no radiation down his legs.  No numbness or weakness in his legs.  He has had some prior back issues with radiation down both of his legs.  He recently had an injection done by his orthopedic surgeon in [place].  He said since the injection about 2 weeks ago, the radiation had resolved and his back pain had improved although in the last week he has had this different feeling back pain in his right lower back.  No difficulty with bowel or bladder control although he does urinate frequently during the night.  He did have a recent MRI on his back prior to the injections.  No recent falls or trauma.  No fevers.  No urinary symptoms other than he urinates frequently during the night.  He has been taking tramadol at home which he was previously taking for his back pain with no significant improvement in symptoms.  He says that the pain gets worse when he stands up and walks or twists a certain way but it seems to resolve when he sits down and rest.  He does have a history of polycystic kidney disease and given the pain, had a recent renal ultrasound by his nephrologist at [facility].  He followed up there today and the nephrologist did not feel like his pain was coming from his kidneys and advised him to go to the emergency room.        Past Medical History:  Diagnosis Date  . ANEMIA-IRON DEFICIENCY 06/23/2007  . BENIGN PROSTATIC HYPERTROPHY 06/23/2007  . Diverticulosis   . GERD 06/23/2007  . Hypertension   . MGUS (monoclonal gammopathy of unknown significance)   .  Polycystic kidney disease    stage 3 per wife    Patient Active Problem List   Diagnosis Date Noted  . Weakness of both lower extremities 01/10/2020  . Balance problem 01/10/2020  . Chronic bilateral low back pain without sciatica 01/10/2020  . Nonsustained ventricular tachycardia ([place]) 11/03/2019  . Lumbar radiculopathy 05/06/2019  . MGUS (monoclonal gammopathy of unknown significance) 08/24/2018  . Primary osteoarthritis of both knees 06/30/2018  . DDD (degenerative disc disease), cervical 06/30/2018  . Essential hypertension 04/15/2018  . Diverticulosis 04/15/2018  . Abdominal aortic atherosclerosis ([place]) 04/15/2018  . Elevated hemidiaphragm 04/15/2018  . Polycystic kidney disease 04/06/2018  . Night sweats 03/13/2018  . Chronic fatigue 03/13/2018  . Chronic kidney disease 10/18/2016  . ANEMIA-IRON DEFICIENCY 06/23/2007  . GERD 06/23/2007  . BPH (benign prostatic hyperplasia) 06/23/2007    Past Surgical History:  Procedure Laterality Date  . HAND SURGERY    . LUNG SURGERY     for collapsed lung       Family History  Problem Relation Age of Onset  . Heart attack Mother   . Heart attack Father   . Heart attack Brother        "leaky valve"  . Neuromuscular disorder Neg Hx     Social History   Tobacco Use  . Smoking status: Never Smoker  .  Smokeless tobacco: Never Used  Vaping Use  . Vaping Use: Never used  Substance Use Topics  . Alcohol use: Never  . Drug use: Never    Home Medications Prior to Admission medications   Medication Sig Start Date End Date Taking? Authorizing Provider  Acetaminophen (TYLENOL PO) Take by mouth as needed.    [provider]  HYDROcodone-acetaminophen (NORCO/VICODIN) 5-325 MG tablet Take 1-2 tablets by mouth every 6 (six) hours as needed. 04/27/20   Malvin Johns, MD  losartan (COZAAR) 100 MG tablet Take 1 tablet (100 mg total) by mouth daily. 01/20/20   Shelda Pal, DO  Multiple Vitamins-Minerals (ICAPS  AREDS 2 PO) Take 1 each by mouth daily.    [provider]  pantoprazole (PROTONIX) 40 MG tablet Take 1 tablet (40 mg total) by mouth daily as needed. Patient taking differently: Take 40 mg by mouth daily as needed (heartburn).  10/16/15   Marletta Lor, MD  tamsulosin (FLOMAX) 0.4 MG CAPS capsule Take 1 capsule by mouth daily.    [provider]  traMADol (ULTRAM) 50 MG tablet Take 1 tablet (50 mg total) by mouth every 12 (twelve) hours as needed for moderate pain. 01/10/20   Shelda Pal, DO  VITAMIN D PO Take 2,000 Int'l Units by mouth daily.    [provider]    Allergies    Codeine phosphate, Pregabalin, and Lexapro [escitalopram oxalate]  Review of Systems   Review of Systems  Constitutional: Negative for chills, diaphoresis, fatigue and fever.  HENT: Negative for congestion, rhinorrhea and sneezing.   Eyes: Negative.   Respiratory: Negative for cough, chest tightness and shortness of breath.   Cardiovascular: Negative for chest pain and leg swelling.  Gastrointestinal: Negative for abdominal pain, blood in stool, diarrhea, nausea and vomiting.  Genitourinary: Positive for flank pain. Negative for difficulty urinating, frequency and hematuria.  Musculoskeletal: Positive for back pain. Negative for arthralgias.  Skin: Negative for rash.  Neurological: Negative for dizziness, speech difficulty, weakness, numbness and headaches.    Physical Exam Updated Vital Signs BP (!) 139/50 (BP Location: Right Arm)   Pulse 65   Temp 99.1 F (37.3 C) (Oral)   Resp 16   Ht 6' (1.829 m)   Wt 90.7 kg   SpO2 98%   BMI 27.12 kg/m   Physical Exam Constitutional:      Appearance: He is well-developed.  HENT:     Head: Normocephalic and atraumatic.  Eyes:     Pupils: Pupils are equal, round, and reactive to light.  Cardiovascular:     Rate and Rhythm: Normal rate and regular rhythm.     Heart sounds: Normal heart sounds.  Pulmonary:      Effort: Pulmonary effort is normal. No respiratory distress.     Breath sounds: Normal breath sounds. No wheezing or rales.  Chest:     Chest wall: No tenderness.  Abdominal:     General: Bowel sounds are normal.     Palpations: Abdomen is soft.     Tenderness: There is no abdominal tenderness. There is no guarding or rebound.  Musculoskeletal:        General: Normal range of motion.     Cervical back: Normal range of motion and neck supple.     Comments: No leg swelling, pedal pulses intact, there is no palpable tenderness along the spine or musculature.  No pain over the sciatic nerve.  Lymphadenopathy:     Cervical: No cervical adenopathy.  Skin:  General: Skin is warm and dry.     Findings: No rash.  Neurological:     Mental Status: He is alert and oriented to person, place, and time.     Comments: Motor 5 out of 5 all extremities, sensation grossly intact to light touch all extremities, negative straight leg raise bilaterally     ED Results / Procedures / Treatments   Labs (all labs ordered are listed, but only abnormal results are displayed) Labs Reviewed  BASIC METABOLIC PANEL - Abnormal; Notable for the following components:      Result Value   Glucose, Bld 100 (*)    Creatinine, Ser 1.55 (*)    GFR calc non Af Amer 41 (*)    GFR calc Af Amer 48 (*)    All other components within normal limits  URINALYSIS, ROUTINE W REFLEX MICROSCOPIC  CBC WITH DIFFERENTIAL/PLATELET    EKG None  Radiology CT Renal Stone Study  Result Date: 04/27/2020 CLINICAL DATA:  Right lower back pain x1 week. EXAM: CT ABDOMEN AND PELVIS WITHOUT CONTRAST TECHNIQUE: Multidetector CT imaging of the abdomen and pelvis was performed following the standard protocol without IV contrast. COMPARISON:  May 03, 2018 FINDINGS: Lower chest: Mild atelectasis is seen within the posterior aspect of the right lung base. Hepatobiliary: No focal liver abnormality is seen. No gallstones, gallbladder wall  thickening, or biliary dilatation. Pancreas: Unremarkable. No pancreatic ductal dilatation or surrounding inflammatory changes. Spleen: Normal in size without focal abnormality. Adrenals/Urinary Tract: Adrenal glands are unremarkable. Kidneys are normal in size, without renal calculi or hydronephrosis. Multiple stable bilateral renal cysts are seen. Bladder is unremarkable. Stomach/Bowel: Stomach is within normal limits. Appendix appears normal. No evidence of bowel dilatation. Noninflamed diverticula are seen throughout the large bowel. Vascular/Lymphatic: There is moderate severity calcification of the abdominal aorta. No enlarged abdominal or pelvic lymph nodes. Reproductive: There is mild to moderate severity prostate gland enlargement. Other: There is a 4.3 cm x 2.8 cm fat containing left inguinal hernia. Musculoskeletal: Multilevel degenerative changes seen throughout the lumbar spine. IMPRESSION: 1. Colonic diverticulosis without evidence of acute diverticulitis. 2. Multiple stable bilateral renal cysts. 3. Fat containing left inguinal hernia. 4. Aortic atherosclerosis. Aortic Atherosclerosis (ICD10-I70.0). Electronically Signed   By: Virgina Norfolk M.D.   On: 04/27/2020 18:13    Procedures Procedures (including critical care time)  Medications Ordered in ED Medications - No data to display  ED Course  I have reviewed the triage vital signs and the nursing notes.  Pertinent labs & imaging results that were available during my care of the patient were reviewed by me and considered in my medical decision making (see chart for details).    MDM Rules/Calculators/A&P                          Patient presents with right-sided back pain.  It seems to be musculoskeletal in nature.  It is worse with standing and walking and better when he sits down.  He has no radicular symptoms.  No suggestions of cauda equina.  Given the ongoing pain that radiates toward his abdomen, I did do a CT scan of his  abdomen pelvis which showed no acute abnormalities.  His creatinine is at baseline.  His renal cyst are stable.  He has no suggestions of a UTI.  His aorta is normal caliber.  He was discharged home in good condition.  He was given a short prescription for Vicodin.  He was  has a follow-up appointment with his orthopedist at [facility] on Thursday.  Return precautions were given. Final Clinical Impression(s) / ED Diagnoses Final diagnoses:  Acute right-sided low back pain without sciatica    Rx / DC Orders ED Discharge Orders         Ordered    HYDROcodone-acetaminophen (NORCO/VICODIN) 5-325 MG tablet  Every 6 hours PRN     Discontinue  Reprint     04/27/20 1914           Malvin Johns, MD 04/27/20 1916

## 2020-05-04 DIAGNOSIS — M5136 Other intervertebral disc degeneration, lumbar region: Secondary | ICD-10-CM | POA: Diagnosis not present

## 2020-05-04 DIAGNOSIS — M5416 Radiculopathy, lumbar region: Secondary | ICD-10-CM | POA: Diagnosis not present

## 2020-05-24 ENCOUNTER — Telehealth: Payer: Self-pay | Admitting: Family Medicine

## 2020-05-24 NOTE — Telephone Encounter (Signed)
Left message for patient to call back and schedule Medicare Annual Wellness Visit (AWV) with Nurse Health Advisor   This should be a 47 MINUTE VISIT.  Last AWV 04/19/19

## 2020-05-31 ENCOUNTER — Other Ambulatory Visit: Payer: Self-pay

## 2020-05-31 ENCOUNTER — Other Ambulatory Visit: Payer: Self-pay | Admitting: Family Medicine

## 2020-05-31 ENCOUNTER — Encounter: Payer: Self-pay | Admitting: Family Medicine

## 2020-05-31 ENCOUNTER — Ambulatory Visit (INDEPENDENT_AMBULATORY_CARE_PROVIDER_SITE_OTHER): Payer: Medicare Other | Admitting: Family Medicine

## 2020-05-31 ENCOUNTER — Ambulatory Visit (HOSPITAL_BASED_OUTPATIENT_CLINIC_OR_DEPARTMENT_OTHER)
Admission: RE | Admit: 2020-05-31 | Discharge: 2020-05-31 | Disposition: A | Discharge status: Home / Self Care | Payer: Medicare Other | Source: Ambulatory Visit | Attending: Family Medicine | Admitting: Family Medicine

## 2020-05-31 VITALS — BP 138/68 | HR 87 | Temp 98.9°F | Ht 73.0 in | Wt 191.0 lb

## 2020-05-31 DIAGNOSIS — M545 Low back pain, unspecified: Secondary | ICD-10-CM

## 2020-05-31 DIAGNOSIS — Q613 Polycystic kidney, unspecified: Secondary | ICD-10-CM

## 2020-05-31 DIAGNOSIS — G8929 Other chronic pain: Secondary | ICD-10-CM | POA: Diagnosis not present

## 2020-05-31 DIAGNOSIS — R079 Chest pain, unspecified: Secondary | ICD-10-CM

## 2020-05-31 DIAGNOSIS — N182 Chronic kidney disease, stage 2 (mild): Secondary | ICD-10-CM

## 2020-05-31 MED ORDER — TRAMADOL HCL 50 MG PO TABS: 50.0000 mg | ORAL_TABLET | Freq: Two times a day (BID) | ORAL | 1 refills | Status: DC | PRN

## 2020-05-31 NOTE — Patient Instructions (Addendum)
We will be in touch regarding your X-ray results.   Heat (pad or rice pillow in microwave) over affected area, 10-15 minutes twice daily.   Ice/cold pack over area for 10-15 min twice daily.  Try to stretch your side (if X-ray results are normal).    Let us know if you need anything.

## 2020-05-31 NOTE — Progress Notes (Signed)
Musculoskeletal Exam  Patient: Alex Dunn DOB: 1936/03/15  DOS: 05/31/2020  SUBJECTIVE:  Chief Complaint:   Chief Complaint  Patient presents with  . Back Pain    Alex Dunn is a 84 y.o.  male for evaluation and treatment of his back pain. Here w his spouse.   Onset:  1 month ago.  No inj or change in activity.  Location: lower R/ride abd wall Character:  aching and sharp  Progression of issue:  has worsened Associated symptoms: worse w certain movements Not affected by eating, urination, defecation. Denies bowel/bladder incontinence or weakness Treatment: to date has been tramadol w some relief. Went to ED, unremarkable CT scan for intra-abd issues.    It did show atelectasis. He has a hx of spont pneumothorax while in [employer] and states it feels similar to that. No sob or cough.  Neurovascular symptoms: no  Past Medical History:  Diagnosis Date  . ANEMIA-IRON DEFICIENCY 06/23/2007  . BENIGN PROSTATIC HYPERTROPHY 06/23/2007  . Diverticulosis   . GERD 06/23/2007  . Hypertension   . MGUS (monoclonal gammopathy of unknown significance)   . Polycystic kidney disease    stage 3 per wife    Objective:  VITAL SIGNS: BP (!) 138/68 (BP Location: Right Arm, Patient Position: Sitting, Cuff Size: Normal)   Pulse 87   Temp 98.9 F (37.2 C) (Oral)   Ht 6\' 1"  (2.947 m)   Wt 191 lb (86.6 kg)   SpO2 96%   BMI 25.20 kg/m  Constitutional: Well formed, well developed. No acute distress. HENT: Normocephalic, atraumatic.  Thorax & Lungs:  CTAB. I did not hear any diminished breath sounds. No accessory muscle use Skin: Warm. Dry. No erythema. No rash.  Musculoskeletal: low back.   Tenderness to palpation: no posterior ttp, there was some ttp over R abd wall region Deformity: no Ecchymosis: no Straight leg test: negative for Poor hamstring flexibility b/l. Neurologic: Normal sensory function. No focal deficits noted. DTR's equal and symmetric in LE's. No  clonus. Psychiatric: Normal mood. Age appropriate judgment and insight. Alert & oriented x 3.    Assessment:  Right-sided chest pain - Plan: DG Chest 2 View  Chronic bilateral low back pain without sciatica - Plan: traMADol (ULTRAM) 50 MG tablet  Plan: Ck CXR. IF neg, will tx for msk issue. If pos, will see about getting in with pulm by end of week. Will consider msc relaxant.  Stretches/exercises, heat, ice, Tylenol. F/u prn. The patient and his wife voiced understanding and agreement to the plan.   [place], DO 05/31/20  2:35 PM

## 2020-06-01 ENCOUNTER — Other Ambulatory Visit: Payer: Self-pay | Admitting: Family Medicine

## 2020-06-01 MED ORDER — BACLOFEN 10 MG PO TABS
10.0000 mg | ORAL_TABLET | Freq: Three times a day (TID) | ORAL | 0 refills | Status: DC
Start: 2020-06-01 — End: 2020-11-15

## 2020-06-13 DIAGNOSIS — M79604 Pain in right leg: Secondary | ICD-10-CM | POA: Diagnosis not present

## 2020-06-13 DIAGNOSIS — R270 Ataxia, unspecified: Secondary | ICD-10-CM | POA: Diagnosis not present

## 2020-06-13 DIAGNOSIS — R531 Weakness: Secondary | ICD-10-CM | POA: Diagnosis not present

## 2020-06-13 DIAGNOSIS — E538 Deficiency of other specified B group vitamins: Secondary | ICD-10-CM | POA: Insufficient documentation

## 2020-06-13 HISTORY — DX: Deficiency of other specified B group vitamins: E53.8

## 2020-06-21 DIAGNOSIS — Q619 Cystic kidney disease, unspecified: Secondary | ICD-10-CM | POA: Diagnosis not present

## 2020-06-21 DIAGNOSIS — I129 Hypertensive chronic kidney disease with stage 1 through stage 4 chronic kidney disease, or unspecified chronic kidney disease: Secondary | ICD-10-CM | POA: Diagnosis not present

## 2020-06-21 DIAGNOSIS — D472 Monoclonal gammopathy: Secondary | ICD-10-CM | POA: Diagnosis not present

## 2020-06-21 DIAGNOSIS — N1832 Chronic kidney disease, stage 3b: Secondary | ICD-10-CM

## 2020-06-21 HISTORY — DX: Chronic kidney disease, stage 3b: N18.32

## 2020-06-29 DIAGNOSIS — N281 Cyst of kidney, acquired: Secondary | ICD-10-CM | POA: Diagnosis not present

## 2020-07-11 ENCOUNTER — Ambulatory Visit: Payer: Medicare Other | Attending: Internal Medicine

## 2020-07-11 DIAGNOSIS — Z23 Encounter for immunization: Secondary | ICD-10-CM

## 2020-07-11 NOTE — Progress Notes (Signed)
   Covid-19 Vaccination Clinic  Name:  DAMOND BORCHERS    MRN: [id number] DOB: 03/17/1936  07/11/2020  Mr. Bohman was observed post Covid-19 immunization for 15 minutes without incident. He was provided with Vaccine Information Sheet and instruction to access the V-Safe system.   Mr. Leiner was instructed to call 911 with any severe reactions post vaccine: Marland Kitchen Difficulty breathing  . Swelling of face and throat  . A fast heartbeat  . A bad rash all over body  . Dizziness and weakness

## 2020-07-15 ENCOUNTER — Other Ambulatory Visit: Payer: Self-pay | Admitting: Family Medicine

## 2020-07-15 DIAGNOSIS — I10 Essential (primary) hypertension: Secondary | ICD-10-CM

## 2020-08-08 DIAGNOSIS — N1832 Chronic kidney disease, stage 3b: Secondary | ICD-10-CM | POA: Diagnosis not present

## 2020-08-15 DIAGNOSIS — I129 Hypertensive chronic kidney disease with stage 1 through stage 4 chronic kidney disease, or unspecified chronic kidney disease: Secondary | ICD-10-CM | POA: Diagnosis not present

## 2020-08-15 DIAGNOSIS — N1832 Chronic kidney disease, stage 3b: Secondary | ICD-10-CM | POA: Diagnosis not present

## 2020-08-15 DIAGNOSIS — Q6102 Congenital multiple renal cysts: Secondary | ICD-10-CM | POA: Diagnosis not present

## 2020-08-15 DIAGNOSIS — Q619 Cystic kidney disease, unspecified: Secondary | ICD-10-CM | POA: Diagnosis not present

## 2020-09-05 ENCOUNTER — Other Ambulatory Visit: Payer: Self-pay | Admitting: Family

## 2020-09-05 DIAGNOSIS — D472 Monoclonal gammopathy: Secondary | ICD-10-CM

## 2020-09-05 DIAGNOSIS — N183 Chronic kidney disease, stage 3 unspecified: Secondary | ICD-10-CM

## 2020-09-06 ENCOUNTER — Inpatient Hospital Stay: Payer: Medicare Other | Attending: Hematology & Oncology

## 2020-09-06 ENCOUNTER — Other Ambulatory Visit: Payer: Self-pay

## 2020-09-06 ENCOUNTER — Inpatient Hospital Stay (HOSPITAL_BASED_OUTPATIENT_CLINIC_OR_DEPARTMENT_OTHER): Payer: Medicare Other | Admitting: Family

## 2020-09-06 ENCOUNTER — Encounter: Payer: Self-pay | Admitting: Family

## 2020-09-06 VITALS — BP 126/61 | HR 71 | Temp 98.3°F | Ht 73.0 in | Wt 197.8 lb

## 2020-09-06 DIAGNOSIS — G8929 Other chronic pain: Secondary | ICD-10-CM | POA: Insufficient documentation

## 2020-09-06 DIAGNOSIS — D472 Monoclonal gammopathy: Secondary | ICD-10-CM | POA: Insufficient documentation

## 2020-09-06 DIAGNOSIS — M542 Cervicalgia: Secondary | ICD-10-CM | POA: Diagnosis not present

## 2020-09-06 DIAGNOSIS — D509 Iron deficiency anemia, unspecified: Secondary | ICD-10-CM | POA: Diagnosis not present

## 2020-09-06 DIAGNOSIS — N183 Chronic kidney disease, stage 3 unspecified: Secondary | ICD-10-CM

## 2020-09-06 LAB — CMP (CANCER CENTER ONLY)
ALT: 9 U/L (ref 0–44)
AST: 11 U/L — ABNORMAL LOW (ref 15–41)
Albumin: 3.7 g/dL (ref 3.5–5.0)
Alkaline Phosphatase: 88 U/L (ref 38–126)
Anion gap: 4 — ABNORMAL LOW (ref 5–15)
BUN: 26 mg/dL — ABNORMAL HIGH (ref 8–23)
CO2: 31 mmol/L (ref 22–32)
Calcium: 9.4 mg/dL (ref 8.9–10.3)
Chloride: 104 mmol/L (ref 98–111)
Creatinine: 1.68 mg/dL — ABNORMAL HIGH (ref 0.61–1.24)
GFR, Estimated: 40 mL/min — ABNORMAL LOW (ref >60)
Glucose, Bld: 86 mg/dL (ref 70–99)
Potassium: 4.7 mmol/L (ref 3.5–5.1)
Sodium: 139 mmol/L (ref 135–145)
Total Bilirubin: 0.5 mg/dL (ref 0.3–1.2)
Total Protein: 6.8 g/dL (ref 6.5–8.1)

## 2020-09-06 LAB — CBC WITH DIFFERENTIAL (CANCER CENTER ONLY)
Abs Immature Granulocytes: 0.01 10*3/uL (ref 0.00–0.07)
Basophils Absolute: 0 10*3/uL (ref 0.0–0.1)
Basophils Relative: 1 %
Eosinophils Absolute: 0.1 10*3/uL (ref 0.0–0.5)
Eosinophils Relative: 2 %
HCT: 42.1 % (ref 39.0–52.0)
Hemoglobin: 13.4 g/dL (ref 13.0–17.0)
Immature Granulocytes: 0 %
Lymphocytes Relative: 25 %
Lymphs Abs: 1.7 10*3/uL (ref 0.7–4.0)
MCH: 30.9 pg (ref 26.0–34.0)
MCHC: 31.8 g/dL (ref 30.0–36.0)
MCV: 97.2 fL (ref 80.0–100.0)
Monocytes Absolute: 0.7 10*3/uL (ref 0.1–1.0)
Monocytes Relative: 10 %
Neutro Abs: 4.1 10*3/uL (ref 1.7–7.7)
Neutrophils Relative %: 62 %
Platelet Count: 179 10*3/uL (ref 150–400)
RBC: 4.33 MIL/uL (ref 4.22–5.81)
RDW: 12.9 % (ref 11.5–15.5)
WBC Count: 6.6 10*3/uL (ref 4.0–10.5)
nRBC: 0 % (ref 0.0–0.2)

## 2020-09-06 NOTE — Progress Notes (Signed)
Hematology and Oncology Follow Up Visit  Alex Dunn [id number] 08-16-1936 84 y.o. 09/06/2020   Principle Diagnosis:  IgA kappa MGUS  Current Therapy:   Observation   Interim History:  Alex Dunn is here today with his wife for follow-up. He is still having a lot of weakness and fatigue.  He states that after receiving B 12 injections several months ago he did feel a little better. He is currently on an oral supplement.  He does have history of iron deficiency as well.  He has not noted any episodes of blood loss. No abnormal bruising, no petechiae.  He has urinary frequency at night. He also notse feeling thirsty much of the time.  He has occasional random episodes of chills and body aches at night for the last couple of years.  No night sweats.  No fever, n/v, cough, rash, SOB, chest pain, palpitations or changes in bowel or bladder habits.  He takes stool softeners as needed. He has history of diverticulitis and has intermittent abdominal pain that seems to worsen when he has a bowel movement.  He has chronic back pain and states that the two steroid injections her received 6 months ago did help at that time.  He has a good appetite and is staying well hydrated. His weight is stable at 197 lbs.   ECOG Performance Status: 1 - Symptomatic but completely ambulatory  Medications:  Allergies as of 09/06/2020      Reactions   Codeine Phosphate Other (See Comments)   Seen odd things crawling up wall   Pregabalin Other (See Comments)   Drowsy, nightmares, stumbling   Amitriptyline Other (See Comments)   SLEPT FOR 18-19 HOURS STRAIGHT   Lexapro [escitalopram Oxalate] Other (See Comments)   shaking      Medication List       Accurate as of September 06, 2020 10:48 AM. If you have any questions, ask your nurse or doctor.        baclofen 10 MG tablet Commonly known as: LIORESAL Take 1 tablet (10 mg total) by mouth 3 (three) times daily.   cyanocobalamin 1000 MCG  tablet Take by mouth.   ICAPS AREDS 2 PO Take 1 each by mouth daily.   losartan 100 MG tablet Commonly known as: COZAAR TAKE 1 TABLET(100 MG) BY MOUTH DAILY   pantoprazole 40 MG tablet Commonly known as: PROTONIX Take 1 tablet (40 mg total) by mouth daily as needed.   traMADol 50 MG tablet Commonly known as: ULTRAM Take 1 tablet (50 mg total) by mouth every 12 (twelve) hours as needed for moderate pain.   TYLENOL PO Take by mouth as needed.   VITAMIN D PO Take 2,000 Int'l Units by mouth daily.       Allergies:  Allergies  Allergen Reactions  . Codeine Phosphate Other (See Comments)    Seen odd things crawling up wall  . Pregabalin Other (See Comments)    Drowsy, nightmares, stumbling  . Amitriptyline Other (See Comments)    SLEPT FOR 18-19 HOURS STRAIGHT  . Lexapro [Escitalopram Oxalate] Other (See Comments)    shaking    Past Medical History, Surgical history, Social history, and Family History were reviewed and updated.  Review of Systems: All other 10 point review of systems is negative.   Physical Exam:  height is 6\' 1"  (1.854 m) and weight is 197 lb 12.8 oz (89.7 kg). His oral temperature is 98.3 F (36.8 C). His blood pressure is 126/61 and his pulse  is 71. His oxygen saturation is 100%.   Wt Readings from Last 3 Encounters:  09/06/20 197 lb 12.8 oz (89.7 kg)  05/31/20 191 lb (86.6 kg)  04/27/20 200 lb (90.7 kg)    Ocular: Sclerae unicteric, pupils equal, round and reactive to light Ear-nose-throat: Oropharynx clear, dentition fair Lymphatic: No cervical, supraclavicular or axillary adenopathy Lungs no rales or rhonchi, good excursion bilaterally Heart regular rate and rhythm, no murmur appreciated Abd soft, nontender, positive bowel sounds, no liver or spleen tip palpated on exam, no fluid wave  MSK no focal spinal tenderness, no joint edema Neuro: non-focal, well-oriented, appropriate affect Breasts: Deferred   Lab Results  Component Value  Date   WBC 6.6 09/06/2020   HGB 13.4 09/06/2020   HCT 42.1 09/06/2020   MCV 97.2 09/06/2020   PLT 179 09/06/2020   No results found for: FERRITIN, IRON, TIBC, UIBC, IRONPCTSAT Lab Results  Component Value Date   RETICCTPCT 0.8 08/12/2018   RBC 4.33 09/06/2020   Lab Results  Component Value Date   KPAFRELGTCHN 55.2 (H) 03/07/2020   LAMBDASER 25.6 03/07/2020   KAPLAMBRATIO 2.16 (H) 03/07/2020   Lab Results  Component Value Date   IGGSERUM 932 03/07/2020   IGA 532 (H) 03/07/2020   IGMSERUM 50 03/07/2020   Lab Results  Component Value Date   TOTALPROTELP 6.6 03/07/2020   ALBUMINELP 3.5 10/06/2018   A1GS 0.2 10/06/2018   A2GS 0.7 10/06/2018   BETS 1.3 10/06/2018   GAMS 0.8 10/06/2018   MSPIKE Not Observed 10/06/2018   SPEI Comment 10/06/2018     Chemistry      Component Value Date/Time   NA 138 04/27/2020 1750   NA 142 07/01/2019 1154   K 4.5 04/27/2020 1750   CL 102 04/27/2020 1750   CO2 25 04/27/2020 1750   BUN 22 04/27/2020 1750   BUN 25 07/01/2019 1154   CREATININE 1.55 (H) 04/27/2020 1750   CREATININE 1.83 (H) 03/07/2020 1250   CREATININE 1.61 (H) 02/26/2019 1511      Component Value Date/Time   CALCIUM 8.9 04/27/2020 1750   ALKPHOS 87 03/07/2020 1250   AST 11 (L) 03/07/2020 1250   ALT 9 03/07/2020 1250   BILITOT 0.5 03/07/2020 1250       Impression and Plan: Alex Dunn is a very pleasant 84 yo caucasian gentleman with IgA kappa MGUS. His counts in the past have remained stable.  Proteins studies were drawn today and results are pending.  We also added iron studies since he is symptomatic and has history of IDA.  He will also follow-up with his PCP regarding his B 12.  Follow-up in 6 months.  They were encouraged to contact our office with any questions or concerns.   Laverna Peace, NP 11/3/202110:48 AM

## 2020-09-07 ENCOUNTER — Other Ambulatory Visit: Payer: Medicare Other

## 2020-09-07 ENCOUNTER — Other Ambulatory Visit: Payer: Self-pay

## 2020-09-07 ENCOUNTER — Telehealth: Payer: Self-pay | Admitting: Family

## 2020-09-07 ENCOUNTER — Ambulatory Visit: Payer: Medicare Other | Admitting: Family

## 2020-09-07 ENCOUNTER — Ambulatory Visit (INDEPENDENT_AMBULATORY_CARE_PROVIDER_SITE_OTHER): Payer: Medicare Other

## 2020-09-07 DIAGNOSIS — Z23 Encounter for immunization: Secondary | ICD-10-CM

## 2020-09-07 LAB — IGG, IGA, IGM
IgA: 539 mg/dL — ABNORMAL HIGH (ref 61–437)
IgG (Immunoglobin G), Serum: 1032 mg/dL (ref 603–1613)
IgM (Immunoglobulin M), Srm: 58 mg/dL (ref 15–143)

## 2020-09-07 LAB — PROTEIN ELECTROPHORESIS, SERUM
A/G Ratio: 1.1 (ref 0.7–1.7)
Albumin ELP: 3.3 g/dL (ref 2.9–4.4)
Alpha-1-Globulin: 0.2 g/dL (ref 0.0–0.4)
Alpha-2-Globulin: 0.7 g/dL (ref 0.4–1.0)
Beta Globulin: 1.4 g/dL — ABNORMAL HIGH (ref 0.7–1.3)
Gamma Globulin: 0.8 g/dL (ref 0.4–1.8)
Globulin, Total: 3.1 g/dL (ref 2.2–3.9)
M-Spike, %: 0.2 g/dL — ABNORMAL HIGH
Total Protein ELP: 6.4 g/dL (ref 6.0–8.5)

## 2020-09-07 LAB — IRON AND TIBC
Iron: 81 ug/dL (ref 42–163)
Saturation Ratios: 28 % (ref 20–55)
TIBC: 286 ug/dL (ref 202–409)
UIBC: 205 ug/dL (ref 117–376)

## 2020-09-07 LAB — KAPPA/LAMBDA LIGHT CHAINS
Kappa free light chain: 66.6 mg/L — ABNORMAL HIGH (ref 3.3–19.4)
Kappa, lambda light chain ratio: 2.97 — ABNORMAL HIGH (ref 0.26–1.65)
Lambda free light chains: 22.4 mg/L (ref 5.7–26.3)

## 2020-09-07 LAB — FERRITIN: Ferritin: 90 ng/mL (ref 24–336)

## 2020-09-07 NOTE — Telephone Encounter (Signed)
Appointments scheduled calendar printed & mailed per 11/3 los 

## 2020-09-11 ENCOUNTER — Encounter: Payer: Self-pay | Admitting: *Deleted

## 2020-10-06 ENCOUNTER — Other Ambulatory Visit: Payer: Self-pay | Admitting: Family Medicine

## 2020-10-06 DIAGNOSIS — G8929 Other chronic pain: Secondary | ICD-10-CM

## 2020-10-09 NOTE — Telephone Encounter (Signed)
Last OV---02/01/2020 Last RF---05/31/2020

## 2020-11-08 DIAGNOSIS — X32XXXA Exposure to sunlight, initial encounter: Secondary | ICD-10-CM | POA: Diagnosis not present

## 2020-11-08 DIAGNOSIS — B353 Tinea pedis: Secondary | ICD-10-CM | POA: Diagnosis not present

## 2020-11-08 DIAGNOSIS — L570 Actinic keratosis: Secondary | ICD-10-CM | POA: Diagnosis not present

## 2020-11-08 DIAGNOSIS — L820 Inflamed seborrheic keratosis: Secondary | ICD-10-CM | POA: Diagnosis not present

## 2020-11-08 DIAGNOSIS — N1832 Chronic kidney disease, stage 3b: Secondary | ICD-10-CM | POA: Diagnosis not present

## 2020-11-13 DIAGNOSIS — I10 Essential (primary) hypertension: Secondary | ICD-10-CM | POA: Insufficient documentation

## 2020-11-15 ENCOUNTER — Other Ambulatory Visit (INDEPENDENT_AMBULATORY_CARE_PROVIDER_SITE_OTHER): Payer: Medicare Other

## 2020-11-15 ENCOUNTER — Ambulatory Visit: Payer: Medicare Other | Admitting: Cardiology

## 2020-11-15 ENCOUNTER — Other Ambulatory Visit: Payer: Self-pay

## 2020-11-15 ENCOUNTER — Encounter: Payer: Self-pay | Admitting: Cardiology

## 2020-11-15 VITALS — BP 152/60 | HR 91 | Ht 72.0 in | Wt 201.0 lb

## 2020-11-15 DIAGNOSIS — I4729 Other ventricular tachycardia: Secondary | ICD-10-CM

## 2020-11-15 DIAGNOSIS — I472 Ventricular tachycardia: Secondary | ICD-10-CM | POA: Diagnosis not present

## 2020-11-15 DIAGNOSIS — R61 Generalized hyperhidrosis: Secondary | ICD-10-CM | POA: Diagnosis not present

## 2020-11-15 DIAGNOSIS — I10 Essential (primary) hypertension: Secondary | ICD-10-CM | POA: Diagnosis not present

## 2020-11-15 DIAGNOSIS — N1832 Chronic kidney disease, stage 3b: Secondary | ICD-10-CM | POA: Diagnosis not present

## 2020-11-15 DIAGNOSIS — I129 Hypertensive chronic kidney disease with stage 1 through stage 4 chronic kidney disease, or unspecified chronic kidney disease: Secondary | ICD-10-CM | POA: Diagnosis not present

## 2020-11-15 DIAGNOSIS — Q6102 Congenital multiple renal cysts: Secondary | ICD-10-CM | POA: Diagnosis not present

## 2020-11-15 DIAGNOSIS — R002 Palpitations: Secondary | ICD-10-CM

## 2020-11-15 DIAGNOSIS — I700 Atherosclerosis of aorta: Secondary | ICD-10-CM

## 2020-11-15 NOTE — Patient Instructions (Signed)
Medication Instructions:  Your physician recommends that you continue on your current medications as directed. Please refer to the Current Medication list given to you today.  *If you need a refill on your cardiac medications before your next appointment, please call your pharmacy*  Lab Work: You will have labs drawn today: Lipid panel  Testing/Procedures: A zio monitor was ordered today. It will remain on for 7 days. You will then return monitor and event diary in provided box. It takes 1-2 weeks for report to be downloaded and returned to [place]. We will call you with the results. If monitor falls off or has orange flashing light, please call [employer] for further instructions.   Follow-Up: At [facility], you and your health needs are our priority.  As part of our continuing mission to provide you with exceptional heart care, we have created designated Provider Care Teams.  These Care Teams include your primary Cardiologist (physician) and Advanced Practice Providers (APPs -  Physician Assistants and Nurse Practitioners) who all work together to provide you with the care you need, when you need it.  Your next appointment:   2 month(s)  The format for your next appointment:   In Person  Provider:   Jenne Campus, MD

## 2020-11-15 NOTE — Progress Notes (Signed)
g

## 2020-11-15 NOTE — Progress Notes (Signed)
Cardiology Office Note:    Date:  11/15/2020   ID:  Alex Dunn, DOB August 07, 1936, MRN [id number]  PCP:  Shelda Pal, DO  Cardiologist:  Jenne Campus, MD    Referring MD: Shelda Pal*   Chief Complaint  Patient presents with  . Follow-up  I still have night sweats  History of Present Illness:    Alex Dunn is a 85 y.o. male with past medical history significant for essential hypertension, however 3 years he is experiencing profound weakness fatigue and tiredness on top of that sometimes he wakes up in the middle of the night with profound sweating.  Quite extensive evaluation has been done which included monitor which showed some nonsustained ventricular tachycardia.  After that he had a stress test done as well as echocardiogram both were encouraging and overall conclusion was that he is tachyarrhythmia is not dangerous.  He comes today 2 months of follow-up he still complain of being weakness fatigue as well as night sweats every 3 to 4 weeks.  Today's EKG shows ventricular bigeminy and origin of PVCs from left ventricle probably somewhere towards the base.  Previously he did have nonsustained ventricular tachycardia which being aggressively investigated.  His blood pressure also is elevated today  Past Medical History:  Diagnosis Date  . Abdominal aortic atherosclerosis ([place]) 04/15/2018  . ANEMIA-IRON DEFICIENCY 06/23/2007  . Ataxia 02/14/2020  . B12 deficiency 06/13/2020  . Balance problem 01/10/2020  . BENIGN PROSTATIC HYPERTROPHY 06/23/2007  . Bilateral leg pain 02/14/2020  . BPH (benign prostatic hyperplasia) 06/23/2007   Qualifier: Diagnosis of  By: Paulina Fusi, RN, Daine Gravel   Formatting of this note might be different from the original. Formatting of this note might be different from the original. Qualifier: Diagnosis of  By: Paulina Fusi, RN, Daine Gravel  . Chronic kidney disease 10/18/2016  . Degeneration of lumbar intervertebral disc 06/30/2018  . Diverticular  disease 04/15/2018  . Diverticulosis   . Elevated diaphragm 04/15/2018  . Essential hypertension 04/15/2018  . Gastro-esophageal reflux disease without esophagitis 06/23/2007   Qualifier: Diagnosis of  By: Paulina Fusi, RN, Daine Gravel   Formatting of this note might be different from the original. Formatting of this note might be different from the original. Qualifier: Diagnosis of  By: Paulina Fusi, RN, Daine Gravel  . GERD 06/23/2007  . Hypertension   . Low back pain 03/09/2019  . Lumbar radiculopathy 05/06/2019  . Lumbosacral spondylosis without myelopathy 03/10/2019  . MGUS (monoclonal gammopathy of unknown significance)   . Monoclonal gammopathy of unknown significance (MGUS) 08/24/2018  . Multiple renal cysts 04/06/2018  . Night sweats 03/13/2018   Formatting of this note might be different from the original. Last Assessment & Plan:  Formatting of this note might be different from the original. It appears as if these have resolved now but were part of the constellation of presenting symptoms. Could reflect infectious, metabolic/endocrine, autoimmune process contributing.  . Nonsustained ventricular tachycardia ([place]) 11/03/2019  . Polycystic kidney disease    stage 3 per wife  . Primary osteoarthritis of both knees 06/30/2018   Bilateral moderate with moderate chondromalacia patella Formatting of this note might be different from the original. Formatting of this note might be different from the original. Bilateral moderate with moderate chondromalacia patella  . Stage 3b chronic kidney disease ([place]) 06/21/2020  . Weakness 03/13/2018   Formatting of this note might be different from the original. Last Assessment & Plan:  Formatting of this note might be different from the  original. Alex Dunn has had a fairly extensive work up regarding his concerning and sudden symptoms. He and his wife are frustrated and very concerned they will not identify what is going on to explain. We spent a lot of time reviewing all consults  and l  . Weakness of both lower extremities 01/10/2020    Past Surgical History:  Procedure Laterality Date  . HAND SURGERY    . LUNG SURGERY     for collapsed lung    Current Medications: Current Meds  Medication Sig  . Acetaminophen (TYLENOL PO) Take by mouth as needed.  . cyanocobalamin 1000 MCG tablet Take by mouth.  . losartan (COZAAR) 100 MG tablet TAKE 1 TABLET(100 MG) BY MOUTH DAILY  . Multiple Vitamins-Minerals (ICAPS AREDS 2 PO) Take 1 each by mouth daily.  . pantoprazole (PROTONIX) 40 MG tablet Take 1 tablet (40 mg total) by mouth daily as needed.  . traMADol (ULTRAM) 50 MG tablet TAKE 1 TABLET(50 MG) BY MOUTH EVERY 12 HOURS AS NEEDED FOR MODERATE PAIN  . VITAMIN D PO Take 2,000 Int'l Units by mouth daily.     Allergies:   Codeine phosphate, Pregabalin, Amitriptyline, and Lexapro [escitalopram oxalate]   Social History   Socioeconomic History  . Marital status: Married    Spouse name: Not on file  . Number of children: 1  . Years of education: Not on file  . Highest education level: High school graduate  Occupational History  . Not on file  Tobacco Use  . Smoking status: Never Smoker  . Smokeless tobacco: Never Used  Vaping Use  . Vaping Use: Never used  Substance and Sexual Activity  . Alcohol use: Never  . Drug use: Never  . Sexual activity: Not on file  Other Topics Concern  . Not on file  Social History Narrative   Lives at home with his wife   Retired   Right handed   Caffeine: about 2 cups daily   Social Determinants of Health   Financial Resource Strain: Not on file  Food Insecurity: Not on file  Transportation Needs: Not on file  Physical Activity: Not on file  Stress: Not on file  Social Connections: Not on file     Family History: The patient's family history includes Heart attack in his brother, father, and mother. There is no history of Neuromuscular disorder. ROS:   Please see the history of present illness.    All 14 point  review of systems negative except as described per history of present illness  EKGs/Labs/Other Studies Reviewed:      Recent Labs: 12/14/2019: Pro B Natriuretic peptide (BNP) 54.0 09/06/2020: ALT 9; BUN 26; Creatinine 1.68; Hemoglobin 13.4; Platelet Count 179; Potassium 4.7; Sodium 139  Recent Lipid Panel    Component Value Date/Time   CHOL 196 04/21/2019 1104   TRIG 155.0 (H) 04/21/2019 1104   HDL 38.00 (L) 04/21/2019 1104   CHOLHDL 5 04/21/2019 1104   VLDL 31.0 04/21/2019 1104   LDLCALC 127 (H) 04/21/2019 1104    Physical Exam:    VS:  BP (!) 152/60 (BP Location: Right Arm, Patient Position: Sitting)   Pulse 91   Ht 6' (1.829 m)   Wt 201 lb (91.2 kg)   SpO2 95%   BMI 27.26 kg/m     Wt Readings from Last 3 Encounters:  11/15/20 201 lb (91.2 kg)  09/06/20 197 lb 12.8 oz (89.7 kg)  05/31/20 191 lb (86.6 kg)     GEN:  Well nourished, well developed in no acute distress HEENT: Normal NECK: No JVD; No carotid bruits LYMPHATICS: No lymphadenopathy CARDIAC: RRR, no murmurs, no rubs, no gallops RESPIRATORY:  Clear to auscultation without rales, wheezing or rhonchi  ABDOMEN: Soft, non-tender, non-distended MUSCULOSKELETAL:  No edema; No deformity  SKIN: Warm and dry LOWER EXTREMITIES: no swelling NEUROLOGIC:  Alert and oriented x 3 PSYCHIATRIC:  Normal affect   ASSESSMENT:    1. Nonsustained ventricular tachycardia (H[facility])   2. Essential hypertension   3. Abdominal aortic atherosclerosis ([place])   4. Night sweats    PLAN:    In order of problems listed above:  1. Nonsustained ventricular tachycardia we will ask him to wear monitor again to see how much of extrasystole he has.  In the future we may consider treating this to see if it make his symptomatology any better.  Probably the most benign approach will be to do beta-blocker if not we may go to pure antiarrhythmic.  He may also require referral to EP team. 2. Essential hypertension, still uncontrolled.  Anticipate  need to start beta-blocker for his arrhythmia therefore I will wait until await results of Zio patch. 3. Aortic aneurysm stable. 4. Night sweats no clear-cut explanation for it. 5. Dyslipidemia: We will check his fasting lipid profile today   Medication Adjustments/Labs and Tests Ordered: Current medicines are reviewed at length with the patient today.  Concerns regarding medicines are outlined above.  No orders of the defined types were placed in this encounter.  Medication changes: No orders of the defined types were placed in this encounter.   Signed, Park Liter, MD, [facility] 11/15/2020 1:47 PM    [facility]

## 2020-11-16 LAB — LIPID PANEL
Chol/HDL Ratio: 4.6 ratio (ref 0.0–5.0)
Cholesterol, Total: 169 mg/dL (ref 100–199)
HDL: 37 mg/dL — ABNORMAL LOW (ref >39)
LDL Chol Calc (NIH): 113 mg/dL — ABNORMAL HIGH (ref 0–99)
Triglycerides: 103 mg/dL (ref 0–149)
VLDL Cholesterol Cal: 19 mg/dL (ref 5–40)

## 2020-11-17 ENCOUNTER — Telehealth: Payer: Self-pay

## 2020-11-17 NOTE — Telephone Encounter (Signed)
Left a message to return my call.

## 2020-11-17 NOTE — Telephone Encounter (Signed)
-----   Message from Park Liter, MD sent at 11/17/2020  9:04 AM EST ----- Cholesterol mildly elevated.  For now continue diet and exercise will wait for work-up to be completed before deciding about therapy.

## 2020-11-21 NOTE — Telephone Encounter (Signed)
Patient returning call.

## 2020-11-22 DIAGNOSIS — I472 Ventricular tachycardia: Secondary | ICD-10-CM

## 2020-11-22 DIAGNOSIS — R002 Palpitations: Secondary | ICD-10-CM

## 2020-11-23 ENCOUNTER — Telehealth: Payer: Self-pay

## 2020-11-23 NOTE — Telephone Encounter (Signed)
Patient notified of results and verbalized understanding.  

## 2020-11-23 NOTE — Telephone Encounter (Signed)
-----   Message from Robert J Krasowski, MD sent at 11/17/2020  9:04 AM EST ----- Cholesterol mildly elevated.  For now continue diet and exercise will wait for work-up to be completed before deciding about therapy. 

## 2020-11-30 DIAGNOSIS — R002 Palpitations: Secondary | ICD-10-CM | POA: Diagnosis not present

## 2020-11-30 DIAGNOSIS — I472 Ventricular tachycardia: Secondary | ICD-10-CM | POA: Diagnosis not present

## 2020-12-06 ENCOUNTER — Other Ambulatory Visit: Payer: Self-pay

## 2020-12-06 ENCOUNTER — Ambulatory Visit (INDEPENDENT_AMBULATORY_CARE_PROVIDER_SITE_OTHER): Payer: Medicare Other | Admitting: Family Medicine

## 2020-12-06 ENCOUNTER — Encounter: Payer: Self-pay | Admitting: Family Medicine

## 2020-12-06 VITALS — BP 140/82 | HR 74 | Temp 98.5°F | Ht 72.0 in | Wt 195.1 lb

## 2020-12-06 DIAGNOSIS — E538 Deficiency of other specified B group vitamins: Secondary | ICD-10-CM

## 2020-12-06 DIAGNOSIS — M545 Low back pain, unspecified: Secondary | ICD-10-CM

## 2020-12-06 LAB — VITAMIN B12: Vitamin B-12: >1506 pg/mL — ABNORMAL HIGH (ref 211–911)

## 2020-12-06 MED ORDER — CYANOCOBALAMIN 1000 MCG/ML IJ SOLN
1000.0000 ug | Freq: Once | INTRAMUSCULAR | Status: AC
  Administered 2020-12-06: 1000 ug via INTRAMUSCULAR

## 2020-12-06 NOTE — Addendum Note (Signed)
Addended by: Sharon Seller B on: 12/06/2020 10:56 AM   Modules accepted: Orders

## 2020-12-06 NOTE — Progress Notes (Signed)
Chief Complaint  Patient presents with  . Fatigue    Vertigo     Subjective: Patient is a 86 y.o. male here for f/u. Here w wife, Dot.  Over past 2 weeks, pt has had recurrence of fatigue, low back pain, and weakness. This happened in the past and he had B12 injections at [facility]. This had resolved his issue. He reports compliance with PO B12 tabs. Does not drink much water. Tries to stay active. Tramadol helps w pain, but not as much lately.   Past Medical History:  Diagnosis Date  . Abdominal aortic atherosclerosis ([place]) 04/15/2018  . ANEMIA-IRON DEFICIENCY 06/23/2007  . Ataxia 02/14/2020  . B12 deficiency 06/13/2020  . Balance problem 01/10/2020  . BENIGN PROSTATIC HYPERTROPHY 06/23/2007  . Bilateral leg pain 02/14/2020  . BPH (benign prostatic hyperplasia) 06/23/2007   Qualifier: Diagnosis of  By: Paulina Fusi, RN, Daine Gravel   Formatting of this note might be different from the original. Formatting of this note might be different from the original. Qualifier: Diagnosis of  By: Paulina Fusi, RN, Daine Gravel  . Chronic kidney disease 10/18/2016  . Degeneration of lumbar intervertebral disc 06/30/2018  . Diverticular disease 04/15/2018  . Diverticulosis   . Elevated diaphragm 04/15/2018  . Essential hypertension 04/15/2018  . Gastro-esophageal reflux disease without esophagitis 06/23/2007   Qualifier: Diagnosis of  By: Paulina Fusi, RN, Daine Gravel   Formatting of this note might be different from the original. Formatting of this note might be different from the original. Qualifier: Diagnosis of  By: Paulina Fusi, RN, Daine Gravel  . GERD 06/23/2007  . Hypertension   . Low back pain 03/09/2019  . Lumbar radiculopathy 05/06/2019  . Lumbosacral spondylosis without myelopathy 03/10/2019  . MGUS (monoclonal gammopathy of unknown significance)   . Monoclonal gammopathy of unknown significance (MGUS) 08/24/2018  . Multiple renal cysts 04/06/2018  . Night sweats 03/13/2018   Formatting of this note might be different from the original. Last  Assessment & Plan:  Formatting of this note might be different from the original. It appears as if these have resolved now but were part of the constellation of presenting symptoms. Could reflect infectious, metabolic/endocrine, autoimmune process contributing.  . Nonsustained ventricular tachycardia ([place]) 11/03/2019  . Polycystic kidney disease    stage 3 per wife  . Primary osteoarthritis of both knees 06/30/2018   Bilateral moderate with moderate chondromalacia patella Formatting of this note might be different from the original. Formatting of this note might be different from the original. Bilateral moderate with moderate chondromalacia patella  . Stage 3b chronic kidney disease ([place]) 06/21/2020  . Weakness 03/13/2018   Formatting of this note might be different from the original. Last Assessment & Plan:  Formatting of this note might be different from the original. [name] has had a fairly extensive work up regarding his concerning and sudden symptoms. He and his wife are frustrated and very concerned they will not identify what is going on to explain. We spent a lot of time reviewing all consults and l  . Weakness of both lower extremities 01/10/2020    Objective: BP 140/82 (BP Location: Left Arm, Patient Position: Sitting, Cuff Size: Normal)   Pulse 74   Temp 98.5 F (36.9 C) (Oral)   Ht 6' (1.829 m)   Wt 195 lb 2 oz (88.5 kg)   SpO2 99%   BMI 26.46 kg/m  General: Awake, appears stated age Heart: RRR, no LE edema MSK: No ttp in lumbar region Neuro: Gait  is slow Lungs: CTAB, no rales, wheezes or rhonchi. No accessory muscle use Psych: Age appropriate judgment and insight, normal affect and mood  Assessment and Plan: B12 deficiency - Plan: B12  Bilateral low back pain without sciatica, unspecified chronicity  Ck B12 today. Will give first injection today and another 2 injections q week then recheck. Hopefully he responds well.  F/u in 6 mo for CPE or prn.  The patient and  spouse voiced understanding and agreement to the plan.  Parke, DO 12/06/20  10:48 AM

## 2020-12-06 NOTE — Patient Instructions (Signed)
Give [place] 2-3 business days to get the results of your labs back.   You can stop taking the B12 tablets for now.  We will do 3 weeks of shots and then recheck.   We may do monthly shots after that.   Try to drink 55-60 oz of water daily outside of exercise.  Let us know if you need anything.

## 2020-12-12 ENCOUNTER — Telehealth: Payer: Self-pay | Admitting: Family Medicine

## 2020-12-12 NOTE — Telephone Encounter (Signed)
FYI Patient states he feels much better after B12 shot.

## 2020-12-14 ENCOUNTER — Other Ambulatory Visit: Payer: Self-pay

## 2020-12-14 ENCOUNTER — Ambulatory Visit (INDEPENDENT_AMBULATORY_CARE_PROVIDER_SITE_OTHER): Payer: Medicare Other

## 2020-12-14 DIAGNOSIS — E538 Deficiency of other specified B group vitamins: Secondary | ICD-10-CM

## 2020-12-14 MED ORDER — CYANOCOBALAMIN 1000 MCG/ML IJ SOLN
1000.0000 ug | Freq: Once | INTRAMUSCULAR | Status: AC
  Administered 2020-12-14: 1000 ug via INTRAMUSCULAR

## 2020-12-14 NOTE — Progress Notes (Signed)
Pt is here today for B12 injection. Pt was given B12 injection in left deltoid. Pt tolerated well.

## 2020-12-21 ENCOUNTER — Other Ambulatory Visit: Payer: Self-pay

## 2020-12-21 ENCOUNTER — Ambulatory Visit (INDEPENDENT_AMBULATORY_CARE_PROVIDER_SITE_OTHER): Payer: Medicare Other

## 2020-12-21 DIAGNOSIS — E538 Deficiency of other specified B group vitamins: Secondary | ICD-10-CM

## 2020-12-21 MED ORDER — CYANOCOBALAMIN 1000 MCG/ML IJ SOLN
1000.0000 ug | INTRAMUSCULAR | Status: DC
  Administered 2020-12-21: 1000 ug via INTRAMUSCULAR

## 2020-12-21 NOTE — Progress Notes (Signed)
Pt is here today for B12 injection. Pt was given B12 injection in left deltoid. Pt tolerated well.  Pt states he has scheduled a lab appointment for March 3rd to recheck B12. No orders in currently. FYI

## 2020-12-28 ENCOUNTER — Other Ambulatory Visit: Payer: Medicare Other

## 2021-01-02 DIAGNOSIS — M5416 Radiculopathy, lumbar region: Secondary | ICD-10-CM | POA: Diagnosis not present

## 2021-01-02 DIAGNOSIS — M5136 Other intervertebral disc degeneration, lumbar region: Secondary | ICD-10-CM | POA: Diagnosis not present

## 2021-01-03 ENCOUNTER — Other Ambulatory Visit: Payer: Self-pay | Admitting: Family Medicine

## 2021-01-03 DIAGNOSIS — E538 Deficiency of other specified B group vitamins: Secondary | ICD-10-CM

## 2021-01-04 ENCOUNTER — Other Ambulatory Visit: Payer: Self-pay

## 2021-01-04 ENCOUNTER — Other Ambulatory Visit (INDEPENDENT_AMBULATORY_CARE_PROVIDER_SITE_OTHER): Payer: Medicare Other

## 2021-01-04 DIAGNOSIS — E538 Deficiency of other specified B group vitamins: Secondary | ICD-10-CM | POA: Diagnosis not present

## 2021-01-05 LAB — VITAMIN B12: Vitamin B-12: 800 pg/mL (ref 211–911)

## 2021-01-09 ENCOUNTER — Other Ambulatory Visit: Payer: Self-pay | Admitting: Family Medicine

## 2021-01-09 DIAGNOSIS — I10 Essential (primary) hypertension: Secondary | ICD-10-CM

## 2021-01-12 ENCOUNTER — Other Ambulatory Visit: Payer: Self-pay

## 2021-01-12 DIAGNOSIS — K5790 Diverticulosis of intestine, part unspecified, without perforation or abscess without bleeding: Secondary | ICD-10-CM | POA: Insufficient documentation

## 2021-01-12 DIAGNOSIS — Q613 Polycystic kidney, unspecified: Secondary | ICD-10-CM | POA: Insufficient documentation

## 2021-01-12 DIAGNOSIS — D472 Monoclonal gammopathy: Secondary | ICD-10-CM | POA: Insufficient documentation

## 2021-01-17 ENCOUNTER — Other Ambulatory Visit: Payer: Self-pay

## 2021-01-17 ENCOUNTER — Encounter: Payer: Self-pay | Admitting: Cardiology

## 2021-01-17 ENCOUNTER — Ambulatory Visit: Payer: Medicare Other | Admitting: Cardiology

## 2021-01-17 VITALS — BP 140/60 | HR 72 | Ht 72.0 in | Wt 193.0 lb

## 2021-01-17 DIAGNOSIS — I472 Ventricular tachycardia, unspecified: Secondary | ICD-10-CM

## 2021-01-17 DIAGNOSIS — I10 Essential (primary) hypertension: Secondary | ICD-10-CM | POA: Diagnosis not present

## 2021-01-17 DIAGNOSIS — I4729 Other ventricular tachycardia: Secondary | ICD-10-CM

## 2021-01-17 DIAGNOSIS — I700 Atherosclerosis of aorta: Secondary | ICD-10-CM | POA: Diagnosis not present

## 2021-01-17 MED ORDER — METOPROLOL TARTRATE 25 MG PO TABS
12.5000 mg | ORAL_TABLET | Freq: Two times a day (BID) | ORAL | 3 refills | Status: DC
Start: 2021-01-17 — End: 2021-04-25

## 2021-01-17 NOTE — Progress Notes (Signed)
Cardiology Office Note:    Date:  01/17/2021   ID:  Alex Dunn, DOB Mar 24, 1936, MRN [id number]  PCP:  Shelda Pal, DO  Cardiologist:  Jenne Campus, MD    Referring MD: Shelda Pal*   Chief Complaint  Patient presents with  . Results  I still have episode of profound weakness and fatigue  History of Present Illness:    Alex Dunn is a 85 y.o. male   with past medical history significant for essential hypertension, however 3 years he is experiencing profound weakness fatigue and tiredness on top of that sometimes he wakes up in the middle of the night with profound sweating.  Quite extensive evaluation has been done which included monitor which showed some nonsustained ventricular tachycardia.  After that he had a stress test done as well as echocardiogram both were encouraging and overall conclusion was that he is tachyarrhythmia is not dangerous. Comes today 2 months of follow-up still have episode of this profound weakness and fatigue symptoms associated with palpitations.  Repeated monitor showed multiple episode of nonsustained ventricular tachycardia.  Denies having any passing out but does get dizzy sometimes.  Past Medical History:  Diagnosis Date  . Abdominal aortic atherosclerosis ([place]) 04/15/2018  . ANEMIA-IRON DEFICIENCY 06/23/2007  . Ataxia 02/14/2020  . B12 deficiency 06/13/2020  . Balance problem 01/10/2020  . BENIGN PROSTATIC HYPERTROPHY 06/23/2007  . Bilateral leg pain 02/14/2020  . BPH (benign prostatic hyperplasia) 06/23/2007   Qualifier: Diagnosis of  By: Paulina Fusi, RN, Daine Gravel   Formatting of this note might be different from the original. Formatting of this note might be different from the original. Qualifier: Diagnosis of  By: Paulina Fusi, RN, Daine Gravel  . Chronic kidney disease 10/18/2016  . Degeneration of lumbar intervertebral disc 06/30/2018  . Diverticular disease 04/15/2018  . Diverticulosis   . Elevated diaphragm 04/15/2018  .  Essential hypertension 04/15/2018  . Gastro-esophageal reflux disease without esophagitis 06/23/2007   Qualifier: Diagnosis of  By: Paulina Fusi, RN, Daine Gravel   Formatting of this note might be different from the original. Formatting of this note might be different from the original. Qualifier: Diagnosis of  By: Paulina Fusi, RN, Daine Gravel  . GERD 06/23/2007  . Hypertension   . Low back pain 03/09/2019  . Lumbar radiculopathy 05/06/2019  . Lumbosacral spondylosis without myelopathy 03/10/2019  . MGUS (monoclonal gammopathy of unknown significance)   . Monoclonal gammopathy of unknown significance (MGUS) 08/24/2018  . Multiple renal cysts 04/06/2018  . Night sweats 03/13/2018   Formatting of this note might be different from the original. Last Assessment & Plan:  Formatting of this note might be different from the original. It appears as if these have resolved now but were part of the constellation of presenting symptoms. Could reflect infectious, metabolic/endocrine, autoimmune process contributing.  . Nonsustained ventricular tachycardia ([place]) 11/03/2019  . Polycystic kidney disease    stage 3 per wife  . Primary osteoarthritis of both knees 06/30/2018   Bilateral moderate with moderate chondromalacia patella Formatting of this note might be different from the original. Formatting of this note might be different from the original. Bilateral moderate with moderate chondromalacia patella  . Stage 3b chronic kidney disease ([place]) 06/21/2020  . Weakness 03/13/2018   Formatting of this note might be different from the original. Last Assessment & Plan:  Formatting of this note might be different from the original. Alex Dunn has had a fairly extensive work up regarding his concerning and sudden symptoms.  He and his wife are frustrated and very concerned they will not identify what is going on to explain. We spent a lot of time reviewing all consults and l  . Weakness of both lower extremities 01/10/2020    Past Surgical  History:  Procedure Laterality Date  . HAND SURGERY    . LUNG SURGERY     for collapsed lung    Current Medications: Current Meds  Medication Sig  . Acetaminophen (TYLENOL PO) Take by mouth as needed (pain).  . cyanocobalamin 1000 MCG tablet Take by mouth.  . losartan (COZAAR) 100 MG tablet TAKE 1 TABLET(100 MG) BY MOUTH DAILY  . metoprolol tartrate (LOPRESSOR) 25 MG tablet Take 0.5 tablets (12.5 mg total) by mouth 2 (two) times daily.  . Multiple Vitamins-Minerals (ICAPS AREDS 2 PO) Take 1 each by mouth daily.  . pantoprazole (PROTONIX) 40 MG tablet Take 1 tablet (40 mg total) by mouth daily as needed.  . traMADol (ULTRAM) 50 MG tablet TAKE 1 TABLET(50 MG) BY MOUTH EVERY 12 HOURS AS NEEDED FOR MODERATE PAIN  . VITAMIN D PO Take 2,000 Int'l Units by mouth daily.   Current Facility-Administered Medications for the 01/17/21 encounter (Office Visit) with Park Liter, MD  Medication  . cyanocobalamin ((VITAMIN B-12)) injection 1,000 mcg     Allergies:   Codeine phosphate, Pregabalin, Amitriptyline, and Lexapro [escitalopram oxalate]   Social History   Socioeconomic History  . Marital status: Married    Spouse name: Not on file  . Number of children: 1  . Years of education: Not on file  . Highest education level: High school graduate  Occupational History  . Not on file  Tobacco Use  . Smoking status: Never Smoker  . Smokeless tobacco: Never Used  Vaping Use  . Vaping Use: Never used  Substance and Sexual Activity  . Alcohol use: Never  . Drug use: Never  . Sexual activity: Not on file  Other Topics Concern  . Not on file  Social History Narrative   Lives at home with his wife   Retired   Right handed   Caffeine: about 2 cups daily   Social Determinants of Health   Financial Resource Strain: Not on file  Food Insecurity: Not on file  Transportation Needs: Not on file  Physical Activity: Not on file  Stress: Not on file  Social Connections: Not on file      Family History: The patient's family history includes Heart attack in his brother, father, and mother. There is no history of Neuromuscular disorder. ROS:   Please see the history of present illness.    All 14 point review of systems negative except as described per history of present illness  EKGs/Labs/Other Studies Reviewed:      Recent Labs: 09/06/2020: ALT 9; BUN 26; Creatinine 1.68; Hemoglobin 13.4; Platelet Count 179; Potassium 4.7; Sodium 139  Recent Lipid Panel    Component Value Date/Time   CHOL 169 11/15/2020 1412   TRIG 103 11/15/2020 1412   HDL 37 (L) 11/15/2020 1412   CHOLHDL 4.6 11/15/2020 1412   CHOLHDL 5 04/21/2019 1104   VLDL 31.0 04/21/2019 1104   LDLCALC 113 (H) 11/15/2020 1412    Physical Exam:    VS:  BP 140/60 (BP Location: Right Arm, Patient Position: Sitting)   Pulse 72   Ht 6' (1.829 m)   Wt 193 lb (87.5 kg)   SpO2 94%   BMI 26.18 kg/m     Wt Readings from Last  3 Encounters:  01/17/21 193 lb (87.5 kg)  12/06/20 195 lb 2 oz (88.5 kg)  11/15/20 201 lb (91.2 kg)     GEN:  Well nourished, well developed in no acute distress HEENT: Normal NECK: No JVD; No carotid bruits LYMPHATICS: No lymphadenopathy CARDIAC: RRR, no murmurs, no rubs, no gallops RESPIRATORY:  Clear to auscultation without rales, wheezing or rhonchi  ABDOMEN: Soft, non-tender, non-distended MUSCULOSKELETAL:  No edema; No deformity  SKIN: Warm and dry LOWER EXTREMITIES: no swelling NEUROLOGIC:  Alert and oriented x 3 PSYCHIATRIC:  Normal affect   ASSESSMENT:    1. Ventricular tachycardia ([place])   2. Nonsustained ventricular tachycardia ([place])   3. Essential hypertension   4. Abdominal aortic atherosclerosis ([place])    PLAN:    In order of problems listed above:  1. Ventricular tachycardia.  I think reached the point that expertise of electrophysiology team as needed.  He will be referred to EP for evaluation I will repeat his echocardiogram to reassess left ventricle  ejection fraction, will initiate very small dose of metoprolol only 12.5 twice daily and I will see him back within next few months to see how he does. 2. Essential hypertension blood pressure seems to be well controlled continue present management. 3. Abdominal aorta sclerosis present noted risk factors modifications. 4. Aortic aneurysm: Stable   Medication Adjustments/Labs and Tests Ordered: Current medicines are reviewed at length with the patient today.  Concerns regarding medicines are outlined above.  Orders Placed This Encounter  Procedures  . Ambulatory referral to Cardiac Electrophysiology  . ECHOCARDIOGRAM COMPLETE   Medication changes:  Meds ordered this encounter  Medications  . metoprolol tartrate (LOPRESSOR) 25 MG tablet    Sig: Take 0.5 tablets (12.5 mg total) by mouth 2 (two) times daily.    Dispense:  90 tablet    Refill:  3    Signed, Park Liter, MD, [facility] 01/17/2021 4:39 PM    [facility]

## 2021-01-17 NOTE — Patient Instructions (Signed)
Medication Instructions:  Your physician has recommended you make the following change in your medication:  START: Metoprolol 12.5 mg twice daily *If you need a refill on your cardiac medications before your next appointment, please call your pharmacy*   Lab Work: None If you have labs (blood work) drawn today and your tests are completely normal, you will receive your results only by: Marland Kitchen MyChart Message (if you have MyChart) OR . A paper copy in the mail If you have any lab test that is abnormal or we need to change your treatment, we will call you to review the results.   Testing/Procedures: Your physician has requested that you have an echocardiogram. Echocardiography is a painless test that uses sound waves to create images of your heart. It provides your doctor with information about the size and shape of your heart and how well your heart's chambers and valves are working. This procedure takes approximately one hour. There are no restrictions for this procedure.   Follow-Up: At [facility], you and your health needs are our priority.  As part of our continuing mission to provide you with exceptional heart care, we have created designated Provider Care Teams.  These Care Teams include your primary Cardiologist (physician) and Advanced Practice Providers (APPs -  Physician Assistants and Nurse Practitioners) who all work together to provide you with the care you need, when you need it.  We recommend signing up for the patient portal called "MyChart".  Sign up information is provided on this After Visit Summary.  MyChart is used to connect with patients for Virtual Visits (Telemedicine).  Patients are able to view lab/test results, encounter notes, upcoming appointments, etc.  Non-urgent messages can be sent to your provider as well.   To learn more about what you can do with MyChart, go to [email or web address].    Your next appointment:   3 month(s)  The format for your next  appointment:   In Person  Provider:   Jenne Campus, MD   Other Instructions  Echocardiogram An echocardiogram is a test that uses sound waves (ultrasound) to produce images of the heart. Images from an echocardiogram can provide important information about:  Heart size and shape.  The size and thickness and movement of your heart's walls.  Heart muscle function and strength.  Heart valve function or if you have stenosis. Stenosis is when the heart valves are too narrow.  If blood is flowing backward through the heart valves (regurgitation).  A tumor or infectious growth around the heart valves.  Areas of heart muscle that are not working well because of poor blood flow or injury from a heart attack.  Aneurysm detection. An aneurysm is a weak or damaged part of an artery wall. The wall bulges out from the normal force of blood pumping through the body. Tell a health care provider about:  Any allergies you have.  All medicines you are taking, including vitamins, herbs, eye drops, creams, and over-the-counter medicines.  Any blood disorders you have.  Any surgeries you have had.  Any medical conditions you have.  Whether you are pregnant or may be pregnant. What are the risks? Generally, this is a safe test. However, problems may occur, including an allergic reaction to dye (contrast) that may be used during the test. What happens before the test? No specific preparation is needed. You may eat and drink normally. What happens during the test?  You will take off your clothes from the waist up and put on  a hospital gown.  Electrodes or electrocardiogram (ECG)patches may be placed on your chest. The electrodes or patches are then connected to a device that monitors your heart rate and rhythm.  You will lie down on a table for an ultrasound exam. A gel will be applied to your chest to help sound waves pass through your skin.  A handheld device, called a transducer,  will be pressed against your chest and moved over your heart. The transducer produces sound waves that travel to your heart and bounce back (or "echo" back) to the transducer. These sound waves will be captured in real-time and changed into images of your heart that can be viewed on a video monitor. The images will be recorded on a computer and reviewed by your health care provider.  You may be asked to change positions or hold your breath for a short time. This makes it easier to get different views or better views of your heart.  In some cases, you may receive contrast through an IV in one of your veins. This can improve the quality of the pictures from your heart. The procedure may vary among health care providers and hospitals.   What can I expect after the test? You may return to your normal, everyday life, including diet, activities, and medicines, unless your health care provider tells you not to do that. Follow these instructions at home:  It is up to you to get the results of your test. Ask your health care provider, or the department that is doing the test, when your results will be ready.  Keep all follow-up visits. This is important. Summary  An echocardiogram is a test that uses sound waves (ultrasound) to produce images of the heart.  Images from an echocardiogram can provide important information about the size and shape of your heart, heart muscle function, heart valve function, and other possible heart problems.  You do not need to do anything to prepare before this test. You may eat and drink normally.  After the echocardiogram is completed, you may return to your normal, everyday life, unless your health care provider tells you not to do that. This information is not intended to replace advice given to you by your health care provider. Make sure you discuss any questions you have with your health care provider. Document Revised: 06/13/2020 Document Reviewed:  06/13/2020 Elsevier Patient Education  2021 [employer].

## 2021-01-17 NOTE — Addendum Note (Signed)
Addended by: Darrel Reach on: 01/17/2021 04:55 PM   Modules accepted: Orders

## 2021-01-18 DIAGNOSIS — M5416 Radiculopathy, lumbar region: Secondary | ICD-10-CM | POA: Diagnosis not present

## 2021-01-18 DIAGNOSIS — M5136 Other intervertebral disc degeneration, lumbar region: Secondary | ICD-10-CM | POA: Diagnosis not present

## 2021-01-18 DIAGNOSIS — M48062 Spinal stenosis, lumbar region with neurogenic claudication: Secondary | ICD-10-CM | POA: Diagnosis not present

## 2021-02-13 ENCOUNTER — Other Ambulatory Visit: Payer: Self-pay

## 2021-02-13 ENCOUNTER — Ambulatory Visit (HOSPITAL_COMMUNITY): Payer: Medicare Other | Attending: Internal Medicine

## 2021-02-13 DIAGNOSIS — I472 Ventricular tachycardia, unspecified: Secondary | ICD-10-CM

## 2021-02-13 LAB — ECHOCARDIOGRAM COMPLETE
Area-P 1/2: 3.12 cm2
S' Lateral: 2.3 cm

## 2021-02-14 DIAGNOSIS — M5136 Other intervertebral disc degeneration, lumbar region: Secondary | ICD-10-CM | POA: Diagnosis not present

## 2021-02-14 DIAGNOSIS — M5416 Radiculopathy, lumbar region: Secondary | ICD-10-CM | POA: Diagnosis not present

## 2021-02-14 DIAGNOSIS — M48062 Spinal stenosis, lumbar region with neurogenic claudication: Secondary | ICD-10-CM | POA: Diagnosis not present

## 2021-02-15 DIAGNOSIS — N1832 Chronic kidney disease, stage 3b: Secondary | ICD-10-CM | POA: Diagnosis not present

## 2021-02-16 ENCOUNTER — Ambulatory Visit: Payer: Medicare Other | Admitting: Internal Medicine

## 2021-02-16 ENCOUNTER — Other Ambulatory Visit: Payer: Self-pay

## 2021-02-16 VITALS — BP 140/64 | HR 72 | Ht 72.0 in | Wt 190.0 lb

## 2021-02-16 DIAGNOSIS — I10 Essential (primary) hypertension: Secondary | ICD-10-CM | POA: Diagnosis not present

## 2021-02-16 DIAGNOSIS — I4729 Other ventricular tachycardia: Secondary | ICD-10-CM

## 2021-02-16 DIAGNOSIS — I472 Ventricular tachycardia: Secondary | ICD-10-CM

## 2021-02-16 MED ORDER — FLECAINIDE ACETATE 50 MG PO TABS: 50.0000 mg | ORAL_TABLET | Freq: Two times a day (BID) | ORAL | 3 refills | Status: DC

## 2021-02-16 NOTE — Patient Instructions (Addendum)
Medication Instructions:  Your physician has recommended you make the following change in your medication:  1.  START taking flecainide 50 mg- Take one tablet by mouth twice a day  Labwork: None ordered.  Testing/Procedures: None ordered.  Follow-Up:  Nurse visit for EKG:  March 01, 2021 at 2:00 pm at the [facility] office  Your physician wants you to follow-up in: 6 weeks with Cristopher Peru, MD   Apr 03, 2021 at 9:15 am at the [facility] office   Your wife is scheduled for her follow up on Apr 03, 2021 at 8:45 am at the [facility] office  If these appointments do not work for you please call [place] at [phone]  Any Other Special Instructions Will Be Listed Below (If Applicable).  If you need a refill on your cardiac medications before your next appointment, please call your pharmacy.   Flecainide tablets What is this medicine? FLECAINIDE (FLEK a nide) is an antiarrhythmic drug. This medicine is used to prevent irregular heart rhythm. It can also slow down fast heartbeats called tachycardia. This medicine may be used for other purposes; ask your health care provider or pharmacist if you have questions. COMMON BRAND NAME(S): Tambocor What should I tell my health care provider before I take this medicine? They need to know if you have any of these conditions:  abnormal levels of potassium in the blood  heart disease including heart rhythm and heart rate problems  kidney or liver disease  recent heart attack  an unusual or allergic reaction to flecainide, local anesthetics, other medicines, foods, dyes, or preservatives  pregnant or trying to get pregnant  breast-feeding How should I use this medicine? Take this medicine by mouth with a glass of water. Follow the directions on the prescription label. You can take this medicine with or without food. Take your doses at regular intervals. Do not take your medicine more often than directed. Do not stop taking this medicine  suddenly. This may cause serious, heart-related side effects. If your doctor wants you to stop the medicine, the dose may be slowly lowered over time to avoid any side effects. Talk to your pediatrician regarding the use of this medicine in children. While this drug may be prescribed for children as young as 1 year of age for selected conditions, precautions do apply. Overdosage: If you think you have taken too much of this medicine contact a poison control center or emergency room at once. NOTE: This medicine is only for you. Do not share this medicine with others. What if I miss a dose? If you miss a dose, take it as soon as you can. If it is almost time for your next dose, take only that dose. Do not take double or extra doses. What may interact with this medicine? Do not take this medicine with any of the following medications:  amoxapine  arsenic trioxide  certain antibiotics like clarithromycin, erythromycin, gatifloxacin, gemifloxacin, levofloxacin, moxifloxacin, sparfloxacin, or troleandomycin  certain antidepressants called tricyclic antidepressants like amitriptyline, imipramine, or nortriptyline  certain medicines to control heart rhythm like disopyramide, encainide, moricizine, procainamide, propafenone, and quinidine  cisapride  delavirdine  droperidol  haloperidol  haw[place]  imatinib  levomethadyl  maprotiline  medicines for malaria like chloroquine and halofantrine  pentamidine  phenothiazines like chlorpromazine, mesoridazine, prochlorperazine, thioridazine  pimozide  quinine  ranolazine  ritonavir  sertindole This medicine may also interact with the following medications:  cimetidine  dofetilide  medicines for angina or high blood pressure  medicines to control heart rhythm like amiodarone and digoxin  ziprasidone This list may not describe all possible interactions. Give your health care provider a list of all the medicines, herbs,  non-prescription drugs, or dietary supplements you use. Also tell them if you smoke, drink alcohol, or use illegal drugs. Some items may interact with your medicine. What should I watch for while using this medicine? Visit your doctor or health care professional for regular checks on your progress. Because your condition and the use of this medicine carries some risk, it is a good idea to carry an identification card, necklace or bracelet with details of your condition, medications and doctor or health care professional. Check your blood pressure and pulse rate regularly. Ask your health care professional what your blood pressure and pulse rate should be, and when you should contact him or her. Your doctor or health care professional also may schedule regular blood tests and electrocardiograms to check your progress. You may get drowsy or dizzy. Do not drive, use machinery, or do anything that needs mental alertness until you know how this medicine affects you. Do not stand or sit up quickly, especially if you are an older patient. This reduces the risk of dizzy or fainting spells. Alcohol can make you more dizzy, increase flushing and rapid heartbeats. Avoid alcoholic drinks. What side effects may I notice from receiving this medicine? Side effects that you should report to your doctor or health care professional as soon as possible:  chest pain, continued irregular heartbeats  difficulty breathing  swelling of the legs or feet  trembling, shaking  unusually weak or tired Side effects that usually do not require medical attention (report to your doctor or health care professional if they continue or are bothersome):  blurred vision  constipation  headache  nausea, vomiting  stomach pain This list may not describe all possible side effects. Call your doctor for medical advice about side effects. You may report side effects to FDA at 1-800-FDA-1088. Where should I keep my medicine? Keep  out of the reach of children. Store at room temperature between 15 and 30 degrees C (59 and 86 degrees F). Protect from light. Keep container tightly closed. Throw away any unused medicine after the expiration date. NOTE: This sheet is a summary. It may not cover all possible information. If you have questions about this medicine, talk to your doctor, pharmacist, or health care provider.  2021 Elsevier/Gold Standard (2018-10-12 11:41:38)

## 2021-02-16 NOTE — Progress Notes (Signed)
HPI Alex Dunn is referred by Dr. Arn Dunn for evaluation of NSVT/PVC's. He states that he had night sweats that developed about 3 years ago which were intermittent. He underwent workup with no etiology. He notes that about once a month he will feel like he has the flu with weakness and fatigue. He does not have palpitations but he does note that during these times his pulse will be low and he has had documented ventricular bigeminy. He has not had syncope but a cardiac monitor demonstrated NSVT and 16% PVC's. He was tried on very low dose metoprolol but did not have any improvement and stopped the drug.  Allergies  Allergen Reactions  . Codeine Phosphate Other (See Comments)    Seen odd things crawling up wall  . Pregabalin Other (See Comments)    Drowsy, nightmares, stumbling  . Amitriptyline Other (See Comments)    SLEPT FOR 18-19 HOURS STRAIGHT  . Lexapro [Escitalopram Oxalate] Other (See Comments)    shaking     Current Outpatient Medications  Medication Sig Dispense Refill  . Acetaminophen (TYLENOL PO) Take by mouth as needed (pain).    . cyanocobalamin 1000 MCG tablet Take by mouth.    . losartan (COZAAR) 100 MG tablet TAKE 1 TABLET(100 MG) BY MOUTH DAILY 90 tablet 1  . metoprolol tartrate (LOPRESSOR) 25 MG tablet Take 0.5 tablets (12.5 mg total) by mouth 2 (two) times daily. 90 tablet 3  . Multiple Vitamins-Minerals (ICAPS AREDS 2 PO) Take 1 each by mouth daily.    . pantoprazole (PROTONIX) 40 MG tablet Take 1 tablet (40 mg total) by mouth daily as needed. 90 tablet 3  . traMADol (ULTRAM) 50 MG tablet TAKE 1 TABLET(50 MG) BY MOUTH EVERY 12 HOURS AS NEEDED FOR MODERATE PAIN 90 tablet 1  . VITAMIN D PO Take 2,000 Int'l Units by mouth daily.     Current Facility-Administered Medications  Medication Dose Route Frequency Provider Last Rate Last Admin  . cyanocobalamin ((VITAMIN B-12)) injection 1,000 mcg  1,000 mcg Intramuscular Q30 days Alex Pal, DO   1,000  mcg at 12/21/20 1410     Past Medical History:  Diagnosis Date  . Abdominal aortic atherosclerosis ([place]) 04/15/2018  . ANEMIA-IRON DEFICIENCY 06/23/2007  . Ataxia 02/14/2020  . B12 deficiency 06/13/2020  . Balance problem 01/10/2020  . BENIGN PROSTATIC HYPERTROPHY 06/23/2007  . Bilateral leg pain 02/14/2020  . BPH (benign prostatic hyperplasia) 06/23/2007   Qualifier: Diagnosis of  By: Alex Fusi, RN, Alex Dunn   Formatting of this note might be different from the original. Formatting of this note might be different from the original. Qualifier: Diagnosis of  By: Alex Fusi, RN, Alex Dunn  . Chronic kidney disease 10/18/2016  . Degeneration of lumbar intervertebral disc 06/30/2018  . Diverticular disease 04/15/2018  . Diverticulosis   . Elevated diaphragm 04/15/2018  . Essential hypertension 04/15/2018  . Gastro-esophageal reflux disease without esophagitis 06/23/2007   Qualifier: Diagnosis of  By: Alex Fusi, RN, Alex Dunn   Formatting of this note might be different from the original. Formatting of this note might be different from the original. Qualifier: Diagnosis of  By: Alex Fusi, RN, Alex Dunn  . GERD 06/23/2007  . Hypertension   . Low back pain 03/09/2019  . Lumbar radiculopathy 05/06/2019  . Lumbosacral spondylosis without myelopathy 03/10/2019  . MGUS (monoclonal gammopathy of unknown significance)   . Monoclonal gammopathy of unknown significance (MGUS) 08/24/2018  . Multiple renal cysts 04/06/2018  . Night sweats 03/13/2018  Formatting of this note might be different from the original. Last Assessment & Plan:  Formatting of this note might be different from the original. It appears as if these have resolved now but were part of the constellation of presenting symptoms. Could reflect infectious, metabolic/endocrine, autoimmune process contributing.  . Nonsustained ventricular tachycardia ([place]) 11/03/2019  . Polycystic kidney disease    stage 3 per wife  . Primary osteoarthritis of both knees 06/30/2018   Bilateral  moderate with moderate chondromalacia patella Formatting of this note might be different from the original. Formatting of this note might be different from the original. Bilateral moderate with moderate chondromalacia patella  . Stage 3b chronic kidney disease ([place]) 06/21/2020  . Weakness 03/13/2018   Formatting of this note might be different from the original. Last Assessment & Plan:  Formatting of this note might be different from the original. Alex Dunn has had a fairly extensive work up regarding his concerning and sudden symptoms. He and his wife are frustrated and very concerned they will not identify what is going on to explain. We spent a lot of time reviewing all consults and l  . Weakness of both lower extremities 01/10/2020    ROS:   All systems reviewed and negative except as noted in the HPI.   Past Surgical History:  Procedure Laterality Date  . HAND SURGERY    . LUNG SURGERY     for collapsed lung     Family History  Problem Relation Age of Onset  . Heart attack Mother   . Heart attack Father   . Heart attack Brother        "leaky valve"  . Neuromuscular disorder Neg Hx      Social History   Socioeconomic History  . Marital status: Married    Spouse name: Not on file  . Number of children: 1  . Years of education: Not on file  . Highest education level: High school graduate  Occupational History  . Not on file  Tobacco Use  . Smoking status: Never Smoker  . Smokeless tobacco: Never Used  Vaping Use  . Vaping Use: Never used  Substance and Sexual Activity  . Alcohol use: Never  . Drug use: Never  . Sexual activity: Not on file  Other Topics Concern  . Not on file  Social History Narrative   Lives at home with his wife   Retired   Right handed   Caffeine: about 2 cups daily   Social Determinants of Health   Financial Resource Strain: Not on file  Food Insecurity: Not on file  Transportation Needs: Not on file  Physical Activity: Not on  file  Stress: Not on file  Social Connections: Not on file  Intimate Partner Violence: Not on file     BP 140/64   Pulse 72   Ht 6' (1.829 m)   Wt 190 lb (86.2 kg)   SpO2 96%   BMI 25.77 kg/m   Physical Exam:  Well appearing 85 yo man, NAD HEENT: Unremarkable Neck:  6 cm JVD, no thyromegally Lymphatics:  No adenopathy Back:  No CVA tenderness Lungs:  Clear with no wheezes HEART:  Regular rate rhythm, no murmurs, no rubs, no clicks Abd:  soft, positive bowel sounds, no organomegally, no rebound, no guarding Ext:  2 plus pulses, no edema, no cyanosis, no clubbing Skin:  No rashes no nodules Neuro:  CN II through XII intact, motor grossly intact  EKG reviewed.  Assess/Plan: 1. Ventricular ectopy - it is unclear to me how much his PVC's are playing a role in his symptoms but they could be contributing. He has normal LV function and a narrow QRS and no known obstructive CAD. I have recommended a trial of low dose flecainide. He will return for an ECG in 2 weeks and I will see him back in 6 weeks.  2. HTN - his bp is fairly well controlled.   Carleene Overlie Shilynn Hoch,MD

## 2021-02-19 ENCOUNTER — Other Ambulatory Visit: Payer: Self-pay | Admitting: Family Medicine

## 2021-02-19 DIAGNOSIS — G8929 Other chronic pain: Secondary | ICD-10-CM

## 2021-02-19 NOTE — Telephone Encounter (Signed)
Last OV--12/06/20 Last RF---10/09/2020--#90 with 1 refill

## 2021-02-20 DIAGNOSIS — I129 Hypertensive chronic kidney disease with stage 1 through stage 4 chronic kidney disease, or unspecified chronic kidney disease: Secondary | ICD-10-CM | POA: Diagnosis not present

## 2021-02-20 DIAGNOSIS — N1832 Chronic kidney disease, stage 3b: Secondary | ICD-10-CM | POA: Diagnosis not present

## 2021-02-20 DIAGNOSIS — Q619 Cystic kidney disease, unspecified: Secondary | ICD-10-CM | POA: Diagnosis not present

## 2021-02-23 ENCOUNTER — Other Ambulatory Visit (HOSPITAL_BASED_OUTPATIENT_CLINIC_OR_DEPARTMENT_OTHER): Payer: Medicare Other

## 2021-03-01 ENCOUNTER — Other Ambulatory Visit: Payer: Self-pay

## 2021-03-01 ENCOUNTER — Ambulatory Visit (INDEPENDENT_AMBULATORY_CARE_PROVIDER_SITE_OTHER): Payer: Medicare Other

## 2021-03-01 DIAGNOSIS — I493 Ventricular premature depolarization: Secondary | ICD-10-CM

## 2021-03-01 NOTE — Progress Notes (Signed)
1.) Reason for visit: new start flecainide for PVC's  2.) Name of MD requesting visit: Dr. Lovena Le  3.) H&P: Pt with NSVT and PVC's 16% burden.   Pt started on flecainide.  4.) ROS related to problem: Pt states he has been tolerating the flecainide well.  Thinks he has more energy since starting flecainide.    5.) Assessment and plan per MD: EKG reviewed by DOD.  Pt will continue flecainide 50 mg PO BID.  Pt would like to continue current dose for now and see how he continues to feel.  Follow up scheduled in 4 weeks with Dr. Lovena Le.

## 2021-03-01 NOTE — Patient Instructions (Signed)
Follow up as previously scheduled

## 2021-03-06 ENCOUNTER — Inpatient Hospital Stay (HOSPITAL_BASED_OUTPATIENT_CLINIC_OR_DEPARTMENT_OTHER): Payer: Medicare Other | Admitting: Family

## 2021-03-06 ENCOUNTER — Inpatient Hospital Stay: Payer: Medicare Other | Attending: Hematology & Oncology

## 2021-03-06 ENCOUNTER — Other Ambulatory Visit: Payer: Self-pay

## 2021-03-06 VITALS — BP 147/65 | HR 69 | Temp 98.3°F | Resp 17 | Ht 72.0 in | Wt 191.1 lb

## 2021-03-06 DIAGNOSIS — D472 Monoclonal gammopathy: Secondary | ICD-10-CM | POA: Diagnosis not present

## 2021-03-06 DIAGNOSIS — Z862 Personal history of diseases of the blood and blood-forming organs and certain disorders involving the immune mechanism: Secondary | ICD-10-CM | POA: Diagnosis not present

## 2021-03-06 DIAGNOSIS — D509 Iron deficiency anemia, unspecified: Secondary | ICD-10-CM | POA: Diagnosis not present

## 2021-03-06 DIAGNOSIS — N183 Chronic kidney disease, stage 3 unspecified: Secondary | ICD-10-CM

## 2021-03-06 DIAGNOSIS — R5383 Other fatigue: Secondary | ICD-10-CM | POA: Insufficient documentation

## 2021-03-06 LAB — CMP (CANCER CENTER ONLY)
ALT: 9 U/L (ref 0–44)
AST: 11 U/L — ABNORMAL LOW (ref 15–41)
Albumin: 3.7 g/dL (ref 3.5–5.0)
Alkaline Phosphatase: 85 U/L (ref 38–126)
Anion gap: 5 (ref 5–15)
BUN: 26 mg/dL — ABNORMAL HIGH (ref 8–23)
CO2: 29 mmol/L (ref 22–32)
Calcium: 9.2 mg/dL (ref 8.9–10.3)
Chloride: 104 mmol/L (ref 98–111)
Creatinine: 1.73 mg/dL — ABNORMAL HIGH (ref 0.61–1.24)
GFR, Estimated: 39 mL/min — ABNORMAL LOW (ref >60)
Glucose, Bld: 96 mg/dL (ref 70–99)
Potassium: 4.3 mmol/L (ref 3.5–5.1)
Sodium: 138 mmol/L (ref 135–145)
Total Bilirubin: 0.5 mg/dL (ref 0.3–1.2)
Total Protein: 6.5 g/dL (ref 6.5–8.1)

## 2021-03-06 LAB — CBC WITH DIFFERENTIAL (CANCER CENTER ONLY)
Abs Immature Granulocytes: 0.02 10*3/uL (ref 0.00–0.07)
Basophils Absolute: 0.1 10*3/uL (ref 0.0–0.1)
Basophils Relative: 1 %
Eosinophils Absolute: 0.2 10*3/uL (ref 0.0–0.5)
Eosinophils Relative: 3 %
HCT: 40.6 % (ref 39.0–52.0)
Hemoglobin: 13.4 g/dL (ref 13.0–17.0)
Immature Granulocytes: 0 %
Lymphocytes Relative: 28 %
Lymphs Abs: 1.6 10*3/uL (ref 0.7–4.0)
MCH: 31.5 pg (ref 26.0–34.0)
MCHC: 33 g/dL (ref 30.0–36.0)
MCV: 95.5 fL (ref 80.0–100.0)
Monocytes Absolute: 0.6 10*3/uL (ref 0.1–1.0)
Monocytes Relative: 10 %
Neutro Abs: 3.5 10*3/uL (ref 1.7–7.7)
Neutrophils Relative %: 58 %
Platelet Count: 173 10*3/uL (ref 150–400)
RBC: 4.25 MIL/uL (ref 4.22–5.81)
RDW: 13.1 % (ref 11.5–15.5)
WBC Count: 5.9 10*3/uL (ref 4.0–10.5)
nRBC: 0 % (ref 0.0–0.2)

## 2021-03-06 LAB — IRON AND TIBC
Iron: 74 ug/dL (ref 45–182)
Saturation Ratios: 22 % (ref 17.9–39.5)
TIBC: 331 ug/dL (ref 250–450)
UIBC: 257 ug/dL

## 2021-03-06 LAB — FERRITIN: Ferritin: 61 ng/mL (ref 24–336)

## 2021-03-06 NOTE — Progress Notes (Signed)
Hematology and Oncology Follow Up Visit  Alex Dunn [id number] April 05, 1936 85 y.o. 03/06/2021   Principle Diagnosis:  IgA kappa MGUS  Current Therapy:        Observation   Interim History:  Alex Dunn is here today with his wife for follow-up. He notes fatigue and feeling unsteady on his feet.  He states that he has an appointment with neurology next week on Monday for nerve conduction testing and work up for neuropathy. He has numbness and tingling in his lower extremities.  No falls or syncope to report.  No swelling in his extremities at this time. He has occasional puffiness in his ankles.  At last visit M-spike was 0.2 g/dL, IgA level 539 mg/dL and kappa light chains 6.66 mg/dL.  He has had issues with frequent PVC's and recently started Flecainide. No fever, chills, n/v, cough, rash, dizziness, SOB, chest pain, palpitations, abdominal pain or changes in bowel or bladder habits.  He has constipation and takes both a stool softener and a probiotic which seem to help.  No blood loss noted. No bruising or petechiae.  He states that his appetite comes and goes. He will try adding a protein shake to his diet. He prefers to graze throughout the day rather than eat large meals. He is doing his best to stay well hydrated. His weight is stable at 191 lbs.   ECOG Performance Status: 1 - Symptomatic but completely ambulatory  Medications:  Allergies as of 03/06/2021      Reactions   Codeine Phosphate Other (See Comments)   Seen odd things crawling up wall   Pregabalin Other (See Comments)   Drowsy, nightmares, stumbling   Amitriptyline Other (See Comments)   SLEPT FOR 18-19 HOURS STRAIGHT   Lexapro [escitalopram Oxalate] Other (See Comments)   shaking      Medication List       Accurate as of Mar 06, 2021  1:15 PM. If you have any questions, ask your nurse or doctor.        cyanocobalamin 1000 MCG tablet Take by mouth.   flecainide 50 MG tablet Commonly known as:  TAMBOCOR Take 1 tablet (50 mg total) by mouth 2 (two) times daily.   ICAPS AREDS 2 PO Take 1 each by mouth daily.   losartan 100 MG tablet Commonly known as: COZAAR TAKE 1 TABLET(100 MG) BY MOUTH DAILY   metoprolol tartrate 25 MG tablet Commonly known as: LOPRESSOR Take 0.5 tablets (12.5 mg total) by mouth 2 (two) times daily.   pantoprazole 40 MG tablet Commonly known as: PROTONIX Take 1 tablet (40 mg total) by mouth daily as needed.   traMADol 50 MG tablet Commonly known as: ULTRAM TAKE 1 TABLET(50 MG) BY MOUTH EVERY 12 HOURS AS NEEDED FOR MODERATE PAIN   TYLENOL PO Take by mouth as needed (pain).   VITAMIN D PO Take 2,000 Int'l Units by mouth daily.       Allergies:  Allergies  Allergen Reactions  . Codeine Phosphate Other (See Comments)    Seen odd things crawling up wall  . Pregabalin Other (See Comments)    Drowsy, nightmares, stumbling  . Amitriptyline Other (See Comments)    SLEPT FOR 18-19 HOURS STRAIGHT  . Lexapro [Escitalopram Oxalate] Other (See Comments)    shaking    Past Medical History, Surgical history, Social history, and Family History were reviewed and updated.  Review of Systems: All other 10 point review of systems is negative.   Physical Exam:  height  is 6' (1.829 m) and weight is 191 lb 1.9 oz (86.7 kg). His oral temperature is 98.3 F (36.8 C). His blood pressure is 147/65 (abnormal) and his pulse is 69. His respiration is 17 and oxygen saturation is 99%.   Wt Readings from Last 3 Encounters:  03/06/21 191 lb 1.9 oz (86.7 kg)  02/16/21 190 lb (86.2 kg)  01/17/21 193 lb (87.5 kg)    Ocular: Sclerae unicteric, pupils equal, round and reactive to light Ear-nose-throat: Oropharynx clear, dentition fair Lymphatic: No cervical or supraclavicular adenopathy Lungs no rales or rhonchi, good excursion bilaterally Heart regular rate and rhythm, no murmur appreciated Abd soft, nontender, positive bowel sounds MSK no focal spinal  tenderness, no joint edema Neuro: non-focal, well-oriented, appropriate affect Breasts: Deferred   Lab Results  Component Value Date   WBC 5.9 03/06/2021   HGB 13.4 03/06/2021   HCT 40.6 03/06/2021   MCV 95.5 03/06/2021   PLT 173 03/06/2021   Lab Results  Component Value Date   FERRITIN 90 09/06/2020   IRON 81 09/06/2020   TIBC 286 09/06/2020   UIBC 205 09/06/2020   IRONPCTSAT 28 09/06/2020   Lab Results  Component Value Date   RETICCTPCT 0.8 08/12/2018   RBC 4.25 03/06/2021   Lab Results  Component Value Date   KPAFRELGTCHN 66.6 (H) 09/06/2020   LAMBDASER 22.4 09/06/2020   KAPLAMBRATIO 2.97 (H) 09/06/2020   Lab Results  Component Value Date   IGGSERUM 1,032 09/06/2020   IGA 539 (H) 09/06/2020   IGMSERUM 58 09/06/2020   Lab Results  Component Value Date   TOTALPROTELP 6.4 09/06/2020   ALBUMINELP 3.3 09/06/2020   A1GS 0.2 09/06/2020   A2GS 0.7 09/06/2020   BETS 1.4 (H) 09/06/2020   GAMS 0.8 09/06/2020   MSPIKE 0.2 (H) 09/06/2020   SPEI Comment 09/06/2020     Chemistry      Component Value Date/Time   NA 139 09/06/2020 1028   NA 142 07/01/2019 1154   K 4.7 09/06/2020 1028   CL 104 09/06/2020 1028   CO2 31 09/06/2020 1028   BUN 26 (H) 09/06/2020 1028   BUN 25 07/01/2019 1154   CREATININE 1.68 (H) 09/06/2020 1028   CREATININE 1.61 (H) 02/26/2019 1511      Component Value Date/Time   CALCIUM 9.4 09/06/2020 1028   ALKPHOS 88 09/06/2020 1028   AST 11 (L) 09/06/2020 1028   ALT 9 09/06/2020 1028   BILITOT 0.5 09/06/2020 1028       Impression and Plan: Alex Dunn is a very pleasant 85 yo caucasian gentleman with IgA kappa MGUS. His counts in the past have remained stable.  His protein studies in the past have remained stable. Today's results are pending.  Iron studies are stable. No replacement needed at this time.  Follow-up in 6 months.  They were encouraged to contact our office with any questions or concerns.   Alex Peace, NP 5/3/20221:15  PM

## 2021-03-07 ENCOUNTER — Telehealth: Payer: Self-pay | Admitting: *Deleted

## 2021-03-07 LAB — IGG, IGA, IGM
IgA: 513 mg/dL — ABNORMAL HIGH (ref 61–437)
IgG (Immunoglobin G), Serum: 829 mg/dL (ref 603–1613)
IgM (Immunoglobulin M), Srm: 48 mg/dL (ref 15–143)

## 2021-03-07 LAB — KAPPA/LAMBDA LIGHT CHAINS
Kappa free light chain: 53.9 mg/L — ABNORMAL HIGH (ref 3.3–19.4)
Kappa, lambda light chain ratio: 2.35 — ABNORMAL HIGH (ref 0.26–1.65)
Lambda free light chains: 22.9 mg/L (ref 5.7–26.3)

## 2021-03-07 NOTE — Telephone Encounter (Signed)
Per 03/06/21 los - called and lvm of upcoming appointment. - mailed calendar

## 2021-03-08 LAB — PROTEIN ELECTROPHORESIS, SERUM
A/G Ratio: 1.3 (ref 0.7–1.7)
Albumin ELP: 3.4 g/dL (ref 2.9–4.4)
Alpha-1-Globulin: 0.2 g/dL (ref 0.0–0.4)
Alpha-2-Globulin: 0.7 g/dL (ref 0.4–1.0)
Beta Globulin: 1 g/dL (ref 0.7–1.3)
Gamma Globulin: 0.8 g/dL (ref 0.4–1.8)
Globulin, Total: 2.7 g/dL (ref 2.2–3.9)
Total Protein ELP: 6.1 g/dL (ref 6.0–8.5)

## 2021-03-12 DIAGNOSIS — M79604 Pain in right leg: Secondary | ICD-10-CM | POA: Diagnosis not present

## 2021-03-12 DIAGNOSIS — R531 Weakness: Secondary | ICD-10-CM | POA: Diagnosis not present

## 2021-03-12 DIAGNOSIS — M79605 Pain in left leg: Secondary | ICD-10-CM | POA: Diagnosis not present

## 2021-03-26 ENCOUNTER — Telehealth (INDEPENDENT_AMBULATORY_CARE_PROVIDER_SITE_OTHER): Payer: Medicare Other | Admitting: Family Medicine

## 2021-03-26 ENCOUNTER — Telehealth: Payer: Self-pay | Admitting: *Deleted

## 2021-03-26 ENCOUNTER — Other Ambulatory Visit: Payer: Self-pay

## 2021-03-26 ENCOUNTER — Encounter: Payer: Self-pay | Admitting: Family Medicine

## 2021-03-26 ENCOUNTER — Other Ambulatory Visit (HOSPITAL_BASED_OUTPATIENT_CLINIC_OR_DEPARTMENT_OTHER): Payer: Self-pay

## 2021-03-26 DIAGNOSIS — U071 COVID-19: Secondary | ICD-10-CM | POA: Diagnosis not present

## 2021-03-26 MED ORDER — PAXLOVID 20 X 150 MG & 10 X 100MG PO TBPK
2.0000 | ORAL_TABLET | Freq: Two times a day (BID) | ORAL | 0 refills | Status: AC
  Filled 2021-03-26: qty 20, 5d supply, fill #0

## 2021-03-26 NOTE — Telephone Encounter (Signed)
Who Is Calling Patient / Member / Family / Caregiver Call Type Triage / Clinical Caller Name [name] Relationship To Patient Spouse Return Phone Number [phone] (Primary) Chief Complaint Cough Reason for Call Symptomatic / Request for Health Information Initial Comment Caller states she is positive for covid and so is her husband. He has a cough and has a 99.1 temp. Pulse Ox is 93. He has a lot of health issues as well. Translation No Nurse Assessment Nurse: Doyle Askew, RN, Beth Date/Time (Eastern Time): 03/24/2021 12:01:28 PM Confirm and document reason for call. If symptomatic, describe symptoms. ---Caller states she is positive for covid and so is her husband (this morning). Caller states husband has a cough and has a 99.1 temp. Pulse Ox is 93%, has a lot of health issues as well. Does the patient have any new or worsening symptoms? ---Yes Will a triage be completed? ---Yes Related visit to physician within the last 2 weeks? ---No Does the PT have any chronic conditions? (i.e. diabetes, asthma, this includes High risk factors for pregnancy, etc.) ---Yes List chronic conditions. ---heart issues (PVCs), MGUS. HTN, polycystic kidney dz Is this a behavioral health or substance abuse call? ---No Guidelines Guideline Title Affirmed Question Affirmed Notes Nurse Date/Time (Eastern Time) COVID-19 - Diagnosed or Suspected MILD difficulty breathing (e.g.,  Disp. Time Eilene Ghazi Time) Disposition Final User 03/24/2021 12:17:33 PM See HCP within 4 Hours (or PCP triage) Yes Doyle Askew, RN, Beth minimal/no SOB at rest, SOB with walking, pulse <100) Doyle Askew, RN, [facility] 03/24/2021 12:05:06 PM

## 2021-03-26 NOTE — Progress Notes (Signed)
Chief Complaint  Patient presents with  . Covid Positive  . Wheezing    Fever on Saturday  . Sore Throat    Alex Dunn here for URI complaints. Due to COVID-19 pandemic, we are interacting via telephone. I verified patient's ID using 2 identifiers. Patient agreed to proceed with visit via this method. Patient is at home, I am at office. Patient and I are present for visit.   Duration: 2 days   Associated symptoms: Fever (99.9 F), sinus congestion, rhinorrhea and cough Denies: sinus pain, itchy watery eyes, ear pain, ear drainage, sore throat, wheezing, shortness of breath, myalgia and N/V/D Treatment to date: Tylenol, Benadryl Sick contacts: Yes - daughter and wife Tested +for covid on 5/21.   Past Medical History:  Diagnosis Date  . Abdominal aortic atherosclerosis ([place]) 04/15/2018  . ANEMIA-IRON DEFICIENCY 06/23/2007  . Ataxia 02/14/2020  . B12 deficiency 06/13/2020  . Balance problem 01/10/2020  . BENIGN PROSTATIC HYPERTROPHY 06/23/2007  . Bilateral leg pain 02/14/2020  . BPH (benign prostatic hyperplasia) 06/23/2007   Qualifier: Diagnosis of  By: Paulina Fusi, RN, Daine Gravel   Formatting of this note might be different from the original. Formatting of this note might be different from the original. Qualifier: Diagnosis of  By: Paulina Fusi, RN, Daine Gravel  . Chronic kidney disease 10/18/2016  . Degeneration of lumbar intervertebral disc 06/30/2018  . Diverticulosis   . Elevated diaphragm 04/15/2018  . Essential hypertension 04/15/2018  . Gastro-esophageal reflux disease without esophagitis 06/23/2007   Qualifier: Diagnosis of  By: Paulina Fusi, RN, Daine Gravel   Formatting of this note might be different from the original. Formatting of this note might be different from the original. Qualifier: Diagnosis of  By: Paulina Fusi, RN, Daine Gravel  . Hypertension   . Low back pain 03/09/2019  . Lumbar radiculopathy 05/06/2019  . Lumbosacral spondylosis without myelopathy 03/10/2019  . MGUS (monoclonal gammopathy of unknown  significance)   . Monoclonal gammopathy of unknown significance (MGUS) 08/24/2018  . Multiple renal cysts 04/06/2018  . Night sweats 03/13/2018   Formatting of this note might be different from the original. Last Assessment & Plan:  Formatting of this note might be different from the original. It appears as if these have resolved now but were part of the constellation of presenting symptoms. Could reflect infectious, metabolic/endocrine, autoimmune process contributing.  . Nonsustained ventricular tachycardia ([place]) 11/03/2019  . Polycystic kidney disease    stage 3 per wife  . Primary osteoarthritis of both knees 06/30/2018   Bilateral moderate with moderate chondromalacia patella Formatting of this note might be different from the original. Formatting of this note might be different from the original. Bilateral moderate with moderate chondromalacia patella  . Stage 3b chronic kidney disease ([place]) 06/21/2020  . Weakness of both lower extremities 01/10/2020   Objective No conversational dyspnea Age appropriate judgment and insight Nml affect and mood  COVID-19 - Plan: nirmatrelvir/ritonavir EUA, renal dosing, (PAXLOVID) TABS  Hold Flecainide while on Paxlovid. 5 d course, renal dosing.  Continue to push fluids, practice good hand hygiene, cover mouth when coughing. F/u prn. If starting to experience fevers, shaking, or shortness of breath, seek immediate care. Total time: 12 min Pt voiced understanding and agreement to the plan.  [place], DO 03/26/21 2:40 PM

## 2021-03-26 NOTE — Telephone Encounter (Signed)
Left message on machine to call back to schedule VV.

## 2021-03-26 NOTE — Telephone Encounter (Signed)
Patient schedule VV today 03/26/2021

## 2021-03-29 ENCOUNTER — Telehealth: Payer: Self-pay | Admitting: Family Medicine

## 2021-03-29 MED ORDER — BENZONATATE 100 MG PO CAPS: 100.0000 mg | ORAL_CAPSULE | Freq: Three times a day (TID) | ORAL | 0 refills | Status: DC | PRN

## 2021-03-29 NOTE — Telephone Encounter (Signed)
Sent, use honey also. Ty.

## 2021-03-29 NOTE — Telephone Encounter (Signed)
Patient states he has a horrible cough, much worse now. Patient denies any shortness of breath or wheezing. Wondering if you could prescribe something for the cough.   [id number] [address], [address] AT [facility] OF [address]  [place], [place] [address]  Phone:  [phone] Fax:  [phone]  DEA #:  [id number]

## 2021-03-30 ENCOUNTER — Other Ambulatory Visit: Payer: Self-pay | Admitting: Family Medicine

## 2021-03-30 DIAGNOSIS — U071 COVID-19: Secondary | ICD-10-CM

## 2021-03-30 NOTE — Telephone Encounter (Signed)
The wife called back and is just concerned regarding her husband. She stated last week she had covid and she was given the oral medication and was also given the infusion.  She does have pulmonary fibrosis, but would like to know would her husband benefit/be able to get the infusion as well along with the oral anti viral. She states her husband is just not feeling well at all and does not want him to end up in the ED over the weekend.

## 2021-03-30 NOTE — Telephone Encounter (Signed)
Called the nurse line [phone]) at the infusion ctr. To give the patient information and see if he qualified and to get him in asap. They will call back to let me know if he is approved///they then will schedule.

## 2021-03-30 NOTE — Telephone Encounter (Signed)
Melissa RN at infusion center did call back to let us know they were booked today and will be closed until Tuesday, but do not start back with infusions until Wednesday. At that time the patient would be way past the window of days eligible to get the infusion.  Called the family and informed of information. Informed the family that if any of his symptoms worsen he would need to go to the ER.

## 2021-03-30 NOTE — Telephone Encounter (Signed)
Informed of PCP instructions. 

## 2021-03-30 NOTE — Telephone Encounter (Signed)
Called the patient informed of PCP instructions. He stated the cough pills did help-he was told to use honey/drink lots of fluids. The patient stated he just feels so bad//weak. Completes anti viral tomorrow (Saturday 03/31/21)   Any other advisement.

## 2021-04-01 ENCOUNTER — Emergency Department (HOSPITAL_COMMUNITY)
Admission: EM | Admit: 2021-04-01 | Discharge: 2021-04-01 | Disposition: A | Discharge status: Home / Self Care | Payer: Medicare Other | Attending: Emergency Medicine | Admitting: Emergency Medicine

## 2021-04-01 ENCOUNTER — Encounter (HOSPITAL_COMMUNITY): Payer: Self-pay

## 2021-04-01 ENCOUNTER — Other Ambulatory Visit: Payer: Self-pay

## 2021-04-01 ENCOUNTER — Emergency Department (HOSPITAL_COMMUNITY): Payer: Medicare Other

## 2021-04-01 DIAGNOSIS — R059 Cough, unspecified: Secondary | ICD-10-CM | POA: Diagnosis not present

## 2021-04-01 DIAGNOSIS — Z79899 Other long term (current) drug therapy: Secondary | ICD-10-CM | POA: Diagnosis not present

## 2021-04-01 DIAGNOSIS — N183 Chronic kidney disease, stage 3 unspecified: Secondary | ICD-10-CM | POA: Insufficient documentation

## 2021-04-01 DIAGNOSIS — U071 COVID-19: Secondary | ICD-10-CM | POA: Diagnosis not present

## 2021-04-01 DIAGNOSIS — R531 Weakness: Secondary | ICD-10-CM | POA: Diagnosis not present

## 2021-04-01 DIAGNOSIS — I129 Hypertensive chronic kidney disease with stage 1 through stage 4 chronic kidney disease, or unspecified chronic kidney disease: Secondary | ICD-10-CM | POA: Insufficient documentation

## 2021-04-01 DIAGNOSIS — R9431 Abnormal electrocardiogram [ECG] [EKG]: Secondary | ICD-10-CM | POA: Diagnosis not present

## 2021-04-01 DIAGNOSIS — R6883 Chills (without fever): Secondary | ICD-10-CM | POA: Diagnosis not present

## 2021-04-01 LAB — CBC WITH DIFFERENTIAL/PLATELET
Abs Immature Granulocytes: 0.05 10*3/uL (ref 0.00–0.07)
Basophils Absolute: 0 10*3/uL (ref 0.0–0.1)
Basophils Relative: 0 %
Eosinophils Absolute: 0.1 10*3/uL (ref 0.0–0.5)
Eosinophils Relative: 2 %
HCT: 46.4 % (ref 39.0–52.0)
Hemoglobin: 14.9 g/dL (ref 13.0–17.0)
Immature Granulocytes: 1 %
Lymphocytes Relative: 19 %
Lymphs Abs: 1.5 10*3/uL (ref 0.7–4.0)
MCH: 31.2 pg (ref 26.0–34.0)
MCHC: 32.1 g/dL (ref 30.0–36.0)
MCV: 97.1 fL (ref 80.0–100.0)
Monocytes Absolute: 0.7 10*3/uL (ref 0.1–1.0)
Monocytes Relative: 9 %
Neutro Abs: 5.5 10*3/uL (ref 1.7–7.7)
Neutrophils Relative %: 69 %
Platelets: 240 10*3/uL (ref 150–400)
RBC: 4.78 MIL/uL (ref 4.22–5.81)
RDW: 13.1 % (ref 11.5–15.5)
WBC: 7.9 10*3/uL (ref 4.0–10.5)
nRBC: 0 % (ref 0.0–0.2)

## 2021-04-01 LAB — COMPREHENSIVE METABOLIC PANEL
ALT: 18 U/L (ref 0–44)
AST: 18 U/L (ref 15–41)
Albumin: 3.5 g/dL (ref 3.5–5.0)
Alkaline Phosphatase: 75 U/L (ref 38–126)
Anion gap: 8 (ref 5–15)
BUN: 20 mg/dL (ref 8–23)
CO2: 23 mmol/L (ref 22–32)
Calcium: 9.2 mg/dL (ref 8.9–10.3)
Chloride: 106 mmol/L (ref 98–111)
Creatinine, Ser: 1.51 mg/dL — ABNORMAL HIGH (ref 0.61–1.24)
GFR, Estimated: 46 mL/min — ABNORMAL LOW (ref >60)
Glucose, Bld: 111 mg/dL — ABNORMAL HIGH (ref 70–99)
Potassium: 4.6 mmol/L (ref 3.5–5.1)
Sodium: 137 mmol/L (ref 135–145)
Total Bilirubin: 0.1 mg/dL — ABNORMAL LOW (ref 0.3–1.2)
Total Protein: 7 g/dL (ref 6.5–8.1)

## 2021-04-01 LAB — RESP PANEL BY RT-PCR (FLU A&B, COVID) ARPGX2
Influenza A by PCR: NEGATIVE
Influenza B by PCR: NEGATIVE
SARS Coronavirus 2 by RT PCR: POSITIVE — AB

## 2021-04-01 LAB — URINALYSIS, ROUTINE W REFLEX MICROSCOPIC
Bilirubin Urine: NEGATIVE
Glucose, UA: NEGATIVE mg/dL
Hgb urine dipstick: NEGATIVE
Ketones, ur: NEGATIVE mg/dL
Leukocytes,Ua: NEGATIVE
Nitrite: NEGATIVE
Protein, ur: NEGATIVE mg/dL
Specific Gravity, Urine: 1.013 (ref 1.005–1.030)
pH: 5 (ref 5.0–8.0)

## 2021-04-01 MED ORDER — METHYLPREDNISOLONE SODIUM SUCC 125 MG IJ SOLR: 125.0000 mg | Freq: Once | INTRAMUSCULAR | Status: DC | PRN

## 2021-04-01 MED ORDER — DIPHENHYDRAMINE HCL 50 MG/ML IJ SOLN: 50.0000 mg | Freq: Once | INTRAMUSCULAR | Status: DC | PRN

## 2021-04-01 MED ORDER — BEBTELOVIMAB 175 MG/2 ML IV (EUA)
175.0000 mg | Freq: Once | INTRAMUSCULAR | Status: AC
  Administered 2021-04-01: 175 mg via INTRAVENOUS
  Filled 2021-04-01: qty 2

## 2021-04-01 MED ORDER — FAMOTIDINE IN NACL 20-0.9 MG/50ML-% IV SOLN: 20.0000 mg | Freq: Once | INTRAVENOUS | Status: DC | PRN

## 2021-04-01 MED ORDER — ALBUTEROL SULFATE HFA 108 (90 BASE) MCG/ACT IN AERS: 2.0000 | INHALATION_SPRAY | Freq: Once | RESPIRATORY_TRACT | Status: DC | PRN

## 2021-04-01 MED ORDER — SODIUM CHLORIDE 0.9 % IV SOLN: INTRAVENOUS | Status: DC | PRN

## 2021-04-01 MED ORDER — EPINEPHRINE 0.3 MG/0.3ML IJ SOAJ: 0.3000 mg | Freq: Once | INTRAMUSCULAR | Status: DC | PRN

## 2021-04-01 NOTE — ED Notes (Signed)
Covid positive result given to [place], Therapist, sports

## 2021-04-01 NOTE — ED Notes (Signed)
Pt ambulated around room and SpO2 stayed between 95-99% on RA. Pt did not become more SHOB with ambulation.

## 2021-04-01 NOTE — ED Triage Notes (Signed)
Patient states he tested positive for Covid 8 days ago. Patient states cough is worsening with a productive cough /gray sputum and chills.

## 2021-04-01 NOTE — Discharge Instructions (Signed)
Continue taking home medications as prescribed. You may continue to have weakness for several days in the setting of your COVID infection. Call your primary care doctor as needed for further evaluation of your symptoms. Return to the emergency room if you develop high fevers, difficulty breathing, severe chest pain, inability to walk, or any new, worsening, or concerning symptoms.

## 2021-04-01 NOTE — ED Notes (Signed)
Updates provided to wife.

## 2021-04-01 NOTE — ED Provider Notes (Signed)
[facility] DEPT Provider Note   CSN: [id number] Arrival date & time: 04/01/21  1962     History Chief Complaint  Patient presents with  . Covid Positive  . Cough  . Chills    Alex Dunn is a 85 y.o. male presenting for evaluation of cough and generalized weakness.  Patient states he tested positive for COVID 8 days ago.  He felt poorly the first couple days, however felt well yesterday.  Today he woke up with generalized weakness which she states is severe.  He has associated productive cough.  He continues to have chills.  His wife also tested positive.  He is fully vaccinated with a booster.  He denies chest pain, shortness of breath, nausea, vomiting, abdominal pain, urinary symptoms, normal bowel movements.  He has been eating and drinking well.  He was started on Paxlovid, last dose was yesterday.  He was recently started on flecainide for arrhythmia, however this was paused while taking Paxlovid.  Additional history obtained in chart review.  History of anemia, B12 deficiency, BPH, CKD, hypertension, GERD, MGUS  HPI     Past Medical History:  Diagnosis Date  . Abdominal aortic atherosclerosis ([place]) 04/15/2018  . ANEMIA-IRON DEFICIENCY 06/23/2007  . Ataxia 02/14/2020  . B12 deficiency 06/13/2020  . Balance problem 01/10/2020  . BENIGN PROSTATIC HYPERTROPHY 06/23/2007  . Bilateral leg pain 02/14/2020  . BPH (benign prostatic hyperplasia) 06/23/2007   Qualifier: Diagnosis of  By: Paulina Fusi, RN, Daine Gravel   Formatting of this note might be different from the original. Formatting of this note might be different from the original. Qualifier: Diagnosis of  By: Paulina Fusi, RN, Daine Gravel  . Chronic kidney disease 10/18/2016  . Degeneration of lumbar intervertebral disc 06/30/2018  . Diverticulosis   . Elevated diaphragm 04/15/2018  . Essential hypertension 04/15/2018  . Gastro-esophageal reflux disease without esophagitis 06/23/2007   Qualifier: Diagnosis of  By:  Paulina Fusi, RN, Daine Gravel   Formatting of this note might be different from the original. Formatting of this note might be different from the original. Qualifier: Diagnosis of  By: Paulina Fusi, RN, Daine Gravel  . Hypertension   . Low back pain 03/09/2019  . Lumbar radiculopathy 05/06/2019  . Lumbosacral spondylosis without myelopathy 03/10/2019  . MGUS (monoclonal gammopathy of unknown significance)   . Monoclonal gammopathy of unknown significance (MGUS) 08/24/2018  . Multiple renal cysts 04/06/2018  . Night sweats 03/13/2018   Formatting of this note might be different from the original. Last Assessment & Plan:  Formatting of this note might be different from the original. It appears as if these have resolved now but were part of the constellation of presenting symptoms. Could reflect infectious, metabolic/endocrine, autoimmune process contributing.  . Nonsustained ventricular tachycardia ([place]) 11/03/2019  . Polycystic kidney disease    stage 3 per wife  . Primary osteoarthritis of both knees 06/30/2018   Bilateral moderate with moderate chondromalacia patella Formatting of this note might be different from the original. Formatting of this note might be different from the original. Bilateral moderate with moderate chondromalacia patella  . Stage 3b chronic kidney disease ([place]) 06/21/2020  . Weakness of both lower extremities 01/10/2020    Patient Active Problem List   Diagnosis Date Noted  . PVC's (premature ventricular contractions) 03/01/2021  . Polycystic kidney disease   . MGUS (monoclonal gammopathy of unknown significance)   . Diverticulosis   . Hypertension   . Stage 3b chronic kidney disease ([place]) 06/21/2020  .  B12 deficiency 06/13/2020  . Ataxia 02/14/2020  . Bilateral leg pain 02/14/2020  . Weakness of both lower extremities 01/10/2020  . Balance problem 01/10/2020  . Nonsustained ventricular tachycardia ([place]) 11/03/2019  . Lumbar radiculopathy 05/06/2019  . Lumbosacral spondylosis without  myelopathy 03/10/2019  . Low back pain 03/09/2019  . Monoclonal gammopathy of unknown significance (MGUS) 08/24/2018  . Primary osteoarthritis of both knees 06/30/2018  . Degeneration of lumbar intervertebral disc 06/30/2018  . Essential hypertension 04/15/2018  . Diverticular disease 04/15/2018  . Abdominal aortic atherosclerosis ([place]) 04/15/2018  . Elevated diaphragm 04/15/2018  . Multiple renal cysts 04/06/2018  . Night sweats 03/13/2018  . Weakness 03/13/2018  . Chronic kidney disease 10/18/2016  . ANEMIA-IRON DEFICIENCY 06/23/2007  . Gastro-esophageal reflux disease without esophagitis 06/23/2007  . BPH (benign prostatic hyperplasia) 06/23/2007  . GERD 06/23/2007    Past Surgical History:  Procedure Laterality Date  . HAND SURGERY    . LUNG SURGERY     for collapsed lung       Family History  Problem Relation Age of Onset  . Heart attack Mother   . Heart attack Father   . Heart attack Brother        "leaky valve"  . Neuromuscular disorder Neg Hx     Social History   Tobacco Use  . Smoking status: Never Smoker  . Smokeless tobacco: Never Used  Vaping Use  . Vaping Use: Never used  Substance Use Topics  . Alcohol use: Never  . Drug use: Never    Home Medications Prior to Admission medications   Medication Sig Start Date End Date Taking? Authorizing Provider  Acetaminophen (TYLENOL PO) Take by mouth as needed (pain).    [provider]  benzonatate (TESSALON) 100 MG capsule Take 1 capsule (100 mg total) by mouth 3 (three) times daily as needed. 03/29/21   Shelda Pal, DO  cyanocobalamin 1000 MCG tablet Take by mouth.    [provider]  flecainide (TAMBOCOR) 50 MG tablet Take 1 tablet (50 mg total) by mouth 2 (two) times daily. 02/16/21   Evans Lance, MD  losartan (COZAAR) 100 MG tablet TAKE 1 TABLET(100 MG) BY MOUTH DAILY 01/09/21   Shelda Pal, DO  metoprolol tartrate (LOPRESSOR) 25 MG tablet Take 0.5 tablets (12.5  mg total) by mouth 2 (two) times daily. 01/17/21 04/17/21  Park Liter, MD  Multiple Vitamins-Minerals (ICAPS AREDS 2 PO) Take 1 each by mouth daily.    [provider]  pantoprazole (PROTONIX) 40 MG tablet Take 1 tablet (40 mg total) by mouth daily as needed. 10/16/15   Marletta Lor, MD  traMADol (ULTRAM) 50 MG tablet TAKE 1 TABLET(50 MG) BY MOUTH EVERY 12 HOURS AS NEEDED FOR MODERATE PAIN 02/19/21   Shelda Pal, DO  VITAMIN D PO Take 2,000 Int'l Units by mouth daily.    [provider]    Allergies    Codeine phosphate, Pregabalin, Amitriptyline, and Lexapro [escitalopram oxalate]  Review of Systems   Review of Systems  Constitutional: Positive for chills.  Respiratory: Positive for cough.   Neurological: Positive for weakness.  All other systems reviewed and are negative.   Physical Exam Updated Vital Signs BP (!) 146/83   Pulse 65   Temp 98.4 F (36.9 C) (Oral)   Resp 17   Ht 6' (1.829 m)   Wt 87.5 kg   SpO2 99%   BMI 26.18 kg/m   Physical Exam Vitals and nursing note  reviewed.  Constitutional:      General: He is not in acute distress.    Appearance: He is well-developed.     Comments: Appears nontoxic  HENT:     Head: Normocephalic and atraumatic.  Eyes:     Conjunctiva/sclera: Conjunctivae normal.     Pupils: Pupils are equal, round, and reactive to light.  Cardiovascular:     Rate and Rhythm: Normal rate and regular rhythm.     Pulses: Normal pulses.  Pulmonary:     Effort: Pulmonary effort is normal. No respiratory distress.     Breath sounds: Normal breath sounds. No wheezing.     Comments: Clear lung sounds Abdominal:     General: There is no distension.     Palpations: Abdomen is soft. There is no mass.     Tenderness: There is no abdominal tenderness. There is no guarding or rebound.  Musculoskeletal:        General: Normal range of motion.     Cervical back: Normal range of motion and neck supple.      Right lower leg: No edema.     Left lower leg: No edema.  Skin:    General: Skin is warm and dry.     Capillary Refill: Capillary refill takes less than 2 seconds.  Neurological:     Mental Status: He is alert and oriented to person, place, and time.     ED Results / Procedures / Treatments   Labs (all labs ordered are listed, but only abnormal results are displayed) Labs Reviewed  RESP PANEL BY RT-PCR (FLU A&B, COVID) ARPGX2 - Abnormal; Notable for the following components:      Result Value   SARS Coronavirus 2 by RT PCR POSITIVE (*)    All other components within normal limits  COMPREHENSIVE METABOLIC PANEL - Abnormal; Notable for the following components:   Glucose, Bld 111 (*)    Creatinine, Ser 1.51 (*)    Total Bilirubin 0.1 (*)    GFR, Estimated 46 (*)    All other components within normal limits  CBC WITH DIFFERENTIAL/PLATELET  URINALYSIS, ROUTINE W REFLEX MICROSCOPIC    EKG EKG Interpretation  Date/Time:  Sunday Apr 01 2021 11:15:40 EDT Ventricular Rate:  67 PR Interval:  199 QRS Duration: 85 QT Interval:  376 QTC Calculation: 397 R Axis:   54 Text Interpretation: Sinus rhythm Multiple premature complexes, vent & supraven Confirmed by Malvin Johns [phone]) on 04/01/2021 12:50:34 PM   Radiology DG Chest Portable 1 View  Result Date: 04/01/2021 CLINICAL DATA:  85 year old male with history of worsening productive cough and chills for 1 week. Patient tested positive for COVID 1 week ago. EXAM: PORTABLE CHEST 1 VIEW COMPARISON:  Chest x-ray 05/31/2020. FINDINGS: Lung volumes are normal. No consolidative airspace disease. No pleural effusions. No pneumothorax. No pulmonary nodule or mass noted. Pulmonary vasculature and the cardiomediastinal silhouette are within normal limits. Atherosclerosis in the thoracic aorta. IMPRESSION: 1.  No radiographic evidence of acute cardiopulmonary disease. 2. Aortic atherosclerosis. Electronically Signed   By: Vinnie Langton M.D.    On: 04/01/2021 11:14    Procedures Procedures   Medications Ordered in ED Medications  0.9 %  sodium chloride infusion (has no administration in time range)  diphenhydrAMINE (BENADRYL) injection 50 mg (has no administration in time range)  famotidine (PEPCID) IVPB 20 mg premix (has no administration in time range)  methylPREDNISolone sodium succinate (SOLU-MEDROL) 125 mg/2 mL injection 125 mg (has no administration in time range)  albuterol (VENTOLIN HFA) 108 (90 Base) MCG/ACT inhaler 2 puff (has no administration in time range)  EPINEPHrine (EPI-PEN) injection 0.3 mg (has no administration in time range)  bebtelovimab EUA injection SOLN 175 mg (175 mg Intravenous Given 04/01/21 1438)    ED Course  I have reviewed the triage vital signs and the nursing notes.  Pertinent labs & imaging results that were available during my care of the patient were reviewed by me and considered in my medical decision making (see chart for details).    MDM Rules/Calculators/A&P                          Patient presented for evaluation of generalized weakness and cough in the setting of a known COVID infection.  On exam, patient appears nontoxic.  He reports symptoms worsened acutely this morning, as such consider secondary bacterial infection.  Consider other cause such as anemia, electrolyte abnormality, UTI, cardiac arrhythmia.  Consider continued COVID symptoms.  Will obtain labs, chest x-ray, ambulate on pulse ox.   Patient able to ambulate without hypoxia or difficulty ambulating.  Labs interpreted by me, overall reassuring.  Stable kidney function.  Electrolytes are stable.  Hemoglobin stable.  Chest x-ray viewed and independently interpreted by me, no pneumonia pneumothorax or effusion.  COVID test is positive.  In the setting of significant weakness, will discussed with pharmacy option of monoclonal antibody treatment.  Pharmacy states that as patient had paxlovid, he should not receive antivirals,  however is a candidate for monoclonal antibody treatment.  Discussed option with patient, who would like to pursue this.  I discussed that this is not a cure, but instead something to try and prevent further symptom progression. Discussed that his weakness is likely due to COVID infection, which will take time to improve.  Encouraged him to follow-up with primary care on an outpatient basis.  At this time, patient appears safe for discharge.  Return precautions given.  Patient states he understands and agrees to plan.   Alex Dunn was evaluated in Emergency Department on 04/01/2021 for the symptoms described in the history of present illness. He was evaluated in the context of the global COVID-19 pandemic, which necessitated consideration that the patient might be at risk for infection with the SARS-CoV-2 virus that causes COVID-19. Institutional protocols and algorithms that pertain to the evaluation of patients at risk for COVID-19 are in a state of rapid change based on information released by regulatory bodies including the CDC and federal and state organizations. These policies and algorithms were followed during the patient's care in the ED.   Final Clinical Impression(s) / ED Diagnoses Final diagnoses:  COVID-19  Generalized weakness    Rx / DC Orders ED Discharge Orders    None       Franchot Heidelberg, PA-C 04/01/21 1500    Malvin Johns, MD 04/04/21 [phone]

## 2021-04-03 ENCOUNTER — Ambulatory Visit: Payer: Medicare Other | Admitting: Internal Medicine

## 2021-04-10 ENCOUNTER — Ambulatory Visit: Payer: Medicare Other | Admitting: Internal Medicine

## 2021-04-10 DIAGNOSIS — M48062 Spinal stenosis, lumbar region with neurogenic claudication: Secondary | ICD-10-CM | POA: Diagnosis not present

## 2021-04-10 DIAGNOSIS — M5136 Other intervertebral disc degeneration, lumbar region: Secondary | ICD-10-CM | POA: Diagnosis not present

## 2021-04-10 DIAGNOSIS — M5416 Radiculopathy, lumbar region: Secondary | ICD-10-CM | POA: Diagnosis not present

## 2021-04-16 ENCOUNTER — Telehealth: Payer: Self-pay

## 2021-04-16 ENCOUNTER — Other Ambulatory Visit: Payer: Self-pay | Admitting: Family Medicine

## 2021-04-16 DIAGNOSIS — E538 Deficiency of other specified B group vitamins: Secondary | ICD-10-CM

## 2021-04-16 NOTE — Progress Notes (Signed)
Vitamin B12

## 2021-04-16 NOTE — Telephone Encounter (Signed)
Pt's Spouse, [name], called wanting to know if pt can resume B12 injections, last one was 12/21/20, before pt got Covid, or does pt need to come in to have his levels checked and see PCP first.  Please advise.

## 2021-04-16 NOTE — Telephone Encounter (Signed)
Called informed the patients wife of PCP response. Scheduled B12 shot for this coming Wednesday 04/18/21 at 2 PM

## 2021-04-16 NOTE — Telephone Encounter (Signed)
Called the patients wife and scheduled lab appt/put in the order for 04/18/21. They will wait for those results before scheduling his B12 shots.

## 2021-04-16 NOTE — Telephone Encounter (Signed)
The wife called back and they would like to hold off on starting the B12 shots. The patient would prefer to get some lab work first and see where his B12 is now. He is still having a lot of fatigue and not sure if it is the B12 or covid.

## 2021-04-18 ENCOUNTER — Ambulatory Visit: Payer: Medicare Other

## 2021-04-18 ENCOUNTER — Other Ambulatory Visit (INDEPENDENT_AMBULATORY_CARE_PROVIDER_SITE_OTHER): Payer: Medicare Other

## 2021-04-18 ENCOUNTER — Other Ambulatory Visit: Payer: Self-pay

## 2021-04-18 DIAGNOSIS — E538 Deficiency of other specified B group vitamins: Secondary | ICD-10-CM | POA: Diagnosis not present

## 2021-04-18 LAB — VITAMIN B12: Vitamin B-12: 723 pg/mL (ref 211–911)

## 2021-04-20 DIAGNOSIS — M5136 Other intervertebral disc degeneration, lumbar region: Secondary | ICD-10-CM | POA: Diagnosis not present

## 2021-04-20 DIAGNOSIS — M5416 Radiculopathy, lumbar region: Secondary | ICD-10-CM | POA: Diagnosis not present

## 2021-04-20 DIAGNOSIS — M48062 Spinal stenosis, lumbar region with neurogenic claudication: Secondary | ICD-10-CM | POA: Diagnosis not present

## 2021-04-23 ENCOUNTER — Ambulatory Visit: Payer: Medicare Other | Admitting: Cardiology

## 2021-04-25 ENCOUNTER — Ambulatory Visit: Payer: Medicare Other | Admitting: Internal Medicine

## 2021-04-25 ENCOUNTER — Other Ambulatory Visit: Payer: Self-pay

## 2021-04-25 ENCOUNTER — Encounter: Payer: Self-pay | Admitting: Internal Medicine

## 2021-04-25 VITALS — BP 140/64 | HR 77 | Ht 72.0 in | Wt 193.6 lb

## 2021-04-25 DIAGNOSIS — I493 Ventricular premature depolarization: Secondary | ICD-10-CM | POA: Diagnosis not present

## 2021-04-25 MED ORDER — LOSARTAN POTASSIUM 50 MG PO TABS: 50.0000 mg | ORAL_TABLET | Freq: Every day | ORAL | 3 refills | Status: DC

## 2021-04-25 NOTE — Progress Notes (Signed)
HPI Mr. Coller returns today for ongoing evaluation of NSVT/PVC's. He does not have palpitations but he does note that during these times his pulse will be low and he has had documented ventricular bigeminy. He has not had syncope but a cardiac monitor demonstrated NSVT and 16% PVC's. He was tried on very low dose metoprolol but did not have any improvement and stopped the drug. When I saw him last he was started on flecainide. He feels well but does note some orthostatic symptoms. His bp at home is normal.  Allergies  Allergen Reactions   Codeine Phosphate Other (See Comments)    Seen odd things crawling up wall   Pregabalin Other (See Comments)    Drowsy, nightmares, stumbling   Amitriptyline Other (See Comments)    SLEPT FOR 18-19 HOURS STRAIGHT   Lexapro [Escitalopram Oxalate] Other (See Comments)    shaking     Current Outpatient Medications  Medication Sig Dispense Refill   Acetaminophen (TYLENOL PO) Take by mouth as needed (pain).     cyanocobalamin 1000 MCG tablet Take by mouth.     flecainide (TAMBOCOR) 50 MG tablet Take 1 tablet (50 mg total) by mouth 2 (two) times daily. 180 tablet 3   losartan (COZAAR) 100 MG tablet TAKE 1 TABLET(100 MG) BY MOUTH DAILY 90 tablet 1   Multiple Vitamins-Minerals (ICAPS AREDS 2 PO) Take 1 each by mouth daily.     pantoprazole (PROTONIX) 40 MG tablet Take 1 tablet (40 mg total) by mouth daily as needed. 90 tablet 3   traMADol (ULTRAM) 50 MG tablet TAKE 1 TABLET(50 MG) BY MOUTH EVERY 12 HOURS AS NEEDED FOR MODERATE PAIN 90 tablet 1   VITAMIN D PO Take 2,000 Int'l Units by mouth daily.     Current Facility-Administered Medications  Medication Dose Route Frequency Provider Last Rate Last Admin   cyanocobalamin ((VITAMIN B-12)) injection 1,000 mcg  1,000 mcg Intramuscular Q30 days Shelda Pal, DO   1,000 mcg at 12/21/20 1410     Past Medical History:  Diagnosis Date   Abdominal aortic atherosclerosis ([place]) 04/15/2018    ANEMIA-IRON DEFICIENCY 06/23/2007   Ataxia 02/14/2020   B12 deficiency 06/13/2020   Balance problem 01/10/2020   BENIGN PROSTATIC HYPERTROPHY 06/23/2007   Bilateral leg pain 02/14/2020   BPH (benign prostatic hyperplasia) 06/23/2007   Qualifier: Diagnosis of  By: Paulina Fusi, RN, Daine Gravel   Formatting of this note might be different from the original. Formatting of this note might be different from the original. Qualifier: Diagnosis of  By: Paulina Fusi, RN, Daine Gravel   Chronic kidney disease 10/18/2016   Degeneration of lumbar intervertebral disc 06/30/2018   Diverticulosis    Elevated diaphragm 04/15/2018   Essential hypertension 04/15/2018   Gastro-esophageal reflux disease without esophagitis 06/23/2007   Qualifier: Diagnosis of  By: Paulina Fusi, RN, Daine Gravel   Formatting of this note might be different from the original. Formatting of this note might be different from the original. Qualifier: Diagnosis of  By: Paulina Fusi, RN, Daine Gravel   Hypertension    Low back pain 03/09/2019   Lumbar radiculopathy 05/06/2019   Lumbosacral spondylosis without myelopathy 03/10/2019   MGUS (monoclonal gammopathy of unknown significance)    Monoclonal gammopathy of unknown significance (MGUS) 08/24/2018   Multiple renal cysts 04/06/2018   Night sweats 03/13/2018   Formatting of this note might be different from the original. Last Assessment & Plan:  Formatting of this note might be different from the original. It  appears as if these have resolved now but were part of the constellation of presenting symptoms. Could reflect infectious, metabolic/endocrine, autoimmune process contributing.   Nonsustained ventricular tachycardia ([place]) 11/03/2019   Polycystic kidney disease    stage 3 per wife   Primary osteoarthritis of both knees 06/30/2018   Bilateral moderate with moderate chondromalacia patella Formatting of this note might be different from the original. Formatting of this note might be different from the original. Bilateral moderate with moderate  chondromalacia patella   Stage 3b chronic kidney disease ([place]) 06/21/2020   Weakness of both lower extremities 01/10/2020    ROS:   All systems reviewed and negative except as noted in the HPI.   Past Surgical History:  Procedure Laterality Date   HAND SURGERY     LUNG SURGERY     for collapsed lung     Family History  Problem Relation Age of Onset   Heart attack Mother    Heart attack Father    Heart attack Brother        "leaky valve"   Neuromuscular disorder Neg Hx      Social History   Socioeconomic History   Marital status: Married    Spouse name: Not on file   Number of children: 1   Years of education: Not on file   Highest education level: High school graduate  Occupational History   Not on file  Tobacco Use   Smoking status: Never   Smokeless tobacco: Never  Vaping Use   Vaping Use: Never used  Substance and Sexual Activity   Alcohol use: Never   Drug use: Never   Sexual activity: Not on file  Other Topics Concern   Not on file  Social History Narrative   Lives at home with his wife   Retired   Right handed   Caffeine: about 2 cups daily   Social Determinants of Radio broadcast assistant Strain: Not on file  Food Insecurity: Not on file  Transportation Needs: Not on file  Physical Activity: Not on file  Stress: Not on file  Social Connections: Not on file  Intimate Partner Violence: Not on file     BP 140/64   Pulse 77   Ht 6' (1.829 m)   Wt 193 lb 9.6 oz (87.8 kg)   SpO2 96%   BMI 26.26 kg/m   Physical Exam:  Well appearing 85 yo man, NAD HEENT: Unremarkable Neck:  6 cm JVD, no thyromegally Lymphatics:  No adenopathy Back:  No CVA tenderness Lungs:  Clear with no wheezes HEART:  Regular rate rhythm, no murmurs, no rubs, no clicks Abd:  soft, positive bowel sounds, no organomegally, no rebound, no guarding Ext:  2 plus pulses, no edema, no cyanosis, no clubbing Skin:  No rashes no nodules Neuro:  CN II through XII intact,  motor grossly intact  EKG - nsr   Assess/Plan:  1. Ventricular ectopy - he will continue the flecainide. It appears that the PVC's are well controlled. 2. HTN - his bp is fairly well controlled at home and he c/o orthostasis. I have asked him to reduce his losartan. 3. Orthostasis - we discussed the importance of standing up slowly. He will continue his current meds except as noted above. 4. Covid infection - he has recovered over the past week or so but felt so poorly that he require ER care. We will follow.  Carleene Overlie Ashtin Melichar,MD

## 2021-04-25 NOTE — Patient Instructions (Addendum)
Medication Instructions:  Your physician has recommended you make the following change in your medication:    Reduce your losartan-  Take 50 mg by mouth once a day (you may take 1/2 tablet of your 100 mg tablets until they are gone)   Labwork: None ordered.  Testing/Procedures: None ordered.  Follow-Up: Your physician wants you to follow-up in: one year with Cristopher Peru, MD or one of the following Advanced Practice Providers on your designated Care Team:   Chanetta Marshall, NP Tommye Standard, PA-C Legrand Como "Jonni Sanger" Chalmers Cater, [place]   Any Other Special Instructions Will Be Listed Below (If Applicable).  If you need a refill on your cardiac medications before your next appointment, please call your pharmacy.

## 2021-05-15 DIAGNOSIS — M5030 Other cervical disc degeneration, unspecified cervical region: Secondary | ICD-10-CM | POA: Diagnosis not present

## 2021-05-15 DIAGNOSIS — G629 Polyneuropathy, unspecified: Secondary | ICD-10-CM | POA: Diagnosis not present

## 2021-05-15 DIAGNOSIS — M5416 Radiculopathy, lumbar region: Secondary | ICD-10-CM | POA: Diagnosis not present

## 2021-05-15 DIAGNOSIS — M4692 Unspecified inflammatory spondylopathy, cervical region: Secondary | ICD-10-CM | POA: Diagnosis not present

## 2021-05-15 DIAGNOSIS — M79601 Pain in right arm: Secondary | ICD-10-CM | POA: Diagnosis not present

## 2021-05-15 DIAGNOSIS — M79602 Pain in left arm: Secondary | ICD-10-CM | POA: Diagnosis not present

## 2021-05-21 DIAGNOSIS — M25542 Pain in joints of left hand: Secondary | ICD-10-CM | POA: Diagnosis not present

## 2021-05-21 DIAGNOSIS — M2560 Stiffness of unspecified joint, not elsewhere classified: Secondary | ICD-10-CM | POA: Diagnosis not present

## 2021-05-21 DIAGNOSIS — M5412 Radiculopathy, cervical region: Secondary | ICD-10-CM | POA: Diagnosis not present

## 2021-05-21 DIAGNOSIS — M25541 Pain in joints of right hand: Secondary | ICD-10-CM | POA: Diagnosis not present

## 2021-05-24 DIAGNOSIS — M25541 Pain in joints of right hand: Secondary | ICD-10-CM | POA: Diagnosis not present

## 2021-05-24 DIAGNOSIS — M25542 Pain in joints of left hand: Secondary | ICD-10-CM | POA: Diagnosis not present

## 2021-05-24 DIAGNOSIS — M2560 Stiffness of unspecified joint, not elsewhere classified: Secondary | ICD-10-CM | POA: Diagnosis not present

## 2021-05-24 DIAGNOSIS — M5412 Radiculopathy, cervical region: Secondary | ICD-10-CM | POA: Diagnosis not present

## 2021-05-28 DIAGNOSIS — M25541 Pain in joints of right hand: Secondary | ICD-10-CM | POA: Diagnosis not present

## 2021-05-28 DIAGNOSIS — M2560 Stiffness of unspecified joint, not elsewhere classified: Secondary | ICD-10-CM | POA: Diagnosis not present

## 2021-05-28 DIAGNOSIS — M25542 Pain in joints of left hand: Secondary | ICD-10-CM | POA: Diagnosis not present

## 2021-05-28 DIAGNOSIS — M5412 Radiculopathy, cervical region: Secondary | ICD-10-CM | POA: Diagnosis not present

## 2021-05-30 DIAGNOSIS — M5412 Radiculopathy, cervical region: Secondary | ICD-10-CM | POA: Diagnosis not present

## 2021-05-30 DIAGNOSIS — M25541 Pain in joints of right hand: Secondary | ICD-10-CM | POA: Diagnosis not present

## 2021-05-30 DIAGNOSIS — M25542 Pain in joints of left hand: Secondary | ICD-10-CM | POA: Diagnosis not present

## 2021-05-30 DIAGNOSIS — M2560 Stiffness of unspecified joint, not elsewhere classified: Secondary | ICD-10-CM | POA: Diagnosis not present

## 2021-06-05 ENCOUNTER — Ambulatory Visit (INDEPENDENT_AMBULATORY_CARE_PROVIDER_SITE_OTHER): Payer: Medicare Other | Admitting: Family Medicine

## 2021-06-05 ENCOUNTER — Encounter: Payer: Self-pay | Admitting: Family Medicine

## 2021-06-05 ENCOUNTER — Other Ambulatory Visit: Payer: Self-pay

## 2021-06-05 VITALS — BP 138/66 | HR 75 | Temp 98.4°F | Ht 72.0 in | Wt 196.4 lb

## 2021-06-05 DIAGNOSIS — M792 Neuralgia and neuritis, unspecified: Secondary | ICD-10-CM | POA: Diagnosis not present

## 2021-06-05 MED ORDER — VENLAFAXINE HCL ER 37.5 MG PO CP24: 37.5000 mg | ORAL_CAPSULE | Freq: Every day | ORAL | 2 refills | Status: DC

## 2021-06-05 NOTE — Progress Notes (Signed)
Chief Complaint  Patient presents with   Follow-up    Subjective: Patient is a 85 y.o. male here for f/u.  Patient has been treated for chronic bilateral leg pain thought to be neuropathy over the past several years.  He did receive an injection from the [facility] re[facility] team that did help for 6 months.  The most recent shot did not help and he was subsequently referred to the neurology team.  His balance is affected due to the pain.  When he rests, he has no symptoms.  After taking a few steps, he starts having pain in his lower extremities.  It is more around his ankles and feet rather than his calves.  Past Medical History:  Diagnosis Date   Abdominal aortic atherosclerosis ([place]) 04/15/2018   ANEMIA-IRON DEFICIENCY 06/23/2007   Ataxia 02/14/2020   B12 deficiency 06/13/2020   Balance problem 01/10/2020   BENIGN PROSTATIC HYPERTROPHY 06/23/2007   Bilateral leg pain 02/14/2020   BPH (benign prostatic hyperplasia) 06/23/2007   Qualifier: Diagnosis of  By: Paulina Fusi, RN, Daine Gravel   Formatting of this note might be different from the original. Formatting of this note might be different from the original. Qualifier: Diagnosis of  By: Paulina Fusi, RN, Daine Gravel   Chronic kidney disease 10/18/2016   Degeneration of lumbar intervertebral disc 06/30/2018   Diverticulosis    Elevated diaphragm 04/15/2018   Essential hypertension 04/15/2018   Gastro-esophageal reflux disease without esophagitis 06/23/2007   Qualifier: Diagnosis of  By: Paulina Fusi, RN, Daine Gravel   Formatting of this note might be different from the original. Formatting of this note might be different from the original. Qualifier: Diagnosis of  By: Paulina Fusi, RN, Daine Gravel   Hypertension    Low back pain 03/09/2019   Lumbar radiculopathy 05/06/2019   Lumbosacral spondylosis without myelopathy 03/10/2019   MGUS (monoclonal gammopathy of unknown significance)    Monoclonal gammopathy of unknown significance (MGUS) 08/24/2018   Multiple renal cysts  04/06/2018   Night sweats 03/13/2018   Formatting of this note might be different from the original. Last Assessment & Plan:  Formatting of this note might be different from the original. It appears as if these have resolved now but were part of the constellation of presenting symptoms. Could reflect infectious, metabolic/endocrine, autoimmune process contributing.   Nonsustained ventricular tachycardia ([place]) 11/03/2019   Polycystic kidney disease    stage 3 per wife   Primary osteoarthritis of both knees 06/30/2018   Bilateral moderate with moderate chondromalacia patella Formatting of this note might be different from the original. Formatting of this note might be different from the original. Bilateral moderate with moderate chondromalacia patella   Stage 3b chronic kidney disease ([place]) 06/21/2020   Weakness of both lower extremities 01/10/2020    Objective: BP 138/66   Pulse 75   Temp 98.4 F (36.9 C) (Oral)   Ht 6' (1.829 m)   Wt 196 lb 6 oz (89.1 kg)   SpO2 95%   BMI 26.63 kg/m  General: Awake, appears stated age Heart: RRR, no LE edema Lungs: CTAB, no rales, wheezes or rhonchi. No accessory muscle use MSK: He is sitting when I am examining him and there is no tenderness to palpation or edema noted Psych: Age appropriate judgment and insight, normal affect and mood  Assessment and Plan: Neuropathic pain - Plan: venlafaxine XR (EFFEXOR XR) 37.5 MG 24 hr capsule  Chronic, uncontrolled.  He is using tramadol sparingly.  We will start venlafaxine daily  as above.  He has an appointment in early October.  If he wishes to try another medicine, I will see him in a month.  Consider topical capsaicin cream. Otherwise I will see him in 6 months for physical. The patient voiced understanding and agreement to the plan.  [place], DO 06/05/21  1:22 PM

## 2021-06-05 NOTE — Patient Instructions (Addendum)
Consider topical capsaicin cream for the feet.  Continue sparing use of tramadol.  Let me know if there are any cost issues.   If you want to try anything new prior to your neurology appointment, I will plan to see you in a month.   Let us know if you need anything.

## 2021-06-18 ENCOUNTER — Ambulatory Visit: Payer: Medicare Other | Attending: Internal Medicine

## 2021-06-18 DIAGNOSIS — Z23 Encounter for immunization: Secondary | ICD-10-CM

## 2021-06-18 NOTE — Progress Notes (Signed)
   Covid-19 Vaccination Clinic  Name:  Alex Dunn    MRN: [id number] DOB: 07-19-1936  06/18/2021  [name] was observed post Covid-19 immunization for 15 minutes without incident. He was provided with Vaccine Information Sheet and instruction to access the V-Safe system.   [name] was instructed to call 911 with any severe reactions post vaccine: Difficulty breathing  Swelling of face and throat  A fast heartbeat  A bad rash all over body  Dizziness and weakness   Immunizations Administered     Name Date Dose VIS Date Route   PFIZER Comrnaty(Gray TOP) Covid-19 Vaccine 06/18/2021  1:27 PM 0.3 mL 10/12/2020 Intramuscular   Manufacturer: Bud   Lot: [id number]   NDC: [id number]

## 2021-06-25 ENCOUNTER — Other Ambulatory Visit (HOSPITAL_BASED_OUTPATIENT_CLINIC_OR_DEPARTMENT_OTHER): Payer: Self-pay

## 2021-06-25 MED ORDER — COVID-19 MRNA VAC-TRIS(PFIZER) 30 MCG/0.3ML IM SUSP
INTRAMUSCULAR | 0 refills | Status: DC
  Filled 2021-06-25: qty 0.3, 1d supply, fill #0

## 2021-06-26 DIAGNOSIS — M5416 Radiculopathy, lumbar region: Secondary | ICD-10-CM | POA: Diagnosis not present

## 2021-06-26 DIAGNOSIS — M5136 Other intervertebral disc degeneration, lumbar region: Secondary | ICD-10-CM | POA: Diagnosis not present

## 2021-06-26 DIAGNOSIS — M48062 Spinal stenosis, lumbar region with neurogenic claudication: Secondary | ICD-10-CM | POA: Diagnosis not present

## 2021-06-26 DIAGNOSIS — M5030 Other cervical disc degeneration, unspecified cervical region: Secondary | ICD-10-CM | POA: Diagnosis not present

## 2021-07-18 ENCOUNTER — Encounter: Payer: Self-pay | Admitting: Family Medicine

## 2021-07-18 ENCOUNTER — Ambulatory Visit (INDEPENDENT_AMBULATORY_CARE_PROVIDER_SITE_OTHER): Payer: Medicare Other | Admitting: Family Medicine

## 2021-07-18 ENCOUNTER — Other Ambulatory Visit: Payer: Self-pay

## 2021-07-18 VITALS — BP 130/68 | HR 71 | Temp 98.2°F | Ht 72.0 in | Wt 194.1 lb

## 2021-07-18 DIAGNOSIS — H6121 Impacted cerumen, right ear: Secondary | ICD-10-CM | POA: Diagnosis not present

## 2021-07-18 NOTE — Patient Instructions (Signed)
OK to use Debrox (peroxide) in the ear to loosen up wax. Also recommend using a bulb syringe (for removing boogers from baby's noses) to flush through warm water and vinegar (3-4:1 ratio). An alternative, though more expensive, is an elephant ear washer wax removal kit. Do not use Q-tips as this can impact wax further.  Let us know if you need anything.

## 2021-07-18 NOTE — Progress Notes (Signed)
Chief Complaint  Patient presents with   Ear Fullness    Subjective: Patient is a 85 y.o. male here for R ear fullness and decreased hearing.  3-4 d ago, started having above s/s's. He has slight pain. Tried using OTC wax removal solution without relief. He did use a Q tip to remove some wax.  Patient does have history of wax buildup.  No injury to his head, fevers, discharge/bleeding from ear.  Past Medical History:  Diagnosis Date   Abdominal aortic atherosclerosis ([place]) 04/15/2018   ANEMIA-IRON DEFICIENCY 06/23/2007   Ataxia 02/14/2020   B12 deficiency 06/13/2020   Balance problem 01/10/2020   BENIGN PROSTATIC HYPERTROPHY 06/23/2007   Bilateral leg pain 02/14/2020   BPH (benign prostatic hyperplasia) 06/23/2007   Qualifier: Diagnosis of  By: Paulina Fusi, RN, Daine Gravel   Formatting of this note might be different from the original. Formatting of this note might be different from the original. Qualifier: Diagnosis of  By: Paulina Fusi, RN, Daine Gravel   Chronic kidney disease 10/18/2016   Degeneration of lumbar intervertebral disc 06/30/2018   Diverticulosis    Elevated diaphragm 04/15/2018   Essential hypertension 04/15/2018   Gastro-esophageal reflux disease without esophagitis 06/23/2007   Qualifier: Diagnosis of  By: Paulina Fusi, RN, Daine Gravel   Formatting of this note might be different from the original. Formatting of this note might be different from the original. Qualifier: Diagnosis of  By: Paulina Fusi, RN, Daine Gravel   Hypertension    Low back pain 03/09/2019   Lumbar radiculopathy 05/06/2019   Lumbosacral spondylosis without myelopathy 03/10/2019   MGUS (monoclonal gammopathy of unknown significance)    Monoclonal gammopathy of unknown significance (MGUS) 08/24/2018   Multiple renal cysts 04/06/2018   Night sweats 03/13/2018   Formatting of this note might be different from the original. Last Assessment & Plan:  Formatting of this note might be different from the original. It appears as if these have resolved now but were  part of the constellation of presenting symptoms. Could reflect infectious, metabolic/endocrine, autoimmune process contributing.   Nonsustained ventricular tachycardia ([place]) 11/03/2019   Polycystic kidney disease    stage 3 per wife   Primary osteoarthritis of both knees 06/30/2018   Bilateral moderate with moderate chondromalacia patella Formatting of this note might be different from the original. Formatting of this note might be different from the original. Bilateral moderate with moderate chondromalacia patella   Stage 3b chronic kidney disease ([place]) 06/21/2020   Weakness of both lower extremities 01/10/2020    Objective: BP 130/68   Pulse 71   Temp 98.2 F (36.8 C) (Oral)   Ht 6' (1.829 m)   Wt 194 lb 2 oz (88.1 kg)   SpO2 98%   BMI 26.33 kg/m  General: Awake, appears stated age HEENT: Right canal 100% obstructed with cerumen, left canal 60% obstructed. Lungs: No accessory muscle use Psych: Age appropriate judgment and insight, normal affect and mood  Procedure note: Cerumen removal irrigation Verbal consent obtained. Robin Ewing, CMA performed procedure. A mixture of warm water and Dulcolax was used to irrigate ear. I personally used a translucent curette with a light source to remove some wax. Most of cerumen successfully removed. Pt reported immediate improvement. Pt tolerated procedure well. There were no immediate complications noted.  Assessment and Plan: Impacted cerumen of right ear  Removal today.  Home management options provided in his paperwork.  Follow-up as originally scheduled. The patient voiced understanding and agreement to the plan.  Crosby Oyster [place],  DO 07/18/21  3:00 PM

## 2021-08-07 DIAGNOSIS — G629 Polyneuropathy, unspecified: Secondary | ICD-10-CM | POA: Diagnosis not present

## 2021-08-07 DIAGNOSIS — R2990 Unspecified symptoms and signs involving the nervous system: Secondary | ICD-10-CM | POA: Diagnosis not present

## 2021-08-07 DIAGNOSIS — E538 Deficiency of other specified B group vitamins: Secondary | ICD-10-CM | POA: Diagnosis not present

## 2021-08-07 DIAGNOSIS — M810 Age-related osteoporosis without current pathological fracture: Secondary | ICD-10-CM | POA: Diagnosis not present

## 2021-08-07 DIAGNOSIS — R270 Ataxia, unspecified: Secondary | ICD-10-CM | POA: Diagnosis not present

## 2021-08-08 ENCOUNTER — Other Ambulatory Visit: Payer: Self-pay | Admitting: Neurology

## 2021-08-08 ENCOUNTER — Other Ambulatory Visit (HOSPITAL_COMMUNITY): Payer: Self-pay | Admitting: Neurology

## 2021-08-08 DIAGNOSIS — R270 Ataxia, unspecified: Secondary | ICD-10-CM

## 2021-08-08 DIAGNOSIS — R2990 Unspecified symptoms and signs involving the nervous system: Secondary | ICD-10-CM

## 2021-08-09 DIAGNOSIS — N1832 Chronic kidney disease, stage 3b: Secondary | ICD-10-CM | POA: Diagnosis not present

## 2021-08-15 DIAGNOSIS — Q619 Cystic kidney disease, unspecified: Secondary | ICD-10-CM | POA: Diagnosis not present

## 2021-08-15 DIAGNOSIS — N1832 Chronic kidney disease, stage 3b: Secondary | ICD-10-CM | POA: Diagnosis not present

## 2021-08-15 DIAGNOSIS — I129 Hypertensive chronic kidney disease with stage 1 through stage 4 chronic kidney disease, or unspecified chronic kidney disease: Secondary | ICD-10-CM | POA: Diagnosis not present

## 2021-08-16 ENCOUNTER — Ambulatory Visit: Payer: Medicare Other

## 2021-08-20 ENCOUNTER — Other Ambulatory Visit: Payer: Self-pay

## 2021-08-20 ENCOUNTER — Ambulatory Visit
Admission: RE | Admit: 2021-08-20 | Discharge: 2021-08-20 | Disposition: A | Discharge status: Home / Self Care | Payer: Medicare Other | Source: Ambulatory Visit | Attending: Neurology | Admitting: Neurology

## 2021-08-20 DIAGNOSIS — R2990 Unspecified symptoms and signs involving the nervous system: Secondary | ICD-10-CM | POA: Insufficient documentation

## 2021-08-20 DIAGNOSIS — R270 Ataxia, unspecified: Secondary | ICD-10-CM | POA: Insufficient documentation

## 2021-08-20 DIAGNOSIS — G319 Degenerative disease of nervous system, unspecified: Secondary | ICD-10-CM | POA: Diagnosis not present

## 2021-08-24 ENCOUNTER — Other Ambulatory Visit: Payer: Self-pay

## 2021-08-24 ENCOUNTER — Ambulatory Visit (INDEPENDENT_AMBULATORY_CARE_PROVIDER_SITE_OTHER): Payer: Medicare Other

## 2021-08-24 DIAGNOSIS — Z23 Encounter for immunization: Secondary | ICD-10-CM | POA: Diagnosis not present

## 2021-08-24 NOTE — Progress Notes (Signed)
Pt is today for flu vaccine. Pt was given flu vaccine in left deltoid. Pt tolerated well

## 2021-09-04 ENCOUNTER — Ambulatory Visit (INDEPENDENT_AMBULATORY_CARE_PROVIDER_SITE_OTHER): Payer: Medicare Other | Admitting: Nurse Practitioner

## 2021-09-04 ENCOUNTER — Other Ambulatory Visit: Payer: Self-pay

## 2021-09-04 ENCOUNTER — Encounter (INDEPENDENT_AMBULATORY_CARE_PROVIDER_SITE_OTHER): Payer: Self-pay | Admitting: Nurse Practitioner

## 2021-09-04 VITALS — BP 159/74 | HR 75 | Resp 16 | Ht 72.0 in | Wt 197.2 lb

## 2021-09-04 DIAGNOSIS — M79604 Pain in right leg: Secondary | ICD-10-CM

## 2021-09-04 DIAGNOSIS — I10 Essential (primary) hypertension: Secondary | ICD-10-CM

## 2021-09-04 DIAGNOSIS — M79605 Pain in left leg: Secondary | ICD-10-CM

## 2021-09-04 DIAGNOSIS — M5416 Radiculopathy, lumbar region: Secondary | ICD-10-CM | POA: Diagnosis not present

## 2021-09-05 ENCOUNTER — Telehealth (INDEPENDENT_AMBULATORY_CARE_PROVIDER_SITE_OTHER): Payer: Self-pay

## 2021-09-05 NOTE — Telephone Encounter (Signed)
Placed a call to patient about rescheduling his appt on 09/18/21 due to provider being out of office in the afternoon. Afford patient a morning appt. Patient declined and stated he didn't know he had a visit on 09/18/21 and that would be to early for him. He also expressed that he didn't know why he was having another visit because "the provider didn't know what was wrong with him and didn't understand why he needed to come back, as well as wanting to know was this visit approved by the insurance". Let patient know I think visit would be covered with insurance that an ultrasound would be done to help determine what is going on and how to move forward with helping the patient.  He then expressed that he wanted to go to the [place] location because he lives in [place] and this was to far     Please advise

## 2021-09-05 NOTE — Telephone Encounter (Signed)
WE saw the patient yesterday and explained to the patient that his neurologist, Dr. Manuella Ghazi, didn't request to have any studies done when he sent the referral.  Unfortunately, without the studies, we cannot definitively say what the issue is, which is why we scheduled him to come in for the studies that are necessary to determine if the pain he is having is related to a vascular issue.  If the patient still wishes to have a work up done, we can send him to the vascular specialists in [place].  However, if he doesn't wish to have any work up done we can simply cancel the appointment

## 2021-09-05 NOTE — Telephone Encounter (Signed)
Spoke with the patient and gave him the recommendation from Eulogio Ditch NP and patient wanted to reschedule his appt in our office.Marland Kitchen

## 2021-09-06 ENCOUNTER — Inpatient Hospital Stay: Payer: Medicare Other | Attending: Hematology & Oncology

## 2021-09-06 ENCOUNTER — Other Ambulatory Visit: Payer: Self-pay

## 2021-09-06 ENCOUNTER — Inpatient Hospital Stay (HOSPITAL_BASED_OUTPATIENT_CLINIC_OR_DEPARTMENT_OTHER): Payer: Medicare Other | Admitting: Hematology & Oncology

## 2021-09-06 ENCOUNTER — Encounter: Payer: Self-pay | Admitting: Hematology & Oncology

## 2021-09-06 VITALS — BP 149/66 | HR 68 | Temp 98.4°F | Resp 18 | Ht 72.0 in | Wt 196.1 lb

## 2021-09-06 DIAGNOSIS — G629 Polyneuropathy, unspecified: Secondary | ICD-10-CM | POA: Diagnosis not present

## 2021-09-06 DIAGNOSIS — D472 Monoclonal gammopathy: Secondary | ICD-10-CM

## 2021-09-06 DIAGNOSIS — Z862 Personal history of diseases of the blood and blood-forming organs and certain disorders involving the immune mechanism: Secondary | ICD-10-CM | POA: Insufficient documentation

## 2021-09-06 DIAGNOSIS — D509 Iron deficiency anemia, unspecified: Secondary | ICD-10-CM

## 2021-09-06 LAB — CMP (CANCER CENTER ONLY)
ALT: 10 U/L (ref 0–44)
AST: 13 U/L — ABNORMAL LOW (ref 15–41)
Albumin: 3.9 g/dL (ref 3.5–5.0)
Alkaline Phosphatase: 88 U/L (ref 38–126)
Anion gap: 6 (ref 5–15)
BUN: 26 mg/dL — ABNORMAL HIGH (ref 8–23)
CO2: 29 mmol/L (ref 22–32)
Calcium: 9.7 mg/dL (ref 8.9–10.3)
Chloride: 103 mmol/L (ref 98–111)
Creatinine: 1.87 mg/dL — ABNORMAL HIGH (ref 0.61–1.24)
GFR, Estimated: 35 mL/min — ABNORMAL LOW (ref >60)
Glucose, Bld: 91 mg/dL (ref 70–99)
Potassium: 4.6 mmol/L (ref 3.5–5.1)
Sodium: 138 mmol/L (ref 135–145)
Total Bilirubin: 0.5 mg/dL (ref 0.3–1.2)
Total Protein: 6.8 g/dL (ref 6.5–8.1)

## 2021-09-06 LAB — CBC WITH DIFFERENTIAL (CANCER CENTER ONLY)
Abs Immature Granulocytes: 0.03 10*3/uL (ref 0.00–0.07)
Basophils Absolute: 0 10*3/uL (ref 0.0–0.1)
Basophils Relative: 1 %
Eosinophils Absolute: 0.1 10*3/uL (ref 0.0–0.5)
Eosinophils Relative: 2 %
HCT: 43.5 % (ref 39.0–52.0)
Hemoglobin: 14.1 g/dL (ref 13.0–17.0)
Immature Granulocytes: 1 %
Lymphocytes Relative: 22 %
Lymphs Abs: 1.4 10*3/uL (ref 0.7–4.0)
MCH: 31.5 pg (ref 26.0–34.0)
MCHC: 32.4 g/dL (ref 30.0–36.0)
MCV: 97.1 fL (ref 80.0–100.0)
Monocytes Absolute: 0.6 10*3/uL (ref 0.1–1.0)
Monocytes Relative: 9 %
Neutro Abs: 4.2 10*3/uL (ref 1.7–7.7)
Neutrophils Relative %: 65 %
Platelet Count: 175 10*3/uL (ref 150–400)
RBC: 4.48 MIL/uL (ref 4.22–5.81)
RDW: 12.7 % (ref 11.5–15.5)
WBC Count: 6.4 10*3/uL (ref 4.0–10.5)
nRBC: 0 % (ref 0.0–0.2)

## 2021-09-06 NOTE — Progress Notes (Signed)
Hematology and Oncology Follow Up Visit  Alex Dunn [id number] Dec 07, 1935 85 y.o. 09/06/2021   Principle Diagnosis:  IgA kappa MGUS   Current Therapy:        Observation   Interim History:  Alex Dunn is here today with his wife for follow-up.  Unfortunately, he is having more problems with respect to neuropathy.  I think he saw a neurologist recently.  He did have some nerve conduction studies done.  Apparently is up from what his wife says he has severe peripheral neuropathy.  I just have a hard time believing this is secondary to the IgA kappa MGUS.  When he was last here, there is no monoclonal spike in his blood.  I just wonder if this is not coming from his back.  I wonder if he has spinal stenosis.  He says his numbness is worse when he walks.  When he sits down or lies down, the numbness seems to get better.  Again, he is being followed by neurology.  I think supposed to be seeing a vascular doctor.  Apparently there was some issue of whether or not he had a stroke.  I am unsure if this really was the case.  He has had no problems with COVID.  He has had no nausea or vomiting.  His appetite is good.  He has had no change in bowel or bladder habits.  He has had no rashes.  Currently, I would say his performance status is probably ECOG 2.    Medications:  Allergies as of 09/06/2021       Reactions   Codeine Phosphate Other (See Comments)   Seen odd things crawling up wall   Pregabalin Other (See Comments)   Drowsy, nightmares, stumbling   Amitriptyline Other (See Comments)   SLEPT FOR 18-19 HOURS STRAIGHT   Lexapro [escitalopram Oxalate] Other (See Comments)   shaking        Medication List        Accurate as of September 06, 2021  1:03 PM. If you have any questions, ask your nurse or doctor.          STOP taking these medications    Pfizer-BioNT COVID-19 Vac-TriS Susp injection Generic drug: COVID-19 mRNA Vac-TriS Therapist, music) Stopped by: Volanda Napoleon, MD       TAKE these medications    aspirin EC 81 MG tablet Take 81 mg by mouth daily. Swallow whole.   cyanocobalamin 1000 MCG tablet Take by mouth.   flecainide 50 MG tablet Commonly known as: TAMBOCOR Take 1 tablet (50 mg total) by mouth 2 (two) times daily.   ICAPS AREDS 2 PO Take 1 each by mouth daily.   losartan 50 MG tablet Commonly known as: COZAAR Take 1 tablet (50 mg total) by mouth daily.   pantoprazole 40 MG tablet Commonly known as: PROTONIX Take 1 tablet (40 mg total) by mouth daily as needed.   traMADol 50 MG tablet Commonly known as: ULTRAM TAKE 1 TABLET(50 MG) BY MOUTH EVERY 12 HOURS AS NEEDED FOR MODERATE PAIN   TYLENOL PO Take by mouth as needed (pain).   venlafaxine XR 37.5 MG 24 hr capsule Commonly known as: Effexor XR Take 1 capsule (37.5 mg total) by mouth daily with breakfast.   VITAMIN D PO Take 2,000 Int'l Units by mouth daily.        Allergies:  Allergies  Allergen Reactions   Codeine Phosphate Other (See Comments)    Seen odd things crawling up  wall   Pregabalin Other (See Comments)    Drowsy, nightmares, stumbling   Amitriptyline Other (See Comments)    SLEPT FOR 18-19 HOURS STRAIGHT   Lexapro [Escitalopram Oxalate] Other (See Comments)    shaking    Past Medical History, Surgical history, Social history, and Family History were reviewed and updated.  Review of Systems: All other 10 point review of systems is negative.   Physical Exam:  height is 6' (1.829 m) and weight is 196 lb 1.3 oz (88.9 kg). His oral temperature is 98.4 F (36.9 C). His blood pressure is 149/66 (abnormal) and his pulse is 68. His respiration is 18 and oxygen saturation is 99%.   Wt Readings from Last 3 Encounters:  09/06/21 196 lb 1.3 oz (88.9 kg)  09/04/21 197 lb 3.2 oz (89.4 kg)  07/18/21 194 lb 2 oz (88.1 kg)    Ocular: Sclerae unicteric, pupils equal, round and reactive to light Ear-nose-throat: Oropharynx clear, dentition  fair Lymphatic: No cervical or supraclavicular adenopathy Lungs no rales or rhonchi, good excursion bilaterally Heart regular rate and rhythm, no murmur appreciated Abd soft, nontender, positive bowel sounds MSK no focal spinal tenderness, no joint edema Neuro: non-focal, well-oriented, appropriate affect Breasts: Deferred   Lab Results  Component Value Date   WBC 6.4 09/06/2021   HGB 14.1 09/06/2021   HCT 43.5 09/06/2021   MCV 97.1 09/06/2021   PLT 175 09/06/2021   Lab Results  Component Value Date   FERRITIN 61 03/06/2021   IRON 74 03/06/2021   TIBC 331 03/06/2021   UIBC 257 03/06/2021   IRONPCTSAT 22 03/06/2021   Lab Results  Component Value Date   RETICCTPCT 0.8 08/12/2018   RBC 4.48 09/06/2021   Lab Results  Component Value Date   KPAFRELGTCHN 53.9 (H) 03/06/2021   LAMBDASER 22.9 03/06/2021   KAPLAMBRATIO 2.35 (H) 03/06/2021   Lab Results  Component Value Date   IGGSERUM 829 03/06/2021   IGA 513 (H) 03/06/2021   IGMSERUM 48 03/06/2021   Lab Results  Component Value Date   TOTALPROTELP 6.1 03/06/2021   ALBUMINELP 3.4 03/06/2021   A1GS 0.2 03/06/2021   A2GS 0.7 03/06/2021   BETS 1.0 03/06/2021   GAMS 0.8 03/06/2021   MSPIKE Not Observed 03/06/2021   SPEI Comment 03/06/2021     Chemistry      Component Value Date/Time   NA 138 09/06/2021 1150   NA 142 07/01/2019 1154   K 4.6 09/06/2021 1150   CL 103 09/06/2021 1150   CO2 29 09/06/2021 1150   BUN 26 (H) 09/06/2021 1150   BUN 25 07/01/2019 1154   CREATININE 1.87 (H) 09/06/2021 1150   CREATININE 1.61 (H) 02/26/2019 1511      Component Value Date/Time   CALCIUM 9.7 09/06/2021 1150   ALKPHOS 88 09/06/2021 1150   AST 13 (L) 09/06/2021 1150   ALT 10 09/06/2021 1150   BILITOT 0.5 09/06/2021 1150       Impression and Plan: Alex Dunn is a very pleasant 85yo caucasian gentleman with IgA kappa MGUS.  His biggest problem is this neuropathy.  I am unsure exactly how we can help with this.  Again we  will have to see what the monoclonal studies show.  He did had a bone marrow test done 3 years ago.  This showed 8 percent plasma cells.  I suppose we could always do another bone marrow test on him if his monoclonal studies are significantly different.  We will check B12 levels on  him.  Again I would think that a MRI of the back might be helpful.  Again, to me, it sounds like he has spinal stenosis.  I would like to see him back after the holidays.  I think we have to see him back a little more often but just because of his symptoms.     Volanda Napoleon, MD 11/3/20221:03 PM

## 2021-09-07 LAB — IGG, IGA, IGM
IgA: 545 mg/dL — ABNORMAL HIGH (ref 61–437)
IgG (Immunoglobin G), Serum: 1004 mg/dL (ref 603–1613)
IgM (Immunoglobulin M), Srm: 56 mg/dL (ref 15–143)

## 2021-09-07 LAB — KAPPA/LAMBDA LIGHT CHAINS
Kappa free light chain: 62.4 mg/L — ABNORMAL HIGH (ref 3.3–19.4)
Kappa, lambda light chain ratio: 2.61 — ABNORMAL HIGH (ref 0.26–1.65)
Lambda free light chains: 23.9 mg/L (ref 5.7–26.3)

## 2021-09-07 LAB — IRON AND TIBC
Iron: 87 ug/dL (ref 42–163)
Saturation Ratios: 28 % (ref 20–55)
TIBC: 310 ug/dL (ref 202–409)
UIBC: 223 ug/dL (ref 117–376)

## 2021-09-07 LAB — FERRITIN: Ferritin: 72 ng/mL (ref 24–336)

## 2021-09-10 ENCOUNTER — Encounter (INDEPENDENT_AMBULATORY_CARE_PROVIDER_SITE_OTHER): Payer: Self-pay | Admitting: Nurse Practitioner

## 2021-09-10 LAB — PROTEIN ELECTROPHORESIS, SERUM
A/G Ratio: 1.1 (ref 0.7–1.7)
Albumin ELP: 3.4 g/dL (ref 2.9–4.4)
Alpha-1-Globulin: 0.2 g/dL (ref 0.0–0.4)
Alpha-2-Globulin: 0.8 g/dL (ref 0.4–1.0)
Beta Globulin: 1.3 g/dL (ref 0.7–1.3)
Gamma Globulin: 0.8 g/dL (ref 0.4–1.8)
Globulin, Total: 3.1 g/dL (ref 2.2–3.9)
Total Protein ELP: 6.5 g/dL (ref 6.0–8.5)

## 2021-09-10 NOTE — Progress Notes (Signed)
Subjective:    Patient ID: Alex Dunn, male    DOB: 05-18-1936, 85 y.o.   MRN: [id number] Chief Complaint  Patient presents with   New Patient (Initial Visit)    Ref Manuella Ghazi asymptomatic varicose veins    Alex Dunn is a 85 y.o. male, that presents today as a referral from his neurologist Dr. Manuella Ghazi in regards to leg pain.  He notes that the pain is worse in his right lower extremity.  He has pain along the posterior lateral aspect of his right lower extremity that is worse when he goes from lying to sitting or from sitting to standing.  He also notes that when he first wakes his legs tend to be the worst.  The pain is present from his knee down bilaterally.  His description of pain is not quite consistent with claudication.  He notes that after walking certain distances is not necessarily his legs but his whole body tends to give out.  He denies any rest pain.  There are no new wounds or ulcerations.  He does have a history of lumbar disc disease with some radiculopathy.  His neurologist is also diagnosed with neuropathy as well.  He has previously had ABIs done but there were monophasic waveforms noted in the posterior tibial arteries.  He also had an aortoiliac duplex done that was not consistent with any significant stenosis.    Review of Systems  Musculoskeletal:  Positive for gait problem.  All other systems reviewed and are negative.     Objective:   Physical Exam Vitals reviewed.  HENT:     Head: Normocephalic.  Cardiovascular:     Rate and Rhythm: Normal rate.     Pulses: Decreased pulses.  Pulmonary:     Effort: Pulmonary effort is normal.  Skin:    General: Skin is warm and dry.  Neurological:     Mental Status: He is alert and oriented to person, place, and time.     Gait: Gait abnormal.  Psychiatric:        Mood and Affect: Mood normal.        Behavior: Behavior normal.        Thought Content: Thought content normal.        Judgment: Judgment normal.     BP (!) 159/74 (BP Location: Right Arm)   Pulse 75   Resp 16   Ht 6' (1.829 m)   Wt 197 lb 3.2 oz (89.4 kg)   BMI 26.75 kg/m   Past Medical History:  Diagnosis Date   Abdominal aortic atherosclerosis ([place]) 04/15/2018   ANEMIA-IRON DEFICIENCY 06/23/2007   Ataxia 02/14/2020   B12 deficiency 06/13/2020   Balance problem 01/10/2020   BENIGN PROSTATIC HYPERTROPHY 06/23/2007   Bilateral leg pain 02/14/2020   BPH (benign prostatic hyperplasia) 06/23/2007   Qualifier: Diagnosis of  By: Paulina Fusi, RN, Daine Gravel   Formatting of this note might be different from the original. Formatting of this note might be different from the original. Qualifier: Diagnosis of  By: Paulina Fusi RN, Daine Gravel   Chronic kidney disease 10/18/2016   Degeneration of lumbar intervertebral disc 06/30/2018   Diverticulosis    Elevated diaphragm 04/15/2018   Essential hypertension 04/15/2018   Gastro-esophageal reflux disease without esophagitis 06/23/2007   Qualifier: Diagnosis of  By: Paulina Fusi, RN, Daine Gravel   Formatting of this note might be different from the original. Formatting of this note might be different from the original. Qualifier: Diagnosis of  By: Paulina Fusi,  RN, Daine Gravel   Hypertension    Low back pain 03/09/2019   Lumbar radiculopathy 05/06/2019   Lumbosacral spondylosis without myelopathy 03/10/2019   MGUS (monoclonal gammopathy of unknown significance)    Monoclonal gammopathy of unknown significance (MGUS) 08/24/2018   Multiple renal cysts 04/06/2018   Night sweats 03/13/2018   Formatting of this note might be different from the original. Last Assessment & Plan:  Formatting of this note might be different from the original. It appears as if these have resolved now but were part of the constellation of presenting symptoms. Could reflect infectious, metabolic/endocrine, autoimmune process contributing.   Nonsustained ventricular tachycardia 11/03/2019   Polycystic kidney disease    stage 3 per wife   Primary osteoarthritis of both  knees 06/30/2018   Bilateral moderate with moderate chondromalacia patella Formatting of this note might be different from the original. Formatting of this note might be different from the original. Bilateral moderate with moderate chondromalacia patella   Stage 3b chronic kidney disease ([place]) 06/21/2020   Weakness of both lower extremities 01/10/2020    Social History   Socioeconomic History   Marital status: Married    Spouse name: Not on file   Number of children: 1   Years of education: Not on file   Highest education level: High school graduate  Occupational History   Not on file  Tobacco Use   Smoking status: Never   Smokeless tobacco: Never  Vaping Use   Vaping Use: Never used  Substance and Sexual Activity   Alcohol use: Never   Drug use: Never   Sexual activity: Not on file  Other Topics Concern   Not on file  Social History Narrative   Lives at home with his wife   Retired   Right handed   Caffeine: about 2 cups daily   Social Determinants of Health   Financial Resource Strain: Not on file  Food Insecurity: Not on file  Transportation Needs: Not on file  Physical Activity: Not on file  Stress: Not on file  Social Connections: Not on file  Intimate Partner Violence: Not on file    Past Surgical History:  Procedure Laterality Date   HAND SURGERY     LUNG SURGERY     for collapsed lung    Family History  Problem Relation Age of Onset   Heart attack Mother    Heart attack Father    Heart attack Brother        "leaky valve"   Neuromuscular disorder Neg Hx     Allergies  Allergen Reactions   Codeine Phosphate Other (See Comments)    Seen odd things crawling up wall   Pregabalin Other (See Comments)    Drowsy, nightmares, stumbling   Amitriptyline Other (See Comments)    SLEPT FOR 18-19 HOURS STRAIGHT   Lexapro [Escitalopram Oxalate] Other (See Comments)    shaking    CBC Latest Ref Rng & Units 09/06/2021 04/01/2021 03/06/2021  WBC 4.0 - 10.5 K/uL  6.4 7.9 5.9  Hemoglobin 13.0 - 17.0 g/dL 14.1 14.9 13.4  Hematocrit 39.0 - 52.0 % 43.5 46.4 40.6  Platelets 150 - 400 K/uL 175 240 173      CMP     Component Value Date/Time   NA 138 09/06/2021 1150   NA 142 07/01/2019 1154   K 4.6 09/06/2021 1150   CL 103 09/06/2021 1150   CO2 29 09/06/2021 1150   GLUCOSE 91 09/06/2021 1150   BUN 26 (H) 09/06/2021  1150   BUN 25 07/01/2019 1154   CREATININE 1.87 (H) 09/06/2021 1150   CREATININE 1.61 (H) 02/26/2019 1511   CALCIUM 9.7 09/06/2021 1150   PROT 6.8 09/06/2021 1150   PROT 6.6 07/20/2018 0944   ALBUMIN 3.9 09/06/2021 1150   AST 13 (L) 09/06/2021 1150   ALT 10 09/06/2021 1150   ALKPHOS 88 09/06/2021 1150   BILITOT 0.5 09/06/2021 1150   GFRNONAA 35 (L) 09/06/2021 1150   GFRAA 48 (L) 04/27/2020 1750   GFRAA 39 (L) 03/07/2020 1250     No results found.     Assessment & Plan:   1. Bilateral leg pain The patient ABIs previously were nearly normal.  There is no significant stenosis with aortic iliac level.  However this work-up was approximately 2 years ago.  We will order ABIs and repeat studies to see if there are changes that may account for the patient's continued lower extremity pain.  We will have the patient follow-up at his convenience.  2. Lumbar radiculopathy This may be a possible component of the patient's pain  3. Essential hypertension Continue antihypertensive medications as already ordered, these medications have been reviewed and there are no changes at this time.    Current Outpatient Medications on File Prior to Visit  Medication Sig Dispense Refill   Acetaminophen (TYLENOL PO) Take by mouth as needed (pain).     aspirin EC 81 MG tablet Take 81 mg by mouth daily. Swallow whole.     cyanocobalamin 1000 MCG tablet Take by mouth.     flecainide (TAMBOCOR) 50 MG tablet Take 1 tablet (50 mg total) by mouth 2 (two) times daily. 180 tablet 3   Multiple Vitamins-Minerals (ICAPS AREDS 2 PO) Take 1 each by mouth  daily.     pantoprazole (PROTONIX) 40 MG tablet Take 1 tablet (40 mg total) by mouth daily as needed. 90 tablet 3   traMADol (ULTRAM) 50 MG tablet TAKE 1 TABLET(50 MG) BY MOUTH EVERY 12 HOURS AS NEEDED FOR MODERATE PAIN 90 tablet 1   venlafaxine XR (EFFEXOR XR) 37.5 MG 24 hr capsule Take 1 capsule (37.5 mg total) by mouth daily with breakfast. 30 capsule 2   VITAMIN D PO Take 2,000 Int'l Units by mouth daily.     losartan (COZAAR) 50 MG tablet Take 1 tablet (50 mg total) by mouth daily. 90 tablet 3   Current Facility-Administered Medications on File Prior to Visit  Medication Dose Route Frequency Provider Last Rate Last Admin   cyanocobalamin ((VITAMIN B-12)) injection 1,000 mcg  1,000 mcg Intramuscular Q30 days Shelda Pal, DO   1,000 mcg at 12/21/20 1410    There are no Patient Instructions on file for this visit. No follow-ups on file.   Kris Hartmann, NP

## 2021-09-14 ENCOUNTER — Other Ambulatory Visit: Payer: Self-pay

## 2021-09-14 ENCOUNTER — Other Ambulatory Visit: Payer: Self-pay | Admitting: Family Medicine

## 2021-09-14 ENCOUNTER — Ambulatory Visit (INDEPENDENT_AMBULATORY_CARE_PROVIDER_SITE_OTHER): Payer: Medicare Other | Admitting: Family Medicine

## 2021-09-14 ENCOUNTER — Encounter: Payer: Self-pay | Admitting: Family Medicine

## 2021-09-14 VITALS — BP 130/70 | HR 69 | Temp 98.9°F | Ht 72.0 in | Wt 197.1 lb

## 2021-09-14 DIAGNOSIS — Z8673 Personal history of transient ischemic attack (TIA), and cerebral infarction without residual deficits: Secondary | ICD-10-CM | POA: Diagnosis not present

## 2021-09-14 DIAGNOSIS — G8929 Other chronic pain: Secondary | ICD-10-CM

## 2021-09-14 DIAGNOSIS — M545 Low back pain, unspecified: Secondary | ICD-10-CM

## 2021-09-14 MED ORDER — ATORVASTATIN CALCIUM 10 MG PO TABS: 10.0000 mg | ORAL_TABLET | Freq: Every day | ORAL | 3 refills | Status: DC

## 2021-09-14 NOTE — Telephone Encounter (Signed)
Last OV--07/18/2021 Last RF--#90 with 1 refill on 02/19/2021

## 2021-09-14 NOTE — Progress Notes (Signed)
Chief Complaint  Patient presents with   Follow-up    Discuss cholesterol medication      Subjective: Patient is a 85 y.o. male here for f/u.  He is here with his wife.  The patient has a history of chronic pain in his lower extremities.  He was receiving injections that were helping with the last 2 did not.  He was subsequently referred to [facility] neurology.  The neurologist evaluated him and thought he saw a facial droop.  This prompted ordering of an MRI of the brain that showed an old developing stroke.  He was started on a baby aspirin daily and told to follow-up with me to discuss starting a cholesterol-lowering medication.  He has never been on one before.  Last LDL was 113.  He has no recollection of having a stroke in the past.  He is concerned that if it did happen, it would have been in the last 3 years.  He does not feel he has a significant facial droop at this time.  Past Medical History:  Diagnosis Date   Abdominal aortic atherosclerosis ([place]) 04/15/2018   ANEMIA-IRON DEFICIENCY 06/23/2007   Ataxia 02/14/2020   B12 deficiency 06/13/2020   Balance problem 01/10/2020   BENIGN PROSTATIC HYPERTROPHY 06/23/2007   Bilateral leg pain 02/14/2020   BPH (benign prostatic hyperplasia) 06/23/2007   Qualifier: Diagnosis of  By: Paulina Fusi, RN, Daine Gravel   Formatting of this note might be different from the original. Formatting of this note might be different from the original. Qualifier: Diagnosis of  By: Paulina Fusi, RN, Daine Gravel   Chronic kidney disease 10/18/2016   Degeneration of lumbar intervertebral disc 06/30/2018   Diverticulosis    Elevated diaphragm 04/15/2018   Essential hypertension 04/15/2018   Gastro-esophageal reflux disease without esophagitis 06/23/2007   Qualifier: Diagnosis of  By: Paulina Fusi, RN, Daine Gravel   Formatting of this note might be different from the original. Formatting of this note might be different from the original. Qualifier: Diagnosis of  By: Paulina Fusi, RN, Daine Gravel   Hypertension     Low back pain 03/09/2019   Lumbar radiculopathy 05/06/2019   Lumbosacral spondylosis without myelopathy 03/10/2019   MGUS (monoclonal gammopathy of unknown significance)    Monoclonal gammopathy of unknown significance (MGUS) 08/24/2018   Multiple renal cysts 04/06/2018   Night sweats 03/13/2018   Formatting of this note might be different from the original. Last Assessment & Plan:  Formatting of this note might be different from the original. It appears as if these have resolved now but were part of the constellation of presenting symptoms. Could reflect infectious, metabolic/endocrine, autoimmune process contributing.   Nonsustained ventricular tachycardia 11/03/2019   Polycystic kidney disease    stage 3 per wife   Primary osteoarthritis of both knees 06/30/2018   Bilateral moderate with moderate chondromalacia patella Formatting of this note might be different from the original. Formatting of this note might be different from the original. Bilateral moderate with moderate chondromalacia patella   Stage 3b chronic kidney disease ([place]) 06/21/2020   Weakness of both lower extremities 01/10/2020    Objective: BP 130/70   Pulse 69   Temp 98.9 F (37.2 C) (Oral)   Ht 6' (1.829 m)   Wt 197 lb 2 oz (89.4 kg)   SpO2 97%   BMI 26.73 kg/m  General: Awake, appears stated age Heart: RRR, no bruits, no LE edema Lungs: CTAB, no rales, wheezes or rhonchi. No accessory muscle use Neuro: Gait is slow/steady,  I do not appreciate a facial droop today, speech is fluent and goal oriented Psych: Age appropriate judgment and insight, normal affect and mood  Assessment and Plan: History of CVA (cerebrovascular accident) - Plan: atorvastatin (LIPITOR) 10 MG tablet, Lipid panel, Comprehensive metabolic panel  Chronic, not adequately controlled.  Start Lipitor 10 mg daily.  Stay hydrated.  We will recheck labs in 6 weeks.  He has tended not to do well with medication side effects.  If this continues, will trial 5 mg  of rosuvastatin daily.  Ideally he would do well and we could increase the dosage slowly with a goal of 80 mg daily. The patient and his spouse voiced understanding and agreement to the plan.  [place], DO 09/14/21  4:05 PM

## 2021-09-14 NOTE — Patient Instructions (Signed)
Try to stay hydrated.  Stay on the aspirin.  Let us know if you need anything.

## 2021-09-18 ENCOUNTER — Encounter (INDEPENDENT_AMBULATORY_CARE_PROVIDER_SITE_OTHER): Payer: Medicare Other

## 2021-09-18 ENCOUNTER — Ambulatory Visit (INDEPENDENT_AMBULATORY_CARE_PROVIDER_SITE_OTHER): Payer: Medicare Other | Admitting: Nurse Practitioner

## 2021-10-02 ENCOUNTER — Other Ambulatory Visit (INDEPENDENT_AMBULATORY_CARE_PROVIDER_SITE_OTHER): Payer: Self-pay | Admitting: Nurse Practitioner

## 2021-10-02 DIAGNOSIS — M79604 Pain in right leg: Secondary | ICD-10-CM

## 2021-10-03 ENCOUNTER — Ambulatory Visit (INDEPENDENT_AMBULATORY_CARE_PROVIDER_SITE_OTHER): Payer: Medicare Other | Admitting: Nurse Practitioner

## 2021-10-03 ENCOUNTER — Other Ambulatory Visit: Payer: Self-pay

## 2021-10-03 ENCOUNTER — Ambulatory Visit (INDEPENDENT_AMBULATORY_CARE_PROVIDER_SITE_OTHER): Payer: Medicare Other

## 2021-10-03 VITALS — BP 157/71 | HR 77 | Ht 72.0 in | Wt 196.0 lb

## 2021-10-03 DIAGNOSIS — I10 Essential (primary) hypertension: Secondary | ICD-10-CM | POA: Diagnosis not present

## 2021-10-03 DIAGNOSIS — M79605 Pain in left leg: Secondary | ICD-10-CM | POA: Diagnosis not present

## 2021-10-03 DIAGNOSIS — M5416 Radiculopathy, lumbar region: Secondary | ICD-10-CM | POA: Diagnosis not present

## 2021-10-03 DIAGNOSIS — R29898 Other symptoms and signs involving the musculoskeletal system: Secondary | ICD-10-CM

## 2021-10-03 DIAGNOSIS — M79604 Pain in right leg: Secondary | ICD-10-CM

## 2021-10-14 ENCOUNTER — Encounter (INDEPENDENT_AMBULATORY_CARE_PROVIDER_SITE_OTHER): Payer: Self-pay | Admitting: Nurse Practitioner

## 2021-10-14 NOTE — Progress Notes (Signed)
Subjective:    Patient ID: Alex Dunn, male    DOB: 1936/01/25, 85 y.o.   MRN: [id number] Chief Complaint  Patient presents with   Follow-up    Pt conv ABI w/toe lifts     Alex Dunn is a 85 year old male that returns today for follow-up evaluation of noninvasive studies due to lower extremity pain.  The patient notes pain in his back as well as his ankles and feet that are persistently down.  He notes that it is at its worst when he is standing in place and that at times walking improves the pain.  He has been previously told that he has neuropathy.  The pain that he describes is not quite consistent with claudication symptoms.  He denies any open wounds or ulcerations.  He denies any pain consistent with rest pain.  Today noninvasive studies show a right ABI 1.16 on the left and 1.19.  He has a TBI 0.82 on the right and 0.4 on the left.  He has triphasic/biphasic waveforms on the right and triphasic on the left.  Good toe waveforms bilaterally.   Review of Systems  Musculoskeletal:  Positive for arthralgias and myalgias.  All other systems reviewed and are negative.     Objective:   Physical Exam Vitals reviewed.  HENT:     Head: Normocephalic.  Cardiovascular:     Rate and Rhythm: Normal rate.     Pulses: Normal pulses.  Pulmonary:     Effort: Pulmonary effort is normal.  Skin:    General: Skin is warm and dry.  Neurological:     Mental Status: He is alert and oriented to person, place, and time.  Psychiatric:        Mood and Affect: Mood normal.        Behavior: Behavior normal.        Thought Content: Thought content normal.        Judgment: Judgment normal.    BP (!) 157/71   Pulse 77   Ht 6' (1.829 m)   Wt 196 lb (88.9 kg)   BMI 26.58 kg/m   Past Medical History:  Diagnosis Date   Abdominal aortic atherosclerosis ([place]) 04/15/2018   ANEMIA-IRON DEFICIENCY 06/23/2007   Ataxia 02/14/2020   B12 deficiency 06/13/2020   Balance problem 01/10/2020   BENIGN  PROSTATIC HYPERTROPHY 06/23/2007   Bilateral leg pain 02/14/2020   BPH (benign prostatic hyperplasia) 06/23/2007   Qualifier: Diagnosis of  By: Paulina Fusi, RN, Daine Gravel   Formatting of this note might be different from the original. Formatting of this note might be different from the original. Qualifier: Diagnosis of  By: Paulina Fusi, RN, Daine Gravel   Chronic kidney disease 10/18/2016   Degeneration of lumbar intervertebral disc 06/30/2018   Diverticulosis    Elevated diaphragm 04/15/2018   Essential hypertension 04/15/2018   Gastro-esophageal reflux disease without esophagitis 06/23/2007   Qualifier: Diagnosis of  By: Paulina Fusi, RN, Daine Gravel   Formatting of this note might be different from the original. Formatting of this note might be different from the original. Qualifier: Diagnosis of  By: Paulina Fusi, RN, Daine Gravel   Hypertension    Low back pain 03/09/2019   Lumbar radiculopathy 05/06/2019   Lumbosacral spondylosis without myelopathy 03/10/2019   MGUS (monoclonal gammopathy of unknown significance)    Monoclonal gammopathy of unknown significance (MGUS) 08/24/2018   Multiple renal cysts 04/06/2018   Night sweats 03/13/2018   Formatting of this note might be different from the original.  Last Assessment & Plan:  Formatting of this note might be different from the original. It appears as if these have resolved now but were part of the constellation of presenting symptoms. Could reflect infectious, metabolic/endocrine, autoimmune process contributing.   Nonsustained ventricular tachycardia 11/03/2019   Polycystic kidney disease    stage 3 per wife   Primary osteoarthritis of both knees 06/30/2018   Bilateral moderate with moderate chondromalacia patella Formatting of this note might be different from the original. Formatting of this note might be different from the original. Bilateral moderate with moderate chondromalacia patella   Stage 3b chronic kidney disease ([place]) 06/21/2020   Weakness of both lower extremities 01/10/2020     Social History   Socioeconomic History   Marital status: Married    Spouse name: Not on file   Number of children: 1   Years of education: Not on file   Highest education level: High school graduate  Occupational History   Not on file  Tobacco Use   Smoking status: Never   Smokeless tobacco: Never  Vaping Use   Vaping Use: Never used  Substance and Sexual Activity   Alcohol use: Never   Drug use: Never   Sexual activity: Not on file  Other Topics Concern   Not on file  Social History Narrative   Lives at home with his wife   Retired   Right handed   Caffeine: about 2 cups daily   Social Determinants of Health   Financial Resource Strain: Not on file  Food Insecurity: Not on file  Transportation Needs: Not on file  Physical Activity: Not on file  Stress: Not on file  Social Connections: Not on file  Intimate Partner Violence: Not on file    Past Surgical History:  Procedure Laterality Date   HAND SURGERY     LUNG SURGERY     for collapsed lung    Family History  Problem Relation Age of Onset   Heart attack Mother    Heart attack Father    Heart attack Brother        "leaky valve"   Neuromuscular disorder Neg Hx     Allergies  Allergen Reactions   Codeine Phosphate Other (See Comments)    Seen odd things crawling up wall   Pregabalin Other (See Comments)    Drowsy, nightmares, stumbling   Amitriptyline Other (See Comments)    SLEPT FOR 18-19 HOURS STRAIGHT   Lexapro [Escitalopram Oxalate] Other (See Comments)    shaking    CBC Latest Ref Rng & Units 09/06/2021 04/01/2021 03/06/2021  WBC 4.0 - 10.5 K/uL 6.4 7.9 5.9  Hemoglobin 13.0 - 17.0 g/dL 14.1 14.9 13.4  Hematocrit 39.0 - 52.0 % 43.5 46.4 40.6  Platelets 150 - 400 K/uL 175 240 173      CMP     Component Value Date/Time   NA 138 09/06/2021 1150   NA 142 07/01/2019 1154   K 4.6 09/06/2021 1150   CL 103 09/06/2021 1150   CO2 29 09/06/2021 1150   GLUCOSE 91 09/06/2021 1150   BUN 26  (H) 09/06/2021 1150   BUN 25 07/01/2019 1154   CREATININE 1.87 (H) 09/06/2021 1150   CREATININE 1.61 (H) 02/26/2019 1511   CALCIUM 9.7 09/06/2021 1150   PROT 6.8 09/06/2021 1150   PROT 6.6 07/20/2018 0944   ALBUMIN 3.9 09/06/2021 1150   AST 13 (L) 09/06/2021 1150   ALT 10 09/06/2021 1150   ALKPHOS 88 09/06/2021 1150  BILITOT 0.5 09/06/2021 1150   GFRNONAA 35 (L) 09/06/2021 1150   GFRAA 48 (L) 04/27/2020 1750   GFRAA 39 (L) 03/07/2020 1250     VAS [place] ABI WITH/WO TBI  Result Date: 10/08/2021  LOWER EXTREMITY DOPPLER STUDY Patient Name:  JUSTN QUALE  Date of Exam:   10/03/2021 Medical Rec #: [id number]         Accession #:    [id number] Date of Birth: 01/19/1936         Patient Gender: M Patient Age:   51 years Exam Location:  [facility] Procedure:      VAS [place] ABI WITH/WO TBI Referring Phys: --------------------------------------------------------------------------------  Indications: Rest pain.  Comparison Study: 05/2019 Performing Technologist: Concha Norway RVT  Examination Guidelines: A complete evaluation includes at minimum, Doppler waveform signals and systolic blood pressure reading at the level of bilateral brachial, anterior tibial, and posterior tibial arteries, when vessel segments are accessible. Bilateral testing is considered an integral part of a complete examination. Photoelectric Plethysmograph (PPG) waveforms and toe systolic pressure readings are included as required and additional duplex testing as needed. Limited examinations for reoccurring indications may be performed as noted.  ABI Findings: +---------+------------------+-----+---------+--------+ Right    Rt Pressure (mmHg)IndexWaveform Comment  +---------+------------------+-----+---------+--------+ Brachial 158                                      +---------+------------------+-----+---------+--------+ ATA      184                    triphasic1.16      +---------+------------------+-----+---------+--------+ PTA      161               1.02 biphasic          +---------+------------------+-----+---------+--------+ Great Toe130               0.82 Normal            +---------+------------------+-----+---------+--------+ +---------+------------------+-----+---------+-------+ Left     Lt Pressure (mmHg)IndexWaveform Comment +---------+------------------+-----+---------+-------+ Brachial 156                                     +---------+------------------+-----+---------+-------+ ATA      188                    triphasic1.19    +---------+------------------+-----+---------+-------+ PTA      175               1.11 triphasic        +---------+------------------+-----+---------+-------+ Great Toe132               0.84 Normal           +---------+------------------+-----+---------+-------+ +-------+-----------+-----------+------------+------------+ ABI/TBIToday's ABIToday's TBIPrevious ABIPrevious TBI +-------+-----------+-----------+------------+------------+ Right  1.16       .82        1.08        .77          +-------+-----------+-----------+------------+------------+ Left   1.19       .84        1.00        .72          +-------+-----------+-----------+------------+------------+ Bilateral ABIs and TBIs appear essentially unchanged compared to prior study on 2020.  Summary: Right: Resting right ankle-brachial index is within normal range. No evidence  of significant right lower extremity arterial disease. The right toe-brachial index is normal. Left: Resting left ankle-brachial index is within normal range. No evidence of significant left lower extremity arterial disease. The left toe-brachial index is normal.  *See table(s) above for measurements and observations.  Electronically signed by Leotis Pain MD on 10/08/2021 at 12:40:05 PM.    Final        Assessment & Plan:   1. Weakness of both lower  extremities Based on the noninvasive studies today and previous noninvasive studies, the patient's continued lower extremity weakness and pain are not related to peripheral arterial disease.  The patient advised to continue to remain active as much as possible.  He should also continue to follow-up with primary care physician for further work-up and evaluation.  He will follow-up with [place] on a as needed basis.  2. Lumbar radiculopathy This may be a contributing cause of the patient's lower extremity weakness.  Patient is advised to continue to follow-up with primary care physician for further work-up and evaluation.  3. Essential hypertension Continue antihypertensive medications as already ordered, these medications have been reviewed and there are no changes at this time.    Current Outpatient Medications on File Prior to Visit  Medication Sig Dispense Refill   Acetaminophen (TYLENOL PO) Take by mouth as needed (pain).     aspirin EC 81 MG tablet Take 81 mg by mouth daily. Swallow whole.     atorvastatin (LIPITOR) 10 MG tablet Take 1 tablet (10 mg total) by mouth daily. 30 tablet 3   cyanocobalamin 1000 MCG tablet Take by mouth.     flecainide (TAMBOCOR) 50 MG tablet Take 1 tablet (50 mg total) by mouth 2 (two) times daily. 180 tablet 3   Multiple Vitamins-Minerals (ICAPS AREDS 2 PO) Take 1 each by mouth daily.     pantoprazole (PROTONIX) 40 MG tablet Take 1 tablet (40 mg total) by mouth daily as needed. 90 tablet 3   traMADol (ULTRAM) 50 MG tablet TAKE 1 TABLET(50 MG) BY MOUTH EVERY 12 HOURS AS NEEDED FOR MODERATE PAIN 90 tablet 1   venlafaxine XR (EFFEXOR XR) 37.5 MG 24 hr capsule Take 1 capsule (37.5 mg total) by mouth daily with breakfast. 30 capsule 2   VITAMIN D PO Take 2,000 Int'l Units by mouth daily.     losartan (COZAAR) 50 MG tablet Take 1 tablet (50 mg total) by mouth daily. 90 tablet 3   Current Facility-Administered Medications on File Prior to Visit  Medication Dose Route  Frequency Provider Last Rate Last Admin   cyanocobalamin ((VITAMIN B-12)) injection 1,000 mcg  1,000 mcg Intramuscular Q30 days Shelda Pal, DO   1,000 mcg at 12/21/20 1410    There are no Patient Instructions on file for this visit. No follow-ups on file.   Kris Hartmann, NP

## 2021-10-26 ENCOUNTER — Other Ambulatory Visit (INDEPENDENT_AMBULATORY_CARE_PROVIDER_SITE_OTHER): Payer: Medicare Other

## 2021-10-26 DIAGNOSIS — Z8673 Personal history of transient ischemic attack (TIA), and cerebral infarction without residual deficits: Secondary | ICD-10-CM | POA: Diagnosis not present

## 2021-10-26 LAB — LIPID PANEL
Cholesterol: 139 mg/dL (ref 0–200)
HDL: 36.4 mg/dL — ABNORMAL LOW (ref >39.00)
LDL Cholesterol: 78 mg/dL (ref 0–99)
NonHDL: 102.13
Total CHOL/HDL Ratio: 4
Triglycerides: 121 mg/dL (ref 0.0–149.0)
VLDL: 24.2 mg/dL (ref 0.0–40.0)

## 2021-10-26 LAB — COMPREHENSIVE METABOLIC PANEL
ALT: 13 U/L (ref 0–53)
AST: 15 U/L (ref 0–37)
Albumin: 4 g/dL (ref 3.5–5.2)
Alkaline Phosphatase: 104 U/L (ref 39–117)
BUN: 27 mg/dL — ABNORMAL HIGH (ref 6–23)
CO2: 29 mEq/L (ref 19–32)
Calcium: 9.2 mg/dL (ref 8.4–10.5)
Chloride: 104 mEq/L (ref 96–112)
Creatinine, Ser: 1.84 mg/dL — ABNORMAL HIGH (ref 0.40–1.50)
GFR: 33.51 mL/min — ABNORMAL LOW (ref >60.00)
Glucose, Bld: 82 mg/dL (ref 70–99)
Potassium: 4.6 mEq/L (ref 3.5–5.1)
Sodium: 141 mEq/L (ref 135–145)
Total Bilirubin: 0.8 mg/dL (ref 0.2–1.2)
Total Protein: 7.1 g/dL (ref 6.0–8.3)

## 2021-10-30 DIAGNOSIS — X32XXXD Exposure to sunlight, subsequent encounter: Secondary | ICD-10-CM | POA: Diagnosis not present

## 2021-10-30 DIAGNOSIS — L570 Actinic keratosis: Secondary | ICD-10-CM | POA: Diagnosis not present

## 2021-10-30 DIAGNOSIS — L820 Inflamed seborrheic keratosis: Secondary | ICD-10-CM | POA: Diagnosis not present

## 2021-11-08 DIAGNOSIS — I6930 Unspecified sequelae of cerebral infarction: Secondary | ICD-10-CM | POA: Diagnosis not present

## 2021-11-08 DIAGNOSIS — G629 Polyneuropathy, unspecified: Secondary | ICD-10-CM | POA: Diagnosis not present

## 2021-11-08 DIAGNOSIS — D472 Monoclonal gammopathy: Secondary | ICD-10-CM | POA: Diagnosis not present

## 2021-11-08 DIAGNOSIS — N1832 Chronic kidney disease, stage 3b: Secondary | ICD-10-CM | POA: Diagnosis not present

## 2021-11-11 ENCOUNTER — Emergency Department (HOSPITAL_COMMUNITY)
Admission: EM | Admit: 2021-11-11 | Discharge: 2021-11-11 | Disposition: A | Discharge status: Home / Self Care | Payer: Medicare Other | Attending: Student | Admitting: Student

## 2021-11-11 ENCOUNTER — Encounter (HOSPITAL_COMMUNITY): Payer: Self-pay | Admitting: Emergency Medicine

## 2021-11-11 ENCOUNTER — Other Ambulatory Visit: Payer: Self-pay

## 2021-11-11 ENCOUNTER — Emergency Department (HOSPITAL_COMMUNITY): Payer: Medicare Other

## 2021-11-11 DIAGNOSIS — K4090 Unilateral inguinal hernia, without obstruction or gangrene, not specified as recurrent: Secondary | ICD-10-CM | POA: Diagnosis not present

## 2021-11-11 DIAGNOSIS — N2889 Other specified disorders of kidney and ureter: Secondary | ICD-10-CM | POA: Diagnosis not present

## 2021-11-11 DIAGNOSIS — N281 Cyst of kidney, acquired: Secondary | ICD-10-CM | POA: Diagnosis not present

## 2021-11-11 DIAGNOSIS — I700 Atherosclerosis of aorta: Secondary | ICD-10-CM | POA: Diagnosis not present

## 2021-11-11 DIAGNOSIS — M545 Low back pain, unspecified: Secondary | ICD-10-CM | POA: Diagnosis present

## 2021-11-11 DIAGNOSIS — Z7982 Long term (current) use of aspirin: Secondary | ICD-10-CM | POA: Insufficient documentation

## 2021-11-11 DIAGNOSIS — K429 Umbilical hernia without obstruction or gangrene: Secondary | ICD-10-CM | POA: Diagnosis not present

## 2021-11-11 LAB — URINALYSIS, ROUTINE W REFLEX MICROSCOPIC
Bilirubin Urine: NEGATIVE
Glucose, UA: NEGATIVE mg/dL
Hgb urine dipstick: NEGATIVE
Ketones, ur: NEGATIVE mg/dL
Leukocytes,Ua: NEGATIVE
Nitrite: NEGATIVE
Protein, ur: NEGATIVE mg/dL
Specific Gravity, Urine: 1.015 (ref 1.005–1.030)
pH: 6 (ref 5.0–8.0)

## 2021-11-11 LAB — COMPREHENSIVE METABOLIC PANEL
ALT: 14 U/L (ref 0–44)
AST: 16 U/L (ref 15–41)
Albumin: 3.6 g/dL (ref 3.5–5.0)
Alkaline Phosphatase: 93 U/L (ref 38–126)
Anion gap: 6 (ref 5–15)
BUN: 25 mg/dL — ABNORMAL HIGH (ref 8–23)
CO2: 25 mmol/L (ref 22–32)
Calcium: 8.6 mg/dL — ABNORMAL LOW (ref 8.9–10.3)
Chloride: 107 mmol/L (ref 98–111)
Creatinine, Ser: 1.68 mg/dL — ABNORMAL HIGH (ref 0.61–1.24)
GFR, Estimated: 40 mL/min — ABNORMAL LOW (ref >60)
Glucose, Bld: 96 mg/dL (ref 70–99)
Potassium: 4.8 mmol/L (ref 3.5–5.1)
Sodium: 138 mmol/L (ref 135–145)
Total Bilirubin: 0.7 mg/dL (ref 0.3–1.2)
Total Protein: 6.7 g/dL (ref 6.5–8.1)

## 2021-11-11 LAB — CBC WITH DIFFERENTIAL/PLATELET
Abs Immature Granulocytes: 0.03 10*3/uL (ref 0.00–0.07)
Basophils Absolute: 0 10*3/uL (ref 0.0–0.1)
Basophils Relative: 1 %
Eosinophils Absolute: 0.1 10*3/uL (ref 0.0–0.5)
Eosinophils Relative: 1 %
HCT: 42 % (ref 39.0–52.0)
Hemoglobin: 13.6 g/dL (ref 13.0–17.0)
Immature Granulocytes: 1 %
Lymphocytes Relative: 18 %
Lymphs Abs: 1.2 10*3/uL (ref 0.7–4.0)
MCH: 31.5 pg (ref 26.0–34.0)
MCHC: 32.4 g/dL (ref 30.0–36.0)
MCV: 97.2 fL (ref 80.0–100.0)
Monocytes Absolute: 0.5 10*3/uL (ref 0.1–1.0)
Monocytes Relative: 8 %
Neutro Abs: 4.5 10*3/uL (ref 1.7–7.7)
Neutrophils Relative %: 71 %
Platelets: 166 10*3/uL (ref 150–400)
RBC: 4.32 MIL/uL (ref 4.22–5.81)
RDW: 13.1 % (ref 11.5–15.5)
WBC: 6.4 10*3/uL (ref 4.0–10.5)
nRBC: 0 % (ref 0.0–0.2)

## 2021-11-11 MED ORDER — METHOCARBAMOL 500 MG PO TABS: 500.0000 mg | ORAL_TABLET | Freq: Two times a day (BID) | ORAL | 0 refills | Status: DC | PRN

## 2021-11-11 MED ORDER — METHOCARBAMOL 500 MG PO TABS
500.0000 mg | ORAL_TABLET | Freq: Once | ORAL | Status: AC
  Administered 2021-11-11: 500 mg via ORAL
  Filled 2021-11-11: qty 1

## 2021-11-11 NOTE — ED Provider Notes (Signed)
[facility] DEPT Provider Note   CSN: [id number] Arrival date & time: 11/11/21  2595     History  Chief Complaint  Patient presents with   Back Pain    Alex Dunn is a 86 y.o. male presenting for evaluation of back pain.   Pt states for the past 3 days he has had back pain, mostly on the R sided. Pain is present with movement and when standing. He has had back problems before, but never to this extent. No new fall or injury. He sees [facility], has an apt on 01/20. He has taken his home tramadol without improvement. He has a h/o kidney cyst on the R that has required drainage before, and he is concerned for renal cause for his pain. No fever, n/v, urinary sxs.   HPI     Home Medications Prior to Admission medications   Medication Sig Start Date End Date Taking? Authorizing Provider  methocarbamol (ROBAXIN) 500 MG tablet Take 1 tablet (500 mg total) by mouth 2 (two) times daily as needed for muscle spasms. 11/11/21  Yes Matisyn Cabeza, PA-C  Acetaminophen (TYLENOL PO) Take by mouth as needed (pain).    [provider]  aspirin EC 81 MG tablet Take 81 mg by mouth daily. Swallow whole.    [provider]  atorvastatin (LIPITOR) 10 MG tablet Take 1 tablet (10 mg total) by mouth daily. 09/14/21   Shelda Pal, DO  cyanocobalamin 1000 MCG tablet Take by mouth.    [provider]  flecainide (TAMBOCOR) 50 MG tablet Take 1 tablet (50 mg total) by mouth 2 (two) times daily. 02/16/21   Evans Lance, MD  losartan (COZAAR) 50 MG tablet Take 1 tablet (50 mg total) by mouth daily. 04/25/21 07/24/21  Evans Lance, MD  Multiple Vitamins-Minerals (ICAPS AREDS 2 PO) Take 1 each by mouth daily.    [provider]  pantoprazole (PROTONIX) 40 MG tablet Take 1 tablet (40 mg total) by mouth daily as needed. 10/16/15   Marletta Lor, MD  traMADol (ULTRAM) 50 MG tablet TAKE 1 TABLET(50 MG) BY MOUTH EVERY 12 HOURS  AS NEEDED FOR MODERATE PAIN 09/14/21   Shelda Pal, DO  venlafaxine XR (EFFEXOR XR) 37.5 MG 24 hr capsule Take 1 capsule (37.5 mg total) by mouth daily with breakfast. 06/05/21   Shelda Pal, DO  VITAMIN D PO Take 2,000 Int'l Units by mouth daily.    [provider]      Allergies    Codeine phosphate, Pregabalin, Amitriptyline, and Lexapro [escitalopram oxalate]    Review of Systems   Review of Systems  Musculoskeletal:  Positive for back pain.  All other systems reviewed and are negative.  Physical Exam Updated Vital Signs BP (!) 141/67    Pulse 65    Temp 98 F (36.7 C) (Oral)    Resp 18    Ht 6' (1.829 m)    Wt 89.8 kg    SpO2 96%    BMI 26.85 kg/m  Physical Exam Vitals and nursing note reviewed.  Constitutional:      General: He is not in acute distress.    Appearance: Normal appearance.  HENT:     Head: Normocephalic and atraumatic.  Eyes:     Conjunctiva/sclera: Conjunctivae normal.     Pupils: Pupils are equal, round, and reactive to light.  Cardiovascular:     Rate and Rhythm: Normal rate and regular rhythm.  Pulses: Normal pulses.  Pulmonary:     Effort: Pulmonary effort is normal. No respiratory distress.     Breath sounds: Normal breath sounds. No wheezing.     Comments: Speaking in full sentences.  Clear lung sounds in all fields. Abdominal:     General: There is no distension.     Palpations: Abdomen is soft. There is no mass.     Tenderness: There is no abdominal tenderness. There is no guarding or rebound.  Musculoskeletal:        General: Normal range of motion.     Cervical back: Normal range of motion and neck supple.     Comments: No ttp of the hip, low back musculature, or low back on my exam. No erythema or rash. Pain with leg raise. No saddle anesthesia. Pedal pulses 2+ bilaterally  Skin:    General: Skin is warm and dry.     Capillary Refill: Capillary refill takes less than 2 seconds.  Neurological:     Mental  Status: He is alert and oriented to person, place, and time.  Psychiatric:        Mood and Affect: Mood and affect normal.        Speech: Speech normal.        Behavior: Behavior normal.    ED Results / Procedures / Treatments   Labs (all labs ordered are listed, but only abnormal results are displayed) Labs Reviewed  COMPREHENSIVE METABOLIC PANEL - Abnormal; Notable for the following components:      Result Value   BUN 25 (*)    Creatinine, Ser 1.68 (*)    Calcium 8.6 (*)    GFR, Estimated 40 (*)    All other components within normal limits  CBC WITH DIFFERENTIAL/PLATELET  URINALYSIS, ROUTINE W REFLEX MICROSCOPIC    EKG None  Radiology CT Renal Stone Study  Result Date: 11/11/2021 CLINICAL DATA:  Right-sided flank pain. Hip pain. Symptoms began last night awakening the patient. No history of kidney stones. EXAM: CT ABDOMEN AND PELVIS WITHOUT CONTRAST TECHNIQUE: Multidetector CT imaging of the abdomen and pelvis was performed following the standard protocol without IV contrast. COMPARISON:  04/27/2020. FINDINGS: Lower chest: No acute findings. Elevated right hemidiaphragm associated with mild dependent atelectasis, stable. Hepatobiliary: No focal liver abnormality is seen. No gallstones, gallbladder wall thickening, or biliary dilatation. Pancreas: Unremarkable. No pancreatic ductal dilatation or surrounding inflammatory changes. Spleen: Normal in size without focal abnormality. Adrenals/Urinary Tract: No adrenal masses. Several low-density right renal masses, largest 2 upper pole, 4.2 cm and posteromedial midpole, 3.6 cm, consistent with cysts. Less well-defined low-density mass, midpole of the left kidney, 1.5 cm. Masses are consistent with cysts. Upper pole mass on the right has decreased in size from the prior study. No renal stones. No hydronephrosis. Normal ureters. Normal bladder. Stomach/Bowel: Normal stomach. Small bowel and colon are normal in caliber. No wall thickening. No  inflammation. Left colon diverticula most evident along the sigmoid. Normal appendix visualized. Vascular/Lymphatic: Aortic atherosclerosis. No aneurysm. No enlarged lymph nodes. Reproductive: Unremarkable. Other: Minimal fat containing umbilical hernia. Small fat containing left inguinal hernia. No ascites. Musculoskeletal: No fracture or acute finding.  No bone lesion. IMPRESSION: 1. No acute findings within the abdomen or pelvis. No renal or ureteral stones or obstructive uropathy. No findings to account for right flank/hip pain. 2. Aortic atherosclerosis. Electronically Signed   By: Lajean Manes M.D.   On: 11/11/2021 10:35    Procedures Procedures    Medications Ordered in ED  Medications  methocarbamol (ROBAXIN) tablet 500 mg (500 mg Oral Given 11/11/21 1056)    ED Course/ Medical Decision Making/ A&P                           Medical Decision Making   This patient presents to the ED for concern of back pain. This involves an extensive number of treatment options, and is a complaint that carries with it a moderate risk of complications and morbidity.  The differential diagnosis includes MSK pain, occult/pathologic fx, sciatica, uti, kidney stone, pyelo.   Co morbidities:  Ckd, lumbosacral spondylosis   Lab Tests:  I ordered, and personally interpreted labs.  The pertinent results include: Leukocytosis.  Baseline kidney function.  Urine without blood or infection.   Imaging Studies:  I ordered imaging studies including ct renal I independently visualized and interpreted imaging which showed no infection, occult or pathologic fracture, lesion, kidney stone. I agree with the radiologist interpretation   Medicines ordered:  I ordered medication including robaxin  for back pain Reevaluation of the patient after these medicines showed that the patient improved   Dispostion:  After consideration of the diagnostic results and the patients response to treatment, I feel that the  patent would benefit from outpatient management with pain control and to follow-up with his orthopedic doctor.  Discussed findings and plan with patient and wife, who are agreeable.  Discussed incidental finding of renal cyst, follow-up with urology as needed.  At this time, patient was negative for greater than precautions given.  Patient states he understands and agrees to plan   Final Clinical Impression(s) / ED Diagnoses Final diagnoses:  Acute right-sided low back pain without sciatica  Renal cyst    Rx / DC Orders ED Discharge Orders          Ordered    methocarbamol (ROBAXIN) 500 MG tablet  2 times daily PRN        11/11/21 1157              [place] Delauder, PA-C 11/11/21 1213    K[place], [employer], MD 11/11/21 1600

## 2021-11-11 NOTE — ED Triage Notes (Signed)
Patient reports lower ride sided back/hip pain radiating up his back for past few days that worsened last night and woke him from his sleep. Denies any injury. Denies any urinary symptoms. Pain worse with movement and standing. Took tramadol this morning.  Pt adds he has a cyst drained about a year ago on the right kidney.

## 2021-11-11 NOTE — Discharge Instructions (Signed)
Continue taking home medication as prescribed. Use Robaxin as needed for severe pain. Follow-up with the orthopedic doctor as scheduled for further evaluation of your back pain. Your CT today did show recurring renal cyst.  Follow-up with your urologist as needed for further management of this. Return to the emergency room if you develop fevers, severe worsening pain, loss of bowel bladder control, numbness, inability to walk, or any new, worsening, or concerning symptoms

## 2021-11-11 NOTE — ED Notes (Signed)
Patient transported to CT 

## 2021-11-16 ENCOUNTER — Encounter: Payer: Self-pay | Admitting: Hematology & Oncology

## 2021-11-16 ENCOUNTER — Inpatient Hospital Stay: Payer: Medicare Other | Attending: Hematology & Oncology

## 2021-11-16 ENCOUNTER — Inpatient Hospital Stay: Payer: Medicare Other | Admitting: Hematology & Oncology

## 2021-11-16 ENCOUNTER — Other Ambulatory Visit: Payer: Self-pay

## 2021-11-16 ENCOUNTER — Ambulatory Visit: Payer: Medicare Other | Admitting: Hematology & Oncology

## 2021-11-16 VITALS — BP 132/62 | HR 70 | Temp 98.4°F | Resp 18 | Ht 72.0 in | Wt 197.8 lb

## 2021-11-16 DIAGNOSIS — Z79899 Other long term (current) drug therapy: Secondary | ICD-10-CM | POA: Insufficient documentation

## 2021-11-16 DIAGNOSIS — G629 Polyneuropathy, unspecified: Secondary | ICD-10-CM | POA: Insufficient documentation

## 2021-11-16 DIAGNOSIS — D472 Monoclonal gammopathy: Secondary | ICD-10-CM

## 2021-11-16 LAB — CMP (CANCER CENTER ONLY)
ALT: 11 U/L (ref 0–44)
AST: 13 U/L — ABNORMAL LOW (ref 15–41)
Albumin: 3.9 g/dL (ref 3.5–5.0)
Alkaline Phosphatase: 100 U/L (ref 38–126)
Anion gap: 5 (ref 5–15)
BUN: 27 mg/dL — ABNORMAL HIGH (ref 8–23)
CO2: 29 mmol/L (ref 22–32)
Calcium: 9.5 mg/dL (ref 8.9–10.3)
Chloride: 105 mmol/L (ref 98–111)
Creatinine: 1.68 mg/dL — ABNORMAL HIGH (ref 0.61–1.24)
GFR, Estimated: 40 mL/min — ABNORMAL LOW (ref >60)
Glucose, Bld: 90 mg/dL (ref 70–99)
Potassium: 4.6 mmol/L (ref 3.5–5.1)
Sodium: 139 mmol/L (ref 135–145)
Total Bilirubin: 0.5 mg/dL (ref 0.3–1.2)
Total Protein: 6.5 g/dL (ref 6.5–8.1)

## 2021-11-16 LAB — CBC WITH DIFFERENTIAL (CANCER CENTER ONLY)
Abs Immature Granulocytes: 0.02 10*3/uL (ref 0.00–0.07)
Basophils Absolute: 0 10*3/uL (ref 0.0–0.1)
Basophils Relative: 1 %
Eosinophils Absolute: 0.1 10*3/uL (ref 0.0–0.5)
Eosinophils Relative: 2 %
HCT: 41.2 % (ref 39.0–52.0)
Hemoglobin: 13.5 g/dL (ref 13.0–17.0)
Immature Granulocytes: 0 %
Lymphocytes Relative: 23 %
Lymphs Abs: 1.6 10*3/uL (ref 0.7–4.0)
MCH: 31.5 pg (ref 26.0–34.0)
MCHC: 32.8 g/dL (ref 30.0–36.0)
MCV: 96 fL (ref 80.0–100.0)
Monocytes Absolute: 0.5 10*3/uL (ref 0.1–1.0)
Monocytes Relative: 8 %
Neutro Abs: 4.4 10*3/uL (ref 1.7–7.7)
Neutrophils Relative %: 66 %
Platelet Count: 182 10*3/uL (ref 150–400)
RBC: 4.29 MIL/uL (ref 4.22–5.81)
RDW: 13.1 % (ref 11.5–15.5)
WBC Count: 6.7 10*3/uL (ref 4.0–10.5)
nRBC: 0 % (ref 0.0–0.2)

## 2021-11-16 LAB — VITAMIN B12: Vitamin B-12: 799 pg/mL (ref 180–914)

## 2021-11-16 LAB — LACTATE DEHYDROGENASE: LDH: 112 U/L (ref 98–192)

## 2021-11-16 NOTE — Progress Notes (Signed)
Hematology and Oncology Follow Up Visit  Alex Dunn [id number] 1935-12-19 86 y.o. 11/16/2021   Principle Diagnosis:  IgA kappa MGUS   Current Therapy:        Observation   Interim History:  Alex Dunn is here today with his wife for follow-up.  He made it through the holidays.  He had a good holiday with his family.  He was not all that busy.  He was in the emergency room recently.  He is having a lot of pain.  This was back pain.  There was concerned that there may have been a problem with a kidney stone.  Had a CT of the abdomen and pelvis.  This was all negative.  He was given some Robaxin which seemed to help quite a bit.  He still has some neuropathy.  He does get around okay but does have a little bit of instability.  His last monoclonal studies when we saw him did not show a monoclonal spike in his blood.  His IgA level was 545 mg/dL.  The Kappa light chain was 6.2 mg/dL.  He has had no problems with bowels or bladder.  His appetite is doing okay.  He has had no nausea or vomiting.  There has been no problems with COVID.  He has had no headache.  There has been no rashes.  He has had no leg swelling.  Overall, his performance status is ECOG 2.     Medications:  Allergies as of 11/16/2021       Reactions   Amitriptyline Other (See Comments)   SLEPT FOR 18-19 HOURS STRAIGHT   Codeine Phosphate Other (See Comments)   Seen odd things crawling up wall   Pregabalin Other (See Comments)   Drowsy, nightmares, stumbling   Lexapro [escitalopram Oxalate] Other (See Comments)   shaking        Medication List        Accurate as of November 16, 2021  2:17 PM. If you have any questions, ask your nurse or doctor.          STOP taking these medications    venlafaxine XR 37.5 MG 24 hr capsule Commonly known as: Effexor XR Stopped by: Volanda Napoleon, MD       TAKE these medications    aspirin EC 81 MG tablet Take 81 mg by mouth daily. Swallow whole.    atorvastatin 10 MG tablet Commonly known as: LIPITOR Take 1 tablet (10 mg total) by mouth daily.   cyanocobalamin 1000 MCG tablet Take by mouth.   flecainide 50 MG tablet Commonly known as: TAMBOCOR Take 1 tablet (50 mg total) by mouth 2 (two) times daily.   ICAPS AREDS 2 PO Take 1 each by mouth daily.   losartan 50 MG tablet Commonly known as: COZAAR Take 1 tablet (50 mg total) by mouth daily.   methocarbamol 500 MG tablet Commonly known as: ROBAXIN Take 1 tablet (500 mg total) by mouth 2 (two) times daily as needed for muscle spasms.   pantoprazole 40 MG tablet Commonly known as: PROTONIX Take 1 tablet (40 mg total) by mouth daily as needed.   traMADol 50 MG tablet Commonly known as: ULTRAM TAKE 1 TABLET(50 MG) BY MOUTH EVERY 12 HOURS AS NEEDED FOR MODERATE PAIN   TYLENOL PO Take by mouth as needed (pain).   VITAMIN D PO Take 2,000 Int'l Units by mouth daily.        Allergies:  Allergies  Allergen Reactions  Amitriptyline Other (See Comments)    SLEPT FOR 18-19 HOURS STRAIGHT   Codeine Phosphate Other (See Comments)    Seen odd things crawling up wall   Pregabalin Other (See Comments)    Drowsy, nightmares, stumbling   Lexapro [Escitalopram Oxalate] Other (See Comments)    shaking    Past Medical History, Surgical history, Social history, and Family History were reviewed and updated.  Review of Systems: All other 10 point review of systems is negative.   Physical Exam:  height is 6' (1.829 m) and weight is 197 lb 12.8 oz (89.7 kg). His oral temperature is 98.4 F (36.9 C). His blood pressure is 132/62 and his pulse is 70. His respiration is 18 and oxygen saturation is 100%.   Wt Readings from Last 3 Encounters:  11/16/21 197 lb 12.8 oz (89.7 kg)  11/11/21 198 lb (89.8 kg)  10/03/21 196 lb (88.9 kg)    Ocular: Sclerae unicteric, pupils equal, round and reactive to light Ear-nose-throat: Oropharynx clear, dentition fair Lymphatic: No cervical  or supraclavicular adenopathy Lungs no rales or rhonchi, good excursion bilaterally Heart regular rate and rhythm, no murmur appreciated Abd soft, nontender, positive bowel sounds MSK no focal spinal tenderness, no joint edema Neuro: non-focal, well-oriented, appropriate affect Breasts: Deferred   Lab Results  Component Value Date   WBC 6.7 11/16/2021   HGB 13.5 11/16/2021   HCT 41.2 11/16/2021   MCV 96.0 11/16/2021   PLT 182 11/16/2021   Lab Results  Component Value Date   FERRITIN 72 09/06/2021   IRON 87 09/06/2021   TIBC 310 09/06/2021   UIBC 223 09/06/2021   IRONPCTSAT 28 09/06/2021   Lab Results  Component Value Date   RETICCTPCT 0.8 08/12/2018   RBC 4.29 11/16/2021   Lab Results  Component Value Date   KPAFRELGTCHN 62.4 (H) 09/06/2021   LAMBDASER 23.9 09/06/2021   KAPLAMBRATIO 2.61 (H) 09/06/2021   Lab Results  Component Value Date   IGGSERUM 1,004 09/06/2021   IGA 545 (H) 09/06/2021   IGMSERUM 56 09/06/2021   Lab Results  Component Value Date   TOTALPROTELP 6.5 09/06/2021   ALBUMINELP 3.4 09/06/2021   A1GS 0.2 09/06/2021   A2GS 0.8 09/06/2021   BETS 1.3 09/06/2021   GAMS 0.8 09/06/2021   MSPIKE Not Observed 09/06/2021   SPEI Comment 09/06/2021     Chemistry      Component Value Date/Time   NA 139 11/16/2021 1256   NA 142 07/01/2019 1154   K 4.6 11/16/2021 1256   CL 105 11/16/2021 1256   CO2 29 11/16/2021 1256   BUN 27 (H) 11/16/2021 1256   BUN 25 07/01/2019 1154   CREATININE 1.68 (H) 11/16/2021 1256   CREATININE 1.61 (H) 02/26/2019 1511      Component Value Date/Time   CALCIUM 9.5 11/16/2021 1256   ALKPHOS 100 11/16/2021 1256   AST 13 (L) 11/16/2021 1256   ALT 11 11/16/2021 1256   BILITOT 0.5 11/16/2021 1256       Impression and Plan: Alex Dunn is a very pleasant 86yo caucasian gentleman with IgA kappa MGUS.  His biggest problem is this neuropathy.  I am unsure exactly how we can help with this.  Again we will have to see what the  monoclonal studies show.  We will have to see what the monoclonal studies show.  He had a bone marrow biopsy about 3 years or so ago.  I suppose this could always be repeated if we find a significant change  in his monoclonal studies.  Of note, he did have vitamin B12 level when we last saw him.  It was 723.  I think we still get him back to see [place] in another 6 months.   Volanda Napoleon, MD 1/13/20232:17 PM

## 2021-11-17 LAB — IGG, IGA, IGM
IgA: 476 mg/dL — ABNORMAL HIGH (ref 61–437)
IgG (Immunoglobin G), Serum: 977 mg/dL (ref 603–1613)
IgM (Immunoglobulin M), Srm: 50 mg/dL (ref 15–143)

## 2021-11-17 LAB — BETA 2 MICROGLOBULIN, SERUM: Beta-2 Microglobulin: 4 mg/L — ABNORMAL HIGH (ref 0.6–2.4)

## 2021-11-19 LAB — PROTEIN ELECTROPHORESIS, SERUM, WITH REFLEX
A/G Ratio: 0.9 (ref 0.7–1.7)
Albumin ELP: 3.1 g/dL (ref 2.9–4.4)
Alpha-1-Globulin: 0.3 g/dL (ref 0.0–0.4)
Alpha-2-Globulin: 0.8 g/dL (ref 0.4–1.0)
Beta Globulin: 1.2 g/dL (ref 0.7–1.3)
Gamma Globulin: 1.1 g/dL (ref 0.4–1.8)
Globulin, Total: 3.4 g/dL (ref 2.2–3.9)
Total Protein ELP: 6.5 g/dL (ref 6.0–8.5)

## 2021-11-19 LAB — KAPPA/LAMBDA LIGHT CHAINS
Kappa free light chain: 55.9 mg/L — ABNORMAL HIGH (ref 3.3–19.4)
Kappa, lambda light chain ratio: 2.69 — ABNORMAL HIGH (ref 0.26–1.65)
Lambda free light chains: 20.8 mg/L (ref 5.7–26.3)

## 2021-11-20 DIAGNOSIS — D472 Monoclonal gammopathy: Secondary | ICD-10-CM | POA: Diagnosis not present

## 2021-11-22 LAB — UPEP/UIFE/LIGHT CHAINS/TP, 24-HR UR
% BETA, Urine: 43.4 %
ALPHA 1 URINE: 6.1 %
Albumin, U: 16.5 %
Alpha 2, Urine: 11.8 %
Free Kappa Lt Chains,Ur: 67.63 mg/L (ref 1.17–86.46)
Free Kappa/Lambda Ratio: 9.32 (ref 1.83–14.26)
Free Lambda Lt Chains,Ur: 7.26 mg/L (ref 0.27–15.21)
GAMMA GLOBULIN URINE: 22.2 %
Total Protein, Urine-Ur/day: 86 mg/24 hr (ref 30–150)
Total Protein, Urine: 7.5 mg/dL
Total Volume: 1150

## 2021-11-23 ENCOUNTER — Telehealth: Payer: Self-pay

## 2021-11-23 DIAGNOSIS — M5136 Other intervertebral disc degeneration, lumbar region: Secondary | ICD-10-CM | POA: Diagnosis not present

## 2021-11-23 DIAGNOSIS — M2578 Osteophyte, vertebrae: Secondary | ICD-10-CM | POA: Diagnosis not present

## 2021-11-23 DIAGNOSIS — M545 Low back pain, unspecified: Secondary | ICD-10-CM | POA: Diagnosis not present

## 2021-11-23 DIAGNOSIS — M5416 Radiculopathy, lumbar region: Secondary | ICD-10-CM | POA: Diagnosis not present

## 2021-11-23 NOTE — Telephone Encounter (Signed)
Pt notified of urinalysis results per order of Dr. Marin Olp. Pt aware that his urine did not show any abnormal protein. Pt stated he had already seen result on MyChart but appreciative of call. Pt verbalized understanding and had no further questions.

## 2021-12-05 DIAGNOSIS — M5416 Radiculopathy, lumbar region: Secondary | ICD-10-CM | POA: Diagnosis not present

## 2021-12-05 DIAGNOSIS — M48062 Spinal stenosis, lumbar region with neurogenic claudication: Secondary | ICD-10-CM | POA: Diagnosis not present

## 2021-12-07 ENCOUNTER — Encounter: Payer: Self-pay | Admitting: Family Medicine

## 2021-12-07 ENCOUNTER — Ambulatory Visit (INDEPENDENT_AMBULATORY_CARE_PROVIDER_SITE_OTHER): Payer: Medicare Other | Admitting: Family Medicine

## 2021-12-07 VITALS — BP 138/62 | HR 67 | Temp 98.1°F | Ht 72.0 in | Wt 199.1 lb

## 2021-12-07 DIAGNOSIS — I700 Atherosclerosis of aorta: Secondary | ICD-10-CM

## 2021-12-07 DIAGNOSIS — G3281 Cerebellar ataxia in diseases classified elsewhere: Secondary | ICD-10-CM | POA: Diagnosis not present

## 2021-12-07 DIAGNOSIS — N1832 Chronic kidney disease, stage 3b: Secondary | ICD-10-CM | POA: Diagnosis not present

## 2021-12-07 DIAGNOSIS — G6181 Chronic inflammatory demyelinating polyneuritis: Secondary | ICD-10-CM

## 2021-12-07 DIAGNOSIS — Z0000 Encounter for general adult medical examination without abnormal findings: Secondary | ICD-10-CM | POA: Diagnosis not present

## 2021-12-07 NOTE — Progress Notes (Signed)
Chief Complaint  Patient presents with   Annual Exam    Well Male Alex Dunn is here for a complete physical.   His last physical was >1 year ago.  Current diet: in general, a "pretty good" diet.   Current exercise: none lately Weight trend: stable Fatigue out of ordinary? No. Seat belt? Yes.   Advanced directive? Yes  Health maintenance Shingrix- Due for #2 Tetanus- Yes Pneumonia vaccine- yes  Past Medical History:  Diagnosis Date   Abdominal aortic atherosclerosis ([place]) 04/15/2018   ANEMIA-IRON DEFICIENCY 06/23/2007   Ataxia 02/14/2020   B12 deficiency 06/13/2020   Balance problem 01/10/2020   BENIGN PROSTATIC HYPERTROPHY 06/23/2007   Bilateral leg pain 02/14/2020   BPH (benign prostatic hyperplasia) 06/23/2007   Qualifier: Diagnosis of  By: Paulina Fusi, RN, Daine Gravel   Formatting of this note might be different from the original. Formatting of this note might be different from the original. Qualifier: Diagnosis of  By: Paulina Fusi, RN, Daine Gravel   Chronic kidney disease 10/18/2016   Degeneration of lumbar intervertebral disc 06/30/2018   Diverticulosis    Elevated diaphragm 04/15/2018   Essential hypertension 04/15/2018   Gastro-esophageal reflux disease without esophagitis 06/23/2007   Qualifier: Diagnosis of  By: Paulina Fusi, RN, Daine Gravel   Formatting of this note might be different from the original. Formatting of this note might be different from the original. Qualifier: Diagnosis of  By: Paulina Fusi, RN, Daine Gravel   Hypertension    Low back pain 03/09/2019   Lumbar radiculopathy 05/06/2019   Lumbosacral spondylosis without myelopathy 03/10/2019   MGUS (monoclonal gammopathy of unknown significance)    Monoclonal gammopathy of unknown significance (MGUS) 08/24/2018   Multiple renal cysts 04/06/2018   Night sweats 03/13/2018   Formatting of this note might be different from the original. Last Assessment & Plan:  Formatting of this note might be different from the original. It appears as if these have  resolved now but were part of the constellation of presenting symptoms. Could reflect infectious, metabolic/endocrine, autoimmune process contributing.   Nonsustained ventricular tachycardia 11/03/2019   Polycystic kidney disease    stage 3 per wife   Primary osteoarthritis of both knees 06/30/2018   Bilateral moderate with moderate chondromalacia patella Formatting of this note might be different from the original. Formatting of this note might be different from the original. Bilateral moderate with moderate chondromalacia patella   Stage 3b chronic kidney disease ([place]) 06/21/2020   Weakness of both lower extremities 01/10/2020     Past Surgical History:  Procedure Laterality Date   HAND SURGERY     LUNG SURGERY     for collapsed lung    Medications  Current Outpatient Medications on File Prior to Visit  Medication Sig Dispense Refill   Acetaminophen (TYLENOL PO) Take by mouth as needed (pain).     aspirin EC 81 MG tablet Take 81 mg by mouth daily. Swallow whole.     atorvastatin (LIPITOR) 10 MG tablet Take 1 tablet (10 mg total) by mouth daily. 30 tablet 3   cyanocobalamin 1000 MCG tablet Take by mouth.     flecainide (TAMBOCOR) 50 MG tablet Take 1 tablet (50 mg total) by mouth 2 (two) times daily. 180 tablet 3   methocarbamol (ROBAXIN) 500 MG tablet Take 1 tablet (500 mg total) by mouth 2 (two) times daily as needed for muscle spasms. 10 tablet 0   Multiple Vitamins-Minerals (ICAPS AREDS 2 PO) Take 1 each by mouth daily.  pantoprazole (PROTONIX) 40 MG tablet Take 1 tablet (40 mg total) by mouth daily as needed. 90 tablet 3   traMADol (ULTRAM) 50 MG tablet TAKE 1 TABLET(50 MG) BY MOUTH EVERY 12 HOURS AS NEEDED FOR MODERATE PAIN 90 tablet 1   VITAMIN D PO Take 2,000 Int'l Units by mouth daily.     losartan (COZAAR) 50 MG tablet Take 1 tablet (50 mg total) by mouth daily. 90 tablet 3    Allergies Allergies  Allergen Reactions   Amitriptyline Other (See Comments)    SLEPT FOR 18-19  HOURS STRAIGHT   Codeine Phosphate Other (See Comments)    Seen odd things crawling up wall   Pregabalin Other (See Comments)    Drowsy, nightmares, stumbling   Lexapro [Escitalopram Oxalate] Other (See Comments)    shaking    Family History Family History  Problem Relation Age of Onset   Heart attack Mother    Heart attack Father    Heart attack Brother        "leaky valve"   Neuromuscular disorder Neg Hx     Review of Systems: Constitutional:  no fevers Eye:  no recent significant change in vision Ears:  No changes in hearing Nose/Mouth/Throat:  no complaints of nasal congestion, no sore throat Cardiovascular: no chest pain Respiratory:  No shortness of breath Gastrointestinal:  No change in bowel habits GU:  No pain Integumentary:  no new abnormal skin lesions reported Neurologic:  no headaches Endocrine:  denies unexplained weight changes  Exam BP 138/62    Pulse 67    Temp 98.1 F (36.7 C) (Oral)    Ht 6' (1.829 m)    Wt 199 lb 2 oz (90.3 kg)    SpO2 99%    BMI 27.01 kg/m  General:  well developed, well nourished, in no apparent distress Skin:  no significant moles, warts, or growths Head:  no masses, lesions, or tenderness Eyes:  pupils equal and round, sclera anicteric without injection Ears:  canals without lesions, TMs shiny without retraction, no obvious effusion, no erythema Nose:  nares patent, septum midline, mucosa normal Throat/Pharynx:  lips and gingiva without lesion; tongue and uvula midline; non-inflamed pharynx; no exudates or postnasal drainage Lungs:  clear to auscultation, breath sounds equal bilaterally, no respiratory distress Cardio:  regular rate and rhythm, no LE edema or bruits Rectal: Deferred GI: BS+, S, NT, ND, no masses or organomegaly Musculoskeletal:  symmetrical muscle groups noted without atrophy or deformity Neuro:  gait normal; deep tendon reflexes normal and symmetric Psych: well oriented with normal range of affect and  appropriate judgment/insight  Assessment and Plan  Well adult exam  Stage 3b chronic kidney disease ([place])  Abdominal aortic atherosclerosis ([facility])  Cerebellar ataxia in diseases classified elsewhere ([place])  CIDP (chronic inflammatory demyelinating polyneuropathy) ([place])   Well 86 y.o. male. Counseled on diet and exercise. Has been getting blood work frequently, will hold off on labs today. Shingrix and divalent COVID booster recommended. Requested copy of advanced directive. Other orders as above. Follow up in 6 mo for med ck or prn.  The patient and spouse voiced understanding and agreement to the plan.  [place], DO 12/07/21 12:41 PM

## 2021-12-07 NOTE — Patient Instructions (Addendum)
Keep the diet clean and stay active.  Aim to do some physical exertion for 150 minutes per week. This is typically divided into 5 days per week, 30 minutes per day. The activity should be enough to get your heart rate up. Anything is better than nothing if you have time constraints.  Call your pharmacy to get your 2nd shingles vaccine.   I recommend getting the updated bivalent covid vaccination booster at your convenience.   Please send me a copy of your advanced directive at your convenience.   Let us know if you need anything.

## 2021-12-17 ENCOUNTER — Other Ambulatory Visit: Payer: Self-pay | Admitting: Family Medicine

## 2021-12-17 DIAGNOSIS — I10 Essential (primary) hypertension: Secondary | ICD-10-CM

## 2021-12-22 ENCOUNTER — Other Ambulatory Visit: Payer: Self-pay | Admitting: Family Medicine

## 2021-12-22 DIAGNOSIS — I10 Essential (primary) hypertension: Secondary | ICD-10-CM

## 2021-12-26 ENCOUNTER — Encounter: Payer: Self-pay | Admitting: Cardiology

## 2021-12-26 ENCOUNTER — Other Ambulatory Visit: Payer: Self-pay

## 2021-12-26 ENCOUNTER — Ambulatory Visit: Payer: Medicare Other | Admitting: Cardiology

## 2021-12-26 VITALS — BP 156/68 | HR 69 | Ht 72.0 in | Wt 198.0 lb

## 2021-12-26 DIAGNOSIS — I10 Essential (primary) hypertension: Secondary | ICD-10-CM | POA: Diagnosis not present

## 2021-12-26 DIAGNOSIS — I700 Atherosclerosis of aorta: Secondary | ICD-10-CM

## 2021-12-26 DIAGNOSIS — I493 Ventricular premature depolarization: Secondary | ICD-10-CM | POA: Diagnosis not present

## 2021-12-26 DIAGNOSIS — I4729 Other ventricular tachycardia: Secondary | ICD-10-CM

## 2021-12-26 NOTE — Patient Instructions (Signed)
Medication Instructions:  Your physician recommends that you continue on your current medications as directed. Please refer to the Current Medication list given to you today.  *If you need a refill on your cardiac medications before your next appointment, please call your pharmacy*   Lab Work: None If you have labs (blood work) drawn today and your tests are completely normal, you will receive your results only by: [place] (if you have MyChart) OR A paper copy in the mail If you have any lab test that is abnormal or we need to change your treatment, we will call you to review the results.   Testing/Procedures: CT-scan of the chest    Follow-Up: At [facility], you and your health needs are our priority.  As part of our continuing mission to provide you with exceptional heart care, we have created designated Provider Care Teams.  These Care Teams include your primary Cardiologist (physician) and Advanced Practice Providers (APPs -  Physician Assistants and Nurse Practitioners) who all work together to provide you with the care you need, when you need it.  We recommend signing up for the patient portal called "MyChart".  Sign up information is provided on this After Visit Summary.  MyChart is used to connect with patients for Virtual Visits (Telemedicine).  Patients are able to view lab/test results, encounter notes, upcoming appointments, etc.  Non-urgent messages can be sent to your provider as well.   To learn more about what you can do with MyChart, go to [email or web address].    Your next appointment:   6 month(s)  The format for your next appointment:   In Person  Provider:   Jenne Campus, MD    Other Instructions None

## 2021-12-26 NOTE — Progress Notes (Signed)
Cardiology Office Note:    Date:  12/26/2021   ID:  Alex Dunn, DOB 1936-02-25, MRN [id number]  PCP:  Shelda Pal, DO  Cardiologist:  Jenne Campus, MD    Referring MD: Shelda Pal*   No chief complaint on file. Doing fine  History of Present Illness:    Alex Dunn is a 86 y.o. male who was originally referred to [place] because of constellation of symptoms.  Investigation included monitoring which showed some episode of nonsustained ventricular tachycardia as well as frequent ventricular ectopy total burden of 16%.  At that time stress test was echocardiograms been performed which were unrevealing eventually he was referred to EP team for consideration of more advanced therapy.  Trial of flecainide has been initiated he seems to be responding quite nicely.  The problem however that he complained about is the fact that he gets pain in his lower extremities.  Arterial duplex has been performed showed no critical lesions, also pain in the back.  He did have some injections with partial relief still complain of having pain in the upper back.  There is no dizziness except for getting up very quickly if there is no palpitations overall seems to be doing well from arrhythmia point of view  Past Medical History:  Diagnosis Date   Abdominal aortic atherosclerosis ([place]) 04/15/2018   ANEMIA-IRON DEFICIENCY 06/23/2007   Ataxia 02/14/2020   B12 deficiency 06/13/2020   Balance problem 01/10/2020   BENIGN PROSTATIC HYPERTROPHY 06/23/2007   Bilateral leg pain 02/14/2020   BPH (benign prostatic hyperplasia) 06/23/2007   Qualifier: Diagnosis of  By: Paulina Fusi, RN, Daine Gravel   Formatting of this note might be different from the original. Formatting of this note might be different from the original. Qualifier: Diagnosis of  By: Paulina Fusi, RN, Daine Gravel   Chronic kidney disease 10/18/2016   Degeneration of lumbar intervertebral disc 06/30/2018   Diverticulosis    Elevated diaphragm 04/15/2018    Essential hypertension 04/15/2018   Gastro-esophageal reflux disease without esophagitis 06/23/2007   Qualifier: Diagnosis of  By: Paulina Fusi, RN, Daine Gravel   Formatting of this note might be different from the original. Formatting of this note might be different from the original. Qualifier: Diagnosis of  By: Paulina Fusi, RN, Daine Gravel   Hypertension    Low back pain 03/09/2019   Lumbar radiculopathy 05/06/2019   Lumbosacral spondylosis without myelopathy 03/10/2019   MGUS (monoclonal gammopathy of unknown significance)    Monoclonal gammopathy of unknown significance (MGUS) 08/24/2018   Multiple renal cysts 04/06/2018   Night sweats 03/13/2018   Formatting of this note might be different from the original. Last Assessment & Plan:  Formatting of this note might be different from the original. It appears as if these have resolved now but were part of the constellation of presenting symptoms. Could reflect infectious, metabolic/endocrine, autoimmune process contributing.   Nonsustained ventricular tachycardia 11/03/2019   Polycystic kidney disease    stage 3 per wife   Primary osteoarthritis of both knees 06/30/2018   Bilateral moderate with moderate chondromalacia patella Formatting of this note might be different from the original. Formatting of this note might be different from the original. Bilateral moderate with moderate chondromalacia patella   Stage 3b chronic kidney disease ([place]) 06/21/2020   Weakness of both lower extremities 01/10/2020    Past Surgical History:  Procedure Laterality Date   HAND SURGERY     LUNG SURGERY     for collapsed lung  Current Medications: No outpatient medications have been marked as taking for the 12/26/21 encounter (Appointment) with Park Liter, MD.     Allergies:   Amitriptyline, Codeine phosphate, Pregabalin, and Lexapro [escitalopram oxalate]   Social History   Socioeconomic History   Marital status: Married    Spouse name: Not on file   Number of  children: 1   Years of education: Not on file   Highest education level: High school graduate  Occupational History   Not on file  Tobacco Use   Smoking status: Never   Smokeless tobacco: Never  Vaping Use   Vaping Use: Never used  Substance and Sexual Activity   Alcohol use: Never   Drug use: Never   Sexual activity: Not on file  Other Topics Concern   Not on file  Social History Narrative   Lives at home with his wife   Retired   Right handed   Caffeine: about 2 cups daily   Social Determinants of Health   Financial Resource Strain: Not on file  Food Insecurity: Not on file  Transportation Needs: Not on file  Physical Activity: Not on file  Stress: Not on file  Social Connections: Not on file     Family History: The patient's family history includes Heart attack in his brother, father, and mother. There is no history of Neuromuscular disorder. ROS:   Please see the history of present illness.    All 14 point review of systems negative except as described per history of present illness  EKGs/Labs/Other Studies Reviewed:      Recent Labs: 11/16/2021: ALT 11; BUN 27; Creatinine 1.68; Hemoglobin 13.5; Platelet Count 182; Potassium 4.6; Sodium 139  Recent Lipid Panel    Component Value Date/Time   CHOL 139 10/26/2021 1045   CHOL 169 11/15/2020 1412   TRIG 121.0 10/26/2021 1045   HDL 36.40 (L) 10/26/2021 1045   HDL 37 (L) 11/15/2020 1412   CHOLHDL 4 10/26/2021 1045   VLDL 24.2 10/26/2021 1045   LDLCALC 78 10/26/2021 1045   LDLCALC 113 (H) 11/15/2020 1412    Physical Exam:    VS:  There were no vitals taken for this visit.    Wt Readings from Last 3 Encounters:  12/07/21 199 lb 2 oz (90.3 kg)  11/16/21 197 lb 12.8 oz (89.7 kg)  11/11/21 198 lb (89.8 kg)     GEN:  Well nourished, well developed in no acute distress HEENT: Normal NECK: No JVD; No carotid bruits LYMPHATICS: No lymphadenopathy CARDIAC: RRR, no murmurs, no rubs, no gallops RESPIRATORY:   Clear to auscultation without rales, wheezing or rhonchi  ABDOMEN: Soft, non-tender, non-distended MUSCULOSKELETAL:  No edema; No deformity  SKIN: Warm and dry LOWER EXTREMITIES: no swelling NEUROLOGIC:  Alert and oriented x 3 PSYCHIATRIC:  Normal affect   ASSESSMENT:    1. Essential hypertension   2. PVC's (premature ventricular contractions)   3. Nonsustained ventricular tachycardia   4. Abdominal aortic atherosclerosis (HCC)    PLAN:    In order of problems listed above:  Essential hypertension blood pressure slightly elevated today but he said at home always good we will continue present management. PVCs successfully suppressed with flecainide.  I appreciate EP expertise. Nonsustained ventricular tachycardia denies having any symptoms of to suggest significant ventricular tachycardia meaning no syncope Abdominal aortic atherosclerosis with aneurysm which is only mild.  Will require to be rechecked next year however because he does have a pain going to the back I am concerned  about potentially having problem with the thoracic aorta.  Therefore I will schedule him to have CT of the chest with no contrast to look at his aorta Dyslipidemia he is on moderate intensity statin form of Lipitor 10 which I will continue I did review K PN which only dated data from 10/26/2021 with LDL 78 HDL 36 good control   Medication Adjustments/Labs and Tests Ordered: Current medicines are reviewed at length with the patient today.  Concerns regarding medicines are outlined above.  No orders of the defined types were placed in this encounter.  Medication changes: No orders of the defined types were placed in this encounter.   Signed, Park Liter, MD, [facility] 12/26/2021 4:23 PM    [place]

## 2021-12-26 NOTE — Addendum Note (Signed)
Addended by: Edwyna Shell I on: 12/26/2021 04:44 PM   Modules accepted: Orders

## 2021-12-28 DIAGNOSIS — M48062 Spinal stenosis, lumbar region with neurogenic claudication: Secondary | ICD-10-CM | POA: Diagnosis not present

## 2021-12-28 DIAGNOSIS — M5136 Other intervertebral disc degeneration, lumbar region: Secondary | ICD-10-CM | POA: Diagnosis not present

## 2021-12-28 DIAGNOSIS — M5489 Other dorsalgia: Secondary | ICD-10-CM | POA: Diagnosis not present

## 2021-12-28 DIAGNOSIS — M546 Pain in thoracic spine: Secondary | ICD-10-CM | POA: Diagnosis not present

## 2021-12-28 DIAGNOSIS — M5416 Radiculopathy, lumbar region: Secondary | ICD-10-CM | POA: Diagnosis not present

## 2022-01-08 ENCOUNTER — Ambulatory Visit (INDEPENDENT_AMBULATORY_CARE_PROVIDER_SITE_OTHER): Payer: Medicare Other

## 2022-01-08 ENCOUNTER — Other Ambulatory Visit: Payer: Self-pay

## 2022-01-08 DIAGNOSIS — Z0000 Encounter for general adult medical examination without abnormal findings: Secondary | ICD-10-CM | POA: Diagnosis not present

## 2022-01-08 NOTE — Patient Instructions (Addendum)
[name] , Thank you for taking time to come for your Medicare Wellness Visit. I appreciate your ongoing commitment to your health goals. Please review the following plan we discussed and let me know if I can assist you in the future.   Screening recommendations/referrals: Colonoscopy: No longer required Recommended yearly ophthalmology/optometry visit for glaucoma screening and checkup Recommended yearly dental visit for hygiene and checkup  Vaccinations: Influenza vaccine: Done 08/24/21 repeat ever year  Pneumococcal vaccine: Up to date Tdap vaccine: Done 09/02/13 repeat every 10 years  Shingles vaccine: Completed 1st dose 01/20/15 pt will follow up as directed by pcp   Covid-19: Completed 1/12, 1/30, 07/11/20 & 06/18/21  Advanced directives: Please bring a copy of your health care power of attorney and living will to the office at your convenience.  Conditions/risks identified: None at this time  Next appointment: Follow up in one year for your annual wellness visit.   Preventive Care 84 Years and Older, Male Preventive care refers to lifestyle choices and visits with your health care provider that can promote health and wellness. What does preventive care include? A yearly physical exam. This is also called an annual well check. Dental exams once or twice a year. Routine eye exams. Ask your health care provider how often you should have your eyes checked. Personal lifestyle choices, including: Daily care of your teeth and gums. Regular physical activity. Eating a healthy diet. Avoiding tobacco and drug use. Limiting alcohol use. Practicing safe sex. Taking low doses of aspirin every day. Taking vitamin and mineral supplements as recommended by your health care provider. What happens during an annual well check? The services and screenings done by your health care provider during your annual well check will depend on your age, overall health, lifestyle risk factors, and family  history of disease. Counseling  Your health care provider may ask you questions about your: Alcohol use. Tobacco use. Drug use. Emotional well-being. Home and relationship well-being. Sexual activity. Eating habits. History of falls. Memory and ability to understand (cognition). Work and work Statistician. Screening  You may have the following tests or measurements: Height, weight, and BMI. Blood pressure. Lipid and cholesterol levels. These may be checked every 5 years, or more frequently if you are over 67 years old. Skin check. Lung cancer screening. You may have this screening every year starting at age 10 if you have a 30-pack-year history of smoking and currently smoke or have quit within the past 15 years. Fecal occult blood test (FOBT) of the stool. You may have this test every year starting at age 8. Flexible sigmoidoscopy or colonoscopy. You may have a sigmoidoscopy every 5 years or a colonoscopy every 10 years starting at age 76. Prostate cancer screening. Recommendations will vary depending on your family history and other risks. Hepatitis C blood test. Hepatitis B blood test. Sexually transmitted disease (STD) testing. Diabetes screening. This is done by checking your blood sugar (glucose) after you have not eaten for a while (fasting). You may have this done every 1-3 years. Abdominal aortic aneurysm (AAA) screening. You may need this if you are a current or former smoker. Osteoporosis. You may be screened starting at age 28 if you are at high risk. Talk with your health care provider about your test results, treatment options, and if necessary, the need for more tests. Vaccines  Your health care provider may recommend certain vaccines, such as: Influenza vaccine. This is recommended every year. Tetanus, diphtheria, and acellular pertussis (Tdap, Td) vaccine.  You may need a Td booster every 10 years. Zoster vaccine. You may need this after age 78. Pneumococcal  13-valent conjugate (PCV13) vaccine. One dose is recommended after age 96. Pneumococcal polysaccharide (PPSV23) vaccine. One dose is recommended after age 78. Talk to your health care provider about which screenings and vaccines you need and how often you need them. This information is not intended to replace advice given to you by your health care provider. Make sure you discuss any questions you have with your health care provider. Document Released: 11/17/2015 Document Revised: 07/10/2016 Document Reviewed: 08/22/2015 Elsevier Interactive Patient Education  2017 [employer] Prevention in the Home Falls can cause injuries. They can happen to people of all ages. There are many things you can do to make your home safe and to help prevent falls. What can I do on the outside of my home? Regularly fix the edges of walkways and driveways and fix any cracks. Remove anything that might make you trip as you walk through a door, such as a raised step or threshold. Trim any bushes or trees on the path to your home. Use bright outdoor lighting. Clear any walking paths of anything that might make someone trip, such as rocks or tools. Regularly check to see if handrails are loose or broken. Make sure that both sides of any steps have handrails. Any raised decks and porches should have guardrails on the edges. Have any leaves, snow, or ice cleared regularly. Use sand or salt on walking paths during winter. Clean up any spills in your garage right away. This includes oil or grease spills. What can I do in the bathroom? Use night lights. Install grab bars by the toilet and in the tub and shower. Do not use towel bars as grab bars. Use non-skid mats or decals in the tub or shower. If you need to sit down in the shower, use a plastic, non-slip stool. Keep the floor dry. Clean up any water that spills on the floor as soon as it happens. Remove soap buildup in the tub or shower regularly. Attach  bath mats securely with double-sided non-slip rug tape. Do not have throw rugs and other things on the floor that can make you trip. What can I do in the bedroom? Use night lights. Make sure that you have a light by your bed that is easy to reach. Do not use any sheets or blankets that are too big for your bed. They should not hang down onto the floor. Have a firm chair that has side arms. You can use this for support while you get dressed. Do not have throw rugs and other things on the floor that can make you trip. What can I do in the kitchen? Clean up any spills right away. Avoid walking on wet floors. Keep items that you use a lot in easy-to-reach places. If you need to reach something above you, use a strong step stool that has a grab bar. Keep electrical cords out of the way. Do not use floor polish or wax that makes floors slippery. If you must use wax, use non-skid floor wax. Do not have throw rugs and other things on the floor that can make you trip. What can I do with my stairs? Do not leave any items on the stairs. Make sure that there are handrails on both sides of the stairs and use them. Fix handrails that are broken or loose. Make sure that handrails are as long as  the stairways. Check any carpeting to make sure that it is firmly attached to the stairs. Fix any carpet that is loose or worn. Avoid having throw rugs at the top or bottom of the stairs. If you do have throw rugs, attach them to the floor with carpet tape. Make sure that you have a light switch at the top of the stairs and the bottom of the stairs. If you do not have them, ask someone to add them for you. What else can I do to help prevent falls? Wear shoes that: Do not have high heels. Have rubber bottoms. Are comfortable and fit you well. Are closed at the toe. Do not wear sandals. If you use a stepladder: Make sure that it is fully opened. Do not climb a closed stepladder. Make sure that both sides of the  stepladder are locked into place. Ask someone to hold it for you, if possible. Clearly mark and make sure that you can see: Any grab bars or handrails. First and last steps. Where the edge of each step is. Use tools that help you move around (mobility aids) if they are needed. These include: Canes. Walkers. Scooters. Crutches. Turn on the lights when you go into a dark area. Replace any light bulbs as soon as they burn out. Set up your furniture so you have a clear path. Avoid moving your furniture around. If any of your floors are uneven, fix them. If there are any pets around you, be aware of where they are. Review your medicines with your doctor. Some medicines can make you feel dizzy. This can increase your chance of falling. Ask your doctor what other things that you can do to help prevent falls. This information is not intended to replace advice given to you by your health care provider. Make sure you discuss any questions you have with your health care provider. Document Released: 08/17/2009 Document Revised: 03/28/2016 Document Reviewed: 11/25/2014 Elsevier Interactive Patient Education  2017 [employer].

## 2022-01-08 NOTE — Progress Notes (Addendum)
Virtual Visit via Telephone Note  I connected with  Alex Dunn on 01/08/22 at  2:30 PM EST by telephone and verified that I am speaking with the correct person using two identifiers.  Medicare Annual Wellness visit completed telephonically due to Covid-19 pandemic.   Persons participating in this call: This Health Coach and this patient.   Location: Patient: Home Provider: Office   I discussed the limitations, risks, security and privacy concerns of performing an evaluation and management service by telephone and the availability of in person appointments. The patient expressed understanding and agreed to proceed.  Unable to perform video visit due to video visit attempted and failed and/or patient does not have video capability.   Some vital signs may be absent or patient reported.   Willette Brace, LPN   Subjective:   Alex Dunn is a 86 y.o. male who presents for Medicare Annual/Subsequent preventive examination.  Review of Systems           Objective:    There were no vitals filed for this visit. There is no height or weight on file to calculate BMI.  Advanced Directives 01/08/2022 11/16/2021 11/11/2021 09/06/2021 04/01/2021 09/06/2020 04/27/2020  Does Patient Have a Medical Advance Directive? Yes Yes No Yes Yes No No  Type of Paramedic of [place];Living will - [place];Living will [place];Living will - -  Does patient want to make changes to medical advance directive? - No - Patient declined - No - Patient declined - - -  Copy of [place] in Chart? No - copy requested No - copy requested - No - copy requested - - -  Would patient like information on creating a medical advance directive? - - - - - No - Patient declined -    Current Medications (verified) Outpatient Encounter Medications as of 01/08/2022  Medication Sig   acetaminophen (TYLENOL) 325 MG  tablet Take 2 tablets by mouth 4 (four) times daily as needed (pain).   aspirin EC 81 MG tablet Take 81 mg by mouth daily. Swallow whole.   atorvastatin (LIPITOR) 10 MG tablet Take 1 tablet (10 mg total) by mouth daily.   cyanocobalamin 1000 MCG tablet Take by mouth.   flecainide (TAMBOCOR) 50 MG tablet Take 1 tablet (50 mg total) by mouth 2 (two) times daily.   methocarbamol (ROBAXIN) 500 MG tablet Take 1 tablet (500 mg total) by mouth 2 (two) times daily as needed for muscle spasms.   Multiple Vitamins-Minerals (ICAPS AREDS 2 PO) Take 1 each by mouth daily.   pantoprazole (PROTONIX) 40 MG tablet Take 1 tablet (40 mg total) by mouth daily as needed.   traMADol (ULTRAM) 50 MG tablet TAKE 1 TABLET(50 MG) BY MOUTH EVERY 12 HOURS AS NEEDED FOR MODERATE PAIN   VITAMIN D PO Take 2,000 Int'l Units by mouth daily.   losartan (COZAAR) 50 MG tablet Take 1 tablet (50 mg total) by mouth daily.   No facility-administered encounter medications on file as of 01/08/2022.    Allergies (verified) Amitriptyline, Codeine phosphate, Pregabalin, and Lexapro [escitalopram oxalate]   History: Past Medical History:  Diagnosis Date   Abdominal aortic atherosclerosis ([place]) 04/15/2018   ANEMIA-IRON DEFICIENCY 06/23/2007   Ataxia 02/14/2020   B12 deficiency 06/13/2020   Balance problem 01/10/2020   BENIGN PROSTATIC HYPERTROPHY 06/23/2007   Bilateral leg pain 02/14/2020   BPH (benign prostatic hyperplasia) 06/23/2007   Qualifier: Diagnosis of  By: Paulina Fusi, RN, Daine Gravel   Formatting of this note might be different from the original. Formatting of this note might be different from the original. Qualifier: Diagnosis of  By: Paulina Fusi, RN, Daine Gravel   Chronic kidney disease 10/18/2016   Degeneration of lumbar intervertebral disc 06/30/2018   Diverticulosis    Elevated diaphragm 04/15/2018   Essential hypertension 04/15/2018   Gastro-esophageal reflux disease without esophagitis 06/23/2007   Qualifier: Diagnosis of  By: Paulina Fusi, RN, Daine Gravel   Formatting of this note might be different from the original. Formatting of this note might be different from the original. Qualifier: Diagnosis of  By: Paulina Fusi, RN, Daine Gravel   Hypertension    Low back pain 03/09/2019   Lumbar radiculopathy 05/06/2019   Lumbosacral spondylosis without myelopathy 03/10/2019   MGUS (monoclonal gammopathy of unknown significance)    Monoclonal gammopathy of unknown significance (MGUS) 08/24/2018   Multiple renal cysts 04/06/2018   Night sweats 03/13/2018   Formatting of this note might be different from the original. Last Assessment & Plan:  Formatting of this note might be different from the original. It appears as if these have resolved now but were part of the constellation of presenting symptoms. Could reflect infectious, metabolic/endocrine, autoimmune process contributing.   Nonsustained ventricular tachycardia 11/03/2019   Polycystic kidney disease    stage 3 per wife   Primary osteoarthritis of both knees 06/30/2018   Bilateral moderate with moderate chondromalacia patella Formatting of this note might be different from the original. Formatting of this note might be different from the original. Bilateral moderate with moderate chondromalacia patella   Stage 3b chronic kidney disease ([place]) 06/21/2020   Weakness of both lower extremities 01/10/2020   Past Surgical History:  Procedure Laterality Date   HAND SURGERY     LUNG SURGERY     for collapsed lung   Family History  Problem Relation Age of Onset   Heart attack Mother    Heart attack Father    Heart attack Brother        "leaky valve"   Neuromuscular disorder Neg Hx    Social History   Socioeconomic History   Marital status: Married    Spouse name: Not on file   Number of children: 1   Years of education: Not on file   Highest education level: High school graduate  Occupational History   Not on file  Tobacco Use   Smoking status: Never    Passive exposure: Never   Smokeless tobacco: Never   Vaping Use   Vaping Use: Never used  Substance and Sexual Activity   Alcohol use: Never   Drug use: Never   Sexual activity: Not on file  Other Topics Concern   Not on file  Social History Narrative   Lives at home with his wife   Retired   Right handed   Caffeine: about 2 cups daily   Social Determinants of Radio broadcast assistant Strain: Low Risk    Difficulty of Paying Living Expenses: Not hard at all  Food Insecurity: No Food Insecurity   Worried About Charity fundraiser in the Last Year: Never true   Arboriculturist in the Last Year: Never true  Transportation Needs: No Transportation Needs   Lack of Transportation (Medical): No   Lack of Transportation (Non-Medical): No  Physical Activity: Inactive   Days of Exercise per Week: 0 days   Minutes of Exercise per Session: 0 min  Stress: No Stress Concern Present   Feeling of Stress : Not at all  Social Connections: Moderately Integrated   Frequency of Communication with Friends and Family: Three times a week   Frequency of Social Gatherings with Friends and Family: More than three times a week   Attends Religious Services: More than 4 times per year   Active Member of [employer] or Organizations: No   Attends Music therapist: Never   Marital Status: Married    Tobacco Counseling Counseling given: Not Answered   Clinical Intake:  Pre-visit preparation completed: Yes  Pain : No/denies pain     BMI - recorded: 26.85 Nutritional Status: BMI 25 -29 Overweight Nutritional Risks: None Diabetes: No  How often do you need to have someone help you when you read instructions, pamphlets, or other written materials from your doctor or pharmacy?: 1 - Never  Diabetic?No  Interpreter Needed?: No  Information entered by :: Charlott Rakes, LPN   Activities of Daily Living In your present state of health, do you have any difficulty performing the following activities: 01/08/2022 06/05/2021  Hearing? Y N   Vision? N N  Difficulty concentrating or making decisions? N N  Walking or climbing stairs? Y N  Comment due to neuropathy -  Dressing or bathing? N N  Doing errands, shopping? N N  Some recent data might be hidden    Patient Care Team: Shelda Pal, DO as PCP - General (Family Medicine) Melvenia Beam, MD as Consulting Physician (Neurology) Pedro Earls, MD as Attending Physician (Family Medicine)  Indicate any recent Medical Services you may have received from other than [facility] providers in the past year (date may be approximate).     Assessment:   This is a routine wellness examination for [place].  Hearing/Vision screen Hearing Screening - Comments:: Pt slight hearing loss Vision Screening - Comments:: Pt follows up with provider who retired will follow up with new provider   Dietary issues and exercise activities discussed:     Goals Addressed             This Visit's Progress    Patient Stated       None at this time       Depression Screen PHQ 2/9 Scores 01/08/2022 12/07/2021 06/05/2021 03/26/2021 04/19/2019 04/30/2018 10/18/2016  PHQ - 2 Score 0 0 0 0 0 0 0    Fall Risk Fall Risk  01/08/2022 12/07/2021 06/05/2021 04/19/2019 04/30/2018  Falls in the past year? 0 0 0 0 No  Number falls in past yr: 0 0 0 - -  Injury with Fall? 0 0 0 - -  Risk for fall due to : Impaired vision;Impaired balance/gait;Impaired mobility No Fall Risks No Fall Risks - -  Follow up Falls prevention discussed Falls evaluation completed Falls evaluation completed - -    FALL RISK PREVENTION PERTAINING TO THE HOME:  Any stairs in or around the home? Yes  If so, are there any without handrails? No  Home free of loose throw rugs in walkways, pet beds, electrical cords, etc? Yes  Adequate lighting in your home to reduce risk of falls? Yes   ASSISTIVE DEVICES UTILIZED TO PREVENT FALLS:  Life alert? No  Use of a cane, walker or w/c? No  Grab bars in the bathroom? Yes  Shower chair  or bench in shower? Yes  Elevated toilet seat or a handicapped toilet? No   TIMED UP AND GO:  Was the test performed? No .  Cognitive Function:     6CIT Screen 01/08/2022  What Year? 0 points  What month? 0 points  What time? 0 points  Count back from 20 0 points  Months in reverse 0 points  Repeat phrase 0 points  Total Score 0    Immunizations Immunization History  Administered Date(s) Administered   Fluad Quad(high Dose 65+) 07/28/2019, 09/07/2020, 08/24/2021   Influenza Split 08/30/2011, 09/01/2012   Influenza Whole 09/03/2007, 08/11/2008, 09/13/2009, 08/24/2010   Influenza, High Dose Seasonal PF 09/02/2013, 08/21/2016, 08/27/2017, 09/10/2018   Influenza,inj,Quad PF,6+ Mos 09/05/2014, 08/01/2015   Influenza-Unspecified 08/20/2018   PFIZER Comirnaty(Gray Top)Covid-19 Tri-Sucrose Vaccine 06/18/2021   PFIZER(Purple Top)SARS-COV-2 Vaccination 11/16/2019, 12/04/2019, 07/11/2020   PPD Test 03/09/2018   Pneumococcal Conjugate-13 09/05/2014   Pneumococcal Polysaccharide-23 11/09/2009   Tetanus 09/02/2013   Zoster Recombinat (Shingrix) 01/20/2015   Zoster, Live 07/31/2014    TDAP status: Up to date  Flu Vaccine status: Up to date  Pneumococcal vaccine status: Up to date  Covid-19 vaccine status: Completed vaccines  Qualifies for Shingles Vaccine? Yes   Zostavax completed Yes   Shingrix Completed?: Yes  Screening Tests Health Maintenance  Topic Date Due   Zoster Vaccines- Shingrix (2 of 2) 03/17/2015   COVID-19 Vaccine (5 - Booster for Pfizer series) 08/13/2021   TETANUS/TDAP  09/03/2023   Pneumonia Vaccine 86+ Years old  Completed   INFLUENZA VACCINE  Completed   HPV VACCINES  Aged Out    Health Maintenance  Health Maintenance Due  Topic Date Due   Zoster Vaccines- Shingrix (2 of 2) 03/17/2015   COVID-19 Vaccine (5 - Booster for Pfizer series) 08/13/2021    Colorectal cancer screening: No longer required.    Additional Screening:  Vision Screening:  Recommended annual ophthalmology exams for early detection of glaucoma and other disorders of the eye. Is the patient up to date with their annual eye exam?  Yes  Who is the provider or what is the name of the office in which the patient attends annual eye exams? Will follow up with new provider  If pt is not established with a provider, would they like to be referred to a provider to establish care? No .   Dental Screening: Recommended annual dental exams for proper oral hygiene  Community Resource Referral / Chronic Care Management: CRR required this visit?  No   CCM required this visit?  No      Plan:     I have personally reviewed and noted the following in the patients chart:   Medical and social history Use of alcohol, tobacco or illicit drugs  Current medications and supplements including opioid prescriptions. Patient is currently taking opioid prescriptions. Information provided to patient regarding non-opioid alternatives. Patient advised to discuss non-opioid treatment plan with their provider. Functional ability and status Nutritional status Physical activity Advanced directives List of other physicians Hospitalizations, surgeries, and ER visits in previous 12 months Vitals Screenings to include cognitive, depression, and falls Referrals and appointments  In addition, I have reviewed and discussed with patient certain preventive protocols, quality metrics, and best practice recommendations. A written personalized care plan for preventive services as well as general preventive health recommendations were provided to patient.     Willette Brace, LPN   07/09/6212   Nurse Notes: None

## 2022-01-22 ENCOUNTER — Ambulatory Visit (HOSPITAL_BASED_OUTPATIENT_CLINIC_OR_DEPARTMENT_OTHER): Payer: Medicare Other

## 2022-01-29 ENCOUNTER — Other Ambulatory Visit: Payer: Self-pay | Admitting: Family Medicine

## 2022-01-29 DIAGNOSIS — Z8673 Personal history of transient ischemic attack (TIA), and cerebral infarction without residual deficits: Secondary | ICD-10-CM

## 2022-01-29 MED ORDER — ATORVASTATIN CALCIUM 10 MG PO TABS: 10.0000 mg | ORAL_TABLET | Freq: Every day | ORAL | 3 refills | Status: DC

## 2022-02-07 DIAGNOSIS — N1832 Chronic kidney disease, stage 3b: Secondary | ICD-10-CM | POA: Diagnosis not present

## 2022-02-13 DIAGNOSIS — Q619 Cystic kidney disease, unspecified: Secondary | ICD-10-CM | POA: Diagnosis not present

## 2022-02-13 DIAGNOSIS — I129 Hypertensive chronic kidney disease with stage 1 through stage 4 chronic kidney disease, or unspecified chronic kidney disease: Secondary | ICD-10-CM | POA: Diagnosis not present

## 2022-02-13 DIAGNOSIS — N1832 Chronic kidney disease, stage 3b: Secondary | ICD-10-CM | POA: Diagnosis not present

## 2022-02-19 ENCOUNTER — Other Ambulatory Visit (HOSPITAL_COMMUNITY): Payer: Self-pay

## 2022-03-07 ENCOUNTER — Other Ambulatory Visit: Payer: Self-pay | Admitting: Internal Medicine

## 2022-04-11 ENCOUNTER — Other Ambulatory Visit: Payer: Self-pay

## 2022-04-11 ENCOUNTER — Inpatient Hospital Stay (HOSPITAL_BASED_OUTPATIENT_CLINIC_OR_DEPARTMENT_OTHER): Payer: Medicare Other | Admitting: Hematology & Oncology

## 2022-04-11 ENCOUNTER — Other Ambulatory Visit: Payer: Self-pay | Admitting: Oncology

## 2022-04-11 ENCOUNTER — Encounter: Payer: Self-pay | Admitting: Hematology & Oncology

## 2022-04-11 ENCOUNTER — Inpatient Hospital Stay: Payer: Medicare Other | Attending: Hematology & Oncology

## 2022-04-11 VITALS — BP 140/81 | HR 69 | Temp 98.4°F | Resp 18 | Ht 72.0 in | Wt 194.8 lb

## 2022-04-11 DIAGNOSIS — R61 Generalized hyperhidrosis: Secondary | ICD-10-CM | POA: Diagnosis not present

## 2022-04-11 DIAGNOSIS — D472 Monoclonal gammopathy: Secondary | ICD-10-CM | POA: Insufficient documentation

## 2022-04-11 DIAGNOSIS — G629 Polyneuropathy, unspecified: Secondary | ICD-10-CM | POA: Diagnosis not present

## 2022-04-11 LAB — CMP (CANCER CENTER ONLY)
ALT: 14 U/L (ref 0–44)
AST: 16 U/L (ref 15–41)
Albumin: 3.9 g/dL (ref 3.5–5.0)
Alkaline Phosphatase: 109 U/L (ref 38–126)
Anion gap: 6 (ref 5–15)
BUN: 28 mg/dL — ABNORMAL HIGH (ref 8–23)
CO2: 28 mmol/L (ref 22–32)
Calcium: 9.2 mg/dL (ref 8.9–10.3)
Chloride: 105 mmol/L (ref 98–111)
Creatinine: 1.74 mg/dL — ABNORMAL HIGH (ref 0.61–1.24)
GFR, Estimated: 38 mL/min — ABNORMAL LOW (ref >60)
Glucose, Bld: 111 mg/dL — ABNORMAL HIGH (ref 70–99)
Potassium: 4.4 mmol/L (ref 3.5–5.1)
Sodium: 139 mmol/L (ref 135–145)
Total Bilirubin: 0.5 mg/dL (ref 0.3–1.2)
Total Protein: 6.7 g/dL (ref 6.5–8.1)

## 2022-04-11 LAB — CBC WITH DIFFERENTIAL (CANCER CENTER ONLY)
Abs Immature Granulocytes: 0.02 10*3/uL (ref 0.00–0.07)
Basophils Absolute: 0 10*3/uL (ref 0.0–0.1)
Basophils Relative: 1 %
Eosinophils Absolute: 0.2 10*3/uL (ref 0.0–0.5)
Eosinophils Relative: 3 %
HCT: 42.9 % (ref 39.0–52.0)
Hemoglobin: 13.9 g/dL (ref 13.0–17.0)
Immature Granulocytes: 0 %
Lymphocytes Relative: 26 %
Lymphs Abs: 1.6 10*3/uL (ref 0.7–4.0)
MCH: 31.7 pg (ref 26.0–34.0)
MCHC: 32.4 g/dL (ref 30.0–36.0)
MCV: 97.7 fL (ref 80.0–100.0)
Monocytes Absolute: 0.5 10*3/uL (ref 0.1–1.0)
Monocytes Relative: 9 %
Neutro Abs: 3.7 10*3/uL (ref 1.7–7.7)
Neutrophils Relative %: 61 %
Platelet Count: 176 10*3/uL (ref 150–400)
RBC: 4.39 MIL/uL (ref 4.22–5.81)
RDW: 12.6 % (ref 11.5–15.5)
WBC Count: 6 10*3/uL (ref 4.0–10.5)
nRBC: 0 % (ref 0.0–0.2)

## 2022-04-11 LAB — LACTATE DEHYDROGENASE: LDH: 109 U/L (ref 98–192)

## 2022-04-11 NOTE — Progress Notes (Signed)
Hematology and Oncology Follow Up Visit  Alex Dunn [id number] [birth year] 86 y.o. 04/11/2022   Principle Diagnosis:  IgA kappa MGUS   Current Therapy:        Observation   Interim History:  Alex Dunn is here today for follow-up.  He comes by himself.  We last saw him back in January.  His neuropathy has been his biggest problem.  I just wish there is some that we could do to try to help this out.  We see him for the IgA kappa MGUS.  When we last saw him, there was no monoclonal spike in his blood.  His IgA level was 476 mg/dL.  The Kappa light chain was 5.6 mg/dL.  He has had no problems with infections.  He has had no issues with COVID.  He has had no problems with cough or shortness of breath.  There is no nausea or vomiting.  He has had no change in bowel or bladder habits.  Overall, I would have said that his performance status is probably ECOG 1.   Medications:  Allergies as of 04/11/2022       Reactions   Amitriptyline Other (See Comments)   SLEPT FOR 18-19 HOURS STRAIGHT   Codeine Phosphate Other (See Comments)   Seen odd things crawling up wall   Pregabalin Other (See Comments)   Drowsy, nightmares, stumbling   Lexapro [escitalopram Oxalate] Other (See Comments)   shaking        Medication List        Accurate as of April 11, 2022  2:21 PM. If you have any questions, ask your nurse or doctor.          acetaminophen 325 MG tablet Commonly known as: TYLENOL Take 2 tablets by mouth 4 (four) times daily as needed (pain).   aspirin EC 81 MG tablet Take 81 mg by mouth daily. Swallow whole.   atorvastatin 10 MG tablet Commonly known as: LIPITOR Take 1 tablet (10 mg total) by mouth daily.   cyanocobalamin 1000 MCG tablet Take by mouth.   flecainide 50 MG tablet Commonly known as: TAMBOCOR TAKE 1 TABLET(50 MG) BY MOUTH TWICE DAILY   ICAPS AREDS 2 PO Take 1 each by mouth daily.   losartan 50 MG tablet Commonly known as: COZAAR Take 1 tablet  (50 mg total) by mouth daily.   methocarbamol 500 MG tablet Commonly known as: ROBAXIN Take 1 tablet (500 mg total) by mouth 2 (two) times daily as needed for muscle spasms.   pantoprazole 40 MG tablet Commonly known as: PROTONIX Take 1 tablet (40 mg total) by mouth daily as needed.   traMADol 50 MG tablet Commonly known as: ULTRAM TAKE 1 TABLET(50 MG) BY MOUTH EVERY 12 HOURS AS NEEDED FOR MODERATE PAIN   VITAMIN D PO Take 2,000 Int'l Units by mouth daily.        Allergies:  Allergies  Allergen Reactions   Amitriptyline Other (See Comments)    SLEPT FOR 18-19 HOURS STRAIGHT   Codeine Phosphate Other (See Comments)    Seen odd things crawling up wall   Pregabalin Other (See Comments)    Drowsy, nightmares, stumbling   Lexapro [Escitalopram Oxalate] Other (See Comments)    shaking    Past Medical History, Surgical history, Social history, and Family History were reviewed and updated.  Review of Systems: Review of Systems  Constitutional: Negative.   HENT: Negative.    Eyes: Negative.   Respiratory: Negative.    Cardiovascular:  Negative.   Gastrointestinal: Negative.   Genitourinary: Negative.   Musculoskeletal: Negative.   Skin: Negative.   Neurological:  Positive for tingling.  Endo/Heme/Allergies: Negative.   Psychiatric/Behavioral: Negative.        Physical Exam:  height is 6' (1.829 m) and weight is 194 lb 12.8 oz (88.4 kg). His oral temperature is 98.4 F (36.9 C). His blood pressure is 140/81 and his pulse is 69. His respiration is 18 and oxygen saturation is 99%.   Wt Readings from Last 3 Encounters:  04/11/22 194 lb 12.8 oz (88.4 kg)  12/26/21 198 lb (89.8 kg)  12/07/21 199 lb 2 oz (90.3 kg)   Physical Exam Vitals reviewed.  HENT:     Head: Normocephalic and atraumatic.  Eyes:     Pupils: Pupils are equal, round, and reactive to light.  Cardiovascular:     Rate and Rhythm: Normal rate and regular rhythm.     Heart sounds: Normal heart  sounds.  Pulmonary:     Effort: Pulmonary effort is normal.     Breath sounds: Normal breath sounds.  Abdominal:     General: Bowel sounds are normal.     Palpations: Abdomen is soft.  Musculoskeletal:        General: No tenderness or deformity. Normal range of motion.     Cervical back: Normal range of motion.  Lymphadenopathy:     Cervical: No cervical adenopathy.  Skin:    General: Skin is warm and dry.     Findings: No erythema or rash.  Neurological:     Mental Status: He is alert and oriented to person, place, and time.  Psychiatric:        Behavior: Behavior normal.        Thought Content: Thought content normal.        Judgment: Judgment normal.      Lab Results  Component Value Date   WBC 6.0 04/11/2022   HGB 13.9 04/11/2022   HCT 42.9 04/11/2022   MCV 97.7 04/11/2022   PLT 176 04/11/2022   Lab Results  Component Value Date   FERRITIN 72 09/06/2021   IRON 87 09/06/2021   TIBC 310 09/06/2021   UIBC 223 09/06/2021   IRONPCTSAT 28 09/06/2021   Lab Results  Component Value Date   RETICCTPCT 0.8 08/12/2018   RBC 4.39 04/11/2022   Lab Results  Component Value Date   KPAFRELGTCHN 55.9 (H) 11/16/2021   LAMBDASER 20.8 11/16/2021   KAPLAMBRATIO 9.32 11/20/2021   Lab Results  Component Value Date   IGGSERUM 977 11/16/2021   IGA 476 (H) 11/16/2021   IGMSERUM 50 11/16/2021   Lab Results  Component Value Date   TOTALPROTELP 6.5 11/16/2021   ALBUMINELP 3.1 11/16/2021   A1GS 0.3 11/16/2021   A2GS 0.8 11/16/2021   BETS 1.2 11/16/2021   GAMS 1.1 11/16/2021   MSPIKE Not Observed 11/16/2021   SPEI Comment 09/06/2021     Chemistry      Component Value Date/Time   NA 139 04/11/2022 1252   NA 142 07/01/2019 1154   K 4.4 04/11/2022 1252   CL 105 04/11/2022 1252   CO2 28 04/11/2022 1252   BUN 28 (H) 04/11/2022 1252   BUN 25 07/01/2019 1154   CREATININE 1.74 (H) 04/11/2022 1252   CREATININE 1.61 (H) 02/26/2019 1511      Component Value Date/Time    CALCIUM 9.2 04/11/2022 1252   ALKPHOS 109 04/11/2022 1252   AST 16 04/11/2022 1252   ALT  14 04/11/2022 1252   BILITOT 0.5 04/11/2022 1252       Impression and Plan: Alex Dunn is a very pleasant 86yo caucasian gentleman with IgA kappa MGUS.  There is no obvious measurable monoclonal spike in his blood last him that we saw him.  He will be interesting to see what the level is at this time.  Again the neuropathy is the problem.  I am not sure what to be done for this.  I forgot to mention that he has been having some issues with with what he thinks is a viral syndrome.  I will know if his testosterone might be on the low side.  We will go ahead and check his testosterone level.   I think we still get him back to see [place] in another 6 months.   Volanda Napoleon, MD 6/8/20232:21 PM

## 2022-04-12 ENCOUNTER — Encounter: Payer: Self-pay | Admitting: *Deleted

## 2022-04-12 LAB — KAPPA/LAMBDA LIGHT CHAINS
Kappa free light chain: 66.8 mg/L — ABNORMAL HIGH (ref 3.3–19.4)
Kappa, lambda light chain ratio: 2.9 — ABNORMAL HIGH (ref 0.26–1.65)
Lambda free light chains: 23 mg/L (ref 5.7–26.3)

## 2022-04-12 LAB — IGG, IGA, IGM
IgA: 490 mg/dL — ABNORMAL HIGH (ref 61–437)
IgG (Immunoglobin G), Serum: 923 mg/dL (ref 603–1613)
IgM (Immunoglobulin M), Srm: 47 mg/dL (ref 15–143)

## 2022-04-12 LAB — TESTOSTERONE: Testosterone: 344 ng/dL (ref 264–916)

## 2022-04-15 LAB — PROTEIN ELECTROPHORESIS, SERUM, WITH REFLEX
A/G Ratio: 1.1 (ref 0.7–1.7)
Albumin ELP: 3.4 g/dL (ref 2.9–4.4)
Alpha-1-Globulin: 0.2 g/dL (ref 0.0–0.4)
Alpha-2-Globulin: 0.8 g/dL (ref 0.4–1.0)
Beta Globulin: 1.2 g/dL (ref 0.7–1.3)
Gamma Globulin: 0.9 g/dL (ref 0.4–1.8)
Globulin, Total: 3.1 g/dL (ref 2.2–3.9)
Total Protein ELP: 6.5 g/dL (ref 6.0–8.5)

## 2022-04-29 ENCOUNTER — Encounter: Payer: Self-pay | Admitting: Internal Medicine

## 2022-04-29 ENCOUNTER — Ambulatory Visit: Payer: Medicare Other | Admitting: Internal Medicine

## 2022-04-29 VITALS — BP 118/64 | HR 70 | Ht 72.0 in | Wt 196.0 lb

## 2022-04-29 DIAGNOSIS — I10 Essential (primary) hypertension: Secondary | ICD-10-CM | POA: Diagnosis not present

## 2022-04-29 DIAGNOSIS — I493 Ventricular premature depolarization: Secondary | ICD-10-CM

## 2022-04-29 NOTE — Progress Notes (Signed)
HPI Alex Dunn returns today for ongoing evaluation of NSVT/PVC's. He does not have palpitations but he does note that during these times his pulse will be low and he has had documented ventricular bigeminy. He has done well on flecainide. He feels well but does note some orthostatic symptoms. His bp at home is normal. He has held his bp meds when his bp is a little low. Allergies  Allergen Reactions   Amitriptyline Other (See Comments)    SLEPT FOR 18-19 HOURS STRAIGHT   Codeine Phosphate Other (See Comments)    Seen odd things crawling up wall   Pregabalin Other (See Comments)    Drowsy, nightmares, stumbling   Lexapro [Escitalopram Oxalate] Other (See Comments)    shaking     Current Outpatient Medications  Medication Sig Dispense Refill   acetaminophen (TYLENOL) 325 MG tablet Take 2 tablets by mouth 4 (four) times daily as needed (pain).     aspirin EC 81 MG tablet Take 81 mg by mouth daily. Swallow whole.     atorvastatin (LIPITOR) 10 MG tablet Take 1 tablet (10 mg total) by mouth daily. 30 tablet 3   cyanocobalamin 1000 MCG tablet Take by mouth.     flecainide (TAMBOCOR) 50 MG tablet TAKE 1 TABLET(50 MG) BY MOUTH TWICE DAILY 180 tablet 0   losartan (COZAAR) 50 MG tablet Take 1 tablet (50 mg total) by mouth daily. 90 tablet 3   methocarbamol (ROBAXIN) 500 MG tablet Take 1 tablet (500 mg total) by mouth 2 (two) times daily as needed for muscle spasms. 10 tablet 0   Multiple Vitamins-Minerals (ICAPS AREDS 2 PO) Take 1 each by mouth daily.     pantoprazole (PROTONIX) 40 MG tablet Take 1 tablet (40 mg total) by mouth daily as needed. 90 tablet 3   traMADol (ULTRAM) 50 MG tablet TAKE 1 TABLET(50 MG) BY MOUTH EVERY 12 HOURS AS NEEDED FOR MODERATE PAIN 90 tablet 1   VITAMIN D PO Take 2,000 Int'l Units by mouth daily.     No current facility-administered medications for this visit.     Past Medical History:  Diagnosis Date   Abdominal aortic atherosclerosis (HCC) 04/15/2018    ANEMIA-IRON DEFICIENCY 06/23/2007   Ataxia 02/14/2020   B12 deficiency 06/13/2020   Balance problem 01/10/2020   BENIGN PROSTATIC HYPERTROPHY 06/23/2007   Bilateral leg pain 02/14/2020   BPH (benign prostatic hyperplasia) 06/23/2007   Qualifier: Diagnosis of  By: Marcelyn Ditty, RN, Katy Fitch   Formatting of this note might be different from the original. Formatting of this note might be different from the original. Qualifier: Diagnosis of  By: Marcelyn Ditty RN, Katy Fitch   Chronic kidney disease 10/18/2016   Degeneration of lumbar intervertebral disc 06/30/2018   Diverticulosis    Elevated diaphragm 04/15/2018   Essential hypertension 04/15/2018   Gastro-esophageal reflux disease without esophagitis 06/23/2007   Qualifier: Diagnosis of  By: Marcelyn Ditty, RN, Katy Fitch   Formatting of this note might be different from the original. Formatting of this note might be different from the original. Qualifier: Diagnosis of  By: Marcelyn Ditty, RN, Katy Fitch   Hypertension    Low back pain 03/09/2019   Lumbar radiculopathy 05/06/2019   Lumbosacral spondylosis without myelopathy 03/10/2019   MGUS (monoclonal gammopathy of unknown significance)    Monoclonal gammopathy of unknown significance (MGUS) 08/24/2018   Multiple renal cysts 04/06/2018   Night sweats 03/13/2018   Formatting of this note might be different from the original. Last Assessment &  Plan:  Formatting of this note might be different from the original. It appears as if these have resolved now but were part of the constellation of presenting symptoms. Could reflect infectious, metabolic/endocrine, autoimmune process contributing.   Nonsustained ventricular tachycardia (HCC) 11/03/2019   Polycystic kidney disease    stage 3 per wife   Primary osteoarthritis of both knees 06/30/2018   Bilateral moderate with moderate chondromalacia patella Formatting of this note might be different from the original. Formatting of this note might be different from the original. Bilateral moderate with  moderate chondromalacia patella   Stage 3b chronic kidney disease (HCC) 06/21/2020   Weakness of both lower extremities 01/10/2020    ROS:   All systems reviewed and negative except as noted in the HPI.   Past Surgical History:  Procedure Laterality Date   HAND SURGERY     LUNG SURGERY     for collapsed lung     Family History  Problem Relation Age of Onset   Heart attack Mother    Heart attack Father    Heart attack Brother        "leaky valve"   Neuromuscular disorder Neg Hx      Social History   Socioeconomic History   Marital status: Married    Spouse name: Not on file   Number of children: 1   Years of education: Not on file   Highest education level: High school graduate  Occupational History   Not on file  Tobacco Use   Smoking status: Never    Passive exposure: Never   Smokeless tobacco: Never  Vaping Use   Vaping Use: Never used  Substance and Sexual Activity   Alcohol use: Never   Drug use: Never   Sexual activity: Not on file  Other Topics Concern   Not on file  Social History Narrative   Lives at home with his wife   Retired   Right handed   Caffeine: about 2 cups daily   Social Determinants of Health   Financial Resource Strain: Low Risk  (01/08/2022)   Overall Financial Resource Strain (CARDIA)    Difficulty of Paying Living Expenses: Not hard at all  Food Insecurity: No Food Insecurity (01/08/2022)   Hunger Vital Sign    Worried About Running Out of Food in the Last Year: Never true    Ran Out of Food in the Last Year: Never true  Transportation Needs: No Transportation Needs (01/08/2022)   PRAPARE - Administrator, Civil Service (Medical): No    Lack of Transportation (Non-Medical): No  Physical Activity: Inactive (01/08/2022)   Exercise Vital Sign    Days of Exercise per Week: 0 days    Minutes of Exercise per Session: 0 min  Stress: No Stress Concern Present (01/08/2022)   [employer] of Occupational Health - Occupational  Stress Questionnaire    Feeling of Stress : Not at all  Social Connections: Moderately Integrated (01/08/2022)   Social Connection and Isolation Panel [NHANES]    Frequency of Communication with Friends and Family: Three times a week    Frequency of Social Gatherings with Friends and Family: More than three times a week    Attends Religious Services: More than 4 times per year    Active Member of [employer] or Organizations: No    Attends Banker Meetings: Never    Marital Status: Married  Catering manager Violence: Not At Risk (01/08/2022)   Humiliation, Afraid, Rape, and Kick questionnaire  Fear of Current or Ex-Partner: No    Emotionally Abused: No    Physically Abused: No    Sexually Abused: No     BP 118/64   Pulse 70   Ht 6' (1.829 m)   Wt 196 lb (88.9 kg)   SpO2 95%   BMI 26.58 kg/m   Physical Exam:  Well appearing NAD HEENT: Unremarkable Neck:  No JVD, no thyromegally Lymphatics:  No adenopathy Back:  No CVA tenderness Lungs:  Clear with no wheezes HEART:  Regular rate rhythm, no murmurs, no rubs, no clicks Abd:  soft, positive bowel sounds, no organomegally, no rebound, no guarding Ext:  2 plus pulses, no edema, no cyanosis, no clubbing Skin:  No rashes no nodules Neuro:  CN II through XII intact, motor grossly intact   Assess/Plan:  1. Ventricular ectopy - he will continue the flecainide. It appears that the PVC's are well controlled. 2. HTN - his bp is fairly well controlled at home and he c/o orthostasis. I have asked him to reduce his losartan as needed 3. Orthostasis - we discussed the importance of standing up slowly. He will continue his current meds except as noted above. I asked him to increase his salt intake if his pressure is a little low.  Sharlot Gowda Remmie Bembenek,MD

## 2022-05-15 DIAGNOSIS — M5416 Radiculopathy, lumbar region: Secondary | ICD-10-CM | POA: Diagnosis not present

## 2022-05-15 DIAGNOSIS — M5136 Other intervertebral disc degeneration, lumbar region: Secondary | ICD-10-CM | POA: Diagnosis not present

## 2022-05-16 ENCOUNTER — Other Ambulatory Visit: Payer: Self-pay | Admitting: Family Medicine

## 2022-05-16 DIAGNOSIS — M5416 Radiculopathy, lumbar region: Secondary | ICD-10-CM

## 2022-05-21 ENCOUNTER — Other Ambulatory Visit: Payer: Self-pay | Admitting: Family Medicine

## 2022-05-21 ENCOUNTER — Ambulatory Visit
Admission: RE | Admit: 2022-05-21 | Discharge: 2022-05-21 | Disposition: A | Discharge status: Home / Self Care | Payer: Medicare Other | Source: Ambulatory Visit | Attending: Family Medicine | Admitting: Family Medicine

## 2022-05-21 DIAGNOSIS — M4727 Other spondylosis with radiculopathy, lumbosacral region: Secondary | ICD-10-CM | POA: Diagnosis not present

## 2022-05-21 DIAGNOSIS — M5416 Radiculopathy, lumbar region: Secondary | ICD-10-CM

## 2022-05-21 MED ORDER — METHYLPREDNISOLONE ACETATE 40 MG/ML INJ SUSP (RADIOLOG
80.0000 mg | Freq: Once | INTRAMUSCULAR | Status: AC
  Administered 2022-05-21: 80 mg via EPIDURAL

## 2022-05-21 MED ORDER — IOPAMIDOL (ISOVUE-M 200) INJECTION 41%
1.0000 mL | Freq: Once | INTRAMUSCULAR | Status: AC
  Administered 2022-05-21: 1 mL via EPIDURAL

## 2022-05-21 NOTE — Discharge Instructions (Signed)

## 2022-06-05 ENCOUNTER — Encounter: Payer: Self-pay | Admitting: Family Medicine

## 2022-06-05 ENCOUNTER — Ambulatory Visit (INDEPENDENT_AMBULATORY_CARE_PROVIDER_SITE_OTHER): Payer: Medicare Other | Admitting: Family Medicine

## 2022-06-05 VITALS — BP 132/76 | HR 71 | Temp 98.4°F | Ht 72.0 in | Wt 193.0 lb

## 2022-06-05 DIAGNOSIS — Z8673 Personal history of transient ischemic attack (TIA), and cerebral infarction without residual deficits: Secondary | ICD-10-CM

## 2022-06-05 DIAGNOSIS — I700 Atherosclerosis of aorta: Secondary | ICD-10-CM

## 2022-06-05 DIAGNOSIS — M792 Neuralgia and neuritis, unspecified: Secondary | ICD-10-CM | POA: Diagnosis not present

## 2022-06-05 LAB — LIPID PANEL
Cholesterol: 140 mg/dL (ref 0–200)
HDL: 44.1 mg/dL (ref >39.00)
LDL Cholesterol: 76 mg/dL (ref 0–99)
NonHDL: 96.36
Total CHOL/HDL Ratio: 3
Triglycerides: 103 mg/dL (ref 0.0–149.0)
VLDL: 20.6 mg/dL (ref 0.0–40.0)

## 2022-06-05 LAB — COMPREHENSIVE METABOLIC PANEL
ALT: 19 U/L (ref 0–53)
AST: 14 U/L (ref 0–37)
Albumin: 3.6 g/dL (ref 3.5–5.2)
Alkaline Phosphatase: 97 U/L (ref 39–117)
BUN: 32 mg/dL — ABNORMAL HIGH (ref 6–23)
CO2: 29 mEq/L (ref 19–32)
Calcium: 8.8 mg/dL (ref 8.4–10.5)
Chloride: 103 mEq/L (ref 96–112)
Creatinine, Ser: 1.75 mg/dL — ABNORMAL HIGH (ref 0.40–1.50)
GFR: 35.43 mL/min — ABNORMAL LOW (ref >60.00)
Glucose, Bld: 115 mg/dL — ABNORMAL HIGH (ref 70–99)
Potassium: 4.5 mEq/L (ref 3.5–5.1)
Sodium: 140 mEq/L (ref 135–145)
Total Bilirubin: 0.6 mg/dL (ref 0.2–1.2)
Total Protein: 6.2 g/dL (ref 6.0–8.3)

## 2022-06-05 MED ORDER — ATORVASTATIN CALCIUM 10 MG PO TABS: 10.0000 mg | ORAL_TABLET | Freq: Every day | ORAL | 3 refills | Status: DC

## 2022-06-05 NOTE — Progress Notes (Signed)
Chief Complaint  Patient presents with   Follow-up    Neuropathy     Subjective: Patient is a 86 y.o. male here for follow up.  Neuropathic pain worsening in ankles and feet. Burning/achy pain. Tramadol helps, but can sometimes give him constipation. Otherwise no AE's. Prednisone burst did help from his back doc. Has appt w Neuro in 2 d.  He has failed Effexor, Cymbalta, gabapentin.  Hyperlipidemia Patient presents for dyslipidemia follow up. Currently being treated with Lipitor 10 mg/d and compliance with treatment thus far has been good. He denies myalgias. He is adhering to a healthy diet. Exercise: active in yard The patient is not known to have coexisting coronary artery disease. No Cp or SOB.   Past Medical History:  Diagnosis Date   Abdominal aortic atherosclerosis ([place]) 04/15/2018   ANEMIA-IRON DEFICIENCY 06/23/2007   Ataxia 02/14/2020   B12 deficiency 06/13/2020   Balance problem 01/10/2020   BENIGN PROSTATIC HYPERTROPHY 06/23/2007   Bilateral leg pain 02/14/2020   BPH (benign prostatic hyperplasia) 06/23/2007   Qualifier: Diagnosis of  By: Paulina Fusi, RN, Daine Gravel   Formatting of this note might be different from the original. Formatting of this note might be different from the original. Qualifier: Diagnosis of  By: Paulina Fusi, RN, Daine Gravel   Chronic kidney disease 10/18/2016   Degeneration of lumbar intervertebral disc 06/30/2018   Diverticulosis    Elevated diaphragm 04/15/2018   Essential hypertension 04/15/2018   Gastro-esophageal reflux disease without esophagitis 06/23/2007   Qualifier: Diagnosis of  By: Paulina Fusi, RN, Daine Gravel   Formatting of this note might be different from the original. Formatting of this note might be different from the original. Qualifier: Diagnosis of  By: Paulina Fusi, RN, Daine Gravel   Hypertension    Low back pain 03/09/2019   Lumbar radiculopathy 05/06/2019   Lumbosacral spondylosis without myelopathy 03/10/2019   MGUS (monoclonal gammopathy of unknown significance)     Monoclonal gammopathy of unknown significance (MGUS) 08/24/2018   Multiple renal cysts 04/06/2018   Night sweats 03/13/2018   Formatting of this note might be different from the original. Last Assessment & Plan:  Formatting of this note might be different from the original. It appears as if these have resolved now but were part of the constellation of presenting symptoms. Could reflect infectious, metabolic/endocrine, autoimmune process contributing.   Nonsustained ventricular tachycardia ([place]) 11/03/2019   Polycystic kidney disease    stage 3 per wife   Primary osteoarthritis of both knees 06/30/2018   Bilateral moderate with moderate chondromalacia patella Formatting of this note might be different from the original. Formatting of this note might be different from the original. Bilateral moderate with moderate chondromalacia patella   Stage 3b chronic kidney disease ([place]) 06/21/2020   Weakness of both lower extremities 01/10/2020    Objective: BP 132/76   Pulse 71   Temp 98.4 F (36.9 C) (Oral)   Ht 6' (1.829 m)   Wt 193 lb (87.5 kg)   SpO2 98%   BMI 26.18 kg/m  General: Awake, appears stated age Heart: RRR, no LE edema Lungs: CTAB, no rales, wheezes or rhonchi. No accessory muscle use Neuro: Gait is slow/steady, no cerebellar signs Psych: Age appropriate judgment and insight, normal affect and mood  Assessment and Plan: Neuropathic pain  Abdominal aortic atherosclerosis (HCC)  Chronic, uncontrolled. Cont tramadol prn.  He has failed Effexor, Cymbalta, and gabapentin.  Could consider nortriptyline or low-dose Lyrica.  He will see his neurologist in 2 days.  If  he has lengthy follow-up or no particular plan, I will send in some nortriptyline 10 mg nightly. Chronic, stable. Cont Lipitor 10 mg/d. Counseled on diet/exercise.  F/u in 6 mo for CPE or prn.  The patient voiced understanding and agreement to the plan.  [place], DO 06/05/22  1:37 PM

## 2022-06-05 NOTE — Patient Instructions (Signed)
You have failed gabapentin, Effexor and Cymbalta. Pending no changes with your neurologist, we can consider nortriptyline daily.   Send me a message or schedule a 1 mo follow up if we need to pursue this.   Keep the diet clean and stay active.  Let us know if you need anything.

## 2022-06-07 ENCOUNTER — Ambulatory Visit: Payer: Medicare Other | Admitting: Family Medicine

## 2022-06-07 DIAGNOSIS — G629 Polyneuropathy, unspecified: Secondary | ICD-10-CM | POA: Diagnosis not present

## 2022-06-07 DIAGNOSIS — D472 Monoclonal gammopathy: Secondary | ICD-10-CM | POA: Diagnosis not present

## 2022-06-07 DIAGNOSIS — I6930 Unspecified sequelae of cerebral infarction: Secondary | ICD-10-CM | POA: Diagnosis not present

## 2022-06-07 DIAGNOSIS — N1832 Chronic kidney disease, stage 3b: Secondary | ICD-10-CM | POA: Diagnosis not present

## 2022-06-12 ENCOUNTER — Telehealth: Payer: Self-pay | Admitting: Family Medicine

## 2022-06-12 ENCOUNTER — Other Ambulatory Visit: Payer: Self-pay

## 2022-06-12 DIAGNOSIS — G8929 Other chronic pain: Secondary | ICD-10-CM

## 2022-06-12 MED ORDER — TRAMADOL HCL 50 MG PO TABS: ORAL_TABLET | ORAL | 1 refills | Status: DC

## 2022-06-12 MED ORDER — FLECAINIDE ACETATE 50 MG PO TABS: ORAL_TABLET | ORAL | 3 refills | Status: DC

## 2022-06-12 NOTE — Telephone Encounter (Signed)
[name] (spouse) called stating that she wanted to let Dr. Nani Ravens know that the information from pt's visit to the neurologist with [facility] has been put in and should be available.

## 2022-06-12 NOTE — Telephone Encounter (Signed)
Refill Tramadol OV--06/05/2022 Last RF---#90 with 1 refill on 09/14/2021

## 2022-06-12 NOTE — Telephone Encounter (Signed)
r 

## 2022-06-14 DIAGNOSIS — M5030 Other cervical disc degeneration, unspecified cervical region: Secondary | ICD-10-CM | POA: Diagnosis not present

## 2022-06-14 DIAGNOSIS — M5136 Other intervertebral disc degeneration, lumbar region: Secondary | ICD-10-CM | POA: Diagnosis not present

## 2022-06-14 DIAGNOSIS — M5416 Radiculopathy, lumbar region: Secondary | ICD-10-CM | POA: Diagnosis not present

## 2022-06-14 DIAGNOSIS — M48062 Spinal stenosis, lumbar region with neurogenic claudication: Secondary | ICD-10-CM | POA: Diagnosis not present

## 2022-06-18 DIAGNOSIS — H353132 Nonexudative age-related macular degeneration, bilateral, intermediate dry stage: Secondary | ICD-10-CM | POA: Diagnosis not present

## 2022-07-03 ENCOUNTER — Ambulatory Visit (INDEPENDENT_AMBULATORY_CARE_PROVIDER_SITE_OTHER): Payer: Medicare Other | Admitting: Family Medicine

## 2022-07-03 ENCOUNTER — Encounter: Payer: Self-pay | Admitting: Family Medicine

## 2022-07-03 VITALS — BP 140/72 | HR 92 | Temp 98.3°F | Ht 72.0 in | Wt 191.0 lb

## 2022-07-03 DIAGNOSIS — J0110 Acute frontal sinusitis, unspecified: Secondary | ICD-10-CM | POA: Diagnosis not present

## 2022-07-03 MED ORDER — DOXYCYCLINE HYCLATE 100 MG PO TABS: 100.0000 mg | ORAL_TABLET | Freq: Two times a day (BID) | ORAL | 0 refills | Status: AC

## 2022-07-03 MED ORDER — METHYLPREDNISOLONE ACETATE 80 MG/ML IJ SUSP
80.0000 mg | Freq: Once | INTRAMUSCULAR | Status: AC
  Administered 2022-07-03: 80 mg via INTRAMUSCULAR

## 2022-07-03 NOTE — Progress Notes (Signed)
Chief Complaint  Patient presents with   Sinus Problem   Cough    Sneezing All for over a week    Alex Dunn here for URI complaints.  Duration: 1 week  Associated symptoms: sneezing, sinus congestion, sinus pain, rhinorrhea, sore throat, myalgias, and coughing Getting worse 3 d ago.  Denies: itchy watery eyes, ear pain, ear drainage, wheezing, shortness of breath, and fevers Treatment to date: Tylenol, Benadryl Sick contacts: No Was outside working just prior to start of s/s's.   Past Medical History:  Diagnosis Date   Abdominal aortic atherosclerosis ([place]) 04/15/2018   ANEMIA-IRON DEFICIENCY 06/23/2007   Ataxia 02/14/2020   B12 deficiency 06/13/2020   Balance problem 01/10/2020   BENIGN PROSTATIC HYPERTROPHY 06/23/2007   Bilateral leg pain 02/14/2020   BPH (benign prostatic hyperplasia) 06/23/2007   Qualifier: Diagnosis of  By: Paulina Fusi, RN, Daine Gravel   Formatting of this note might be different from the original. Formatting of this note might be different from the original. Qualifier: Diagnosis of  By: Paulina Fusi, RN, Daine Gravel   Chronic kidney disease 10/18/2016   Degeneration of lumbar intervertebral disc 06/30/2018   Diverticulosis    Elevated diaphragm 04/15/2018   Essential hypertension 04/15/2018   Gastro-esophageal reflux disease without esophagitis 06/23/2007   Qualifier: Diagnosis of  By: Paulina Fusi, RN, Daine Gravel   Formatting of this note might be different from the original. Formatting of this note might be different from the original. Qualifier: Diagnosis of  By: Paulina Fusi, RN, Daine Gravel   Hypertension    Low back pain 03/09/2019   Lumbar radiculopathy 05/06/2019   Lumbosacral spondylosis without myelopathy 03/10/2019   MGUS (monoclonal gammopathy of unknown significance)    Monoclonal gammopathy of unknown significance (MGUS) 08/24/2018   Multiple renal cysts 04/06/2018   Night sweats 03/13/2018   Formatting of this note might be different from the original. Last Assessment & Plan:  Formatting  of this note might be different from the original. It appears as if these have resolved now but were part of the constellation of presenting symptoms. Could reflect infectious, metabolic/endocrine, autoimmune process contributing.   Nonsustained ventricular tachycardia ([place]) 11/03/2019   Polycystic kidney disease    stage 3 per wife   Primary osteoarthritis of both knees 06/30/2018   Bilateral moderate with moderate chondromalacia patella Formatting of this note might be different from the original. Formatting of this note might be different from the original. Bilateral moderate with moderate chondromalacia patella   Stage 3b chronic kidney disease ([place]) 06/21/2020   Weakness of both lower extremities 01/10/2020    Objective BP (!) 140/72   Pulse 92   Temp 98.3 F (36.8 C) (Oral)   Ht 6' (1.829 m)   Wt 191 lb (86.6 kg)   SpO2 93%   BMI 25.90 kg/m  General: Awake, alert, appears stated age HEENT: AT, [place], ears patent b/l and TM's neg, nares patent w/o discharge, pharynx pink and without exudates, MMM; mild ttp over frontal sinuses b/l Neck: No masses or asymmetry Heart: RRR Lungs: CTAB, no accessory muscle use Psych: Age appropriate judgment and insight, normal mood and affect  Acute frontal sinusitis, recurrence not specified - Plan: methylPREDNISolone acetate (DEPO-MEDROL) injection 80 mg  Depo injection IM today. 7 d of doxy if no improvement by 2 d. Suspect allergic issue, but could be additional infection given worsening. Continue to push fluids, practice good hand hygiene, cover mouth when coughing. F/u prn. If starting to experience fevers, shaking, or shortness of  breath, seek immediate care. Pt voiced understanding and agreement to the plan.  [place], DO 07/03/22 12:05 PM

## 2022-07-03 NOTE — Patient Instructions (Addendum)
Continue to push fluids, practice good hand hygiene, and cover your mouth if you cough.  If you start having fevers, shaking or shortness of breath, seek immediate care.  OK to take Tylenol 1000 mg (2 extra strength tabs) or 975 mg (3 regular strength tabs) every 6 hours as needed.  Prior to yard work, might not be a bad idea to take a hit of a nasal spray like Flonase.   If no better by Friday, take the doxycycline.   Let us know if you need anything.

## 2022-07-09 ENCOUNTER — Ambulatory Visit: Payer: Medicare Other | Admitting: Cardiology

## 2022-07-18 ENCOUNTER — Other Ambulatory Visit: Payer: Self-pay | Admitting: Internal Medicine

## 2022-08-13 ENCOUNTER — Encounter: Payer: Self-pay | Admitting: Family Medicine

## 2022-08-13 ENCOUNTER — Ambulatory Visit (INDEPENDENT_AMBULATORY_CARE_PROVIDER_SITE_OTHER): Payer: Medicare Other

## 2022-08-13 DIAGNOSIS — Z23 Encounter for immunization: Secondary | ICD-10-CM

## 2022-08-14 DIAGNOSIS — N1832 Chronic kidney disease, stage 3b: Secondary | ICD-10-CM | POA: Diagnosis not present

## 2022-08-19 ENCOUNTER — Inpatient Hospital Stay: Payer: Medicare Other

## 2022-08-19 ENCOUNTER — Inpatient Hospital Stay: Payer: Medicare Other | Admitting: Hematology & Oncology

## 2022-08-20 DIAGNOSIS — I129 Hypertensive chronic kidney disease with stage 1 through stage 4 chronic kidney disease, or unspecified chronic kidney disease: Secondary | ICD-10-CM | POA: Diagnosis not present

## 2022-08-20 DIAGNOSIS — Q619 Cystic kidney disease, unspecified: Secondary | ICD-10-CM | POA: Diagnosis not present

## 2022-08-20 DIAGNOSIS — N1832 Chronic kidney disease, stage 3b: Secondary | ICD-10-CM | POA: Diagnosis not present

## 2022-08-27 ENCOUNTER — Telehealth: Payer: Self-pay | Admitting: Family Medicine

## 2022-08-27 MED ORDER — PANTOPRAZOLE SODIUM 40 MG PO TBEC: 40.0000 mg | DELAYED_RELEASE_TABLET | Freq: Every day | ORAL | 3 refills | Status: DC | PRN

## 2022-08-27 NOTE — Telephone Encounter (Signed)
Sent!

## 2022-08-27 NOTE — Telephone Encounter (Signed)
Patients wife informed sent in.

## 2022-08-27 NOTE — Telephone Encounter (Signed)
Patient's wife, Alex Dunn, called to see if Dr. Nani Ravens would call in another refill of his Protonix (the prescribing doctor has since retired). He has been having "stomach rolling, pain, issues with bowel movements."   Medication: pantoprazole (PROTONIX) 40 MG tablet    Preferred Pharmacy (with phone number or street name): [id number] [address], [place] AT [facility] OF [address] [place], [place] [address] Phone: [phone]  Fax: [phone]   Agent: Please be advised that RX refills may take up to 3 business days. We ask that you follow-up with your pharmacy.

## 2022-09-02 ENCOUNTER — Telehealth: Payer: Self-pay | Admitting: *Deleted

## 2022-09-02 ENCOUNTER — Encounter (INDEPENDENT_AMBULATORY_CARE_PROVIDER_SITE_OTHER): Payer: Self-pay

## 2022-09-02 NOTE — Telephone Encounter (Signed)
Per staff message Sheria Lang per Dr. Marin Olp - called patient and lvm about rescheduling appointment with Dr. Marin Olp, requested callback to confirm appointment.

## 2022-09-17 DIAGNOSIS — G629 Polyneuropathy, unspecified: Secondary | ICD-10-CM | POA: Diagnosis not present

## 2022-09-20 ENCOUNTER — Other Ambulatory Visit: Payer: Medicare Other

## 2022-09-20 ENCOUNTER — Ambulatory Visit: Payer: Medicare Other | Admitting: Hematology & Oncology

## 2022-09-25 ENCOUNTER — Ambulatory Visit: Payer: Medicare Other | Admitting: Hematology & Oncology

## 2022-09-25 ENCOUNTER — Other Ambulatory Visit: Payer: Medicare Other

## 2022-09-30 ENCOUNTER — Encounter: Payer: Self-pay | Admitting: Hematology & Oncology

## 2022-09-30 ENCOUNTER — Inpatient Hospital Stay: Payer: Medicare Other | Attending: Hematology & Oncology

## 2022-09-30 ENCOUNTER — Inpatient Hospital Stay (HOSPITAL_BASED_OUTPATIENT_CLINIC_OR_DEPARTMENT_OTHER): Payer: Medicare Other | Admitting: Hematology & Oncology

## 2022-09-30 ENCOUNTER — Other Ambulatory Visit: Payer: Self-pay

## 2022-09-30 VITALS — BP 141/68 | HR 72 | Temp 98.8°F | Resp 17 | Wt 189.0 lb

## 2022-09-30 DIAGNOSIS — D472 Monoclonal gammopathy: Secondary | ICD-10-CM

## 2022-09-30 DIAGNOSIS — G629 Polyneuropathy, unspecified: Secondary | ICD-10-CM | POA: Diagnosis not present

## 2022-09-30 LAB — CBC WITH DIFFERENTIAL (CANCER CENTER ONLY)
Abs Immature Granulocytes: 0.03 10*3/uL (ref 0.00–0.07)
Basophils Absolute: 0.1 10*3/uL (ref 0.0–0.1)
Basophils Relative: 1 %
Eosinophils Absolute: 0.2 10*3/uL (ref 0.0–0.5)
Eosinophils Relative: 3 %
HCT: 43.8 % (ref 39.0–52.0)
Hemoglobin: 13.9 g/dL (ref 13.0–17.0)
Immature Granulocytes: 0 %
Lymphocytes Relative: 20 %
Lymphs Abs: 1.6 10*3/uL (ref 0.7–4.0)
MCH: 31.9 pg (ref 26.0–34.0)
MCHC: 31.7 g/dL (ref 30.0–36.0)
MCV: 100.5 fL — ABNORMAL HIGH (ref 80.0–100.0)
Monocytes Absolute: 0.6 10*3/uL (ref 0.1–1.0)
Monocytes Relative: 8 %
Neutro Abs: 5.5 10*3/uL (ref 1.7–7.7)
Neutrophils Relative %: 68 %
Platelet Count: 182 10*3/uL (ref 150–400)
RBC: 4.36 MIL/uL (ref 4.22–5.81)
RDW: 13.1 % (ref 11.5–15.5)
WBC Count: 8 10*3/uL (ref 4.0–10.5)
nRBC: 0 % (ref 0.0–0.2)

## 2022-09-30 LAB — CMP (CANCER CENTER ONLY)
ALT: 14 U/L (ref 0–44)
AST: 14 U/L — ABNORMAL LOW (ref 15–41)
Albumin: 4.1 g/dL (ref 3.5–5.0)
Alkaline Phosphatase: 123 U/L (ref 38–126)
Anion gap: 7 (ref 5–15)
BUN: 29 mg/dL — ABNORMAL HIGH (ref 8–23)
CO2: 29 mmol/L (ref 22–32)
Calcium: 9.4 mg/dL (ref 8.9–10.3)
Chloride: 103 mmol/L (ref 98–111)
Creatinine: 1.7 mg/dL — ABNORMAL HIGH (ref 0.61–1.24)
GFR, Estimated: 39 mL/min — ABNORMAL LOW (ref >60)
Glucose, Bld: 109 mg/dL — ABNORMAL HIGH (ref 70–99)
Potassium: 4.3 mmol/L (ref 3.5–5.1)
Sodium: 139 mmol/L (ref 135–145)
Total Bilirubin: 0.5 mg/dL (ref 0.3–1.2)
Total Protein: 6.7 g/dL (ref 6.5–8.1)

## 2022-09-30 LAB — LACTATE DEHYDROGENASE: LDH: 106 U/L (ref 98–192)

## 2022-09-30 NOTE — Progress Notes (Signed)
Hematology and Oncology Follow Up Visit  Alex Dunn [id number] January 30, 1936 86 y.o. 09/30/2022   Principle Diagnosis:  IgA kappa MGUS   Current Therapy:        Observation   Interim History:  Alex Dunn is here today for follow-up.  He is still bothered quite a bit by this neuropathy.  I think this is some that is multifactorial.  He has had a lot of back issues.  He said he had epidural steroids done recently.  This did seem to help a little bit.  He is going to go see a neurosurgeon about a spine stimulator.  This is going to be temporary when it first.  If this works, then a program 1 will be placed.  I just do not think that the neuropathy is related to this MGUS.  However, it certainly could be.  I think the only way to know is actually doing nerve biopsy.  I am unsure if nerve biopsies are done anymore.  He has had a very thorough workup.  He did have a 24-hour urine that was done back in January of this year.  This was negative for any monoclonal spike.  I will repeat a 24-hour urine on him.  If there is a monoclonal spike, that he is by go need to have another bone marrow test done.  His last monoclonal studies back in June did not show monoclonal spike in his blood.  His IgA level was 490 mg/dL.  He he comes by himself.  We last saw him back in January.  His neuropathy has been his biggest problem.  I just wish there is some that we could do to try to help this out.  We see him for the IgA kappa MGUS.  When we last saw him, there was no monoclonal spike in his blood.  His IgA level was 490 mg/dL.  The Kappa light chain was 6.7 mg/dL.  He recently had a flu shot.  He did not feel that will afterwards.  He has had no problems with bowels or bladder.  He has had no rashes.  He has had no weight loss or weight gain.  Overall, I would say his performance status is probably ECOG 2.   Medications:  Allergies as of 09/30/2022       Reactions   Amitriptyline Other  (See Comments)   SLEPT FOR 18-19 HOURS STRAIGHT   Codeine Phosphate Other (See Comments)   Seen odd things crawling up wall   Pregabalin Other (See Comments)   Drowsy, nightmares, stumbling   Lexapro [escitalopram Oxalate] Other (See Comments)   shaking        Medication List        Accurate as of September 30, 2022  3:23 PM. If you have any questions, ask your nurse or doctor.          acetaminophen 325 MG tablet Commonly known as: TYLENOL Take 2 tablets by mouth 4 (four) times daily as needed (pain).   aspirin EC 81 MG tablet Take 81 mg by mouth daily. Swallow whole.   atorvastatin 10 MG tablet Commonly known as: LIPITOR Take 1 tablet (10 mg total) by mouth daily.   cyanocobalamin 1000 MCG tablet Take by mouth.   flecainide 50 MG tablet Commonly known as: TAMBOCOR TAKE 1 TABLET(50 MG) BY MOUTH TWICE DAILY   gabapentin 300 MG capsule Commonly known as: NEURONTIN Take 300 mg by mouth 3 (three) times daily.   HYDROcodone-acetaminophen 5-325 MG  tablet Commonly known as: NORCO/VICODIN Take 1 tablet by mouth 2 (two) times daily as needed.   ICAPS AREDS 2 PO Take 1 each by mouth daily.   losartan 50 MG tablet Commonly known as: COZAAR TAKE 1 TABLET(50 MG) BY MOUTH DAILY   methocarbamol 500 MG tablet Commonly known as: ROBAXIN Take 1 tablet (500 mg total) by mouth 2 (two) times daily as needed for muscle spasms.   pantoprazole 40 MG tablet Commonly known as: PROTONIX Take 1 tablet (40 mg total) by mouth daily as needed.   traMADol 50 MG tablet Commonly known as: ULTRAM TAKE 1 TABLET(50 MG) BY MOUTH EVERY 12 HOURS AS NEEDED FOR MODERATE PAIN   VITAMIN D PO Take 2,000 Int'l Units by mouth daily.        Allergies:  Allergies  Allergen Reactions   Amitriptyline Other (See Comments)    SLEPT FOR 18-19 HOURS STRAIGHT   Codeine Phosphate Other (See Comments)    Seen odd things crawling up wall   Pregabalin Other (See Comments)    Drowsy, nightmares,  stumbling   Lexapro [Escitalopram Oxalate] Other (See Comments)    shaking    Past Medical History, Surgical history, Social history, and Family History were reviewed and updated.  Review of Systems: Review of Systems  Constitutional: Negative.   HENT: Negative.    Eyes: Negative.   Respiratory: Negative.    Cardiovascular: Negative.   Gastrointestinal: Negative.   Genitourinary: Negative.   Musculoskeletal: Negative.   Skin: Negative.   Neurological:  Positive for tingling.  Endo/Heme/Allergies: Negative.   Psychiatric/Behavioral: Negative.        Physical Exam:  weight is 189 lb (85.7 kg). His oral temperature is 98.8 F (37.1 C). His blood pressure is 141/68 (abnormal) and his pulse is 72. His respiration is 17 and oxygen saturation is 99%.   Wt Readings from Last 3 Encounters:  09/30/22 189 lb (85.7 kg)  07/03/22 191 lb (86.6 kg)  06/05/22 193 lb (87.5 kg)   Physical Exam Vitals reviewed.  HENT:     Head: Normocephalic and atraumatic.  Eyes:     Pupils: Pupils are equal, round, and reactive to light.  Cardiovascular:     Rate and Rhythm: Normal rate and regular rhythm.     Heart sounds: Normal heart sounds.  Pulmonary:     Effort: Pulmonary effort is normal.     Breath sounds: Normal breath sounds.  Abdominal:     General: Bowel sounds are normal.     Palpations: Abdomen is soft.  Musculoskeletal:        General: No tenderness or deformity. Normal range of motion.     Cervical back: Normal range of motion.  Lymphadenopathy:     Cervical: No cervical adenopathy.  Skin:    General: Skin is warm and dry.     Findings: No erythema or rash.  Neurological:     Mental Status: He is alert and oriented to person, place, and time.  Psychiatric:        Behavior: Behavior normal.        Thought Content: Thought content normal.        Judgment: Judgment normal.      Lab Results  Component Value Date   WBC 8.0 09/30/2022   HGB 13.9 09/30/2022   HCT 43.8  09/30/2022   MCV 100.5 (H) 09/30/2022   PLT 182 09/30/2022   Lab Results  Component Value Date   FERRITIN 72 09/06/2021   IRON 87  09/06/2021   TIBC 310 09/06/2021   UIBC 223 09/06/2021   IRONPCTSAT 28 09/06/2021   Lab Results  Component Value Date   RETICCTPCT 0.8 08/12/2018   RBC 4.36 09/30/2022   Lab Results  Component Value Date   KPAFRELGTCHN 66.8 (H) 04/11/2022   LAMBDASER 23.0 04/11/2022   KAPLAMBRATIO 2.90 (H) 04/11/2022   Lab Results  Component Value Date   IGGSERUM 923 04/11/2022   IGA 490 (H) 04/11/2022   IGMSERUM 47 04/11/2022   Lab Results  Component Value Date   TOTALPROTELP 6.5 04/11/2022   ALBUMINELP 3.4 04/11/2022   A1GS 0.2 04/11/2022   A2GS 0.8 04/11/2022   BETS 1.2 04/11/2022   GAMS 0.9 04/11/2022   MSPIKE Not Observed 04/11/2022   SPEI Comment 09/06/2021     Chemistry      Component Value Date/Time   NA 139 09/30/2022 1428   NA 142 07/01/2019 1154   K 4.3 09/30/2022 1428   CL 103 09/30/2022 1428   CO2 29 09/30/2022 1428   BUN 29 (H) 09/30/2022 1428   BUN 25 07/01/2019 1154   CREATININE 1.70 (H) 09/30/2022 1428   CREATININE 1.61 (H) 02/26/2019 1511      Component Value Date/Time   CALCIUM 9.4 09/30/2022 1428   ALKPHOS 123 09/30/2022 1428   AST 14 (L) 09/30/2022 1428   ALT 14 09/30/2022 1428   BILITOT 0.5 09/30/2022 1428       Impression and Plan: Alex Dunn is a very pleasant 86 yo caucasian gentleman with IgA kappa MGUS.  There is no obvious measurable monoclonal spike in his blood last him that we saw him.  He will be interesting to see what the level is at this time.  I think that if the 24-hour urine does show a monoclonal spike, he will need another bone marrow test.  His last bone marrow biopsy test was back in 2019.  Again I just cannot imagine that this neuropathy is from the MGUS.  Again I would think that a sural nerve biopsy would not be a bad idea but I do not know if the still do this.  I am going to have to get him  back probably in January.  There is a lot going on.  I just feel bad that his quality of life is now doing all that well.   Volanda Napoleon, MD 11/27/20233:23 PM

## 2022-10-01 DIAGNOSIS — G629 Polyneuropathy, unspecified: Secondary | ICD-10-CM | POA: Diagnosis not present

## 2022-10-01 LAB — IGG, IGA, IGM
IgA: 462 mg/dL — ABNORMAL HIGH (ref 61–437)
IgG (Immunoglobin G), Serum: 845 mg/dL (ref 603–1613)
IgM (Immunoglobulin M), Srm: 59 mg/dL (ref 15–143)

## 2022-10-01 LAB — KAPPA/LAMBDA LIGHT CHAINS
Kappa free light chain: 58.1 mg/L — ABNORMAL HIGH (ref 3.3–19.4)
Kappa, lambda light chain ratio: 2.08 — ABNORMAL HIGH (ref 0.26–1.65)
Lambda free light chains: 28 mg/L — ABNORMAL HIGH (ref 5.7–26.3)

## 2022-10-02 LAB — PROTEIN ELECTROPHORESIS, SERUM, WITH REFLEX
A/G Ratio: 1.1 (ref 0.7–1.7)
Albumin ELP: 3.4 g/dL (ref 2.9–4.4)
Alpha-1-Globulin: 0.3 g/dL (ref 0.0–0.4)
Alpha-2-Globulin: 0.8 g/dL (ref 0.4–1.0)
Beta Globulin: 1.2 g/dL (ref 0.7–1.3)
Gamma Globulin: 0.7 g/dL (ref 0.4–1.8)
Globulin, Total: 3 g/dL (ref 2.2–3.9)
Total Protein ELP: 6.4 g/dL (ref 6.0–8.5)

## 2022-10-02 LAB — BETA 2 MICROGLOBULIN, SERUM: Beta-2 Microglobulin: 4.2 mg/L — ABNORMAL HIGH (ref 0.6–2.4)

## 2022-10-03 DIAGNOSIS — D472 Monoclonal gammopathy: Secondary | ICD-10-CM | POA: Diagnosis not present

## 2022-10-07 LAB — UPEP/UIFE/LIGHT CHAINS/TP, 24-HR UR
% BETA, Urine: 35.5 %
ALPHA 1 URINE: 6.9 %
Albumin, U: 20.9 %
Alpha 2, Urine: 15.9 %
Free Kappa Lt Chains,Ur: 59.59 mg/L (ref 1.17–86.46)
Free Kappa/Lambda Ratio: 11.29 (ref 1.83–14.26)
Free Lambda Lt Chains,Ur: 5.28 mg/L (ref 0.27–15.21)
GAMMA GLOBULIN URINE: 20.8 %
Total Protein, Urine-Ur/day: 92 mg/24 hr (ref 30–150)
Total Protein, Urine: 6.8 mg/dL
Total Volume: 1350

## 2022-10-08 ENCOUNTER — Ambulatory Visit: Payer: Medicare Other | Admitting: Cardiology

## 2022-10-18 DIAGNOSIS — F4542 Pain disorder with related psychological factors: Secondary | ICD-10-CM | POA: Diagnosis not present

## 2022-11-07 ENCOUNTER — Encounter: Payer: Self-pay | Admitting: Cardiology

## 2022-11-07 ENCOUNTER — Ambulatory Visit: Payer: Medicare Other | Attending: Cardiology | Admitting: Cardiology

## 2022-11-07 VITALS — BP 130/70 | HR 67 | Ht 72.0 in | Wt 197.0 lb

## 2022-11-07 DIAGNOSIS — Q613 Polycystic kidney, unspecified: Secondary | ICD-10-CM | POA: Diagnosis not present

## 2022-11-07 DIAGNOSIS — I4729 Other ventricular tachycardia: Secondary | ICD-10-CM | POA: Diagnosis not present

## 2022-11-07 DIAGNOSIS — I493 Ventricular premature depolarization: Secondary | ICD-10-CM

## 2022-11-07 DIAGNOSIS — I10 Essential (primary) hypertension: Secondary | ICD-10-CM

## 2022-11-07 DIAGNOSIS — R0609 Other forms of dyspnea: Secondary | ICD-10-CM

## 2022-11-07 NOTE — Patient Instructions (Signed)

## 2022-11-07 NOTE — Addendum Note (Signed)
Addended by: Jacobo Forest D on: 11/07/2022 04:02 PM   Modules accepted: Orders

## 2022-11-07 NOTE — Progress Notes (Signed)
Cardiology Office Note:    Date:  11/07/2022   ID:  Alex Dunn, DOB 05-Oct-1936, MRN [id number]  PCP:  Alex Pal, DO  Cardiologist:  Alex Campus, MD    Referring MD: Alex Dunn*   Chief Complaint  Patient presents with   Follow-up    History of Present Illness:    Alex Dunn is a 87 y.o. male with past medical history significant for nonsustained ventricular tachycardia, PVC burden of 16, echocardiogram showed preserved ejection fraction, he was referred to EP team he was put on flecainide seems to be doing quite well overall doing well he is able to walk climb stairs still works some as a Psychologist, occupational.  Enjoying it.  He described his transient episodes that he can feel like cold symptoms for few days and then everything comes back to normal.  Nobody can figure out what is wrong.  Past Medical History:  Diagnosis Date   Abdominal aortic atherosclerosis ([place]) 04/15/2018   ANEMIA-IRON DEFICIENCY 06/23/2007   Ataxia 02/14/2020   B12 deficiency 06/13/2020   Balance problem 01/10/2020   BENIGN PROSTATIC HYPERTROPHY 06/23/2007   Bilateral leg pain 02/14/2020   BPH (benign prostatic hyperplasia) 06/23/2007   Qualifier: Diagnosis of  By: Paulina Fusi, RN, Daine Gravel   Formatting of this note might be different from the original. Formatting of this note might be different from the original. Qualifier: Diagnosis of  By: Paulina Fusi, RN, Daine Gravel   Chronic kidney disease 10/18/2016   Degeneration of lumbar intervertebral disc 06/30/2018   Diverticulosis    Elevated diaphragm 04/15/2018   Essential hypertension 04/15/2018   Gastro-esophageal reflux disease without esophagitis 06/23/2007   Qualifier: Diagnosis of  By: Paulina Fusi, RN, Daine Gravel   Formatting of this note might be different from the original. Formatting of this note might be different from the original. Qualifier: Diagnosis of  By: Paulina Fusi, RN, Daine Gravel   Hypertension    Low back pain 03/09/2019   Lumbar radiculopathy  05/06/2019   Lumbosacral spondylosis without myelopathy 03/10/2019   MGUS (monoclonal gammopathy of unknown significance)    Monoclonal gammopathy of unknown significance (MGUS) 08/24/2018   Multiple renal cysts 04/06/2018   Night sweats 03/13/2018   Formatting of this note might be different from the original. Last Assessment & Plan:  Formatting of this note might be different from the original. It appears as if these have resolved now but were part of the constellation of presenting symptoms. Could reflect infectious, metabolic/endocrine, autoimmune process contributing.   Nonsustained ventricular tachycardia ([place]) 11/03/2019   Polycystic kidney disease    stage 3 per wife   Primary osteoarthritis of both knees 06/30/2018   Bilateral moderate with moderate chondromalacia patella Formatting of this note might be different from the original. Formatting of this note might be different from the original. Bilateral moderate with moderate chondromalacia patella   Stage 3b chronic kidney disease ([place]) 06/21/2020   Weakness of both lower extremities 01/10/2020    Past Surgical History:  Procedure Laterality Date   HAND SURGERY     LUNG SURGERY     for collapsed lung    Current Medications: Current Meds  Medication Sig   acetaminophen (TYLENOL) 325 MG tablet Take 2 tablets by mouth 4 (four) times daily as needed for mild pain or moderate pain (pain).   aspirin EC 81 MG tablet Take 81 mg by mouth daily. Swallow whole.   atorvastatin (LIPITOR) 10 MG tablet Take 1 tablet (10 mg total) by mouth  daily.   cyanocobalamin 1000 MCG tablet Take 1,000 mcg by mouth daily.   flecainide (TAMBOCOR) 50 MG tablet TAKE 1 TABLET(50 MG) BY MOUTH TWICE DAILY (Patient taking differently: Take 50 mg by mouth 2 (two) times daily. TAKE 1 TABLET(50 MG) BY MOUTH TWICE DAILY)   gabapentin (NEURONTIN) 300 MG capsule Take 300 mg by mouth 3 (three) times daily.   HYDROcodone-acetaminophen (NORCO/VICODIN) 5-325 MG tablet Take 1  tablet by mouth 2 (two) times daily as needed for moderate pain or severe pain.   losartan (COZAAR) 50 MG tablet TAKE 1 TABLET(50 MG) BY MOUTH DAILY (Patient taking differently: Take 50 mg by mouth daily.)   methocarbamol (ROBAXIN) 500 MG tablet Take 1 tablet (500 mg total) by mouth 2 (two) times daily as needed for muscle spasms.   Multiple Vitamins-Minerals (ICAPS AREDS 2 PO) Take 1 each by mouth daily.   pantoprazole (PROTONIX) 40 MG tablet Take 1 tablet (40 mg total) by mouth daily as needed. (Patient taking differently: Take 40 mg by mouth daily as needed Jerrye Bushy).)   traMADol (ULTRAM) 50 MG tablet TAKE 1 TABLET(50 MG) BY MOUTH EVERY 12 HOURS AS NEEDED FOR MODERATE PAIN (Patient taking differently: Take 50 mg by mouth every 12 (twelve) hours as needed for moderate pain or severe pain. TAKE 1 TABLET(50 MG) BY MOUTH EVERY 12 HOURS AS NEEDED FOR MODERATE PAIN)   VITAMIN D PO Take 2,000 Int'l Units by mouth daily.     Allergies:   Amitriptyline, Codeine phosphate, Pregabalin, and Lexapro [escitalopram oxalate]   Social History   Socioeconomic History   Marital status: Married    Spouse name: Not on file   Number of children: 1   Years of education: Not on file   Highest education level: High school graduate  Occupational History   Not on file  Tobacco Use   Smoking status: Never    Passive exposure: Never   Smokeless tobacco: Never  Vaping Use   Vaping Use: Never used  Substance and Sexual Activity   Alcohol use: Never   Drug use: Never   Sexual activity: Not on file  Other Topics Concern   Not on file  Social History Narrative   Lives at home with his wife   Retired   Right handed   Caffeine: about 2 cups daily   Social Determinants of Health   Financial Resource Strain: Low Risk  (01/08/2022)   Overall Financial Resource Strain (CARDIA)    Difficulty of Paying Living Expenses: Not hard at all  Food Insecurity: No Food Insecurity (01/08/2022)   Hunger Vital Sign    Worried  About Running Out of Food in the Last Year: Never true    [place] in the Last Year: Never true  Transportation Needs: No Transportation Needs (01/08/2022)   PRAPARE - Hydrologist (Medical): No    Lack of Transportation (Non-Medical): No  Physical Activity: Inactive (01/08/2022)   Exercise Vital Sign    Days of Exercise per Week: 0 days    Minutes of Exercise per Session: 0 min  Stress: No Stress Concern Present (01/08/2022)   [place]    Feeling of Stress : Not at all  Social Connections: Moderately Integrated (01/08/2022)   Social Connection and Isolation Panel [NHANES]    Frequency of Communication with Friends and Family: Three times a week    Frequency of Social Gatherings with Friends and Family: More than  three times a week    Attends Religious Services: More than 4 times per year    Active Member of Clubs or Organizations: No    Attends Archivist Meetings: Never    Marital Status: Married     Family History: The patient's family history includes Heart attack in his brother, father, and mother. There is no history of Neuromuscular disorder. ROS:   Please see the history of present illness.    All 14 point review of systems negative except as described per history of present illness  EKGs/Labs/Other Studies Reviewed:      Recent Labs: 09/30/2022: ALT 14; BUN 29; Creatinine 1.70; Hemoglobin 13.9; Platelet Count 182; Potassium 4.3; Sodium 139  Recent Lipid Panel    Component Value Date/Time   CHOL 140 06/05/2022 1354   CHOL 169 11/15/2020 1412   TRIG 103.0 06/05/2022 1354   HDL 44.10 06/05/2022 1354   HDL 37 (L) 11/15/2020 1412   CHOLHDL 3 06/05/2022 1354   VLDL 20.6 06/05/2022 1354   LDLCALC 76 06/05/2022 1354   LDLCALC 113 (H) 11/15/2020 1412    Physical Exam:    VS:  BP 130/70 (BP Location: Left Arm, Patient Position: Sitting)   Pulse 67   Ht 6' (1.829  m)   Wt 197 lb 0.6 oz (89.4 kg)   SpO2 98%   BMI 26.72 kg/m     Wt Readings from Last 3 Encounters:  11/07/22 197 lb 0.6 oz (89.4 kg)  09/30/22 189 lb (85.7 kg)  07/03/22 191 lb (86.6 kg)     GEN:  Well nourished, well developed in no acute distress HEENT: Normal NECK: No JVD; No carotid bruits LYMPHATICS: No lymphadenopathy CARDIAC: RRR, no murmurs, no rubs, no gallops RESPIRATORY:  Clear to auscultation without rales, wheezing or rhonchi  ABDOMEN: Soft, non-tender, non-distended MUSCULOSKELETAL:  No edema; No deformity  SKIN: Warm and dry LOWER EXTREMITIES: no swelling NEUROLOGIC:  Alert and oriented x 3 PSYCHIATRIC:  Normal affect   ASSESSMENT:    1. PVC's (premature ventricular contractions)   2. Nonsustained ventricular tachycardia ([place])   3. Primary hypertension   4. Polycystic kidney disease    PLAN:    In order of problems listed above:  History of nonsustained ventricular tachycardia with PVCs.  I will ask him to have echocardiogram done to assess left ventricle ejection fraction. Essential hypertension blood pressure well-controlled continue present management. Dyslipidemia I did review K PN which show me LDL of 76 HDL 44 we will continue with Lipitor 10.   Medication Adjustments/Labs and Tests Ordered: Current medicines are reviewed at length with the patient today.  Concerns regarding medicines are outlined above.  No orders of the defined types were placed in this encounter.  Medication changes: No orders of the defined types were placed in this encounter.   Signed, Park Liter, MD, [facility] 11/07/2022 3:58 PM    [place]

## 2022-11-22 ENCOUNTER — Other Ambulatory Visit: Payer: Self-pay | Admitting: *Deleted

## 2022-11-22 DIAGNOSIS — D472 Monoclonal gammopathy: Secondary | ICD-10-CM

## 2022-11-22 DIAGNOSIS — R61 Generalized hyperhidrosis: Secondary | ICD-10-CM

## 2022-11-25 ENCOUNTER — Inpatient Hospital Stay: Payer: Medicare Other | Attending: Hematology & Oncology

## 2022-11-25 ENCOUNTER — Encounter: Payer: Self-pay | Admitting: Family

## 2022-11-25 ENCOUNTER — Other Ambulatory Visit: Payer: Self-pay

## 2022-11-25 ENCOUNTER — Inpatient Hospital Stay (HOSPITAL_BASED_OUTPATIENT_CLINIC_OR_DEPARTMENT_OTHER): Payer: Medicare Other | Admitting: Family

## 2022-11-25 VITALS — BP 137/65 | HR 71 | Temp 98.3°F | Resp 18 | Ht 72.0 in | Wt 192.1 lb

## 2022-11-25 DIAGNOSIS — D472 Monoclonal gammopathy: Secondary | ICD-10-CM

## 2022-11-25 DIAGNOSIS — R61 Generalized hyperhidrosis: Secondary | ICD-10-CM

## 2022-11-25 LAB — CBC WITH DIFFERENTIAL (CANCER CENTER ONLY)
Abs Immature Granulocytes: 0.02 10*3/uL (ref 0.00–0.07)
Basophils Absolute: 0.1 10*3/uL (ref 0.0–0.1)
Basophils Relative: 1 %
Eosinophils Absolute: 0.2 10*3/uL (ref 0.0–0.5)
Eosinophils Relative: 3 %
HCT: 42.8 % (ref 39.0–52.0)
Hemoglobin: 13.7 g/dL (ref 13.0–17.0)
Immature Granulocytes: 0 %
Lymphocytes Relative: 24 %
Lymphs Abs: 1.6 10*3/uL (ref 0.7–4.0)
MCH: 31.9 pg (ref 26.0–34.0)
MCHC: 32 g/dL (ref 30.0–36.0)
MCV: 99.8 fL (ref 80.0–100.0)
Monocytes Absolute: 0.5 10*3/uL (ref 0.1–1.0)
Monocytes Relative: 8 %
Neutro Abs: 4.2 10*3/uL (ref 1.7–7.7)
Neutrophils Relative %: 64 %
Platelet Count: 169 10*3/uL (ref 150–400)
RBC: 4.29 MIL/uL (ref 4.22–5.81)
RDW: 13.1 % (ref 11.5–15.5)
WBC Count: 6.5 10*3/uL (ref 4.0–10.5)
nRBC: 0 % (ref 0.0–0.2)

## 2022-11-25 LAB — CMP (CANCER CENTER ONLY)
ALT: 15 U/L (ref 0–44)
AST: 15 U/L (ref 15–41)
Albumin: 4.3 g/dL (ref 3.5–5.0)
Alkaline Phosphatase: 113 U/L (ref 38–126)
Anion gap: 8 (ref 5–15)
BUN: 26 mg/dL — ABNORMAL HIGH (ref 8–23)
CO2: 29 mmol/L (ref 22–32)
Calcium: 9.6 mg/dL (ref 8.9–10.3)
Chloride: 104 mmol/L (ref 98–111)
Creatinine: 1.8 mg/dL — ABNORMAL HIGH (ref 0.61–1.24)
GFR, Estimated: 37 mL/min — ABNORMAL LOW (ref >60)
Glucose, Bld: 110 mg/dL — ABNORMAL HIGH (ref 70–99)
Potassium: 4.6 mmol/L (ref 3.5–5.1)
Sodium: 141 mmol/L (ref 135–145)
Total Bilirubin: 0.5 mg/dL (ref 0.3–1.2)
Total Protein: 7 g/dL (ref 6.5–8.1)

## 2022-11-25 LAB — LACTATE DEHYDROGENASE: LDH: 129 U/L (ref 98–192)

## 2022-11-25 NOTE — Progress Notes (Signed)
Hematology and Oncology Follow Up Visit  Alex Dunn [id number] [birth year] 87 y.o. 11/25/2022   Principle Diagnosis:  IgA kappa MGUS   Current Therapy:        Observation   Interim History:  [name] is here today for follow-up. He is doing fairly well. He still has neuropathy in his lower extremities that does effect his mobility and balance.  He states that he has seen several doctors with [facility] and will be having an external spinal stimulator put in on 2/8 for a 1 week trial. If this is successful, they plan to place a permanent one.  Thankfully he has not had any falls or syncope.  He note puffiness in his ankles that comes and goes. This resolves over night. He has associated urinary frequency at night as well.  No fever, chills, n/v, cough, rash, dizziness, SOB, chest pain, abdominal pain or changes in bowel or bladder habits.  He has occasional palpitations with PVC's. His cardiologist has started him on Tambocor and he will be having an ECHO soon.  No issues with abnormal blood loss, bruising or petechiae.  Appetite and hydration are good. Weight today is 192 lbs.  In November, No M-spike was detected, IgA level was 462 mg/dL, Beta-2 microglobulin was 4.2 mg/L. 24 hour urine at that time was negative.   ECOG Performance Status: 1 - Symptomatic but completely ambulatory  Medications:  Allergies as of 11/25/2022       Reactions   Amitriptyline Other (See Comments)   SLEPT FOR 18-19 HOURS STRAIGHT   Codeine Phosphate Other (See Comments)   Seen odd things crawling up wall   Pregabalin Other (See Comments)   Drowsy, nightmares, stumbling   Lexapro [escitalopram Oxalate] Other (See Comments)   shaking        Medication List        Accurate as of November 25, 2022  3:10 PM. If you have any questions, ask your nurse or doctor.          STOP taking these medications    gabapentin 300 MG capsule Commonly known as: NEURONTIN Stopped by: Lottie Dawson, NP    HYDROcodone-acetaminophen 5-325 MG tablet Commonly known as: NORCO/VICODIN Stopped by: Lottie Dawson, NP       TAKE these medications    acetaminophen 325 MG tablet Commonly known as: TYLENOL Take 2 tablets by mouth 4 (four) times daily as needed for mild pain or moderate pain (pain).   aspirin EC 81 MG tablet Take 81 mg by mouth daily. Swallow whole.   atorvastatin 10 MG tablet Commonly known as: LIPITOR Take 1 tablet (10 mg total) by mouth daily.   cyanocobalamin 1000 MCG tablet Take 1,000 mcg by mouth daily.   flecainide 50 MG tablet Commonly known as: TAMBOCOR TAKE 1 TABLET(50 MG) BY MOUTH TWICE DAILY What changed:  how much to take how to take this when to take this   ICAPS AREDS 2 PO Take 1 each by mouth daily.   losartan 50 MG tablet Commonly known as: COZAAR TAKE 1 TABLET(50 MG) BY MOUTH DAILY What changed: See the new instructions.   methocarbamol 500 MG tablet Commonly known as: ROBAXIN Take 1 tablet (500 mg total) by mouth 2 (two) times daily as needed for muscle spasms.   pantoprazole 40 MG tablet Commonly known as: PROTONIX Take 1 tablet (40 mg total) by mouth daily as needed. What changed: reasons to take this   traMADol 50 MG tablet Commonly known as: ULTRAM TAKE  1 TABLET(50 MG) BY MOUTH EVERY 12 HOURS AS NEEDED FOR MODERATE PAIN What changed:  how much to take how to take this when to take this reasons to take this   VITAMIN D PO Take 2,000 Int'l Units by mouth daily.        Allergies:  Allergies  Allergen Reactions   Amitriptyline Other (See Comments)    SLEPT FOR 18-19 HOURS STRAIGHT   Codeine Phosphate Other (See Comments)    Seen odd things crawling up wall   Pregabalin Other (See Comments)    Drowsy, nightmares, stumbling   Lexapro [Escitalopram Oxalate] Other (See Comments)    shaking    Past Medical History, Surgical history, Social history, and Family History were reviewed and updated.  Review of Systems: All  other 10 point review of systems is negative.   Physical Exam:  height is 6' (1.829 m) and weight is 192 lb 1.3 oz (87.1 kg). His oral temperature is 98.3 F (36.8 C). His blood pressure is 137/65 and his pulse is 71. His respiration is 18 and oxygen saturation is 100%.   Wt Readings from Last 3 Encounters:  11/25/22 192 lb 1.3 oz (87.1 kg)  11/07/22 197 lb 0.6 oz (89.4 kg)  09/30/22 189 lb (85.7 kg)    Ocular: Sclerae unicteric, pupils equal, round and reactive to light Ear-nose-throat: Oropharynx clear, dentition fair Lymphatic: No cervical or supraclavicular adenopathy Lungs no rales or rhonchi, good excursion bilaterally Heart regular rate and rhythm, no murmur appreciated Abd soft, nontender, positive bowel sounds MSK no focal spinal tenderness, no joint edema Neuro: non-focal, well-oriented, appropriate affect Breasts: Deferred   Lab Results  Component Value Date   WBC 6.5 11/25/2022   HGB 13.7 11/25/2022   HCT 42.8 11/25/2022   MCV 99.8 11/25/2022   PLT 169 11/25/2022   Lab Results  Component Value Date   FERRITIN 72 09/06/2021   IRON 87 09/06/2021   TIBC 310 09/06/2021   UIBC 223 09/06/2021   IRONPCTSAT 28 09/06/2021   Lab Results  Component Value Date   RETICCTPCT 0.8 08/12/2018   RBC 4.29 11/25/2022   Lab Results  Component Value Date   KPAFRELGTCHN 58.1 (H) 09/30/2022   LAMBDASER 28.0 (H) 09/30/2022   KAPLAMBRATIO 11.29 10/03/2022   Lab Results  Component Value Date   IGGSERUM 845 09/30/2022   IGA 462 (H) 09/30/2022   IGMSERUM 59 09/30/2022   Lab Results  Component Value Date   TOTALPROTELP 6.4 09/30/2022   ALBUMINELP 3.4 09/30/2022   A1GS 0.3 09/30/2022   A2GS 0.8 09/30/2022   BETS 1.2 09/30/2022   GAMS 0.7 09/30/2022   MSPIKE Not Observed 09/30/2022   SPEI Comment 09/06/2021     Chemistry      Component Value Date/Time   NA 139 09/30/2022 1428   NA 142 07/01/2019 1154   K 4.3 09/30/2022 1428   CL 103 09/30/2022 1428   CO2 29  09/30/2022 1428   BUN 29 (H) 09/30/2022 1428   BUN 25 07/01/2019 1154   CREATININE 1.70 (H) 09/30/2022 1428   CREATININE 1.61 (H) 02/26/2019 1511      Component Value Date/Time   CALCIUM 9.4 09/30/2022 1428   ALKPHOS 123 09/30/2022 1428   AST 14 (L) 09/30/2022 1428   ALT 14 09/30/2022 1428   BILITOT 0.5 09/30/2022 1428       Impression and Plan: Mr. Hoffer is a very pleasant 87 yo caucasian gentleman with IgA kappa MGUS.  Protein studies are pending.  We will plan to follow-up in 3 months.   Lottie Dawson, NP 1/22/20243:10 PM

## 2022-11-26 LAB — KAPPA/LAMBDA LIGHT CHAINS
Kappa free light chain: 63.4 mg/L — ABNORMAL HIGH (ref 3.3–19.4)
Kappa, lambda light chain ratio: 2.33 — ABNORMAL HIGH (ref 0.26–1.65)
Lambda free light chains: 27.2 mg/L — ABNORMAL HIGH (ref 5.7–26.3)

## 2022-11-26 LAB — IGG, IGA, IGM
IgA: 465 mg/dL — ABNORMAL HIGH (ref 61–437)
IgG (Immunoglobin G), Serum: 883 mg/dL (ref 603–1613)
IgM (Immunoglobulin M), Srm: 46 mg/dL (ref 15–143)

## 2022-11-27 ENCOUNTER — Encounter: Payer: Self-pay | Admitting: *Deleted

## 2022-11-28 LAB — PROTEIN ELECTROPHORESIS, SERUM
A/G Ratio: 1.7 (ref 0.7–1.7)
Albumin ELP: 4.1 g/dL (ref 2.9–4.4)
Alpha-1-Globulin: 0.2 g/dL (ref 0.0–0.4)
Alpha-2-Globulin: 0.6 g/dL (ref 0.4–1.0)
Beta Globulin: 1 g/dL (ref 0.7–1.3)
Gamma Globulin: 0.5 g/dL (ref 0.4–1.8)
Globulin, Total: 2.4 g/dL (ref 2.2–3.9)
Total Protein ELP: 6.5 g/dL (ref 6.0–8.5)

## 2022-12-05 DIAGNOSIS — H353132 Nonexudative age-related macular degeneration, bilateral, intermediate dry stage: Secondary | ICD-10-CM | POA: Diagnosis not present

## 2022-12-06 ENCOUNTER — Ambulatory Visit (HOSPITAL_BASED_OUTPATIENT_CLINIC_OR_DEPARTMENT_OTHER)
Admission: RE | Admit: 2022-12-06 | Discharge: 2022-12-06 | Disposition: A | Discharge status: Home / Self Care | Payer: Medicare Other | Source: Ambulatory Visit | Attending: Cardiology | Admitting: Cardiology

## 2022-12-06 DIAGNOSIS — R0609 Other forms of dyspnea: Secondary | ICD-10-CM

## 2022-12-06 DIAGNOSIS — I493 Ventricular premature depolarization: Secondary | ICD-10-CM | POA: Diagnosis not present

## 2022-12-07 LAB — ECHOCARDIOGRAM COMPLETE
AR max vel: 3.51 cm2
AV Area VTI: 3.15 cm2
AV Area mean vel: 3.12 cm2
AV Mean grad: 3 mmHg
AV Peak grad: 4.4 mmHg
Ao pk vel: 1.05 m/s
Area-P 1/2: 2.34 cm2
Calc EF: 60 %
MV M vel: 5.06 m/s
MV Peak grad: 102.4 mmHg
Single Plane A2C EF: 59.1 %
Single Plane A4C EF: 60.3 %

## 2022-12-12 DIAGNOSIS — G894 Chronic pain syndrome: Secondary | ICD-10-CM | POA: Diagnosis not present

## 2022-12-12 DIAGNOSIS — G629 Polyneuropathy, unspecified: Secondary | ICD-10-CM | POA: Diagnosis not present

## 2022-12-19 ENCOUNTER — Other Ambulatory Visit (HOSPITAL_BASED_OUTPATIENT_CLINIC_OR_DEPARTMENT_OTHER): Payer: Medicare Other

## 2022-12-31 ENCOUNTER — Other Ambulatory Visit: Payer: Self-pay | Admitting: Family Medicine

## 2022-12-31 DIAGNOSIS — M545 Low back pain, unspecified: Secondary | ICD-10-CM

## 2022-12-31 NOTE — Telephone Encounter (Signed)
Requesting: tramadol '50mg'$   Contract: None UDS: None Last Visit: 07/03/22 Next Visit: None Last Refill: 06/12/22 #90 and 0RF   Please Advise

## 2023-01-06 ENCOUNTER — Telehealth: Payer: Self-pay | Admitting: Family Medicine

## 2023-01-06 NOTE — Telephone Encounter (Signed)
Contacted [name] to schedule their annual wellness visit. Call back at later date: 01/13/2023  Sherol Dade; [facility] Direct Dial: [phone]

## 2023-01-14 ENCOUNTER — Ambulatory Visit (INDEPENDENT_AMBULATORY_CARE_PROVIDER_SITE_OTHER): Payer: Medicare Other | Admitting: Family Medicine

## 2023-01-14 ENCOUNTER — Encounter: Payer: Self-pay | Admitting: Family Medicine

## 2023-01-14 VITALS — BP 132/72 | HR 65 | Temp 98.0°F | Ht 72.0 in | Wt 193.0 lb

## 2023-01-14 DIAGNOSIS — E538 Deficiency of other specified B group vitamins: Secondary | ICD-10-CM | POA: Diagnosis not present

## 2023-01-14 DIAGNOSIS — I10 Essential (primary) hypertension: Secondary | ICD-10-CM | POA: Diagnosis not present

## 2023-01-14 DIAGNOSIS — Z0000 Encounter for general adult medical examination without abnormal findings: Secondary | ICD-10-CM | POA: Diagnosis not present

## 2023-01-14 DIAGNOSIS — N401 Enlarged prostate with lower urinary tract symptoms: Secondary | ICD-10-CM | POA: Diagnosis not present

## 2023-01-14 DIAGNOSIS — R351 Nocturia: Secondary | ICD-10-CM

## 2023-01-14 MED ORDER — TAMSULOSIN HCL 0.4 MG PO CAPS: 0.4000 mg | ORAL_CAPSULE | Freq: Every day | ORAL | 3 refills | Status: DC

## 2023-01-14 NOTE — Progress Notes (Signed)
Chief Complaint  Patient presents with   Annual Exam   Constipation    Well Male Alex Dunn is here for a complete physical.   His last physical was >1 year ago.  Current diet: in general, a "healthy" diet.   Current exercise: limited 2/2 neuropathy Weight trend: stable Fatigue out of ordinary? No. Seat belt? Yes.   Advanced directive? Yes  Health maintenance Shingrix- No Tetanus- Yes Pneumonia vaccine- Yes  BPH Hx of BPH. Several years of constipation and nocturia. No bleeding or pain. It is affecting his sleep. Denies being on Flomax historically.   Past Medical History:  Diagnosis Date   Abdominal aortic atherosclerosis ([place]) 04/15/2018   ANEMIA-IRON DEFICIENCY 06/23/2007   Ataxia 02/14/2020   B12 deficiency 06/13/2020   Balance problem 01/10/2020   BENIGN PROSTATIC HYPERTROPHY 06/23/2007   Bilateral leg pain 02/14/2020   BPH (benign prostatic hyperplasia) 06/23/2007   Qualifier: Diagnosis of  By: Paulina Fusi, RN, Daine Gravel   Formatting of this note might be different from the original. Formatting of this note might be different from the original. Qualifier: Diagnosis of  By: Paulina Fusi, RN, Daine Gravel   Chronic kidney disease 10/18/2016   Degeneration of lumbar intervertebral disc 06/30/2018   Diverticulosis    Elevated diaphragm 04/15/2018   Essential hypertension 04/15/2018   Gastro-esophageal reflux disease without esophagitis 06/23/2007   Qualifier: Diagnosis of  By: Paulina Fusi, RN, Daine Gravel   Formatting of this note might be different from the original. Formatting of this note might be different from the original. Qualifier: Diagnosis of  By: Paulina Fusi, RN, Daine Gravel   Hypertension    Low back pain 03/09/2019   Lumbar radiculopathy 05/06/2019   Lumbosacral spondylosis without myelopathy 03/10/2019   MGUS (monoclonal gammopathy of unknown significance)    Monoclonal gammopathy of unknown significance (MGUS) 08/24/2018   Multiple renal cysts 04/06/2018   Night sweats 03/13/2018   Formatting of this  note might be different from the original. Last Assessment & Plan:  Formatting of this note might be different from the original. It appears as if these have resolved now but were part of the constellation of presenting symptoms. Could reflect infectious, metabolic/endocrine, autoimmune process contributing.   Nonsustained ventricular tachycardia ([place]) 11/03/2019   Polycystic kidney disease    stage 3 per wife   Primary osteoarthritis of both knees 06/30/2018   Bilateral moderate with moderate chondromalacia patella Formatting of this note might be different from the original. Formatting of this note might be different from the original. Bilateral moderate with moderate chondromalacia patella   Stage 3b chronic kidney disease ([place]) 06/21/2020   Weakness of both lower extremities 01/10/2020     Past Surgical History:  Procedure Laterality Date   HAND SURGERY     LUNG SURGERY     for collapsed lung    Medications  Current Outpatient Medications on File Prior to Visit  Medication Sig Dispense Refill   acetaminophen (TYLENOL) 325 MG tablet Take 2 tablets by mouth 4 (four) times daily as needed for mild pain or moderate pain (pain).     aspirin EC 81 MG tablet Take 81 mg by mouth daily. Swallow whole.     atorvastatin (LIPITOR) 10 MG tablet Take 1 tablet (10 mg total) by mouth daily. 90 tablet 3   cyanocobalamin 1000 MCG tablet Take 1,000 mcg by mouth daily.     flecainide (TAMBOCOR) 50 MG tablet TAKE 1 TABLET(50 MG) BY MOUTH TWICE DAILY (Patient taking differently: Take 50 mg  by mouth 2 (two) times daily. TAKE 1 TABLET(50 MG) BY MOUTH TWICE DAILY) 180 tablet 3   losartan (COZAAR) 50 MG tablet TAKE 1 TABLET(50 MG) BY MOUTH DAILY (Patient taking differently: Take 50 mg by mouth daily.) 90 tablet 2   Multiple Vitamins-Minerals (ICAPS AREDS 2 PO) Take 1 each by mouth daily.     pantoprazole (PROTONIX) 40 MG tablet Take 1 tablet (40 mg total) by mouth daily as needed. (Patient taking differently: Take  40 mg by mouth daily as needed Jerrye Bushy).) 90 tablet 3   traMADol (ULTRAM) 50 MG tablet Take 1 tablet (50 mg total) by mouth every 12 (twelve) hours as needed for moderate pain or severe pain. TAKE 1 TABLET(50 MG) BY MOUTH EVERY 12 HOURS AS NEEDED FOR MODERATE PAIN 90 tablet 1   VITAMIN D PO Take 2,000 Int'l Units by mouth daily.      Allergies Allergies  Allergen Reactions   Amitriptyline Other (See Comments)    SLEPT FOR 18-19 HOURS STRAIGHT   Codeine Phosphate Other (See Comments)    Seen odd things crawling up wall   Pregabalin Other (See Comments)    Drowsy, nightmares, stumbling   Lexapro [Escitalopram Oxalate] Other (See Comments)    shaking    Family History Family History  Problem Relation Age of Onset   Heart attack Mother    Heart attack Father    Heart attack Brother        "leaky valve"   Neuromuscular disorder Neg Hx     Review of Systems: Constitutional:  no fevers Eye:  no recent significant change in vision Ears:  No changes in hearing Nose/Mouth/Throat:  no complaints of nasal congestion, no sore throat Cardiovascular: no chest pain Respiratory:  No shortness of breath Gastrointestinal:  +constipation GU:  +nighttime frequency Integumentary:  no abnormal skin lesions reported Neurologic:  no headaches Endocrine:  denies unexplained weight changes  Exam BP 132/72 (BP Location: Left Arm, Patient Position: Sitting, Cuff Size: Normal)   Pulse 65   Temp 98 F (36.7 C) (Oral)   Ht 6' (1.829 m)   Wt 193 lb (87.5 kg)   SpO2 96%   BMI 26.18 kg/m  General:  well developed, well nourished, in no apparent distress Skin:  no significant moles, warts, or growths Head:  no masses, lesions, or tenderness Eyes:  pupils equal and round, sclera anicteric without injection Ears:  canals without lesions, TMs shiny without retraction, no obvious effusion, no erythema Nose:  nares patent, mucosa normal Throat/Pharynx:  lips and gingiva without lesion; tongue and uvula  midline; non-inflamed pharynx; no exudates or postnasal drainage Lungs:  clear to auscultation, breath sounds equal bilaterally, no respiratory distress Cardio:  regular rate and rhythm, no LE edema or bruits Rectal: Deferred GI: BS+, S, NT, ND, no masses or organomegaly Musculoskeletal:  symmetrical muscle groups noted without atrophy or deformity Neuro:  gait normal; deep tendon reflexes normal and symmetric Psych: well oriented with normal range of affect and appropriate judgment/insight  Assessment and Plan  Well adult exam  Essential hypertension - Plan: Comprehensive metabolic panel, Lipid panel  B12 deficiency - Plan: B12  Benign prostatic hyperplasia with nocturia - Plan: tamsulosin (FLOMAX) 0.4 MG CAPS capsule   Well 87 y.o. male. Counseled on diet and exercise. Shingrix rec'd.  BPH: Chronic, uncontrolled. Start Flomax 0.4 mg/d. Warned about getting up slowly as orthostatic symptoms can worsen. F/u in 1 mo. Other orders as above. The patient voiced understanding and agreement to the  plan.  Shelda Pal, DO 01/14/23 2:35 PM

## 2023-01-14 NOTE — Patient Instructions (Addendum)
Give [place] 2-3 business days to get the results of your labs back.   Get up slowly on this new medication for urination.   Keep the diet clean and stay active.  Try to drink 55-60 oz of water daily outside of exercise.  Take Metamucil or Benefiber daily.  Try 2 tablespoons of milk of mag in 4 oz of warm prune juice. Do that and wait a couple hours. If no improvement, try a Dulcolax suppository and then let me know if we are still having issues.   The Shingrix vaccine (for shingles) is a 2 shot series spaced 2-6 months apart. It can make people feel low energy, achy and almost like they have the flu for 48 hours after injection. 1/5 people can have nausea and/or vomiting. Please plan accordingly when deciding on when to get this shot. Call your pharmacy to get this. The second shot of the series is less severe regarding the side effects, but it still lasts 48 hours.   Let us know if you need anything.

## 2023-01-15 LAB — COMPREHENSIVE METABOLIC PANEL
ALT: 14 U/L (ref 0–53)
AST: 16 U/L (ref 0–37)
Albumin: 3.6 g/dL (ref 3.5–5.2)
Alkaline Phosphatase: 106 U/L (ref 39–117)
BUN: 29 mg/dL — ABNORMAL HIGH (ref 6–23)
CO2: 28 mEq/L (ref 19–32)
Calcium: 9 mg/dL (ref 8.4–10.5)
Chloride: 105 mEq/L (ref 96–112)
Creatinine, Ser: 1.7 mg/dL — ABNORMAL HIGH (ref 0.40–1.50)
GFR: 36.53 mL/min — ABNORMAL LOW (ref >60.00)
Glucose, Bld: 96 mg/dL (ref 70–99)
Potassium: 4.3 mEq/L (ref 3.5–5.1)
Sodium: 140 mEq/L (ref 135–145)
Total Bilirubin: 0.5 mg/dL (ref 0.2–1.2)
Total Protein: 6.2 g/dL (ref 6.0–8.3)

## 2023-01-15 LAB — VITAMIN B12: Vitamin B-12: 927 pg/mL — ABNORMAL HIGH (ref 211–911)

## 2023-01-15 LAB — LIPID PANEL
Cholesterol: 117 mg/dL (ref 0–200)
HDL: 31.1 mg/dL — ABNORMAL LOW (ref >39.00)
LDL Cholesterol: 60 mg/dL (ref 0–99)
NonHDL: 85.69
Total CHOL/HDL Ratio: 4
Triglycerides: 127 mg/dL (ref 0.0–149.0)
VLDL: 25.4 mg/dL (ref 0.0–40.0)

## 2023-01-24 DIAGNOSIS — G629 Polyneuropathy, unspecified: Secondary | ICD-10-CM | POA: Diagnosis not present

## 2023-01-24 DIAGNOSIS — G894 Chronic pain syndrome: Secondary | ICD-10-CM | POA: Diagnosis not present

## 2023-01-24 HISTORY — PX: SPINAL CORD STIMULATOR IMPLANT: SHX2422

## 2023-02-14 ENCOUNTER — Encounter: Payer: Self-pay | Admitting: Family Medicine

## 2023-02-14 ENCOUNTER — Ambulatory Visit (INDEPENDENT_AMBULATORY_CARE_PROVIDER_SITE_OTHER): Payer: Medicare Other | Admitting: Family Medicine

## 2023-02-14 VITALS — BP 128/70 | HR 78 | Temp 98.4°F | Ht 72.0 in | Wt 193.0 lb

## 2023-02-14 DIAGNOSIS — N401 Enlarged prostate with lower urinary tract symptoms: Secondary | ICD-10-CM | POA: Diagnosis not present

## 2023-02-14 DIAGNOSIS — R351 Nocturia: Secondary | ICD-10-CM

## 2023-02-14 NOTE — Patient Instructions (Signed)
Send me a message in 1.5 weeks or sooner if side effects are not tolerable. Stay hydrated and get up slowly.   Let us know if you need anything.

## 2023-02-14 NOTE — Progress Notes (Signed)
Chief Complaint  Patient presents with   Follow-up    Subjective: Patient is a 87 y.o. male here for f/u.  He was seen around 1 month ago for physical and evaluation for BPH.  He was having nocturia.  He was started on Flomax 0.4 mg daily.  He took it for few days and then stopped it due to a spinal stimulator being implanted.  He has not taken it since then.  He did have some lightheadedness when standing, which we did talk about, during the few days he was on it.  He tries to stay well-hydrated.  No changes in his urination.  Past Medical History:  Diagnosis Date   Abdominal aortic atherosclerosis 04/15/2018   ANEMIA-IRON DEFICIENCY 06/23/2007   Ataxia 02/14/2020   B12 deficiency 06/13/2020   Balance problem 01/10/2020   BENIGN PROSTATIC HYPERTROPHY 06/23/2007   Bilateral leg pain 02/14/2020   BPH (benign prostatic hyperplasia) 06/23/2007   Qualifier: Diagnosis of  By: Marcelyn Ditty, RN, Katy Fitch   Formatting of this note might be different from the original. Formatting of this note might be different from the original. Qualifier: Diagnosis of  By: Marcelyn Ditty, RN, Katy Fitch   Chronic kidney disease 10/18/2016   Degeneration of lumbar intervertebral disc 06/30/2018   Diverticulosis    Elevated diaphragm 04/15/2018   Essential hypertension 04/15/2018   Gastro-esophageal reflux disease without esophagitis 06/23/2007   Qualifier: Diagnosis of  By: Marcelyn Ditty, RN, Katy Fitch   Formatting of this note might be different from the original. Formatting of this note might be different from the original. Qualifier: Diagnosis of  By: Marcelyn Ditty, RN, Katy Fitch   Hypertension    Low back pain 03/09/2019   Lumbar radiculopathy 05/06/2019   Lumbosacral spondylosis without myelopathy 03/10/2019   MGUS (monoclonal gammopathy of unknown significance)    Monoclonal gammopathy of unknown significance (MGUS) 08/24/2018   Multiple renal cysts 04/06/2018   Night sweats 03/13/2018   Formatting of this note might be different from the original. Last  Assessment & Plan:  Formatting of this note might be different from the original. It appears as if these have resolved now but were part of the constellation of presenting symptoms. Could reflect infectious, metabolic/endocrine, autoimmune process contributing.   Nonsustained ventricular tachycardia 11/03/2019   Polycystic kidney disease    stage 3 per wife   Primary osteoarthritis of both knees 06/30/2018   Bilateral moderate with moderate chondromalacia patella Formatting of this note might be different from the original. Formatting of this note might be different from the original. Bilateral moderate with moderate chondromalacia patella   Stage 3b chronic kidney disease 06/21/2020   Weakness of both lower extremities 01/10/2020    Objective: BP 128/70 (BP Location: Left Arm, Patient Position: Sitting, Cuff Size: Normal)   Pulse 78   Temp 98.4 F (36.9 C) (Oral)   Ht 6' (1.829 m)   Wt 193 lb (87.5 kg)   SpO2 96%   BMI 26.18 kg/m  General: Awake, appears stated age Heart: RRR, no LE edema Lungs: CTAB, no rales, wheezes or rhonchi. No accessory muscle use Abd: BS+, S, NT, ND Psych: Age appropriate judgment and insight, normal affect and mood  Assessment and Plan: Benign prostatic hyperplasia with nocturia  Chronic, unstable. Cont Flomax as he did not take it routinely. Had some orthostasis. He would like to try it longer as it was only 1-2 d last time. Going every 2-3 hrs at night. Send message in 1.5 weeks with how he is  doing.  Could consider changing to finasteride or referring to the urology team.  I will see him in 5 months or as needed at this point. The patient voiced understanding and agreement to the plan.  Jilda Roche Glenn, DO 02/14/23  12:50 PM

## 2023-02-19 DIAGNOSIS — N1832 Chronic kidney disease, stage 3b: Secondary | ICD-10-CM | POA: Diagnosis not present

## 2023-02-24 ENCOUNTER — Encounter: Payer: Self-pay | Admitting: Hematology & Oncology

## 2023-02-24 ENCOUNTER — Inpatient Hospital Stay: Payer: Medicare Other | Attending: Hematology & Oncology

## 2023-02-24 ENCOUNTER — Other Ambulatory Visit: Payer: Self-pay

## 2023-02-24 ENCOUNTER — Inpatient Hospital Stay (HOSPITAL_BASED_OUTPATIENT_CLINIC_OR_DEPARTMENT_OTHER): Payer: Medicare Other | Admitting: Hematology & Oncology

## 2023-02-24 VITALS — BP 136/64 | HR 63 | Temp 98.0°F | Resp 18 | Ht 72.0 in | Wt 193.0 lb

## 2023-02-24 DIAGNOSIS — D472 Monoclonal gammopathy: Secondary | ICD-10-CM

## 2023-02-24 LAB — CBC WITH DIFFERENTIAL (CANCER CENTER ONLY)
Abs Immature Granulocytes: 0.02 10*3/uL (ref 0.00–0.07)
Basophils Absolute: 0 10*3/uL (ref 0.0–0.1)
Basophils Relative: 1 %
Eosinophils Absolute: 0.2 10*3/uL (ref 0.0–0.5)
Eosinophils Relative: 3 %
HCT: 40.3 % (ref 39.0–52.0)
Hemoglobin: 13.2 g/dL (ref 13.0–17.0)
Immature Granulocytes: 0 %
Lymphocytes Relative: 23 %
Lymphs Abs: 1.4 10*3/uL (ref 0.7–4.0)
MCH: 31.8 pg (ref 26.0–34.0)
MCHC: 32.8 g/dL (ref 30.0–36.0)
MCV: 97.1 fL (ref 80.0–100.0)
Monocytes Absolute: 0.5 10*3/uL (ref 0.1–1.0)
Monocytes Relative: 8 %
Neutro Abs: 3.8 10*3/uL (ref 1.7–7.7)
Neutrophils Relative %: 65 %
Platelet Count: 166 10*3/uL (ref 150–400)
RBC: 4.15 MIL/uL — ABNORMAL LOW (ref 4.22–5.81)
RDW: 13 % (ref 11.5–15.5)
WBC Count: 5.9 10*3/uL (ref 4.0–10.5)
nRBC: 0 % (ref 0.0–0.2)

## 2023-02-24 LAB — LACTATE DEHYDROGENASE: LDH: 119 U/L (ref 98–192)

## 2023-02-24 LAB — CMP (CANCER CENTER ONLY)
ALT: 12 U/L (ref 0–44)
AST: 14 U/L — ABNORMAL LOW (ref 15–41)
Albumin: 3.7 g/dL (ref 3.5–5.0)
Alkaline Phosphatase: 110 U/L (ref 38–126)
Anion gap: 6 (ref 5–15)
BUN: 27 mg/dL — ABNORMAL HIGH (ref 8–23)
CO2: 29 mmol/L (ref 22–32)
Calcium: 8.8 mg/dL — ABNORMAL LOW (ref 8.9–10.3)
Chloride: 105 mmol/L (ref 98–111)
Creatinine: 1.79 mg/dL — ABNORMAL HIGH (ref 0.61–1.24)
GFR, Estimated: 37 mL/min — ABNORMAL LOW (ref >60)
Glucose, Bld: 100 mg/dL — ABNORMAL HIGH (ref 70–99)
Potassium: 4.4 mmol/L (ref 3.5–5.1)
Sodium: 140 mmol/L (ref 135–145)
Total Bilirubin: 0.4 mg/dL (ref 0.3–1.2)
Total Protein: 6.5 g/dL (ref 6.5–8.1)

## 2023-02-24 NOTE — Progress Notes (Signed)
Hematology and Oncology Follow Up Visit  Alex Dunn [id number] May 23, 1936 87 y.o. 02/24/2023   Principle Diagnosis:  IgA kappa MGUS   Current Therapy:        Observation   Interim History:  Alex Dunn is here today for follow-up.  He actually is doing much better.  He now has a spinal stimulator in.  This was put in about 5 weeks ago.  The neuropathy is much better.  I am so happy that everything is worked out well for him.  As such, I just do not think that this IgA monoclonal gammopathy is a problem with the neuropathy.  When we last saw him back in January, there is no monoclonal spike in his blood.  His IgA level was 465 mg/dL.  His Kappa light chain was 6.3 mg/dL.  Of note, back in November and we did a 24-hour urine on him.  There is no monoclonal spike of light chain in his urine.  Of note, his last vitamin B12 level was 927.  He has not been able to do all that much since he had the stimulator put in.  The doctor wants him to just take it easy for about 6-8 weeks.  He has had no issues with nausea or vomiting.  He has had no problems with cough or shortness of breath.  There is been no change in bowel or bladder habits.  He may have a little bit of constipation.  He has not had any problem with hot flashes or sweats.  Currently, I would say his performance status is probably ECOG 1.    Medications:  Allergies as of 02/24/2023       Reactions   Amitriptyline Other (See Comments)   SLEPT FOR 18-19 HOURS STRAIGHT   Codeine Phosphate Other (See Comments)   Seen odd things crawling up wall   Pregabalin Other (See Comments)   Drowsy, nightmares, stumbling   Lexapro [escitalopram Oxalate] Other (See Comments)   shaking        Medication List        Accurate as of February 24, 2023  3:31 PM. If you have any questions, ask your nurse or doctor.          acetaminophen 325 MG tablet Commonly known as: TYLENOL Take 2 tablets by mouth 4 (four) times daily as  needed for mild pain or moderate pain (pain).   aspirin EC 81 MG tablet Take 81 mg by mouth daily. Swallow whole.   atorvastatin 10 MG tablet Commonly known as: LIPITOR Take 1 tablet (10 mg total) by mouth daily.   cyanocobalamin 1000 MCG tablet Take 1,000 mcg by mouth daily.   flecainide 50 MG tablet Commonly known as: TAMBOCOR TAKE 1 TABLET(50 MG) BY MOUTH TWICE DAILY What changed:  how much to take how to take this when to take this   losartan 50 MG tablet Commonly known as: COZAAR TAKE 1 TABLET(50 MG) BY MOUTH DAILY What changed: See the new instructions.   pantoprazole 40 MG tablet Commonly known as: PROTONIX Take 1 tablet (40 mg total) by mouth daily as needed. What changed: reasons to take this   PreserVision AREDS Caps Take 1 capsule by mouth in the morning and at bedtime. What changed: Another medication with the same name was removed. Continue taking this medication, and follow the directions you see here. Changed by: Josph Macho, MD   tamsulosin 0.4 MG Caps capsule Commonly known as: FLOMAX Take 1 capsule (0.4 mg  total) by mouth daily.   traMADol 50 MG tablet Commonly known as: ULTRAM Take 1 tablet (50 mg total) by mouth every 12 (twelve) hours as needed for moderate pain or severe pain. TAKE 1 TABLET(50 MG) BY MOUTH EVERY 12 HOURS AS NEEDED FOR MODERATE PAIN   Vitamin D 50 MCG (2000 UT) Caps Take 2,000 Units by mouth daily at 6 (six) AM.   VITAMIN D PO Take 2,000 Int'l Units by mouth daily.        Allergies:  Allergies  Allergen Reactions   Amitriptyline Other (See Comments)    SLEPT FOR 18-19 HOURS STRAIGHT   Codeine Phosphate Other (See Comments)    Seen odd things crawling up wall   Pregabalin Other (See Comments)    Drowsy, nightmares, stumbling   Lexapro [Escitalopram Oxalate] Other (See Comments)    shaking    Past Medical History, Surgical history, Social history, and Family History were reviewed and updated.  Review of  Systems: Review of Systems  Constitutional: Negative.   HENT: Negative.    Eyes: Negative.   Respiratory: Negative.    Cardiovascular: Negative.   Gastrointestinal: Negative.   Genitourinary: Negative.   Musculoskeletal: Negative.   Skin: Negative.   Neurological:  Positive for tingling.  Endo/Heme/Allergies: Negative.   Psychiatric/Behavioral: Negative.        Physical Exam:  height is 6' (1.829 m) and weight is 193 lb 0.6 oz (87.6 kg). His oral temperature is 98 F (36.7 C). His blood pressure is 136/64 and his pulse is 63. His respiration is 18 and oxygen saturation is 100%.   Wt Readings from Last 3 Encounters:  02/24/23 193 lb 0.6 oz (87.6 kg)  02/14/23 193 lb (87.5 kg)  01/14/23 193 lb (87.5 kg)   Physical Exam Vitals reviewed.  HENT:     Head: Normocephalic and atraumatic.  Eyes:     Pupils: Pupils are equal, round, and reactive to light.  Cardiovascular:     Rate and Rhythm: Normal rate and regular rhythm.     Heart sounds: Normal heart sounds.  Pulmonary:     Effort: Pulmonary effort is normal.     Breath sounds: Normal breath sounds.  Abdominal:     General: Bowel sounds are normal.     Palpations: Abdomen is soft.  Musculoskeletal:        General: No tenderness or deformity. Normal range of motion.     Cervical back: Normal range of motion.  Lymphadenopathy:     Cervical: No cervical adenopathy.  Skin:    General: Skin is warm and dry.     Findings: No erythema or rash.  Neurological:     Mental Status: He is alert and oriented to person, place, and time.  Psychiatric:        Behavior: Behavior normal.        Thought Content: Thought content normal.        Judgment: Judgment normal.      Lab Results  Component Value Date   WBC 5.9 02/24/2023   HGB 13.2 02/24/2023   HCT 40.3 02/24/2023   MCV 97.1 02/24/2023   PLT 166 02/24/2023   Lab Results  Component Value Date   FERRITIN 72 09/06/2021   IRON 87 09/06/2021   TIBC 310 09/06/2021    UIBC 223 09/06/2021   IRONPCTSAT 28 09/06/2021   Lab Results  Component Value Date   RETICCTPCT 0.8 08/12/2018   RBC 4.15 (L) 02/24/2023   Lab Results  Component Value  Date   KPAFRELGTCHN 63.4 (H) 11/25/2022   LAMBDASER 27.2 (H) 11/25/2022   KAPLAMBRATIO 2.33 (H) 11/25/2022   Lab Results  Component Value Date   IGGSERUM 883 11/25/2022   IGA 465 (H) 11/25/2022   IGMSERUM 46 11/25/2022   Lab Results  Component Value Date   TOTALPROTELP 6.5 11/25/2022   ALBUMINELP 4.1 11/25/2022   A1GS 0.2 11/25/2022   A2GS 0.6 11/25/2022   BETS 1.0 11/25/2022   GAMS 0.5 11/25/2022   MSPIKE Not Observed 11/25/2022   SPEI Comment 11/25/2022     Chemistry      Component Value Date/Time   NA 140 02/24/2023 1428   NA 142 07/01/2019 1154   K 4.4 02/24/2023 1428   CL 105 02/24/2023 1428   CO2 29 02/24/2023 1428   BUN 27 (H) 02/24/2023 1428   BUN 25 07/01/2019 1154   CREATININE 1.79 (H) 02/24/2023 1428   CREATININE 1.61 (H) 02/26/2019 1511      Component Value Date/Time   CALCIUM 8.8 (L) 02/24/2023 1428   ALKPHOS 110 02/24/2023 1428   AST 14 (L) 02/24/2023 1428   ALT 12 02/24/2023 1428   BILITOT 0.4 02/24/2023 1428       Impression and Plan: Alex Dunn is a very pleasant 87 yo caucasian gentleman with IgA kappa MGUS.  There is no obvious measurable monoclonal spike in his blood last him that we saw him.  He will be interesting to see what the level is at this time.  I am just happy that the spinal stimulator has helped him.  His life is better now.  This is what is important.  I do not see that a bone marrow biopsy is necessary.  Hopefully, we can move his appointments out now.  I think we probably get him back in 6 months.  I think this would be reasonable.   Josph Macho, MD 4/22/20243:31 PM

## 2023-02-25 DIAGNOSIS — N1832 Chronic kidney disease, stage 3b: Secondary | ICD-10-CM | POA: Diagnosis not present

## 2023-02-25 DIAGNOSIS — I10 Essential (primary) hypertension: Secondary | ICD-10-CM | POA: Diagnosis not present

## 2023-02-25 DIAGNOSIS — Q619 Cystic kidney disease, unspecified: Secondary | ICD-10-CM | POA: Diagnosis not present

## 2023-02-25 LAB — IGG, IGA, IGM
IgA: 438 mg/dL — ABNORMAL HIGH (ref 61–437)
IgG (Immunoglobin G), Serum: 849 mg/dL (ref 603–1613)
IgM (Immunoglobulin M), Srm: 47 mg/dL (ref 15–143)

## 2023-02-25 LAB — BETA 2 MICROGLOBULIN, SERUM: Beta-2 Microglobulin: 3.9 mg/L — ABNORMAL HIGH (ref 0.6–2.4)

## 2023-02-25 LAB — KAPPA/LAMBDA LIGHT CHAINS
Kappa free light chain: 54.5 mg/L — ABNORMAL HIGH (ref 3.3–19.4)
Kappa, lambda light chain ratio: 2.02 — ABNORMAL HIGH (ref 0.26–1.65)
Lambda free light chains: 27 mg/L — ABNORMAL HIGH (ref 5.7–26.3)

## 2023-02-27 LAB — PROTEIN ELECTROPHORESIS, SERUM
A/G Ratio: 1.1 (ref 0.7–1.7)
Albumin ELP: 3.2 g/dL (ref 2.9–4.4)
Alpha-1-Globulin: 0.2 g/dL (ref 0.0–0.4)
Alpha-2-Globulin: 0.8 g/dL (ref 0.4–1.0)
Beta Globulin: 1 g/dL (ref 0.7–1.3)
Gamma Globulin: 0.9 g/dL (ref 0.4–1.8)
Globulin, Total: 2.9 g/dL (ref 2.2–3.9)
Total Protein ELP: 6.1 g/dL (ref 6.0–8.5)

## 2023-02-28 ENCOUNTER — Encounter: Payer: Self-pay | Admitting: *Deleted

## 2023-02-28 ENCOUNTER — Other Ambulatory Visit: Payer: Self-pay | Admitting: Family Medicine

## 2023-02-28 ENCOUNTER — Telehealth: Payer: Self-pay | Admitting: Family Medicine

## 2023-02-28 MED ORDER — FINASTERIDE 5 MG PO TABS
5.0000 mg | ORAL_TABLET | Freq: Every day | ORAL | 1 refills | Status: DC
Start: 2023-02-28 — End: 2023-03-03

## 2023-02-28 NOTE — Telephone Encounter (Signed)
Called the patient and he is ok try something else.

## 2023-02-28 NOTE — Telephone Encounter (Signed)
Patients wife called He has just started the Tamsulosin, has only taken one.  And felt bad all night long. He felt  like he had the flu, dizziness, just off balance, Up until the middle of the next day.  The patient if feeling much better now.  The patient thinks it is from the medication. He will not be taking any more of this medication.

## 2023-02-28 NOTE — Telephone Encounter (Signed)
Can try a med in a different class if he wishes.

## 2023-03-03 ENCOUNTER — Other Ambulatory Visit: Payer: Self-pay | Admitting: Family Medicine

## 2023-03-03 MED ORDER — FINASTERIDE 5 MG PO TABS: 5.0000 mg | ORAL_TABLET | Freq: Every day | ORAL | 1 refills | Status: DC

## 2023-03-09 ENCOUNTER — Inpatient Hospital Stay (HOSPITAL_BASED_OUTPATIENT_CLINIC_OR_DEPARTMENT_OTHER)
Admission: EM | Admit: 2023-03-09 | Discharge: 2023-03-13 | DRG: 149 | Disposition: A | Discharge status: Home Health | Payer: Medicare Other | Attending: Internal Medicine | Admitting: Internal Medicine

## 2023-03-09 ENCOUNTER — Observation Stay (HOSPITAL_COMMUNITY): Payer: Medicare Other

## 2023-03-09 ENCOUNTER — Other Ambulatory Visit: Payer: Self-pay

## 2023-03-09 ENCOUNTER — Encounter (HOSPITAL_BASED_OUTPATIENT_CLINIC_OR_DEPARTMENT_OTHER): Payer: Self-pay | Admitting: Emergency Medicine

## 2023-03-09 ENCOUNTER — Emergency Department (HOSPITAL_BASED_OUTPATIENT_CLINIC_OR_DEPARTMENT_OTHER): Payer: Medicare Other

## 2023-03-09 DIAGNOSIS — E785 Hyperlipidemia, unspecified: Secondary | ICD-10-CM | POA: Diagnosis present

## 2023-03-09 DIAGNOSIS — I493 Ventricular premature depolarization: Secondary | ICD-10-CM | POA: Diagnosis present

## 2023-03-09 DIAGNOSIS — M170 Bilateral primary osteoarthritis of knee: Secondary | ICD-10-CM | POA: Diagnosis present

## 2023-03-09 DIAGNOSIS — I10 Essential (primary) hypertension: Secondary | ICD-10-CM | POA: Diagnosis present

## 2023-03-09 DIAGNOSIS — H5500 Unspecified nystagmus: Secondary | ICD-10-CM | POA: Diagnosis not present

## 2023-03-09 DIAGNOSIS — Z66 Do not resuscitate: Secondary | ICD-10-CM | POA: Diagnosis present

## 2023-03-09 DIAGNOSIS — M792 Neuralgia and neuritis, unspecified: Secondary | ICD-10-CM | POA: Diagnosis present

## 2023-03-09 DIAGNOSIS — N400 Enlarged prostate without lower urinary tract symptoms: Secondary | ICD-10-CM | POA: Diagnosis not present

## 2023-03-09 DIAGNOSIS — E538 Deficiency of other specified B group vitamins: Secondary | ICD-10-CM | POA: Diagnosis present

## 2023-03-09 DIAGNOSIS — H6121 Impacted cerumen, right ear: Secondary | ICD-10-CM | POA: Diagnosis present

## 2023-03-09 DIAGNOSIS — N1832 Chronic kidney disease, stage 3b: Secondary | ICD-10-CM | POA: Diagnosis not present

## 2023-03-09 DIAGNOSIS — H8110 Benign paroxysmal vertigo, unspecified ear: Secondary | ICD-10-CM | POA: Diagnosis not present

## 2023-03-09 DIAGNOSIS — I129 Hypertensive chronic kidney disease with stage 1 through stage 4 chronic kidney disease, or unspecified chronic kidney disease: Secondary | ICD-10-CM | POA: Diagnosis present

## 2023-03-09 DIAGNOSIS — I4729 Other ventricular tachycardia: Secondary | ICD-10-CM | POA: Diagnosis not present

## 2023-03-09 DIAGNOSIS — I471 Supraventricular tachycardia, unspecified: Secondary | ICD-10-CM | POA: Diagnosis not present

## 2023-03-09 DIAGNOSIS — Z9682 Presence of neurostimulator: Secondary | ICD-10-CM | POA: Diagnosis not present

## 2023-03-09 DIAGNOSIS — Q613 Polycystic kidney, unspecified: Secondary | ICD-10-CM | POA: Diagnosis not present

## 2023-03-09 DIAGNOSIS — K219 Gastro-esophageal reflux disease without esophagitis: Secondary | ICD-10-CM | POA: Diagnosis not present

## 2023-03-09 DIAGNOSIS — N401 Enlarged prostate with lower urinary tract symptoms: Secondary | ICD-10-CM | POA: Diagnosis not present

## 2023-03-09 DIAGNOSIS — D472 Monoclonal gammopathy: Secondary | ICD-10-CM | POA: Diagnosis not present

## 2023-03-09 DIAGNOSIS — G459 Transient cerebral ischemic attack, unspecified: Secondary | ICD-10-CM | POA: Diagnosis present

## 2023-03-09 DIAGNOSIS — I6523 Occlusion and stenosis of bilateral carotid arteries: Secondary | ICD-10-CM | POA: Diagnosis not present

## 2023-03-09 DIAGNOSIS — G629 Polyneuropathy, unspecified: Secondary | ICD-10-CM | POA: Diagnosis not present

## 2023-03-09 DIAGNOSIS — N138 Other obstructive and reflux uropathy: Secondary | ICD-10-CM | POA: Diagnosis not present

## 2023-03-09 DIAGNOSIS — I951 Orthostatic hypotension: Secondary | ICD-10-CM | POA: Diagnosis not present

## 2023-03-09 DIAGNOSIS — Z8249 Family history of ischemic heart disease and other diseases of the circulatory system: Secondary | ICD-10-CM | POA: Diagnosis not present

## 2023-03-09 DIAGNOSIS — Z888 Allergy status to other drugs, medicaments and biological substances status: Secondary | ICD-10-CM

## 2023-03-09 DIAGNOSIS — Z79899 Other long term (current) drug therapy: Secondary | ICD-10-CM

## 2023-03-09 DIAGNOSIS — I69392 Facial weakness following cerebral infarction: Secondary | ICD-10-CM | POA: Diagnosis not present

## 2023-03-09 DIAGNOSIS — I472 Ventricular tachycardia, unspecified: Secondary | ICD-10-CM | POA: Diagnosis present

## 2023-03-09 DIAGNOSIS — Z7982 Long term (current) use of aspirin: Secondary | ICD-10-CM

## 2023-03-09 DIAGNOSIS — R270 Ataxia, unspecified: Secondary | ICD-10-CM | POA: Diagnosis present

## 2023-03-09 DIAGNOSIS — R61 Generalized hyperhidrosis: Secondary | ICD-10-CM | POA: Diagnosis not present

## 2023-03-09 DIAGNOSIS — R42 Dizziness and giddiness: Secondary | ICD-10-CM | POA: Diagnosis not present

## 2023-03-09 DIAGNOSIS — Z885 Allergy status to narcotic agent status: Secondary | ICD-10-CM

## 2023-03-09 DIAGNOSIS — I639 Cerebral infarction, unspecified: Secondary | ICD-10-CM | POA: Diagnosis not present

## 2023-03-09 LAB — BASIC METABOLIC PANEL
Anion gap: 8 (ref 5–15)
BUN: 23 mg/dL (ref 8–23)
CO2: 26 mmol/L (ref 22–32)
Calcium: 8.8 mg/dL — ABNORMAL LOW (ref 8.9–10.3)
Chloride: 104 mmol/L (ref 98–111)
Creatinine, Ser: 1.59 mg/dL — ABNORMAL HIGH (ref 0.61–1.24)
GFR, Estimated: 43 mL/min — ABNORMAL LOW (ref >60)
Glucose, Bld: 102 mg/dL — ABNORMAL HIGH (ref 70–99)
Potassium: 4.5 mmol/L (ref 3.5–5.1)
Sodium: 138 mmol/L (ref 135–145)

## 2023-03-09 LAB — CBC WITH DIFFERENTIAL/PLATELET
Abs Immature Granulocytes: 0.02 10*3/uL (ref 0.00–0.07)
Basophils Absolute: 0 10*3/uL (ref 0.0–0.1)
Basophils Relative: 1 %
Eosinophils Absolute: 0.1 10*3/uL (ref 0.0–0.5)
Eosinophils Relative: 1 %
HCT: 43.8 % (ref 39.0–52.0)
Hemoglobin: 14.4 g/dL (ref 13.0–17.0)
Immature Granulocytes: 0 %
Lymphocytes Relative: 28 %
Lymphs Abs: 1.7 10*3/uL (ref 0.7–4.0)
MCH: 31.8 pg (ref 26.0–34.0)
MCHC: 32.9 g/dL (ref 30.0–36.0)
MCV: 96.7 fL (ref 80.0–100.0)
Monocytes Absolute: 0.5 10*3/uL (ref 0.1–1.0)
Monocytes Relative: 8 %
Neutro Abs: 3.8 10*3/uL (ref 1.7–7.7)
Neutrophils Relative %: 62 %
Platelets: 170 10*3/uL (ref 150–400)
RBC: 4.53 MIL/uL (ref 4.22–5.81)
RDW: 13.2 % (ref 11.5–15.5)
WBC: 6.1 10*3/uL (ref 4.0–10.5)
nRBC: 0 % (ref 0.0–0.2)

## 2023-03-09 LAB — TROPONIN I (HIGH SENSITIVITY)
Troponin I (High Sensitivity): 5 ng/L (ref <18)
Troponin I (High Sensitivity): 5 ng/L (ref <18)

## 2023-03-09 LAB — HEMOGLOBIN A1C
Hgb A1c MFr Bld: 5.9 % — ABNORMAL HIGH (ref 4.8–5.6)
Mean Plasma Glucose: 122.63 mg/dL

## 2023-03-09 MED ORDER — ACETAMINOPHEN 325 MG PO TABS: 650.0000 mg | ORAL_TABLET | Freq: Four times a day (QID) | ORAL | Status: DC | PRN

## 2023-03-09 MED ORDER — HYDRALAZINE HCL 20 MG/ML IJ SOLN: 5.0000 mg | Freq: Four times a day (QID) | INTRAMUSCULAR | Status: DC | PRN

## 2023-03-09 MED ORDER — PANTOPRAZOLE SODIUM 40 MG PO TBEC: 40.0000 mg | DELAYED_RELEASE_TABLET | Freq: Every day | ORAL | Status: DC | PRN

## 2023-03-09 MED ORDER — ONDANSETRON 4 MG PO TBDP
4.0000 mg | ORAL_TABLET | Freq: Once | ORAL | Status: AC
  Administered 2023-03-09: 4 mg via ORAL
  Filled 2023-03-09: qty 1

## 2023-03-09 MED ORDER — FINASTERIDE 5 MG PO TABS
5.0000 mg | ORAL_TABLET | Freq: Every day | ORAL | Status: DC
  Administered 2023-03-11 – 2023-03-13 (×3): 5 mg via ORAL
  Filled 2023-03-09 (×4): qty 1

## 2023-03-09 MED ORDER — ENOXAPARIN SODIUM 40 MG/0.4ML IJ SOSY
40.0000 mg | PREFILLED_SYRINGE | INTRAMUSCULAR | Status: DC
  Administered 2023-03-09 – 2023-03-12 (×4): 40 mg via SUBCUTANEOUS
  Filled 2023-03-09 (×4): qty 0.4

## 2023-03-09 MED ORDER — TRAMADOL HCL 50 MG PO TABS
50.0000 mg | ORAL_TABLET | Freq: Two times a day (BID) | ORAL | Status: DC | PRN
  Administered 2023-03-10: 50 mg via ORAL
  Filled 2023-03-09: qty 1

## 2023-03-09 MED ORDER — SODIUM CHLORIDE 0.9 % IV SOLN: INTRAVENOUS | Status: DC

## 2023-03-09 MED ORDER — ASPIRIN 81 MG PO TBEC
81.0000 mg | DELAYED_RELEASE_TABLET | Freq: Every day | ORAL | Status: DC
  Administered 2023-03-09 – 2023-03-13 (×5): 81 mg via ORAL
  Filled 2023-03-09 (×5): qty 1

## 2023-03-09 MED ORDER — FLECAINIDE ACETATE 50 MG PO TABS
50.0000 mg | ORAL_TABLET | Freq: Two times a day (BID) | ORAL | Status: DC
  Administered 2023-03-09 – 2023-03-13 (×8): 50 mg via ORAL
  Filled 2023-03-09 (×9): qty 1

## 2023-03-09 MED ORDER — ATORVASTATIN CALCIUM 10 MG PO TABS
10.0000 mg | ORAL_TABLET | Freq: Every day | ORAL | Status: DC
  Administered 2023-03-09: 10 mg via ORAL
  Filled 2023-03-09: qty 1

## 2023-03-09 MED ORDER — DOCUSATE SODIUM 50 MG/5ML PO LIQD
10.0000 mg | Freq: Once | ORAL | Status: AC
  Administered 2023-03-09: 10 mg via OTIC
  Filled 2023-03-09: qty 10

## 2023-03-09 MED ORDER — IOHEXOL 350 MG/ML SOLN
60.0000 mL | Freq: Once | INTRAVENOUS | Status: AC | PRN
  Administered 2023-03-09: 60 mL via INTRAVENOUS

## 2023-03-09 MED ORDER — STROKE: EARLY STAGES OF RECOVERY BOOK
Freq: Once | Status: AC
  Filled 2023-03-09: qty 1

## 2023-03-09 MED ORDER — ATORVASTATIN CALCIUM 40 MG PO TABS
40.0000 mg | ORAL_TABLET | Freq: Every day | ORAL | Status: DC
  Administered 2023-03-10 – 2023-03-13 (×4): 40 mg via ORAL
  Filled 2023-03-09 (×4): qty 1

## 2023-03-09 MED ORDER — SENNOSIDES-DOCUSATE SODIUM 8.6-50 MG PO TABS
1.0000 | ORAL_TABLET | Freq: Every evening | ORAL | Status: DC | PRN
  Administered 2023-03-11: 1 via ORAL
  Filled 2023-03-09: qty 1

## 2023-03-09 MED ORDER — MECLIZINE HCL 25 MG PO TABS
25.0000 mg | ORAL_TABLET | Freq: Once | ORAL | Status: AC
  Administered 2023-03-09: 25 mg via ORAL
  Filled 2023-03-09: qty 1

## 2023-03-09 NOTE — ED Notes (Signed)
ED TO INPATIENT HANDOFF REPORT  ED Nurse Name and Phone #: Vonna Kotyk, RN  S Name/Age/Gender Elbert Ewings 87 y.o. male Room/Bed: MH08/MH08  Code Status   Code Status: Not on file  Home/SNF/Other Home Patient oriented to: self, place, time, and situation Is this baseline? Yes   Triage Complete: Triage complete  Chief Complaint TIA (transient ischemic attack) [G45.9]  Triage Note Pt reports he feels dizzy (both lightheaded and room spinning sensation) today and R ear feels full for the past 3 days. No shortness of breath or chest pain.    Allergies Allergies  Allergen Reactions   Amitriptyline Other (See Comments)    SLEPT FOR 18-19 HOURS STRAIGHT   Codeine Phosphate Other (See Comments)    Seen odd things crawling up wall   Pregabalin Other (See Comments)    Drowsy, nightmares, stumbling   Lexapro [Escitalopram Oxalate] Other (See Comments)    shaking    Level of Care/Admitting Diagnosis ED Disposition     ED Disposition  Admit   Condition  --   Comment  Hospital Area: [facility] [[id number]]  Level of Care: Telemetry Medical [104]  Interfacility transfer: Yes  May place patient in observation at [facility] or [facility] if equivalent level of care is available:: No  Covid Evaluation: Asymptomatic - no recent exposure (last 10 days) testing not required  Diagnosis: TIA (transient ischemic attack) [161096]  Admitting Physician: Emeline General [[id number]]  Attending Physician: Emeline General [[id number]]          B Medical/Surgery History Past Medical History:  Diagnosis Date   Abdominal aortic atherosclerosis (HCC) 04/15/2018   ANEMIA-IRON DEFICIENCY 06/23/2007   Ataxia 02/14/2020   B12 deficiency 06/13/2020   Balance problem 01/10/2020   BENIGN PROSTATIC HYPERTROPHY 06/23/2007   Bilateral leg pain 02/14/2020   BPH (benign prostatic hyperplasia) 06/23/2007   Qualifier: Diagnosis of  By: Marcelyn Ditty, RN, Katy Fitch   Formatting of this note might  be different from the original. Formatting of this note might be different from the original. Qualifier: Diagnosis of  By: Marcelyn Ditty, RN, Katy Fitch   Chronic kidney disease 10/18/2016   Degeneration of lumbar intervertebral disc 06/30/2018   Diverticulosis    Elevated diaphragm 04/15/2018   Essential hypertension 04/15/2018   Gastro-esophageal reflux disease without esophagitis 06/23/2007   Qualifier: Diagnosis of  By: Marcelyn Ditty, RN, Katy Fitch   Formatting of this note might be different from the original. Formatting of this note might be different from the original. Qualifier: Diagnosis of  By: Marcelyn Ditty, RN, Katy Fitch   Hypertension    Low back pain 03/09/2019   Lumbar radiculopathy 05/06/2019   Lumbosacral spondylosis without myelopathy 03/10/2019   MGUS (monoclonal gammopathy of unknown significance)    Monoclonal gammopathy of unknown significance (MGUS) 08/24/2018   Multiple renal cysts 04/06/2018   Night sweats 03/13/2018   Formatting of this note might be different from the original. Last Assessment & Plan:  Formatting of this note might be different from the original. It appears as if these have resolved now but were part of the constellation of presenting symptoms. Could reflect infectious, metabolic/endocrine, autoimmune process contributing.   Nonsustained ventricular tachycardia (HCC) 11/03/2019   Polycystic kidney disease    stage 3 per wife   Primary osteoarthritis of both knees 06/30/2018   Bilateral moderate with moderate chondromalacia patella Formatting of this note might be different from the original. Formatting of this note might be different from the original. Bilateral  moderate with moderate chondromalacia patella   Stage 3b chronic kidney disease (HCC) 06/21/2020   Weakness of both lower extremities 01/10/2020   Past Surgical History:  Procedure Laterality Date   HAND SURGERY     LUNG SURGERY     for collapsed lung   SPINAL CORD STIMULATOR IMPLANT  01/24/2023     A IV  Location/Drains/Wounds Patient Lines/Drains/Airways Status     Active Line/Drains/Airways     Name Placement date Placement time Site Days   Peripheral IV 03/09/23 20 G Right Antecubital 03/09/23  1047  Antecubital  less than 1   Peripheral IV 03/09/23 20 G Left Antecubital 03/09/23  1205  Antecubital  less than 1            Intake/Output Last 24 hours No intake or output data in the 24 hours ending 03/09/23 1536  Labs/Imaging Results for orders placed or performed during the hospital encounter of 03/09/23 (from the past 48 hour(s))  CBC with Differential     Status: None   Collection Time: 03/09/23 10:49 AM  Result Value Ref Range   WBC 6.1 4.0 - 10.5 K/uL   RBC 4.53 4.22 - 5.81 MIL/uL   Hemoglobin 14.4 13.0 - 17.0 g/dL   HCT 16.1 09.6 - 04.5 %   MCV 96.7 80.0 - 100.0 fL   MCH 31.8 26.0 - 34.0 pg   MCHC 32.9 30.0 - 36.0 g/dL   RDW 40.9 81.1 - 91.4 %   Platelets 170 150 - 400 K/uL   nRBC 0.0 0.0 - 0.2 %   Neutrophils Relative % 62 %   Neutro Abs 3.8 1.7 - 7.7 K/uL   Lymphocytes Relative 28 %   Lymphs Abs 1.7 0.7 - 4.0 K/uL   Monocytes Relative 8 %   Monocytes Absolute 0.5 0.1 - 1.0 K/uL   Eosinophils Relative 1 %   Eosinophils Absolute 0.1 0.0 - 0.5 K/uL   Basophils Relative 1 %   Basophils Absolute 0.0 0.0 - 0.1 K/uL   Immature Granulocytes 0 %   Abs Immature Granulocytes 0.02 0.00 - 0.07 K/uL    Comment: Performed at [facility], [address] [place], [place] [address]  Basic metabolic panel     Status: Abnormal   Collection Time: 03/09/23 10:49 AM  Result Value Ref Range   Sodium 138 135 - 145 mmol/L   Potassium 4.5 3.5 - 5.1 mmol/L   Chloride 104 98 - 111 mmol/L   CO2 26 22 - 32 mmol/L   Glucose, Bld 102 (H) 70 - 99 mg/dL    Comment: Glucose reference range applies only to samples taken after fasting for at least 8 hours.   BUN 23 8 - 23 mg/dL   Creatinine, Ser 6.21 (H) 0.61 - 1.24 mg/dL   Calcium 8.8 (L) 8.9 - 10.3 mg/dL   GFR, Estimated  43 (L) >60 mL/min    Comment: (NOTE) Calculated using the CKD-EPI Creatinine Equation (2021)    Anion gap 8 5 - 15    Comment: Performed at [facility], [address] [place], [place] [address]  Troponin I (High Sensitivity)     Status: None   Collection Time: 03/09/23 10:49 AM  Result Value Ref Range   Troponin I (High Sensitivity) 5 <18 ng/L    Comment: (NOTE) Elevated high sensitivity troponin I (hsTnI) values and significant  changes across serial measurements may suggest ACS but many other  chronic and acute conditions are known  to elevate hsTnI results.  Refer to the "Links" section for chest pain algorithms and additional  guidance. Performed at [facility], [address] [place], [place] [address]   Troponin I (High Sensitivity)     Status: None   Collection Time: 03/09/23 12:31 PM  Result Value Ref Range   Troponin I (High Sensitivity) 5 <18 ng/L    Comment: (NOTE) Elevated high sensitivity troponin I (hsTnI) values and significant  changes across serial measurements may suggest ACS but many other  chronic and acute conditions are known to elevate hsTnI results.  Refer to the "Links" section for chest pain algorithms and additional  guidance. Performed at [facility], [address] [place], [place] [address]    CT [facility] HEAD NECK W WO CM  Result Date: 03/09/2023 CLINICAL DATA:  Neuro deficit, acute, stroke suspected. Dizziness. Vertigo. EXAM: CT ANGIOGRAPHY HEAD AND NECK WITH AND WITHOUT CONTRAST TECHNIQUE: Multidetector CT imaging of the head and neck was performed using the standard protocol during bolus administration of intravenous contrast. Multiplanar CT image reconstructions and MIPs were obtained to evaluate the vascular anatomy. Carotid stenosis measurements (when applicable) are obtained utilizing NASCET criteria, using the distal internal carotid diameter as the denominator. RADIATION DOSE REDUCTION: This exam was  performed according to the departmental dose-optimization program which includes automated exposure control, adjustment of the mA and/or kV according to patient size and/or use of iterative reconstruction technique. CONTRAST:  60mL OMNIPAQUE IOHEXOL 350 MG/ML SOLN COMPARISON:  MRI 08/20/2021 FINDINGS: CT HEAD FINDINGS Brain: No focal abnormality seen affecting the brainstem or cerebellum. 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 Mild chronic small-vessel ischemic change of the hemispheric white matter, less than often seen at this age. No large vessel territory stroke. No mass, hemorrhage, hydrocephalus or extra-axial collection. Vascular: There is atherosclerotic calcification of the major vessels at the base of the brain. Skull: Negative Sinuses/Orbits: Clear/normal Other: No fluid in the middle ears or mastoid air cells. Review of the MIP images confirms the above findings CTA NECK FINDINGS Aortic arch: Aortic atherosclerosis.  No aneurysm. Right carotid system: Common carotid artery widely patent to the bifurcation. Mild calcified plaque at the carotid bifurcation and ICA bulb but no stenosis. Cervical ICA normal beyond that. Left carotid system: Common carotid artery widely patent to the bifurcation. Minimal calcified plaque at the bifurcation but no stenosis. Cervical ICA normal beyond that. Vertebral arteries: Left vertebral artery is dominant. Plaque adjacent to both vertebral artery origins but no stenosis. Both vertebral arteries are patent through the cervical region to the foramen magnum. Skeleton: Ordinary chronic cervical spondylosis. Other neck: No mass or lymphadenopathy. Upper chest: No active disease at the lung apices. Review of the MIP images confirms the above findings CTA HEAD FINDINGS Anterior circulation: Both internal carotid arteries are patent through the skull base and siphon regions. No siphon stenosis. The anterior and middle cerebral vessels are patent. No large vessel  occlusion or proximal stenosis. No aneurysm or vascular malformation. Posterior circulation: Both vertebral arteries are patent through the foramen magnum. There is some atherosclerotic calcification of the vertebral artery V4 segments but no stenosis. Basilar artery is tortuous but not stenotic. Superior cerebellar and posterior cerebral arteries show flow. Large patent posterior communicating artery on the right. Venous sinuses: Patent and normal. Anatomic variants: None significant. Review of the MIP images confirms the above findings IMPRESSION: 1. No acute head CT finding. 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 Mild  chronic small-vessel ischemic change of the white matter, less than often seen at this age. 2. No intracranial large vessel occlusion or proximal stenosis. 3. Aortic atherosclerosis. Mild atherosclerotic change at both carotid bifurcations but no stenosis. 4. Mild atherosclerotic change of the vertebral arteries but no flow limiting stenosis. 5. Today's study does not show an explanation for the clinical presentation. Aortic Atherosclerosis (ICD10-I70.0). Electronically Signed   By: Paulina Fusi M.D.   On: 03/09/2023 12:26    Pending Labs Unresulted Labs (From admission, onward)    None       Vitals/Pain Today's Vitals   03/09/23 1235 03/09/23 1300 03/09/23 1400 03/09/23 1530  BP:  (!) 179/82  (!) 143/72  Pulse:  71  64  Resp:  17  16  Temp:   98.3 F (36.8 C)   TempSrc:   Oral   SpO2:  94%  96%  Weight:      Height:      PainSc: 0-No pain       Isolation Precautions No active isolations  Medications Medications  acetaminophen (TYLENOL) tablet 650 mg (has no administration in time range)  aspirin EC tablet 81 mg (81 mg Oral Given 03/09/23 1426)  traMADol (ULTRAM) tablet 50 mg (has no administration in time range)  atorvastatin (LIPITOR) tablet 10 mg (10 mg Oral Given 03/09/23 1425)  flecainide (TAMBOCOR) tablet 50 mg (has no administration in time range)   pantoprazole (PROTONIX) EC tablet 40 mg (has no administration in time range)  finasteride (PROSCAR) tablet 5 mg (5 mg Oral Not Given 03/09/23 1421)  hydrALAZINE (APRESOLINE) injection 5 mg (has no administration in time range)  meclizine (ANTIVERT) tablet 25 mg (25 mg Oral Given 03/09/23 1103)  ondansetron (ZOFRAN-ODT) disintegrating tablet 4 mg (4 mg Oral Given 03/09/23 1104)  iohexol (OMNIPAQUE) 350 MG/ML injection 60 mL (60 mLs Intravenous Contrast Given 03/09/23 1153)  docusate (COLACE) 50 MG/5ML liquid 10 mg (10 mg Right EAR Given 03/09/23 1222)    Mobility walks with person assist     Focused Assessments Neuro Assessment Handoff:  Swallow screen pass? Yes    NIH Stroke Scale  Dizziness Present: Yes Headache Present: No Interval: Initial Level of Consciousness (1a.)   : Alert, keenly responsive LOC Questions (1b. )   : Answers both questions correctly LOC Commands (1c. )   : Performs both tasks correctly Best Gaze (2. )  : Normal Visual (3. )  : No visual loss Facial Palsy (4. )    : Normal symmetrical movements Motor Arm, Left (5a. )   : No drift Motor Arm, Right (5b. ) : No drift Motor Leg, Left (6a. )  : No drift Motor Leg, Right (6b. ) : No drift Limb Ataxia (7. ): Absent Sensory (8. )  : Normal, no sensory loss Best Language (9. )  : No aphasia Dysarthria (10. ): Normal Extinction/Inattention (11.)   : No Abnormality Complete NIHSS TOTAL: 0     Neuro Assessment:   Neuro Checks:   Initial (03/09/23 1300)  Has TPA been given? No If patient is a Neuro Trauma and patient is going to OR before floor call report to 4N Charge nurse: [phone] or [phone]   R Recommendations: See Admitting Provider Note  Report given to:   Additional Notes:

## 2023-03-09 NOTE — ED Notes (Signed)
Orthostatic vitals completed per order. Pt reports extreme dizziness when laying down and moving to a sitting up position. Was unable at first to stand up had to take several minutes to attempt standing vitals and had to assist holding up. Once standing dizziness remained. Pt assisted back onto bed on monitor.

## 2023-03-09 NOTE — ED Notes (Signed)
ED Provider at bedside. 

## 2023-03-09 NOTE — ED Provider Notes (Signed)
[facility] [facility] Provider Note   CSN: [id number] Arrival date & time: 03/09/23  1013     History  Chief Complaint  Patient presents with   Near Syncope   Ear Fullness    Alex Dunn is a 87 y.o. male.  Patient with a history of BPH, hypertension, kidney disease, MGUS, lumbar radiculopathy with spinal stimulator in place 6 weeks ago, GERD presenting with ear fullness for the past 3 days.  Woke up with dizziness this morning described as room spinning worse with position change and worse with lying down.  Some difficulty walking due to room spinning dizziness.  Symptoms have been constant since he woke up this morning.  No focal weakness, numbness or tingling.  No difficulty speaking or difficulty swallowing.  No chest pain or shortness of breath.  No blood thinner use other than aspirin.  Has never had this dizziness before.  No visual changes.  No headache.  States his right ear still feels full and has decreased hearing.  Feels the room spinning is better when he is sitting up and worse with lying down.  The history is provided by the patient.  Near Syncope Pertinent negatives include no abdominal pain, no headaches and no shortness of breath.  Ear Fullness Pertinent negatives include no abdominal pain, no headaches and no shortness of breath.       Home Medications Prior to Admission medications   Medication Sig Start Date End Date Taking? Authorizing Provider  acetaminophen (TYLENOL) 325 MG tablet Take 2 tablets by mouth 4 (four) times daily as needed for mild pain or moderate pain (pain).    [provider]  aspirin EC 81 MG tablet Take 81 mg by mouth daily. Swallow whole.    [provider]  atorvastatin (LIPITOR) 10 MG tablet Take 1 tablet (10 mg total) by mouth daily. 06/05/22   Sharlene Dory, DO  Cholecalciferol (VITAMIN D) 50 MCG (2000 UT) CAPS Take 2,000 Units by mouth daily at 6 (six) AM.    [provider]  cyanocobalamin 1000 MCG tablet Take 1,000 mcg by mouth daily.    [provider]  finasteride (PROSCAR) 5 MG tablet Take 1 tablet (5 mg total) by mouth daily. 03/03/23   Sharlene Dory, DO  flecainide (TAMBOCOR) 50 MG tablet TAKE 1 TABLET(50 MG) BY MOUTH TWICE DAILY Patient taking differently: Take 50 mg by mouth 2 (two) times daily. TAKE 1 TABLET(50 MG) BY MOUTH TWICE DAILY 06/12/22   Marinus Maw, MD  losartan (COZAAR) 50 MG tablet TAKE 1 TABLET(50 MG) BY MOUTH DAILY Patient taking differently: Take 50 mg by mouth daily. 07/18/22   Marinus Maw, MD  Multiple Vitamins-Minerals (PRESERVISION AREDS) CAPS Take 1 capsule by mouth in the morning and at bedtime. 06/21/20   [provider]  pantoprazole (PROTONIX) 40 MG tablet Take 1 tablet (40 mg total) by mouth daily as needed. Patient taking differently: Take 40 mg by mouth daily as needed Genella Rife). 08/27/22   Sharlene Dory, DO  traMADol (ULTRAM) 50 MG tablet Take 1 tablet (50 mg total) by mouth every 12 (twelve) hours as needed for moderate pain or severe pain. TAKE 1 TABLET(50 MG) BY MOUTH EVERY 12 HOURS AS NEEDED FOR MODERATE PAIN 12/31/22   Sharlene Dory, DO  VITAMIN D PO Take 2,000 Int'l Units by mouth daily.    [provider]      Allergies    Amitriptyline, Codeine  phosphate, Pregabalin, and Lexapro [escitalopram oxalate]    Review of Systems   Review of Systems  Constitutional:  Negative for activity change, appetite change, fatigue and fever.  HENT:  Positive for hearing loss. Negative for congestion.   Respiratory:  Negative for cough, chest tightness and shortness of breath.   Cardiovascular:  Positive for near-syncope.  Gastrointestinal:  Negative for abdominal pain, nausea and vomiting.  Genitourinary:  Negative for dysuria and hematuria.  Musculoskeletal:  Negative for arthralgias and myalgias.  Skin:  Negative for rash.  Neurological:  Positive for  dizziness. Negative for weakness and headaches.    all other systems are negative except as noted in the HPI and PMH.   Physical Exam Updated Vital Signs BP (!) 169/86   Pulse 68   Temp 98 F (36.7 C) (Oral)   Resp 16   Ht 6' (1.829 m)   Wt 87.5 kg   SpO2 99%   BMI 26.18 kg/m  Physical Exam Vitals and nursing note reviewed.  Constitutional:      General: He is not in acute distress.    Appearance: Normal appearance. He is well-developed and normal weight. He is not ill-appearing.  HENT:     Head: Normocephalic and atraumatic.     Right Ear: There is impacted cerumen.     Mouth/Throat:     Pharynx: No oropharyngeal exudate.  Eyes:     Conjunctiva/sclera: Conjunctivae normal.     Pupils: Pupils are equal, round, and reactive to light.  Neck:     Comments: No meningismus. Cardiovascular:     Rate and Rhythm: Normal rate and regular rhythm.     Heart sounds: Normal heart sounds. No murmur heard. Pulmonary:     Effort: Pulmonary effort is normal. No respiratory distress.     Breath sounds: Normal breath sounds.  Abdominal:     Palpations: Abdomen is soft.     Tenderness: There is no abdominal tenderness. There is no guarding or rebound.  Musculoskeletal:        General: No tenderness. Normal range of motion.     Cervical back: Normal range of motion and neck supple.  Skin:    General: Skin is warm.  Neurological:     Mental Status: He is alert and oriented to person, place, and time.     Cranial Nerves: No cranial nerve deficit.     Motor: No abnormal muscle tone.     Coordination: Coordination normal.     Comments: CN 2-12 intact, no ataxia on finger to nose, no nystagmus, 5/5 strength throughout, no pronator drift,   Right-sided horizontal nystagmus that is fatigable. No ataxia finger-to-nose, no pronator drift  Psychiatric:        Behavior: Behavior normal.     ED Results / Procedures / Treatments   Labs (all labs ordered are listed, but only abnormal  results are displayed) Labs Reviewed  BASIC METABOLIC PANEL - Abnormal; Notable for the following components:      Result Value   Glucose, Bld 102 (*)    Creatinine, Ser 1.59 (*)    Calcium 8.8 (*)    GFR, Estimated 43 (*)    All other components within normal limits  CBC WITH DIFFERENTIAL/PLATELET  TROPONIN I (HIGH SENSITIVITY)  TROPONIN I (HIGH SENSITIVITY)    EKG EKG Interpretation  Date/Time:  Sunday Mar 09 2023 10:30:23 EDT Ventricular Rate:  64 PR Interval:  228 QRS Duration: 88 QT Interval:  403 QTC Calculation: 416 R Axis:  42 Text Interpretation: Sinus rhythm Prolonged PR interval Low voltage, precordial leads No significant change was found Confirmed by Glynn Octave [phone]) on 03/09/2023 10:39:40 AM  Radiology CT ANGIO HEAD NECK W WO CM  Result Date: 03/09/2023 CLINICAL DATA:  Neuro deficit, acute, stroke suspected. Dizziness. Vertigo. EXAM: CT ANGIOGRAPHY HEAD AND NECK WITH AND WITHOUT CONTRAST TECHNIQUE: Multidetector CT imaging of the head and neck was performed using the standard protocol during bolus administration of intravenous contrast. Multiplanar CT image reconstructions and MIPs were obtained to evaluate the vascular anatomy. Carotid stenosis measurements (when applicable) are obtained utilizing NASCET criteria, using the distal internal carotid diameter as the denominator. RADIATION DOSE REDUCTION: This exam was performed according to the departmental dose-optimization program which includes automated exposure control, adjustment of the mA and/or kV according to patient size and/or use of iterative reconstruction technique. CONTRAST:  60mL OMNIPAQUE IOHEXOL 350 MG/ML SOLN COMPARISON:  MRI 08/20/2021 FINDINGS: CT HEAD FINDINGS Brain: No focal abnormality seen affecting the brainstem or cerebellum. 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 Mild chronic small-vessel ischemic change of the hemispheric white matter, less than often seen at this age. No large  vessel territory stroke. No mass, hemorrhage, hydrocephalus or extra-axial collection. Vascular: There is atherosclerotic calcification of the major vessels at the base of the brain. Skull: Negative Sinuses/Orbits: Clear/normal Other: No fluid in the middle ears or mastoid air cells. Review of the MIP images confirms the above findings CTA NECK FINDINGS Aortic arch: Aortic atherosclerosis.  No aneurysm. Right carotid system: Common carotid artery widely patent to the bifurcation. Mild calcified plaque at the carotid bifurcation and ICA bulb but no stenosis. Cervical ICA normal beyond that. Left carotid system: Common carotid artery widely patent to the bifurcation. Minimal calcified plaque at the bifurcation but no stenosis. Cervical ICA normal beyond that. Vertebral arteries: Left vertebral artery is dominant. Plaque adjacent to both vertebral artery origins but no stenosis. Both vertebral arteries are patent through the cervical region to the foramen magnum. Skeleton: Ordinary chronic cervical spondylosis. Other neck: No mass or lymphadenopathy. Upper chest: No active disease at the lung apices. Review of the MIP images confirms the above findings CTA HEAD FINDINGS Anterior circulation: Both internal carotid arteries are patent through the skull base and siphon regions. No siphon stenosis. The anterior and middle cerebral vessels are patent. No large vessel occlusion or proximal stenosis. No aneurysm or vascular malformation. Posterior circulation: Both vertebral arteries are patent through the foramen magnum. There is some atherosclerotic calcification of the vertebral artery V4 segments but no stenosis. Basilar artery is tortuous but not stenotic. Superior cerebellar and posterior cerebral arteries show flow. Large patent posterior communicating artery on the right. Venous sinuses: Patent and normal. Anatomic variants: None significant. Review of the MIP images confirms the above findings IMPRESSION: 1. No acute  head CT finding. 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 Mild chronic small-vessel ischemic change of the white matter, less than often seen at this age. 2. No intracranial large vessel occlusion or proximal stenosis. 3. Aortic atherosclerosis. Mild atherosclerotic change at both carotid bifurcations but no stenosis. 4. Mild atherosclerotic change of the vertebral arteries but no flow limiting stenosis. 5. Today's study does not show an explanation for the clinical presentation. Aortic Atherosclerosis (ICD10-I70.0). Electronically Signed   By: Paulina Fusi M.D.   On: 03/09/2023 12:26    Procedures Procedures    Medications Ordered in ED Medications  meclizine (ANTIVERT) tablet 25 mg (has no administration in time range)  ondansetron (ZOFRAN-ODT) disintegrating tablet 4 mg (has no administration in time range)    ED Course/ Medical Decision Making/ A&P                             Medical Decision Making Amount and/or Complexity of Data Reviewed Labs: ordered. Decision-making details documented in ED Course. Radiology: ordered and independent interpretation performed. Decision-making details documented in ED Course. ECG/medicine tests: ordered and independent interpretation performed. Decision-making details documented in ED Course.  Risk OTC drugs. Prescription drug management. Decision regarding hospitalization.  Room spinning dizziness since this morning.  Right ear fullness for the past 3 days.  No other neurological deficits.  No chest pain or shortness of breath. Felt poorly yesterday morning with some dizziness as well.   Does have cerumen impaction on the right which may be contributing to symptoms.  Does have right-sided fatigable nystagmus but no ataxia on finger-to-nose.  Orthostatics are negative.  Patient does report feeling dizzy when lying down and moving to a sitting up position.  Patient given IV fluids, meclizine, Zofran.  Concern for peripheral vertigo the  patient has had a stroke in the past.  He describes the dizziness worse with lying down and better with sitting up.  Worse with position change.  CTA is obtained and shows no large vessel occlusion.  No basilar artery stenosis or vertebral artery stenosis.  No large vessel infarct.  Does have old lacunar infarct.  On reassessment, patient states he still feels dizzy.  He has noted to have some nasolabial fold flattening on the right which his wife thinks is worse than baseline.  He did have a stroke in 2020. He reports he started feeling poorly yesterday morning so he is out of the window for any kind of potential stroke intervention.  Recently received a spinal stimulator, unknown if it is MRI compatible.  No paperwork available.  Discussed with Dr. Otelia Limes of neurology.  Does agree with MRI if able.  Will need to obtain paperwork on this.  Neurology will consult at [facility].  Patient with difficulty walking it appears to be significant fall risk.  He does not feel safe going home and agrees with admission.  Wife at bedside believes his right nasolabial fold flattening is slightly worse compared to his baseline.  Family to bring in paperwork on spinal stimulator to determine MRI compatibility.  Given ongoing dizziness, precluding ambulation, will plan admission for therapies and MRI. D/w Dr. Chipper Herb.       Final Clinical Impression(s) / ED Diagnoses Final diagnoses:  None    Rx / DC Orders ED Discharge Orders     None         Shaya Reddick, Jeannett Senior, MD 03/09/23 1659

## 2023-03-09 NOTE — ED Notes (Signed)
Patient transported to CT 

## 2023-03-09 NOTE — H&P (Signed)
History and Physical    Patient: Alex Dunn ION:[id number] DOB: 1936/05/18 DOA: 03/09/2023 DOS: the patient was seen and examined on 03/09/2023 PCP: Sharlene Dory, DO  Patient coming from: Home - lives with wife and step-daughter; [facility]:  Wife, [phone]   Chief Complaint: Vertigo  HPI: DENIEL BLAKEMAN is a 87 y.o. male with medical history significant of BPH, HTN, MGUS, stage 3b CKD, and NSVT on Flecainide presenting with vertigo.   He reports that he awoke about 0430 and was profoundly dizzy.  He could not sit up, the room was spinning.  He couldn't focus his eyes.  He laid down and moved around with some improvement.  It improved enough that he was able to get up.  He still felt dizzy and it is ongoing.  No prior history of similar.  About 5-6 years ago, he was having night sweats and flu-like symptoms x 4-5 hours and then would resolve.  It became more occasional and a doctor was not able to figure it out.  He had one of those episodes Friday night, felt flu-like x 5-6 hours.  He felt better during the day Saturday but was still fatigued.  His last prior episode was 4-5 months ago.  Recently, he has been having episodes with standing like he was going to black out.  This often happens after working outside and his BP would be really low, in the 20s.  He talked to his heart doctor and recommended against working in the yard.  Once the BP normalizes he feels better.  With his episode overnight, he didn't notice any other neurologic symptoms.  He got a spinal cord stimulator about 6 weeks ago.  The stimulator works well for his neuropathy and controls the pain.  His orthostatic episodes preceded the stimulator.    ER Course:  [facility] to [facility] transfer, per Dr. Chipper Herb:  Sudden onset of vertigo this morning with significant ataxia and mild right facial droop. CT head and CTA negative. Patient had a spinal stimulator inserted <6 wks ago ED at [place] can not determine whether MRI will be  safe. Neurology Dr. Georg Ruddle at [facility] aware.      Review of Systems: As mentioned in the history of present illness. All other systems reviewed and are negative. Past Medical History:  Diagnosis Date   Abdominal aortic atherosclerosis (HCC) 04/15/2018   ANEMIA-IRON DEFICIENCY 06/23/2007   B12 deficiency 06/13/2020   BPH (benign prostatic hyperplasia) 06/23/2007   Qualifier: Diagnosis of  By: Marcelyn Ditty, RN, Katy Fitch   Formatting of this note might be different from the original. Formatting of this note might be different from the original. Qualifier: Diagnosis of  By: Marcelyn Ditty, RN, Katy Fitch   Degeneration of lumbar intervertebral disc 06/30/2018   Diverticulosis    Elevated diaphragm 04/15/2018   Essential hypertension 04/15/2018   Gastro-esophageal reflux disease without esophagitis 06/23/2007   Qualifier: Diagnosis of  By: Marcelyn Ditty, RN, Katy Fitch   Formatting of this note might be different from the original. Formatting of this note might be different from the original. Qualifier: Diagnosis of  By: Marcelyn Ditty, RN, Katy Fitch   Lumbar radiculopathy 05/06/2019   Lumbosacral spondylosis without myelopathy 03/10/2019   Monoclonal gammopathy of unknown significance (MGUS) 08/24/2018   Multiple renal cysts 04/06/2018   Nonsustained ventricular tachycardia (HCC) 11/03/2019   Primary osteoarthritis of both knees 06/30/2018   Bilateral moderate with moderate chondromalacia patella Formatting of this note might be different from the original. Formatting of this  note might be different from the original. Bilateral moderate with moderate chondromalacia patella   Stage 3b chronic kidney disease (HCC) 06/21/2020   Past Surgical History:  Procedure Laterality Date   HAND SURGERY     LUNG SURGERY     for collapsed lung   SPINAL CORD STIMULATOR IMPLANT  01/24/2023   Social History:  reports that he has never smoked. He has never been exposed to tobacco smoke. He has never used smokeless tobacco. He reports that he  does not drink alcohol and does not use drugs.  Allergies  Allergen Reactions   Amitriptyline Other (See Comments)    SLEPT FOR 18-19 HOURS STRAIGHT   Codeine Phosphate Other (See Comments)    Seen odd things crawling up wall   Pregabalin Other (See Comments)    Drowsy, nightmares, stumbling   Lexapro [Escitalopram Oxalate] Other (See Comments)    shaking    Family History  Problem Relation Age of Onset   Heart attack Mother    Heart attack Father    Heart attack Brother        "leaky valve"   Neuromuscular disorder Neg Hx     Prior to Admission medications   Medication Sig Start Date End Date Taking? Authorizing Provider  acetaminophen (TYLENOL) 325 MG tablet Take 2 tablets by mouth 4 (four) times daily as needed for mild pain or moderate pain (pain).    [provider]  aspirin EC 81 MG tablet Take 81 mg by mouth daily. Swallow whole.    [provider]  atorvastatin (LIPITOR) 10 MG tablet Take 1 tablet (10 mg total) by mouth daily. 06/05/22   Sharlene Dory, DO  Cholecalciferol (VITAMIN D) 50 MCG (2000 UT) CAPS Take 2,000 Units by mouth daily at 6 (six) AM.    [provider]  cyanocobalamin 1000 MCG tablet Take 1,000 mcg by mouth daily.    [provider]  finasteride (PROSCAR) 5 MG tablet Take 1 tablet (5 mg total) by mouth daily. 03/03/23   Sharlene Dory, DO  flecainide (TAMBOCOR) 50 MG tablet TAKE 1 TABLET(50 MG) BY MOUTH TWICE DAILY Patient taking differently: Take 50 mg by mouth 2 (two) times daily. TAKE 1 TABLET(50 MG) BY MOUTH TWICE DAILY 06/12/22   Marinus Maw, MD  losartan (COZAAR) 50 MG tablet TAKE 1 TABLET(50 MG) BY MOUTH DAILY Patient taking differently: Take 50 mg by mouth daily. 07/18/22   Marinus Maw, MD  Multiple Vitamins-Minerals (PRESERVISION AREDS) CAPS Take 1 capsule by mouth in the morning and at bedtime. 06/21/20   [provider]  pantoprazole (PROTONIX) 40 MG tablet Take 1 tablet (40 mg  total) by mouth daily as needed. Patient taking differently: Take 40 mg by mouth daily as needed Genella Rife). 08/27/22   Sharlene Dory, DO  traMADol (ULTRAM) 50 MG tablet Take 1 tablet (50 mg total) by mouth every 12 (twelve) hours as needed for moderate pain or severe pain. TAKE 1 TABLET(50 MG) BY MOUTH EVERY 12 HOURS AS NEEDED FOR MODERATE PAIN 12/31/22   Sharlene Dory, DO  VITAMIN D PO Take 2,000 Int'l Units by mouth daily.    [provider]    Physical Exam: Vitals:   03/09/23 1400 03/09/23 1530 03/09/23 1545 03/09/23 1700  BP:  (!) 143/72 (!) 146/70 (!) 155/72  Pulse:  64 66 82  Resp:  16 11 14   Temp: 98.3 F (36.8 C)   98.2 F (36.8 C)  TempSrc: Oral  Oral  SpO2:  96% 95% 96%  Weight:    83.4 kg  Height:    6' (1.829 m)   General:  Appears calm and comfortable and is in NAD, very conversant Eyes:  EOMI, normal lids, iris ENT:  grossly normal hearing, lips & tongue, mmm; appropriate dentition Neck:  no LAD, masses or thyromegaly Cardiovascular:  RRR, no m/r/g. No LE edema.  Respiratory:   CTA bilaterally with no wheezes/rales/rhonchi.  Normal respiratory effort. Abdomen:  soft, NT, ND Skin:  no rash or induration seen on limited exam Musculoskeletal:  grossly normal tone BUE/BLE, good ROM, no bony abnormality Psychiatric:  grossly normal mood and affect, speech fluent and appropriate, AOx3 Neurologic:  R-sided facial droop, moves all extremities in coordinated fashion, sensation intact   Radiological Exams on Admission: Independently reviewed - see discussion in A/P where applicable  CT ANGIO HEAD NECK W WO CM  Result Date: 03/09/2023 CLINICAL DATA:  Neuro deficit, acute, stroke suspected. Dizziness. Vertigo. EXAM: CT ANGIOGRAPHY HEAD AND NECK WITH AND WITHOUT CONTRAST TECHNIQUE: Multidetector CT imaging of the head and neck was performed using the standard protocol during bolus administration of intravenous contrast. Multiplanar CT image  reconstructions and MIPs were obtained to evaluate the vascular anatomy. Carotid stenosis measurements (when applicable) are obtained utilizing NASCET criteria, using the distal internal carotid diameter as the denominator. RADIATION DOSE REDUCTION: This exam was performed according to the departmental dose-optimization program which includes automated exposure control, adjustment of the mA and/or kV according to patient size and/or use of iterative reconstruction technique. CONTRAST:  60mL OMNIPAQUE IOHEXOL 350 MG/ML SOLN COMPARISON:  MRI 08/20/2021 FINDINGS: CT HEAD FINDINGS Brain: No focal abnormality seen affecting the brainstem or cerebellum. 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 Mild chronic small-vessel ischemic change of the hemispheric white matter, less than often seen at this age. No large vessel territory stroke. No mass, hemorrhage, hydrocephalus or extra-axial collection. Vascular: There is atherosclerotic calcification of the major vessels at the base of the brain. Skull: Negative Sinuses/Orbits: Clear/normal Other: No fluid in the middle ears or mastoid air cells. Review of the MIP images confirms the above findings CTA NECK FINDINGS Aortic arch: Aortic atherosclerosis.  No aneurysm. Right carotid system: Common carotid artery widely patent to the bifurcation. Mild calcified plaque at the carotid bifurcation and ICA bulb but no stenosis. Cervical ICA normal beyond that. Left carotid system: Common carotid artery widely patent to the bifurcation. Minimal calcified plaque at the bifurcation but no stenosis. Cervical ICA normal beyond that. Vertebral arteries: Left vertebral artery is dominant. Plaque adjacent to both vertebral artery origins but no stenosis. Both vertebral arteries are patent through the cervical region to the foramen magnum. Skeleton: Ordinary chronic cervical spondylosis. Other neck: No mass or lymphadenopathy. Upper chest: No active disease at the lung apices. Review of  the MIP images confirms the above findings CTA HEAD FINDINGS Anterior circulation: Both internal carotid arteries are patent through the skull base and siphon regions. No siphon stenosis. The anterior and middle cerebral vessels are patent. No large vessel occlusion or proximal stenosis. No aneurysm or vascular malformation. Posterior circulation: Both vertebral arteries are patent through the foramen magnum. There is some atherosclerotic calcification of the vertebral artery V4 segments but no stenosis. Basilar artery is tortuous but not stenotic. Superior cerebellar and posterior cerebral arteries show flow. Large patent posterior communicating artery on the right. Venous sinuses: Patent and normal. Anatomic variants: None significant. Review of the MIP images confirms the above findings  IMPRESSION: 1. No acute head CT finding. 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 Mild chronic small-vessel ischemic change of the white matter, less than often seen at this age. 2. No intracranial large vessel occlusion or proximal stenosis. 3. Aortic atherosclerosis. Mild atherosclerotic change at both carotid bifurcations but no stenosis. 4. Mild atherosclerotic change of the vertebral arteries but no flow limiting stenosis. 5. Today's study does not show an explanation for the clinical presentation. Aortic Atherosclerosis (ICD10-I70.0). Electronically Signed   By: Paulina Fusi M.D.   On: 03/09/2023 12:26    EKG: Independently reviewed.  NSR with rate 64; low voltage with no evidence of acute ischemia   Labs on Admission: I have personally reviewed the available labs and imaging studies at the time of the admission.  Pertinent labs:    Glucose 102 BUN 23/Creatinine 1.59/GFR 43 - stable HS troponin 5, 5 Normal CBC   Assessment and Plan: Principal Problem:   Vertigo Active Problems:   BPH (benign prostatic hyperplasia)   Night sweats   Essential hypertension   Nonsustained ventricular tachycardia  (HCC)   Stage 3b chronic kidney disease (HCC)   Neuropathic pain   Dyslipidemia   Orthostatic hypotension   DNR (do not resuscitate)    Vertigo -Patient with episodes of flu-like illness for several years -Had one such episode Friday and was still recovering -Awoke about 0430 today with new-onset severe vertigo -Also appears to have a new facial droop on the R (compared to photos in his wife's phone) -This is somewhat concerning for CVA although vertigo is usually cerebellar and would not usually be associated with facial droop -Vertigo could also simply be BPPV -Will observe on telemetry -Aspirin has been given to reduce stroke mortality and decrease morbidity -MRI ordered but with spinal cord stimulator that is MR-conditional this will probably have to wait until tomorrow when cardiology can assist -Neurology consulted but may wait to see the patient pending PT vestibular eval and MRI result -PT/OT/ST/Nutrition Consults  HTN -Allow permissive HTN for now -Treat BP only if >220/120, and then with goal of 15% reduction -Hold ARB and plan to restart in 48-72 hours   HLD -Continue Lipitor but increase empirically to 40 mg daily   Orthostatic hypotensive episodes -He describes episodes of what sounds like orthostatic hypotension -This occurs mainly after working in the yard (likely hypovolemic) or when rising from a seated position -Will check orthostatics -He did not tolerate tamsulosin due to worsening orthostasis  Flu-like illness episodes with night sweats -He describes episodes lasting 5-6 hours that result in myalgias, chills, night sweats, and generalized misery -After the episodes, he continues to feel some lingering fatigue -He has been evaluated for these episodes in the past without clear answer -He had not had such an episode in many months but did have an episode Friday with persistent lethargy over the weekend leading up to today's vertigo  Peripheral  neuropathy -Recent (01/24/23) spinal cord stimulator placement at [facility] -He has had a good response with decreased neuropathic pain -Other episodes preceded stimulator placement and he and his wife think these are unrelated -Continue prn Tramadol  SVT -Continue flecainide -Currently in NSR   Stage 3b CKD -Appears to be stable at this time -Attempt to avoid nephrotoxic medications -Recheck BMP in AM   BPH -Continue finasteride  DNR -I have discussed code status with the patient and his wife and they are in agreement that the patient would not desire resuscitation and would prefer to die  a natural death should that situation arise.      Advance Care Planning:   Code Status: DNR   Consults: Neurology (possibly telephone only for now); PT (vestibular and otherwise), OT; nutrition; TOC team  DVT Prophylaxis: Lovenox  Family Communication: Wife was present throughout evaluation  Severity of Illness: The appropriate patient status for this patient is OBSERVATION. Observation status is judged to be reasonable and necessary in order to provide the required intensity of service to ensure the patient's safety. The patient's presenting symptoms, physical exam findings, and initial radiographic and laboratory data in the context of their medical condition is felt to place them at decreased risk for further clinical deterioration. Furthermore, it is anticipated that the patient will be medically stable for discharge from the hospital within 2 midnights of admission.   Author: Jonah Blue, MD 03/09/2023 6:40 PM  For on call review [email or web address].

## 2023-03-09 NOTE — ED Notes (Signed)
Report attempted 

## 2023-03-09 NOTE — ED Notes (Signed)
Called [facility] for transport at 3:35.  

## 2023-03-09 NOTE — ED Triage Notes (Signed)
Pt reports he feels dizzy (both lightheaded and room spinning sensation) today and R ear feels full for the past 3 days. No shortness of breath or chest pain.

## 2023-03-10 ENCOUNTER — Observation Stay (HOSPITAL_COMMUNITY): Payer: Medicare Other

## 2023-03-10 DIAGNOSIS — R42 Dizziness and giddiness: Secondary | ICD-10-CM | POA: Diagnosis not present

## 2023-03-10 DIAGNOSIS — I639 Cerebral infarction, unspecified: Secondary | ICD-10-CM | POA: Diagnosis not present

## 2023-03-10 LAB — LIPID PANEL
Cholesterol: 123 mg/dL (ref 0–200)
HDL: 29 mg/dL — ABNORMAL LOW (ref >40)
LDL Cholesterol: 79 mg/dL (ref 0–99)
Total CHOL/HDL Ratio: 4.2 RATIO
Triglycerides: 73 mg/dL (ref <150)
VLDL: 15 mg/dL (ref 0–40)

## 2023-03-10 MED ORDER — ONDANSETRON HCL 4 MG/2ML IJ SOLN
4.0000 mg | Freq: Four times a day (QID) | INTRAMUSCULAR | Status: DC | PRN
  Administered 2023-03-10: 4 mg via INTRAVENOUS
  Filled 2023-03-10: qty 2

## 2023-03-10 MED ORDER — ENSURE ENLIVE PO LIQD
237.0000 mL | Freq: Two times a day (BID) | ORAL | Status: DC
  Administered 2023-03-10 – 2023-03-13 (×6): 237 mL via ORAL

## 2023-03-10 MED ORDER — MECLIZINE HCL 25 MG PO TABS
25.0000 mg | ORAL_TABLET | Freq: Three times a day (TID) | ORAL | Status: DC | PRN
  Administered 2023-03-10 – 2023-03-13 (×4): 25 mg via ORAL
  Filled 2023-03-10 (×5): qty 1

## 2023-03-10 MED ORDER — ADULT MULTIVITAMIN W/MINERALS CH
1.0000 | ORAL_TABLET | Freq: Every day | ORAL | Status: DC
  Administered 2023-03-10 – 2023-03-13 (×4): 1 via ORAL
  Filled 2023-03-10 (×4): qty 1

## 2023-03-10 NOTE — Progress Notes (Signed)
Patient to MRI.

## 2023-03-10 NOTE — Progress Notes (Signed)
PROGRESS NOTE    Alex Dunn  ZOX:[id number] DOB: June 07, 1936 DOA: 03/09/2023 PCP: Sharlene Dory, DO    Chief Complaint  Patient presents with   Near Syncope   Ear Fullness    Brief Narrative:   Alex Dunn is a 87 y.o. male with medical history significant of BPH, HTN, MGUS, stage 3b CKD, and NSVT on Flecainide presenting with vertigo.   He reports that he awoke about 0430 and was profoundly dizzy.  He could not sit up, the room was spinning.  He couldn't focus his eyes.  He laid down and moved around with some improvement.  It improved enough that he was able to get up.  He still felt dizzy and it is ongoing.  No prior history of similar.  About 5-6 years ago, he was having night sweats and flu-like symptoms x 4-5 hours and then would resolve.  It became more occasional and a doctor was not able to figure it out.  He had one of those episodes Friday night, felt flu-like x 5-6 hours.  He felt better during the day Saturday but was still fatigued.  His last prior episode was 4-5 months ago.  Recently, he has been having episodes with standing like he was going to black out.  This often happens after working outside and his BP would be really low, in the 20s.  He talked to his heart doctor and recommended against working in the yard.  Once the BP normalizes he feels better.  With his episode overnight, he didn't notice any other neurologic symptoms.  He got a spinal cord stimulator about 6 weeks ago.  The stimulator works well for his neuropathy and controls the pain.  His orthostatic episodes preceded the stimulator.       ER Course:  [facility] to [facility] transfer, per Dr. Chipper Herb:   Sudden onset of vertigo this morning with significant ataxia and mild right facial droop. CT head and CTA negative. Patient had a spinal stimulator inserted <6 wks ago ED at [place] can not determine whether MRI will be safe. Neurology Dr. Georg Ruddle at [facility] aware.   Assessment & Plan:   Principal  Problem:   Vertigo Active Problems:   BPH (benign prostatic hyperplasia)   Night sweats   Essential hypertension   Nonsustained ventricular tachycardia (HCC)   Stage 3b chronic kidney disease (HCC)   Neuropathic pain   Dyslipidemia   Orthostatic hypotension   DNR (do not resuscitate)     Vertigo -Patient with episodes of flu-like illness for several years, Had one such episode Friday and was still recovering -Patient presents with severe vertigo, new right facial droop, headache, which is concerning for acute CVA -Nikkel concern for acute CVA, continue with aspirin, proceed with stroke workup including telemetry monitoring, LDL, and A1c and 2D echo. -MRI is pending, awaiting his spinal stimulator to be discharged so it can be done later in the day. -PT/OT/ST/Nutrition Consults   HTN -Allow permissive HTN for now -Treat BP only if >220/120, and then with goal of 15% reduction -Hold ARB and plan to restart in 48-72 hours   HLD -LDL 79, continue with Lipitor at increased dose 40 mg daily    Orthostatic hypotensive episodes -He describes episodes of what sounds like orthostatic hypotension -This occurs mainly after working in the yard (likely hypovolemic) or when rising from a seated position -He did not tolerate tamsulosin due to worsening orthostasis   Flu-like illness episodes with night sweats -He describes  episodes lasting 5-6 hours that result in myalgias, chills, night sweats, and generalized misery -After the episodes, he continues to feel some lingering fatigue -He has been evaluated for these episodes in the past without clear answer -He had not had such an episode in many months but did have an episode Friday with persistent lethargy over the weekend leading up to today's vertigo   Peripheral neuropathy -Recent (01/24/23) spinal cord stimulator placement at [facility] -He has had a good response with decreased neuropathic pain -Other episodes preceded stimulator placement  and he and his wife think these are unrelated -Continue prn Tramadol   SVT -Continue flecainide -Currently in NSR    Stage 3b CKD -Appears to be stable at this time -Attempt to avoid nephrotoxic medications -Recheck BMP in AM    BPH -Continue finasteride      DVT prophylaxis: Lovenox Code Status: DNR Family Communication: wife at bedside Disposition:   Status is: Observation    Consultants:  none   Subjective:  Patient reports poor night sleep due to dizziness/vertigo and headache  Objective: Vitals:   03/10/23 0400 03/10/23 0500 03/10/23 0800 03/10/23 1144  BP: (!) 146/75   (!) 155/83  Pulse: 68 (!) 59    Resp: 15 13    Temp: 97.7 F (36.5 C)  99.4 F (37.4 C) 98.1 F (36.7 C)  TempSrc: Oral  Oral Oral  SpO2: 90% 97%    Weight:      Height:        Intake/Output Summary (Last 24 hours) at 03/10/2023 1351 Last data filed at 03/10/2023 1000 Gross per 24 hour  Intake 1238.5 ml  Output 900 ml  Net 338.5 ml   Filed Weights   03/09/23 1021 03/09/23 1700  Weight: 87.5 kg 83.4 kg    Examination:  Awake Alert, Oriented X 3, No new F.N deficits, Normal affect Symmetrical Chest wall movement, Good air movement bilaterally, CTAB RRR,No Gallops,Rubs or new Murmurs, No Parasternal Heave +ve B.Sounds, Abd Soft, No tenderness, No rebound - guarding or rigidity. No Cyanosis, Clubbing or edema, No new Rash or bruise      Data Reviewed: I have personally reviewed following labs and imaging studies  CBC: Recent Labs  Lab 03/09/23 1049  WBC 6.1  NEUTROABS 3.8  HGB 14.4  HCT 43.8  MCV 96.7  PLT 170    Basic Metabolic Panel: Recent Labs  Lab 03/09/23 1049  NA 138  K 4.5  CL 104  CO2 26  GLUCOSE 102*  BUN 23  CREATININE 1.59*  CALCIUM 8.8*    GFR: Estimated Creatinine Clearance: 38 mL/min (A) (by C-G formula based on SCr of 1.59 mg/dL (H)).  Liver Function Tests: No results for input(s): "AST", "ALT", "ALKPHOS", "BILITOT", "PROT", "ALBUMIN"  in the last 168 hours.  CBG: No results for input(s): "GLUCAP" in the last 168 hours.   No results found for this or any previous visit (from the past 240 hour(s)).       Radiology Studies: DG Abd 1 View  Result Date: 03/09/2023 CLINICAL DATA:  [id number] Vertigo [id number] EXAM: ABDOMEN - 1 VIEW COMPARISON:  January 24, 2023. FINDINGS: Incomplete visualization of the lateral soft tissues. There is a spinal cord stimulator with leads projecting over the level of L1. Excreted contrast in the bladder. No dilated loops of bowel are seen. IMPRESSION: Spinal cord stimulator leads projecting over the level of L1. Electronically Signed   By: Meda Klinefelter M.D.   On: 03/09/2023 21:00   CT ANGIO  HEAD NECK W WO CM  Result Date: 03/09/2023 CLINICAL DATA:  Neuro deficit, acute, stroke suspected. Dizziness. Vertigo. EXAM: CT ANGIOGRAPHY HEAD AND NECK WITH AND WITHOUT CONTRAST TECHNIQUE: Multidetector CT imaging of the head and neck was performed using the standard protocol during bolus administration of intravenous contrast. Multiplanar CT image reconstructions and MIPs were obtained to evaluate the vascular anatomy. Carotid stenosis measurements (when applicable) are obtained utilizing NASCET criteria, using the distal internal carotid diameter as the denominator. RADIATION DOSE REDUCTION: This exam was performed according to the departmental dose-optimization program which includes automated exposure control, adjustment of the mA and/or kV according to patient size and/or use of iterative reconstruction technique. CONTRAST:  60mL OMNIPAQUE IOHEXOL 350 MG/ML SOLN COMPARISON:  MRI 08/20/2021 FINDINGS: CT HEAD FINDINGS Brain: No focal abnormality seen affecting the brainstem or cerebellum. 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 Mild chronic small-vessel ischemic change of the hemispheric white matter, less than often seen at this age. No large vessel territory stroke. No mass, hemorrhage, hydrocephalus or  extra-axial collection. Vascular: There is atherosclerotic calcification of the major vessels at the base of the brain. Skull: Negative Sinuses/Orbits: Clear/normal Other: No fluid in the middle ears or mastoid air cells. Review of the MIP images confirms the above findings CTA NECK FINDINGS Aortic arch: Aortic atherosclerosis.  No aneurysm. Right carotid system: Common carotid artery widely patent to the bifurcation. Mild calcified plaque at the carotid bifurcation and ICA bulb but no stenosis. Cervical ICA normal beyond that. Left carotid system: Common carotid artery widely patent to the bifurcation. Minimal calcified plaque at the bifurcation but no stenosis. Cervical ICA normal beyond that. Vertebral arteries: Left vertebral artery is dominant. Plaque adjacent to both vertebral artery origins but no stenosis. Both vertebral arteries are patent through the cervical region to the foramen magnum. Skeleton: Ordinary chronic cervical spondylosis. Other neck: No mass or lymphadenopathy. Upper chest: No active disease at the lung apices. Review of the MIP images confirms the above findings CTA HEAD FINDINGS Anterior circulation: Both internal carotid arteries are patent through the skull base and siphon regions. No siphon stenosis. The anterior and middle cerebral vessels are patent. No large vessel occlusion or proximal stenosis. No aneurysm or vascular malformation. Posterior circulation: Both vertebral arteries are patent through the foramen magnum. There is some atherosclerotic calcification of the vertebral artery V4 segments but no stenosis. Basilar artery is tortuous but not stenotic. Superior cerebellar and posterior cerebral arteries show flow. Large patent posterior communicating artery on the right. Venous sinuses: Patent and normal. Anatomic variants: None significant. Review of the MIP images confirms the above findings IMPRESSION: 1. No acute head CT finding. Old small vessel infarction of the left  thalamus. Mild chronic small-vessel ischemic change of the white matter, less than often seen at this age. 2. No intracranial large vessel occlusion or proximal stenosis. 3. Aortic atherosclerosis. Mild atherosclerotic change at both carotid bifurcations but no stenosis. 4. Mild atherosclerotic change of the vertebral arteries but no flow limiting stenosis. 5. Today's study does not show an explanation for the clinical presentation. Aortic Atherosclerosis (ICD10-I70.0). Electronically Signed   By: Paulina Fusi M.D.   On: 03/09/2023 12:26        Scheduled Meds:  aspirin EC  81 mg Oral Daily   atorvastatin  40 mg Oral Daily   enoxaparin (LOVENOX) injection  40 mg Subcutaneous Q24H   finasteride  5 mg Oral Daily   flecainide  50 mg Oral BID   Continuous Infusions:  sodium chloride 50 mL/hr at 03/09/23 2023     LOS: 0 days       Huey Bienenstock, MD [facility]   To contact the attending provider between 7A-7P or the covering provider during after hours 7P-7A, please log into the web site [email or web address] and access using universal [place] password for that web site. If you do not have the password, please call the hospital operator.  03/10/2023, 1:51 PM

## 2023-03-10 NOTE — Progress Notes (Signed)
Initial Nutrition Assessment  DOCUMENTATION CODES:   Not applicable  INTERVENTION:  Multivitamin w/ minerals daily Ensure Enlive po BID, each supplement provides 350 kcal and 20 grams of protein. Liberalize pt diet to regular due to decreased appetite.  NUTRITION DIAGNOSIS:   Inadequate oral intake related to decreased appetite as evidenced by per patient/family report.  GOAL:   Patient will meet greater than or equal to 90% of their needs  MONITOR:   PO intake, Supplement acceptance, Labs, I & O's  REASON FOR ASSESSMENT:   Consult Assessment of nutrition requirement/status  ASSESSMENT:   87 y.o. male presented to the ED with vertigo. PMH includes BPH, CKD IIIb, GERD, HTN, CVA, and MGUS.  Pt laying in bed. Reports that his appetite has been poor since yesterday due to being dizzy. States that typically he has a good appetite, eats 2-3 meals per day. Always has cereal for breakfast except once a week he has a honey bun. Eats a variety of things for dinner, his wife may cook or they may eat out. Does not drink any nutritional supplements. Denies any nausea or vomiting. Pt recently had a procedure completed a few weeks prior, denies ay changes in appetite post-op.  Reports that his weight has remained stable at 193# for about a year now. Does not use any assistance while ambulating.   Discussed using Ensure with decreased appetite to maintain lean muscle mass; pt agreeable and prefers chocolate flavor.   Medications reviewed.  Labs reviewed.    NUTRITION - FOCUSED PHYSICAL EXAM:  Flowsheet Row Most Recent Value  Orbital Region No depletion  Upper Arm Region No depletion  Thoracic and Lumbar Region No depletion  Buccal Region No depletion  Temple Region No depletion  Clavicle Bone Region Mild depletion  Clavicle and Acromion Bone Region Mild depletion  Scapular Bone Region Mild depletion  Dorsal Hand Severe depletion  Patellar Region Mild depletion  Anterior Thigh  Region Mild depletion  Posterior Calf Region Mild depletion  Edema (RD Assessment) None  Hair Reviewed  Eyes Reviewed  Mouth Reviewed  Skin Reviewed  Nails Reviewed   Diet Order:   Diet Order             Diet regular Room service appropriate? Yes with Assist; Fluid consistency: Thin  Diet effective now                  EDUCATION NEEDS:   Education needs have been addressed  Skin:  Skin Assessment: Reviewed RN Assessment  Last BM:  5/4  Height:  Ht Readings from Last 1 Encounters:  03/09/23 6' (1.829 m)   Weight:  Wt Readings from Last 1 Encounters:  03/09/23 83.4 kg   Ideal Body Weight:  80.9 kg  BMI:  Body mass index is 24.94 kg/m.  Estimated Nutritional Needs:  Kcal:  1900-2100 Protein:  95-115 grams Fluid:  >/= 1.9 L   Kirby Crigler RD, LDN Clinical Dietitian See [facility] for contact information.

## 2023-03-10 NOTE — Evaluation (Signed)
Physical Therapy Evaluation Patient Details Name: Alex Dunn MRN: [id number] DOB: 04-20-1936 Today's Date: 03/10/2023  History of Present Illness  Pt ia an 87 year old man admitted with severe dizziness. Head CT negative for acute changes, MRI pending. PMH: spinal cord stimulator x 6 weeks, peripheral neuropathy, BPH, HTN, CKD 3, HLD, orthostatic hypotension, tachycardia.  Clinical Impression  Pt admitted with above diagnosis. Pt noted to left anterior canal BPPV and treated with Epley maneuver. Pt reported he felt like dizziness was better post maneuver but not gone. Will check back tomorrow and re assess.   Pt currently with functional limitations due to the deficits listed below (see PT Problem List). Pt will benefit from acute skilled PT to increase their independence and safety with mobility to allow discharge.          Recommendations for follow up therapy are one component of a multi-disciplinary discharge planning process, led by the attending physician.  Recommendations may be updated based on patient status, additional functional criteria and insurance authorization.  Follow Up Recommendations       Assistance Recommended at Discharge Intermittent Supervision/Assistance  Patient can return home with the following  A little help with walking and/or transfers;Assistance with cooking/housework;Assist for transportation;Help with stairs or ramp for entrance    Equipment Recommendations Other (comment) (TBA)  Recommendations for Other Services       Functional Status Assessment Patient has had a recent decline in their functional status and demonstrates the ability to make significant improvements in function in a reasonable and predictable amount of time.     Precautions / Restrictions Precautions Precautions: Fall Restrictions Weight Bearing Restrictions: No      Mobility  Bed Mobility Overal bed mobility: Needs Assistance Bed Mobility: Supine to Sit     Supine to  sit: Min assist     General bed mobility comments: Pt noted to test positive for left anterior canal BPPV therefore treated with Epley manuever.  Needed assist to come to eOB due to dizziness after Epley.  Assisted back to bed at end of treatment to give pt's BPPV time to settle.  Pt reports that dizziness was much better after treatment but not totally gone.    Transfers                   General transfer comment: TBA    Ambulation/Gait                  Stairs            Wheelchair Mobility    Modified Rankin (Stroke Patients Only)       Balance                                             Pertinent Vitals/Pain Pain Assessment Pain Assessment: No/denies pain    Home Living Family/patient expects to be discharged to:: Private residence Living Arrangements: Spouse/significant other Available Help at Discharge: Family;Available 24 hours/day Type of Home: House         Home Layout: One level Home Equipment: Agricultural consultant (2 wheels)      Prior Function Prior Level of Function : Independent/Modified Independent                     Hand Dominance   Dominant Hand: Right    Extremity/Trunk Assessment  Upper Extremity Assessment Upper Extremity Assessment: Defer to OT evaluation    Lower Extremity Assessment Lower Extremity Assessment: Generalized weakness    Cervical / Trunk Assessment Cervical / Trunk Assessment: Normal  Communication   Communication: No difficulties  Cognition Arousal/Alertness: Awake/alert Behavior During Therapy: WFL for tasks assessed/performed Overall Cognitive Status: Within Functional Limits for tasks assessed                                          General Comments      Exercises     Assessment/Plan    PT Assessment Patient needs continued PT services  PT Problem List Decreased activity tolerance;Decreased balance;Decreased mobility;Decreased safety  awareness;Decreased knowledge of use of DME;Decreased knowledge of precautions (vertigo.BPPV)       PT Treatment Interventions DME instruction;Gait training;Functional mobility training;Therapeutic activities;Therapeutic exercise;Balance training;Patient/family education (Vestibular rehab)    PT Goals (Current goals can be found in the Care Plan section)  Acute Rehab PT Goals Patient Stated Goal: to go home PT Goal Formulation: With patient Time For Goal Achievement: 03/24/23 Potential to Achieve Goals: Good    Frequency Min 3X/week     Co-evaluation               AM-PAC PT "6 Clicks" Mobility  Outcome Measure Help needed turning from your back to your side while in a flat bed without using bedrails?: A Little Help needed moving from lying on your back to sitting on the side of a flat bed without using bedrails?: A Little Help needed moving to and from a bed to a chair (including a wheelchair)?: A Lot Help needed standing up from a chair using your arms (e.g., wheelchair or bedside chair)?: A Lot Help needed to walk in hospital room?: A Lot Help needed climbing 3-5 steps with a railing? : A Lot 6 Click Score: 14    End of Session   Activity Tolerance: Patient limited by fatigue (limited by dizziness) Patient left: in bed;with call bell/phone within reach;with bed alarm set Nurse Communication: Mobility status PT Visit Diagnosis: BPPV;Dizziness and giddiness (R42) BPPV - Right/Left : Left    Time: 1610-9604 PT Time Calculation (min) (ACUTE ONLY): 15 min   Charges:   PT Evaluation $PT Eval Low Complexity: 1 Low          [name],PT Acute Rehab Services [phone]   Bevelyn Buckles 03/10/2023, 4:14 PM

## 2023-03-10 NOTE — Care Management (Signed)
  Transition of Care [facility]) Screening Note   Patient Details  Name: Alex Dunn Date of Birth: 1936/08/06   Transition of Care [facility]) CM/SW Contact:    Gordy Clement, RN Phone Number: 03/10/2023, 10:05 AM    Transition of Care Department [facility]) has reviewed patient . He is from home with his Wife. We will continue to monitor patient advancement through interdisciplinary progression rounds. If new patient transition needs arise, please place a TOC consult.

## 2023-03-10 NOTE — Evaluation (Signed)
Occupational Therapy Evaluation Patient Details Name: Alex Dunn MRN: [id number] DOB: [birth year] Today's Date: 03/10/2023   History of Present Illness Pt ia an 87 year old man admitted with severe dizziness. Head CT negative for acute changes, MRI pending. PMH: spinal cord stimulator x 6 weeks, peripheral neuropathy, BPH, HTN, CKD 3, HLD, orthostatic hypotension, tachycardia.   Clinical Impression   Pt was functioning modified independently in basic ADLs and mobility prior to admission. He is a retired Art gallery manager and lives with his supportive wife. Pt reports he has had to modify his activity, particularly hobbies and yardwork, due to LE peripheral neuropathy and sits/stands for showering. His new spinal stimulator has helped his LE pain. Pt presents with dizziness and generalized weakness. Limited evaluation due to pt charging spinal stimulator and having to be in sitting or sidelying to charge prior to impending MRI. Do not anticipate pt will require post acute OT. Will follow to address safety and fall prevention during ADLs and ADL transfers.      Recommendations for follow up therapy are one component of a multi-disciplinary discharge planning process, led by the attending physician.  Recommendations may be updated based on patient status, additional functional criteria and insurance authorization.   Assistance Recommended at Discharge Frequent or constant Supervision/Assistance  Patient can return home with the following A little help with walking and/or transfers;A little help with bathing/dressing/bathroom;Assistance with cooking/housework;Assist for transportation;Help with stairs or ramp for entrance    Functional Status Assessment  Patient has had a recent decline in their functional status and demonstrates the ability to make significant improvements in function in a reasonable and predictable amount of time.  Equipment Recommendations  Other (comment) (to be determined)     Recommendations for Other Services       Precautions / Restrictions Precautions Precautions: Fall Restrictions Weight Bearing Restrictions: No      Mobility Bed Mobility Overal bed mobility: Needs Assistance Bed Mobility: Sidelying to Sit, Sit to Sidelying   Sidelying to sit: Supervision     Sit to sidelying: Supervision      Transfers                   General transfer comment: NT      Balance                                           ADL either performed or assessed with clinical judgement   ADL Overall ADL's : Needs assistance/impaired Eating/Feeding: Independent   Grooming: Set up;Sitting   Upper Body Bathing: Set up;Sitting   Lower Body Bathing: Moderate assistance;Sitting/lateral leans   Upper Body Dressing : Set up;Sitting   Lower Body Dressing: Set up;Sitting/lateral leans Lower Body Dressing Details (indicate cue type and reason): can don socks with figure 4 method                     Vision Ability to See in Adequate Light: 0 Adequate Patient Visual Report: No change from baseline       Perception     Praxis      Pertinent Vitals/Pain Pain Assessment Pain Assessment: No/denies pain     Hand Dominance Right   Extremity/Trunk Assessment Upper Extremity Assessment Upper Extremity Assessment: Defer to OT evaluation   Lower Extremity Assessment Lower Extremity Assessment: Generalized weakness   Cervical / Trunk Assessment Cervical / Trunk Assessment:  Normal   Communication Communication Communication: No difficulties   Cognition Arousal/Alertness: Awake/alert Behavior During Therapy: WFL for tasks assessed/performed Overall Cognitive Status: Within Functional Limits for tasks assessed                                       General Comments       Exercises     Shoulder Instructions      Home Living Family/patient expects to be discharged to:: Private residence Living  Arrangements: Spouse/significant other Available Help at Discharge: Family;Available 24 hours/day Type of Home: House       Home Layout: One level     Bathroom Shower/Tub: Producer, television/film/video: Handicapped height     Home Equipment: Agricultural consultant (2 wheels)          Prior Functioning/Environment Prior Level of Function : Independent/Modified Independent                        OT Problem List: Decreased activity tolerance;Impaired balance (sitting and/or standing)      OT Treatment/Interventions: Self-care/ADL training;Therapeutic activities;Patient/family education;Balance training;DME and/or AE instruction    OT Goals(Current goals can be found in the care plan section) Acute Rehab OT Goals OT Goal Formulation: With patient Time For Goal Achievement: 03/24/23 Potential to Achieve Goals: Good ADL Goals Pt Will Perform Grooming: with supervision;standing Pt Will Perform Lower Body Bathing: with supervision;sit to/from stand Pt Will Perform Lower Body Dressing: with supervision;sit to/from stand Pt Will Transfer to Toilet: with supervision;ambulating;bedside commode Pt Will Perform Toileting - Clothing Manipulation and hygiene: with supervision;sit to/from stand Additional ADL Goal #1: Pt will perform bed mobility modified independently.  OT Frequency: Min 2X/week    Co-evaluation              AM-PAC OT "6 Clicks" Daily Activity     Outcome Measure Help from another person eating meals?: None Help from another person taking care of personal grooming?: A Little Help from another person toileting, which includes using toliet, bedpan, or urinal?: A Little Help from another person bathing (including washing, rinsing, drying)?: A Little Help from another person to put on and taking off regular upper body clothing?: A Little Help from another person to put on and taking off regular lower body clothing?: A Little 6 Click Score: 19   End of  Session    Activity Tolerance: Other (comment) (awaiting spinal stimulator to charge) Patient left: in bed;with call bell/phone within reach;with bed alarm set;with family/visitor present  OT Visit Diagnosis: Dizziness and giddiness (R42)                Time: 1610-9604 OT Time Calculation (min): 52 min Charges:  OT General Charges $OT Visit: 1 Visit OT Evaluation $OT Eval Moderate Complexity: 1 Mod  Berna Spare, OTR/L Acute Rehabilitation Services Office: [phone]   Evern Bio 03/10/2023, 4:18 PM

## 2023-03-11 DIAGNOSIS — I69392 Facial weakness following cerebral infarction: Secondary | ICD-10-CM | POA: Diagnosis not present

## 2023-03-11 DIAGNOSIS — I129 Hypertensive chronic kidney disease with stage 1 through stage 4 chronic kidney disease, or unspecified chronic kidney disease: Secondary | ICD-10-CM | POA: Diagnosis present

## 2023-03-11 DIAGNOSIS — D472 Monoclonal gammopathy: Secondary | ICD-10-CM | POA: Diagnosis present

## 2023-03-11 DIAGNOSIS — Z66 Do not resuscitate: Secondary | ICD-10-CM | POA: Diagnosis present

## 2023-03-11 DIAGNOSIS — I471 Supraventricular tachycardia, unspecified: Secondary | ICD-10-CM | POA: Diagnosis present

## 2023-03-11 DIAGNOSIS — R61 Generalized hyperhidrosis: Secondary | ICD-10-CM | POA: Diagnosis present

## 2023-03-11 DIAGNOSIS — R270 Ataxia, unspecified: Secondary | ICD-10-CM | POA: Diagnosis present

## 2023-03-11 DIAGNOSIS — N401 Enlarged prostate with lower urinary tract symptoms: Secondary | ICD-10-CM | POA: Diagnosis not present

## 2023-03-11 DIAGNOSIS — I472 Ventricular tachycardia, unspecified: Secondary | ICD-10-CM | POA: Diagnosis present

## 2023-03-11 DIAGNOSIS — I4729 Other ventricular tachycardia: Secondary | ICD-10-CM | POA: Diagnosis not present

## 2023-03-11 DIAGNOSIS — I493 Ventricular premature depolarization: Secondary | ICD-10-CM | POA: Diagnosis present

## 2023-03-11 DIAGNOSIS — Q613 Polycystic kidney, unspecified: Secondary | ICD-10-CM | POA: Diagnosis not present

## 2023-03-11 DIAGNOSIS — N400 Enlarged prostate without lower urinary tract symptoms: Secondary | ICD-10-CM | POA: Diagnosis present

## 2023-03-11 DIAGNOSIS — I10 Essential (primary) hypertension: Secondary | ICD-10-CM | POA: Diagnosis not present

## 2023-03-11 DIAGNOSIS — R42 Dizziness and giddiness: Secondary | ICD-10-CM | POA: Diagnosis present

## 2023-03-11 DIAGNOSIS — Z9682 Presence of neurostimulator: Secondary | ICD-10-CM | POA: Diagnosis not present

## 2023-03-11 DIAGNOSIS — K219 Gastro-esophageal reflux disease without esophagitis: Secondary | ICD-10-CM | POA: Diagnosis present

## 2023-03-11 DIAGNOSIS — Z885 Allergy status to narcotic agent status: Secondary | ICD-10-CM | POA: Diagnosis not present

## 2023-03-11 DIAGNOSIS — H6121 Impacted cerumen, right ear: Secondary | ICD-10-CM | POA: Diagnosis present

## 2023-03-11 DIAGNOSIS — E785 Hyperlipidemia, unspecified: Secondary | ICD-10-CM | POA: Diagnosis present

## 2023-03-11 DIAGNOSIS — H5500 Unspecified nystagmus: Secondary | ICD-10-CM | POA: Diagnosis present

## 2023-03-11 DIAGNOSIS — Z8249 Family history of ischemic heart disease and other diseases of the circulatory system: Secondary | ICD-10-CM | POA: Diagnosis not present

## 2023-03-11 DIAGNOSIS — M170 Bilateral primary osteoarthritis of knee: Secondary | ICD-10-CM | POA: Diagnosis present

## 2023-03-11 DIAGNOSIS — H8110 Benign paroxysmal vertigo, unspecified ear: Secondary | ICD-10-CM | POA: Diagnosis present

## 2023-03-11 DIAGNOSIS — E538 Deficiency of other specified B group vitamins: Secondary | ICD-10-CM | POA: Diagnosis present

## 2023-03-11 DIAGNOSIS — N1832 Chronic kidney disease, stage 3b: Secondary | ICD-10-CM | POA: Diagnosis present

## 2023-03-11 DIAGNOSIS — I951 Orthostatic hypotension: Secondary | ICD-10-CM | POA: Diagnosis present

## 2023-03-11 DIAGNOSIS — G459 Transient cerebral ischemic attack, unspecified: Secondary | ICD-10-CM | POA: Diagnosis present

## 2023-03-11 DIAGNOSIS — G629 Polyneuropathy, unspecified: Secondary | ICD-10-CM | POA: Diagnosis present

## 2023-03-11 LAB — BASIC METABOLIC PANEL
Anion gap: 5 (ref 5–15)
BUN: 20 mg/dL (ref 8–23)
CO2: 25 mmol/L (ref 22–32)
Calcium: 8.6 mg/dL — ABNORMAL LOW (ref 8.9–10.3)
Chloride: 107 mmol/L (ref 98–111)
Creatinine, Ser: 1.6 mg/dL — ABNORMAL HIGH (ref 0.61–1.24)
GFR, Estimated: 42 mL/min — ABNORMAL LOW (ref >60)
Glucose, Bld: 90 mg/dL (ref 70–99)
Potassium: 4.4 mmol/L (ref 3.5–5.1)
Sodium: 137 mmol/L (ref 135–145)

## 2023-03-11 LAB — CBC WITH DIFFERENTIAL/PLATELET
Abs Immature Granulocytes: 0.01 10*3/uL (ref 0.00–0.07)
Basophils Absolute: 0 10*3/uL (ref 0.0–0.1)
Basophils Relative: 1 %
Eosinophils Absolute: 0.2 10*3/uL (ref 0.0–0.5)
Eosinophils Relative: 3 %
HCT: 39.4 % (ref 39.0–52.0)
Hemoglobin: 13 g/dL (ref 13.0–17.0)
Immature Granulocytes: 0 %
Lymphocytes Relative: 20 %
Lymphs Abs: 1.3 10*3/uL (ref 0.7–4.0)
MCH: 31.8 pg (ref 26.0–34.0)
MCHC: 33 g/dL (ref 30.0–36.0)
MCV: 96.3 fL (ref 80.0–100.0)
Monocytes Absolute: 0.6 10*3/uL (ref 0.1–1.0)
Monocytes Relative: 10 %
Neutro Abs: 4.5 10*3/uL (ref 1.7–7.7)
Neutrophils Relative %: 66 %
Platelets: 154 10*3/uL (ref 150–400)
RBC: 4.09 MIL/uL — ABNORMAL LOW (ref 4.22–5.81)
RDW: 13 % (ref 11.5–15.5)
WBC: 6.7 10*3/uL (ref 4.0–10.5)
nRBC: 0 % (ref 0.0–0.2)

## 2023-03-11 LAB — PHOSPHORUS: Phosphorus: 3.7 mg/dL (ref 2.5–4.6)

## 2023-03-11 LAB — MAGNESIUM: Magnesium: 2.1 mg/dL (ref 1.7–2.4)

## 2023-03-11 MED ORDER — LOSARTAN POTASSIUM 50 MG PO TABS
50.0000 mg | ORAL_TABLET | Freq: Every day | ORAL | Status: DC
  Administered 2023-03-11 – 2023-03-13 (×3): 50 mg via ORAL
  Filled 2023-03-11 (×3): qty 1

## 2023-03-11 NOTE — Progress Notes (Addendum)
Physical Therapy Treatment Patient Details Name: Alex Dunn MRN: [id number] DOB: 1935-11-16 Today's Date: 03/11/2023   History of Present Illness Pt ia an 87 year old man admitted with severe dizziness. Head CT negative for acute changes, MRI pending. PMH: spinal cord stimulator x 6 weeks, peripheral neuropathy, BPH, HTN, CKD 3, HLD, orthostatic hypotension, tachycardia.    PT Comments    Pt reports increase in symptoms overnight, feeling similar to symptoms which brought him to the ED. Pt reports he notes increase in dizziness when turning his head to the right side this morning. PT performs epley for treatment of left ear initially as this seemed to provide some immediate relief with PT yesterday, pt reports worsening of symptoms with this today. PT then attempts epley for treatment of R ear, with possible small improvement in symptoms based on pt report. Pt initially requires assistance to transfer and demonstrates great instability with first 2 ambulation attempts. 3rd ambulation attempt shows improved stability. Pt does report a history of chronic gait instability, which is part of the reason for his recent spinal stimulator implant. PT provides VOR x 1 exercise in an effort to provide adaptation. PT will follow up tomorrow for further treatment.   Recommendations for follow up therapy are one component of a multi-disciplinary discharge planning process, led by the attending physician.  Recommendations may be updated based on patient status, additional functional criteria and insurance authorization.  Follow Up Recommendations       Assistance Recommended at Discharge Intermittent Supervision/Assistance  Patient can return home with the following A little help with walking and/or transfers;Assistance with cooking/housework;Assist for transportation;Help with stairs or ramp for entrance;A little help with bathing/dressing/bathroom   Equipment Recommendations  Rolling walker (2  wheels);BSC/3in1    Recommendations for Other Services       Precautions / Restrictions Precautions Precautions: Fall Restrictions Weight Bearing Restrictions: No     Mobility  Bed Mobility Overal bed mobility: Needs Assistance Bed Mobility: Supine to Sit, Sit to Supine, Sidelying to Sit, Rolling Rolling: Supervision Sidelying to sit: Min assist (minA during epley maneuvers) Supine to sit: Supervision Sit to supine: Supervision        Transfers Overall transfer level: Needs assistance Equipment used: Rolling walker (2 wheels) Transfers: Sit to/from Stand Sit to Stand: Min assist, Min guard           General transfer comment: minA with initial stand, progresses to minG with final attempt    Ambulation/Gait Ambulation/Gait assistance: Min assist, Min guard Gait Distance (Feet): 20 Feet (additional trial of 20' and 3') Assistive device: Rolling walker (2 wheels) Gait Pattern/deviations: Step-through pattern, Drifts right/left Gait velocity: reduced Gait velocity interpretation: <1.31 ft/sec, indicative of household ambulator   General Gait Details: pt with instability and greatly increased sway during initial ambulation attempt of 3'. Pt later ambulates 20' with minA and R lateral lean. Final attempt with improved stability, minG for 20'   Stairs             Wheelchair Mobility    Modified Rankin (Stroke Patients Only)       Balance Overall balance assessment: Needs assistance Sitting-balance support: Single extremity supported, No upper extremity supported, Feet supported Sitting balance-Leahy Scale: Fair     Standing balance support: Bilateral upper extremity supported, Reliant on assistive device for balance Standing balance-Leahy Scale: Poor  Cognition Arousal/Alertness: Awake/alert Behavior During Therapy: WFL for tasks assessed/performed Overall Cognitive Status: Within Functional Limits for tasks  assessed                                          Exercises Other Exercises Other Exercises: VOR x1 exercise    General Comments General comments (skin integrity, edema, etc.): VSS on RA. PT assists pt in performing epley maneuver for treatment of L anterior canal BPPV, pt reports worsening of symptoms. PT laters assists pt in performing epley for R BPPV as pt reports increased symptoms with head turns to right side, pt reports possible mild improvement in symptoms.      Pertinent Vitals/Pain Pain Assessment Pain Assessment: No/denies pain    Home Living                          Prior Function            PT Goals (current goals can now be found in the care plan section) Acute Rehab PT Goals Patient Stated Goal: to go home Progress towards PT goals: Progressing toward goals (slow progress)    Frequency    Min 5X/week      PT Plan Current plan remains appropriate    Co-evaluation              AM-PAC PT "6 Clicks" Mobility   Outcome Measure  Help needed turning from your back to your side while in a flat bed without using bedrails?: A Little Help needed moving from lying on your back to sitting on the side of a flat bed without using bedrails?: A Little Help needed moving to and from a bed to a chair (including a wheelchair)?: A Little Help needed standing up from a chair using your arms (e.g., wheelchair or bedside chair)?: A Little Help needed to walk in hospital room?: A Little Help needed climbing 3-5 steps with a railing? : Total 6 Click Score: 16    End of Session Equipment Utilized During Treatment: Gait belt Activity Tolerance: Patient tolerated treatment well Patient left: in chair;with call bell/phone within reach;with chair alarm set;with nursing/sitter in room;with family/visitor present Nurse Communication: Mobility status PT Visit Diagnosis: BPPV;Dizziness and giddiness (R42)     Time: 4098-1191 PT Time  Calculation (min) (ACUTE ONLY): 79 min  Charges:  $Gait Training: 8-22 mins $Therapeutic Exercise: 8-22 mins $Therapeutic Activity: 8-22 mins $Canalith Rep Proc: 23-37 mins                     Arlyss Gandy, PT, DPT Acute Rehabilitation Office [phone]    Arlyss Gandy 03/11/2023, 9:50 AM

## 2023-03-11 NOTE — Consult Note (Signed)
NEUROLOGY CONSULTATION NOTE   Date of service: Mar 11, 2023 Patient Name: Alex Dunn MRN:  [id number] DOB:  1936/04/02 Reason for consult: Persistent dizziness _ _ _   _ __   _ __ _ _  __ __   _ __   __ _  History of Present Illness   Mr. Massaro is an 87 y/o person living with a history of MGUS, chronic severe sensorimotor polyneuropathy in bilateral legs s/p spinal cord stimulator placement, chronic lacunar infarct within the left thalamus with residual deficit of right sided facial droop, HTN, BPH, CKD 3b, polycystic kidney disease, SVT on flecainide who presented to the ED 3 days ago after persistent dizziness. He was in his usual state of health when he woke up 3 days ago and he had dizziness described as the room spinning. He laid back down and had some improvement with certain positions. When he stood back up again the symptoms persisted and he presented to the ED.   He denies similar symptoms in the past, no history of vertigo or persistent dizziness. Two days prior to this he did have an episode of fevers/night sweats. This has been ongoing for the past 6 years and he notes that doctors have not been able to tell him why. He has not had an episode in the past 4-5 months. He denies any pain or ringing/buzzing in his ear. Did notice some fullness in the right ear and had the ear irrigated in the ED for a cerumen impaction. Has not noticed any changes in his dizziness after this was done.   Dizziness has persisted with specific movements, he notes improvement after epley maneuver performed by vestibular rehab but has not resolved. Also noticed some improvement after meclizine given today.   Denies pain with eye movement or headaches.  nausea, vomiting, diarrhea. No other acute illness. Denies new numbness, tingling, or weakness. He has a spinal cord stimulator for his chronic neuropathy which has improved since it was placed 6 weeks ago. Has chronic right sided facial droop from a prior  CVA.     ROS   Constitutional One episode of fever, night sweats  HEENT R. Ear fullness  Respiratory Denies SOB and cough.   CV Denies palpitations and CP   GI Denies abdominal pain, nausea, vomiting and diarrhea.   MSK Denies myalgia   Neurological Denies headache and syncope. Endorses dizziness   Past History   Past Medical History:  Diagnosis Date   Abdominal aortic atherosclerosis (HCC) 04/15/2018   ANEMIA-IRON DEFICIENCY 06/23/2007   B12 deficiency 06/13/2020   BPH (benign prostatic hyperplasia) 06/23/2007   Qualifier: Diagnosis of  By: Marcelyn Ditty, RN, Katy Fitch   Formatting of this note might be different from the original. Formatting of this note might be different from the original. Qualifier: Diagnosis of  By: Marcelyn Ditty, RN, Katy Fitch   Degeneration of lumbar intervertebral disc 06/30/2018   Diverticulosis    Elevated diaphragm 04/15/2018   Essential hypertension 04/15/2018   Gastro-esophageal reflux disease without esophagitis 06/23/2007   Qualifier: Diagnosis of  By: Marcelyn Ditty, RN, Katy Fitch   Formatting of this note might be different from the original. Formatting of this note might be different from the original. Qualifier: Diagnosis of  By: Marcelyn Ditty, RN, Katy Fitch   Lumbar radiculopathy 05/06/2019   Lumbosacral spondylosis without myelopathy 03/10/2019   Monoclonal gammopathy of unknown significance (MGUS) 08/24/2018   Multiple renal cysts 04/06/2018   Nonsustained ventricular tachycardia (HCC) 11/03/2019   Primary osteoarthritis  of both knees 06/30/2018   Bilateral moderate with moderate chondromalacia patella Formatting of this note might be different from the original. Formatting of this note might be different from the original. Bilateral moderate with moderate chondromalacia patella   Stage 3b chronic kidney disease (HCC) 06/21/2020   Past Surgical History:  Procedure Laterality Date   HAND SURGERY     LUNG SURGERY     for collapsed lung   SPINAL CORD STIMULATOR IMPLANT   01/24/2023   Family History  Problem Relation Age of Onset   Heart attack Mother    Heart attack Father    Heart attack Brother        "leaky valve"   Neuromuscular disorder Neg Hx    Social History   Socioeconomic History   Marital status: Married    Spouse name: Not on file   Number of children: 1   Years of education: Not on file   Highest education level: High school graduate  Occupational History   Occupation: retired  Tobacco Use   Smoking status: Never    Passive exposure: Never   Smokeless tobacco: Never  Vaping Use   Vaping Use: Never used  Substance and Sexual Activity   Alcohol use: Never   Drug use: Never   Sexual activity: Not on file  Other Topics Concern   Not on file  Social History Narrative   Lives at home with his wife   Retired   Right handed   Caffeine: about 2 cups daily   Social Determinants of Health   Financial Resource Strain: Low Risk  (01/08/2022)   Overall Financial Resource Strain (CARDIA)    Difficulty of Paying Living Expenses: Not hard at all  Food Insecurity: No Food Insecurity (01/08/2022)   Hunger Vital Sign    Worried About Running Out of Food in the Last Year: Never true    Ran Out of Food in the Last Year: Never true  Transportation Needs: No Transportation Needs (01/08/2022)   PRAPARE - Administrator, Civil Service (Medical): No    Lack of Transportation (Non-Medical): No  Physical Activity: Inactive (01/08/2022)   Exercise Vital Sign    Days of Exercise per Week: 0 days    Minutes of Exercise per Session: 0 min  Stress: No Stress Concern Present (01/08/2022)   [employer] of Occupational Health - Occupational Stress Questionnaire    Feeling of Stress : Not at all  Social Connections: Moderately Integrated (01/08/2022)   Social Connection and Isolation Panel [NHANES]    Frequency of Communication with Friends and Family: Three times a week    Frequency of Social Gatherings with Friends and Family: More than  three times a week    Attends Religious Services: More than 4 times per year    Active Member of Clubs or Organizations: No    Attends Engineer, structural: Never    Marital Status: Married   Allergies  Allergen Reactions   Amitriptyline Other (See Comments)    SLEPT FOR 18-19 HOURS STRAIGHT   Codeine Phosphate Other (See Comments)    Seen odd things crawling up wall   Pregabalin Other (See Comments)    Drowsy, nightmares, stumbling   Tamsulosin Other (See Comments)    Faint feeling    Tape Other (See Comments)    Skin irritation   Lexapro [Escitalopram Oxalate] Other (See Comments)    shaking    Medications   Medications Prior to Admission  Medication Sig Dispense  Refill Last Dose   acetaminophen (TYLENOL) 325 MG tablet Take 650 mg by mouth 4 (four) times daily as needed for mild pain or moderate pain (pain).   Past Week   aspirin EC 81 MG tablet Take 81 mg by mouth daily. Swallow whole.   Past Week   atorvastatin (LIPITOR) 10 MG tablet Take 1 tablet (10 mg total) by mouth daily. 90 tablet 3 Past Week   Cholecalciferol (VITAMIN D) 50 MCG (2000 UT) CAPS Take 2,000 Units by mouth daily at 6 (six) AM.   Past Week   cyanocobalamin 1000 MCG tablet Take 1,000 mcg by mouth daily.   Past Week   flecainide (TAMBOCOR) 50 MG tablet TAKE 1 TABLET(50 MG) BY MOUTH TWICE DAILY (Patient taking differently: Take 50 mg by mouth 2 (two) times daily.) 180 tablet 3 Past Week   losartan (COZAAR) 50 MG tablet TAKE 1 TABLET(50 MG) BY MOUTH DAILY (Patient taking differently: Take 50 mg by mouth daily.) 90 tablet 2 Past Week   Multiple Vitamins-Minerals (PRESERVISION AREDS) CAPS Take 1 capsule by mouth daily.   Past Week   pantoprazole (PROTONIX) 40 MG tablet Take 1 tablet (40 mg total) by mouth daily as needed. (Patient taking differently: Take 40 mg by mouth daily as needed Genella Rife).) 90 tablet 3    traMADol (ULTRAM) 50 MG tablet Take 1 tablet (50 mg total) by mouth every 12 (twelve) hours as  needed for moderate pain or severe pain. TAKE 1 TABLET(50 MG) BY MOUTH EVERY 12 HOURS AS NEEDED FOR MODERATE PAIN (Patient taking differently: Take 50 mg by mouth daily as needed for moderate pain or severe pain.) 90 tablet 1 Past Week   finasteride (PROSCAR) 5 MG tablet Take 1 tablet (5 mg total) by mouth daily. (Patient not taking: Reported on 03/10/2023) 90 tablet 1 Not Taking     Vitals   Vitals:   03/10/23 2028 03/11/23 0000 03/11/23 0400 03/11/23 0800  BP:  (!) 154/70 (!) 156/82 132/71  Pulse:  68 77 65  Resp:  11 19 15   Temp: 98.2 F (36.8 C) 98 F (36.7 C) 97.9 F (36.6 C) 98.5 F (36.9 C)  TempSrc: Oral Oral Oral Oral  SpO2:  94% 97% 95%  Weight:      Height:         Body mass index is 24.94 kg/m.  Physical Exam   General: sitting up right in recliner, no acute distress HENT: Normal oropharynx and mucosa. Normal external appearance of ears and nose.  Neck: Supple, no pain or tenderness  CV: RRR. No peripheral edema.  Pulmonary: Symmetric Chest rise. Normal respiratory effort.  Abdomen: Soft to touch, non-tender.  Ext: No cyanosis, edema, or deformity  Skin: No rash. Normal palpation of skin.   Musculoskeletal: Normal bulk and tone  Neurologic Examination  Mental status/Cognition: alert and oriented x 3, able to follow commands and conversation Speech/language: Fluent, comprehension intact Cranial nerves:   CN II Pupils equal and reactive to light   CN III,IV,VI EOM intact, rightward beating horizontal nystagmus   CN V normal sensation in V1, V2, and V3 segments bilaterally    CN VII Right sided facial droop, no nasolabial fold flattening    CN VIII Normal hearing   CN IX & X normal palatal elevation, no uvular deviation    CN XI 5/5 head turn and 5/5 shoulder shrug bilaterally    CN XII midline tongue protrusion   HINTS exam with horizontal rightward beating nystagmus. Head impulse and  skew normal. Rotary nystagmus with dix-hallpike maneuver and recurrence of  symptoms.   Motor:  Muscle bulk: normal bulk and tone.  Bilateral upper extremity flexion and extension 5/5  Bilateral lower extremity flexion and extension 5/5 Bilateral dorsiflexion and planter flexion 5/5   Reflexes: Patellar reflex 2+ bilaterally  Sensation: Consistent sensation in bilateral upper and lower extremities   Coordination/Complex Motor:  Finger to nose normal, absent pronator drift  Labs   CBC:  Recent Labs  Lab 03/09/23 1049 03/11/23 0414  WBC 6.1 6.7  NEUTROABS 3.8 4.5  HGB 14.4 13.0  HCT 43.8 39.4  MCV 96.7 96.3  PLT 170 154    Basic Metabolic Panel:  Lab Results  Component Value Date   NA 137 03/11/2023   K 4.4 03/11/2023   CO2 25 03/11/2023   GLUCOSE 90 03/11/2023   BUN 20 03/11/2023   CREATININE 1.60 (H) 03/11/2023   CALCIUM 8.6 (L) 03/11/2023   GFRNONAA 42 (L) 03/11/2023   GFRAA 48 (L) 04/27/2020   Lipid Panel:  Lab Results  Component Value Date   LDLCALC 79 03/10/2023   HgbA1c:  Lab Results  Component Value Date   HGBA1C 5.9 (H) 03/09/2023   CT angio Head and Neck with contrast: No acute head CT finding. 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 Mild chronic small-vessel ischemic change of the white matter, less than often seen at this age. No intracranial large vessel occlusion or proximal stenosis. Aortic atherosclerosis. Mild atherosclerotic change at both carotid bifurcations but no stenosis. Mild atherosclerotic change of the vertebral arteries but no flow limiting stenosis. Today's study does not show an explanation for the clinical presentation.  MRI Brain  No acute intracranial abnormality Chronic small vessel ischemia   Impression   History, physical exam, imaging, and patient having improvement with epley maneuver consistent with peripheral vertigo, likely BPPV. HINTS test and MRI reassuring that this is not a central process. With negative head impulse test vestibular neuritis less likely. Possibly  exacerbated with irrigation of right cerumen impaction.   Recommendations   -continue vestibular rehab and epley maneuver  -continue meclizine PRN, can give before PT sessions if helpful ______________________________________________________________________   Thank you for the opportunity to take part in the care of this patient. If you have any further questions, please contact the neurology consultation attending.  Signed,  Thalia Bloodgood DO  Internal Medicine Resident PGY-3 [place]  Pager: [phone]

## 2023-03-11 NOTE — Progress Notes (Addendum)
PROGRESS NOTE    Alex Dunn  GNF:[id number] DOB: [birth year] DOA: 03/09/2023 PCP: Sharlene Dory, DO    Chief Complaint  Patient presents with   Near Syncope   Ear Fullness    Brief Narrative:   Alex Dunn is a 87 y.o. male with medical history significant of BPH, HTN, MGUS, stage 3b CKD, and NSVT on Flecainide presenting with vertigo.   He reports that he awoke about 0430 and was profoundly dizzy.  He could not sit up, the room was spinning.  He couldn't focus his eyes.  He laid down and moved around with some improvement.  It improved enough that he was able to get up.  He still felt dizzy and it is ongoing.  No prior history of similar.  About 5-6 years ago, he was having night sweats and flu-like symptoms x 4-5 hours and then would resolve.  It became more occasional and a doctor was not able to figure it out.  He had one of those episodes Friday night, felt flu-like x 5-6 hours.  He felt better during the day Saturday but was still fatigued.  His last prior episode was 4-5 months ago.  Recently, he has been having episodes with standing like he was going to black out.  This often happens after working outside and his BP would be really low, in the 20s.  He talked to his heart doctor and recommended against working in the yard.  Once the BP normalizes he feels better.  With his episode overnight, he didn't notice any other neurologic symptoms.  He got a spinal cord stimulator about 6 weeks ago.  The stimulator works well for his neuropathy and controls the pain.  His orthostatic episodes preceded the stimulator.       ER Course:  [facility] transfer, per Dr. Chipper Herb:   Sudden onset of vertigo this morning with significant ataxia and mild right facial droop. CT head and CTA negative. Patient had a spinal stimulator inserted <6 wks ago ED at [place] can not determine whether MRI will be safe. Neurology Dr. Georg Ruddle at [facility] aware.   Assessment & Plan:   Principal  Problem:   Vertigo Active Problems:   BPH (benign prostatic hyperplasia)   Night sweats   Essential hypertension   Nonsustained ventricular tachycardia (HCC)   Stage 3b chronic kidney disease (HCC)   Neuropathic pain   Dyslipidemia   Orthostatic hypotension   DNR (do not resuscitate)     Vertigo -Patient with episodes of flu-like illness for several years, Had one such episode Friday and was still recovering -Patient presents with severe vertigo, new right facial droop, headache, concern for acute CVA, MRI has been obtained(it was delayed secondary to presence of spinal stimulator, which needed to be discharged before we could proceed with MRI), MRI brain with no evidence of acute ischemic event. -Exam and findings were significant for benign positional vertigo, possible vestibulitis as he reported recent viral illness. -He has been followed by vestibular PT daily, please maneuver has been attempted today with little of improvement. -Patient significantly unsteady with vestibular PT today, with minimal improvement with Epley's maneuver, unfortunately appears to be having persistent severe vertigo, so far has been resistant to current therapy, so I have consulted neurology for further recommendations.   HTN -Resume home dose losartan   HLD -LDL 79, continue with Lipitor at increased dose 40 mg daily      Flu-like illness episodes with night sweats -He describes  episodes lasting 5-6 hours that result in myalgias, chills, night sweats, and generalized misery -After the episodes, he continues to feel some lingering fatigue -Appears to have resolved currently  Orthostatic hypotensive episodes -He describes episodes of what sounds like orthostatic hypotension -This occurs mainly after working in the yard (likely hypovolemic) or when rising from a seated position -He did not tolerate tamsulosin due to worsening orthostasis -Will continue with IV fluids at 50 cc/h.  Peripheral  neuropathy -Recent (01/24/23) spinal cord stimulator placement at [facility] -He has had a good response with decreased neuropathic pain -Other episodes preceded stimulator placement and he and his wife think these are unrelated -Continue prn Tramadol   SVT -Continue flecainide -Currently in NSR    Stage 3b CKD -Appears to be stable at this time -Attempt to avoid nephrotoxic medications -Recheck BMP in AM    BPH -Continue finasteride      DVT prophylaxis: Lovenox Code Status: DNR Family Communication: wife at bedside Disposition: Still awaiting safe disposition plan, unfortunately he responds with significant vertigo prohibit him from walking, still needs intensive daily PT, neurology to evaluate today regarding further recommendations.     Consultants:  neurology   Subjective:  Patient still reports generalized weakness, fatigue, he had very unsteady gait when worked with vestibular PT today.  He reported increased dizziness when turning to the right side this morning as well, he was ambulated with PT multiple times today, with unsteady gait.  Objective: Vitals:   03/10/23 2028 03/11/23 0000 03/11/23 0400 03/11/23 0800  BP:  (!) 154/70 (!) 156/82 132/71  Pulse:  68 77 65  Resp:  11 19 15   Temp: 98.2 F (36.8 C) 98 F (36.7 C) 97.9 F (36.6 C) 98.5 F (36.9 C)  TempSrc: Oral Oral Oral Oral  SpO2:  94% 97% 95%  Weight:      Height:        Intake/Output Summary (Last 24 hours) at 03/11/2023 1325 Last data filed at 03/11/2023 0400 Gross per 24 hour  Intake 769.97 ml  Output 750 ml  Net 19.97 ml   Filed Weights   03/09/23 1021 03/09/23 1700  Weight: 87.5 kg 83.4 kg    Examination:  Awake Alert, Oriented X 3, No new F.N deficits, Normal affect, I have tried to ambulate patient today with PT present, with very unsteady gait Symmetrical Chest wall movement, Good air movement bilaterally, CTAB RRR,No Gallops,Rubs or new Murmurs, No Parasternal Heave +ve B.Sounds, Abd  Soft, No tenderness, No rebound - guarding or rigidity. No Cyanosis, Clubbing or edema, No new Rash or bruise       Data Reviewed: I have personally reviewed following labs and imaging studies  CBC: Recent Labs  Lab 03/09/23 1049 03/11/23 0414  WBC 6.1 6.7  NEUTROABS 3.8 4.5  HGB 14.4 13.0  HCT 43.8 39.4  MCV 96.7 96.3  PLT 170 154    Basic Metabolic Panel: Recent Labs  Lab 03/09/23 1049 03/11/23 0414  NA 138 137  K 4.5 4.4  CL 104 107  CO2 26 25  GLUCOSE 102* 90  BUN 23 20  CREATININE 1.59* 1.60*  CALCIUM 8.8* 8.6*  MG  --  2.1  PHOS  --  3.7    GFR: Estimated Creatinine Clearance: 37.7 mL/min (A) (by C-G formula based on SCr of 1.6 mg/dL (H)).  Liver Function Tests: No results for input(s): "AST", "ALT", "ALKPHOS", "BILITOT", "PROT", "ALBUMIN" in the last 168 hours.  CBG: No results for input(s): "GLUCAP" in the  last 168 hours.   No results found for this or any previous visit (from the past 240 hour(s)).       Radiology Studies: MR BRAIN WO CONTRAST  Result Date: 03/10/2023 CLINICAL DATA:  Stroke follow-up EXAM: MRI HEAD WITHOUT CONTRAST TECHNIQUE: Multiplanar, multiecho pulse sequences of the brain and surrounding structures were obtained without intravenous contrast. COMPARISON:  None Available. FINDINGS: Brain: No acute infarct, mass effect or extra-axial collection. No acute or chronic hemorrhage. There is multifocal hyperintense T2-weighted signal within the white matter. Parenchymal volume and CSF spaces are normal. The midline structures are normal. Vascular: Major flow voids are preserved. Skull and upper cervical spine: Normal calvarium and skull base. Visualized upper cervical spine and soft tissues are normal. Sinuses/Orbits:No paranasal sinus fluid levels or advanced mucosal thickening. No mastoid or middle ear effusion. Normal orbits. IMPRESSION: 1. No acute intracranial abnormality. 2. Findings of chronic small vessel ischemia. Electronically  Signed   By: Deatra Robinson M.D.   On: 03/10/2023 23:42   DG Abd 1 View  Result Date: 03/09/2023 CLINICAL DATA:  [id number] Vertigo [id number] EXAM: ABDOMEN - 1 VIEW COMPARISON:  January 24, 2023. FINDINGS: Incomplete visualization of the lateral soft tissues. There is a spinal cord stimulator with leads projecting over the level of L1. Excreted contrast in the bladder. No dilated loops of bowel are seen. IMPRESSION: Spinal cord stimulator leads projecting over the level of L1. Electronically Signed   By: Meda Klinefelter M.D.   On: 03/09/2023 21:00        Scheduled Meds:  aspirin EC  81 mg Oral Daily   atorvastatin  40 mg Oral Daily   enoxaparin (LOVENOX) injection  40 mg Subcutaneous Q24H   feeding supplement  237 mL Oral BID BM   finasteride  5 mg Oral Daily   flecainide  50 mg Oral BID   multivitamin with minerals  1 tablet Oral Daily   Continuous Infusions:  sodium chloride 50 mL/hr at 03/10/23 1723     LOS: 0 days       Alex Bienenstock, Alex Dunn [facility]   To contact the attending provider between 7A-7P or the covering provider during after hours 7P-7A, please log into the web site [email or web address] and access using universal [place] password for that web site. If you do not have the password, please call the hospital operator.  03/11/2023, 1:25 PM

## 2023-03-11 NOTE — Progress Notes (Signed)
SLP Cancellation Note  Patient Details Name: Alex Dunn MRN: [id number] DOB: April 26, 1936   Cancelled treatment:       Reason Eval/Treat Not Completed: SLP screened, no needs identified, will sign off   SLP consult received and appreciated. Chart review completed. Imaging not indicative of stroke. Per RN, no observed cognitive-linguistic deficits. Pt working with PT upon SLP entrance to room. Per screening, pt A&Ox4. Speech is fluent, appropriate, and without dysarthric features. Wife noted pt "slow" to respond at times. Pt endorsed this co-occurs with dizziness. Not appreciated this date. SLP to sign off. Pt and wife made aware and in agreement with the above. Please reconsult should pt demonstrate any new difficulty with speech/language/cognition.  [name], M.S., CCC-SLP Speech-Language Pathologist Secure Chat Preferred  O: [phone]  Alex Dunn 03/11/2023, 8:55 AM

## 2023-03-12 DIAGNOSIS — I10 Essential (primary) hypertension: Secondary | ICD-10-CM | POA: Diagnosis not present

## 2023-03-12 DIAGNOSIS — I4729 Other ventricular tachycardia: Secondary | ICD-10-CM | POA: Diagnosis not present

## 2023-03-12 DIAGNOSIS — N1832 Chronic kidney disease, stage 3b: Secondary | ICD-10-CM | POA: Diagnosis not present

## 2023-03-12 DIAGNOSIS — R42 Dizziness and giddiness: Secondary | ICD-10-CM | POA: Diagnosis not present

## 2023-03-12 NOTE — Progress Notes (Signed)
Physical Therapy Treatment Patient Details Name: Alex Dunn MRN: [id number] DOB: 1936-07-06 Today's Date: 03/12/2023   History of Present Illness Pt ia an 87 year old man admitted with severe dizziness. Head CT negative for acute changes, MRI negative. PMH: spinal cord stimulator x 6 weeks, peripheral neuropathy, BPH, HTN, CKD 3, HLD, orthostatic hypotension, tachycardia.    PT Comments    Pt tolerates treatment well, reports 3/10 dizziness with epley this afternoon, with quicker resolution of symptoms upon completion. Pt is able to ambulate for household distances, utilizing a rollator this session per pt's request. PT and pt in agreement at end of session the front wheeled RW will lead to improved stability at this time, and is a preferable device to utilize while pt continues to experience dizziness. Pt is also able to negotiate 3 steps, simulating entrance to his home. Pt and spouse express a desire to return home rather than pursue inpatient PT services. PT will continue to follow during this admission.   Recommendations for follow up therapy are one component of a multi-disciplinary discharge planning process, led by the attending physician.  Recommendations may be updated based on patient status, additional functional criteria and insurance authorization.  Follow Up Recommendations  Can patient physically be transported by private vehicle: Yes    Assistance Recommended at Discharge Intermittent Supervision/Assistance  Patient can return home with the following A little help with walking and/or transfers;A little help with bathing/dressing/bathroom;Assistance with cooking/housework;Assist for transportation;Help with stairs or ramp for entrance   Equipment Recommendations  Rolling walker (2 wheels);BSC/3in1    Recommendations for Other Services       Precautions / Restrictions Precautions Precautions: Fall Restrictions Weight Bearing Restrictions: No     Mobility  Bed  Mobility Overal bed mobility: Needs Assistance Bed Mobility: Sit to Supine, Sidelying to Sit, Rolling Rolling: Modified independent (Device/Increase time) Sidelying to sit: Min guard   Sit to supine: Modified independent (Device/Increase time) Sit to sidelying: Supervision General bed mobility comments: assist required for quick movements in epley and semont maneuver, otherwise can perform at supervision level with increased time    Transfers Overall transfer level: Needs assistance Equipment used: Rollator (4 wheels), None Transfers: Sit to/from Stand, Bed to chair/wheelchair/BSC Sit to Stand: Min guard Stand pivot transfers: Min guard         General transfer comment: pt performs sit to stand x2 with rollator, practices sitting on rollator once for brake use. Pt later performs SPT x2 without DME, PT assist for safety. PT provides reinforcement of need for walker for all transfers at the time of discharge, this being an exception due to presence of PT    Ambulation/Gait Ambulation/Gait assistance: Min guard Gait Distance (Feet): 200 Feet Assistive device: Rollator (4 wheels) Gait Pattern/deviations: Step-through pattern Gait velocity: reduced Gait velocity interpretation: <1.8 ft/sec, indicate of risk for recurrent falls   General Gait Details: slowed step-through gait, no significant balance deviations noted   Stairs Stairs: Yes Stairs assistance: Min guard Stair Management: One rail Right, Step to pattern, Sideways Number of Stairs: 3     Wheelchair Mobility    Modified Rankin (Stroke Patients Only)       Balance Overall balance assessment: Needs assistance Sitting-balance support: No upper extremity supported, Feet supported Sitting balance-Leahy Scale: Good     Standing balance support: Single extremity supported, Reliant on assistive device for balance Standing balance-Leahy Scale: Poor  Cognition  Arousal/Alertness: Awake/alert Behavior During Therapy: WFL for tasks assessed/performed Overall Cognitive Status: Within Functional Limits for tasks assessed                                          Exercises Other Exercises Other Exercises: PT encourages continue performance of VOR x1    General Comments General comments (skin integrity, edema, etc.): VSS on RA, pt performs epley with PT assist, pt reports 3/10 dizziness with maneuver this session, continues to show nystagmus with initial mobility but symptoms appear less significant than this morning      Pertinent Vitals/Pain Pain Assessment Pain Assessment: No/denies pain Faces Pain Scale: No hurt    Home Living                          Prior Function            PT Goals (current goals can now be found in the care plan section) Acute Rehab PT Goals Patient Stated Goal: to stop dizziness, return to baseline Progress towards PT goals: Progressing toward goals    Frequency    Min 5X/week      PT Plan Discharge plan needs to be updated    Co-evaluation              AM-PAC PT "6 Clicks" Mobility   Outcome Measure  Help needed turning from your back to your side while in a flat bed without using bedrails?: None Help needed moving from lying on your back to sitting on the side of a flat bed without using bedrails?: A Little Help needed moving to and from a bed to a chair (including a wheelchair)?: A Little Help needed standing up from a chair using your arms (e.g., wheelchair or bedside chair)?: A Little Help needed to walk in hospital room?: A Little Help needed climbing 3-5 steps with a railing? : A Little 6 Click Score: 19    End of Session Equipment Utilized During Treatment: Gait belt Activity Tolerance: Patient tolerated treatment well Patient left: in chair;with call bell/phone within reach;with chair alarm set;with family/visitor present Nurse Communication: Mobility  status PT Visit Diagnosis: BPPV;Dizziness and giddiness (R42) BPPV - Right/Left : Right     Time: 1610-9604 PT Time Calculation (min) (ACUTE ONLY): 32 min  Charges:  $Gait Training: 8-22 mins $Canalith Rep Proc: 8-22 mins                     Arlyss Gandy, PT, DPT Acute Rehabilitation Office [phone]    Arlyss Gandy 03/12/2023, 3:02 PM

## 2023-03-12 NOTE — TOC Initial Note (Signed)
Transition of Care [facility]) - Initial/Assessment Note    Patient Details  Name: Alex Dunn MRN: [id number] Date of Birth: 1936-02-20  Transition of Care [facility]) CM/SW Contact:    Mearl Latin, LCSW Phone Number: 03/12/2023, 2:25 PM  Clinical Narrative:                 CSW received consult for possible SNF placement at time of discharge. CSW spoke with patient and spouse at bedside. Patient reported that patient's spouse is currently unable to care for patient at their home given patient's current physical needs and fall risk. Patient expressed understanding of PT recommendation and may be agreeable to SNF placement at time of discharge. Patient reports preference for [facility] if they have a private room or [facility]. CSW discussed insurance authorization process and will provide Medicare SNF ratings list. CSW will send out referrals for review and provide bed offers as available. Patient stated they will call CSW back in a few minutes after discussing the plan.   Update 2:20pm-CSW received call from spouse stating they have decided to return home at discharge with home health services. Patient is unaware of agencies and would like one with high ratings in network with his insurance. CSW enlisted assistance of RNCM to order DME and HH. Patient requesting a 3in1 bedside commode and either a front wheeled walker or rollator. Physical therapy to let CSW know which is their recommendation.   [facility] has accepted patient for services. Will obtain orders from MD.   Skilled Nursing Rehab Facilities-   [email or web address]   Ratings out of 5 stars (5 the highest)   Name Address  Phone # Quality Care Staffing Health Inspection Overall  [facility] [address] [phone] 2 1 5 4   [facility] [address], [place] [phone] 4 1 3 2   [facility] [address], [facility] [phone] [facility] [address] [place] [phone] 4 1 3 2   [facility]  [address], [place] [phone] 3 2 5 5   [facility] [address] [facility] [phone] 2 1 2 1   [facility] [address], [place] [phone] 4 1 2 1   [facility] [address] [place] [phone] 2 4 3 3   [address] ([facility]) [address], [place] [phone] 3 2 2 2   [facility] [address] [place], [place] [phone] 1 2 1 1   [facility] ([place]) [address], [place] [phone] 3 1 1 1   [facility] [address] [place] [phone] 4 3 4 4   [facility] [address] [place] [phone] 3 4 3 3           [facility] [address] [place] [phone]      [facility] [place] [address], [place] [phone] 1 1 2 1   [facility] [address] [place] [phone] 2 2 4 4   [facility] [place] [address], [place] [phone] 2 1 4 3   [facility] [address], [place] [phone] 3 3 3 3           [address] (no [facility]) [address], [place] [phone] 4 4 5 5   [facility] (No [employer]) [address] [place] [phone] 2 2 4 4   [facility] [address] [place] [phone] 2 1 2 1   [facility] (No [facility]) [address], [place] [phone] 5 5 5 5   [facility] [address], [facility] [phone] 2 3 5 5   [facility] [address] [place] [phone] 2 1 1 1   [facility] [address] [place] [phone] 5 2 5 5   [facility]  [address] [place] [phone] 2 2 2 2   [facility] [address], [place] [phone] 4 2 1 1   [facility] [address] [place], [place] [phone] 2 2 3 3           [facility] [address], [place] [phone] 1 1 1 1   [facility] [address], [place] Clines  [phone] 2 3 3 3   [facility]  (No Humana) [address], [place] [phone] 2 1 3 2   [facility]) [address], [place] [phone] 1 1 1 1   [facility] [address], [place] [phone] 3 2 5 5   [facility] [address], [place] [phone] 2 1 1 1           [facility] [address] [place], [place] [phone] 4 4 5 5   [facility])  [address], [place] [phone] 2 1 2 1   [facility]) [address] [place], [place] [phone]  1 4 3   [facility] [address] [place] [phone] 3 5 4 5   [address] [place], [place] [phone] 3 2 2 2   [facility]) [address] [place] [phone] 2 1 3 2                Expected Discharge Plan: Home w Home Health Services Barriers to Discharge: Continued Medical Work up   Patient Goals and CMS Choice Patient states their goals for this hospitalization and ongoing recovery are:: Return home CMS Medicare.gov Compare Post Acute Care list provided to:: Patient Choice offered to / list presented to : Patient [place] ownership interest in [facility].provided to:: Patient    Expected Discharge Plan and Services In-house Referral: Clinical Social Work Discharge Planning Services: CM Consult Post Acute Care Choice: Home Health, Durable Medical Equipment Living arrangements for the past 2 months: Single Family Home                           HH Arranged: PT, OT HH Agency: [facility] Date [facility] Agency Contacted: 03/12/23 Time HH Agency Contacted: 1425 Representative spoke with at [facility] Agency: Denyse Amass  Prior Living Arrangements/Services Living arrangements for the past 2 months: Single Family Home Lives with:: Spouse Patient language and need for interpreter reviewed:: Yes Do you feel safe going back to the place where you live?: Yes      Need for Family Participation in Patient  Care: Yes (Comment) Care giver support system in place?: Yes (comment) Current home services: DME ([facility] out of pocket) Criminal Activity/Legal Involvement Pertinent to Current Situation/Hospitalization: No - Comment as needed  Activities of Daily Living Home Assistive Devices/Equipment: None ADL Screening (condition at time of admission) Patient's cognitive ability adequate to safely complete daily activities?: Yes Is the patient deaf or have difficulty hearing?: No Does the patient have difficulty seeing, even when wearing glasses/contacts?: No Does the patient have difficulty concentrating, remembering, or making decisions?: No Patient able to express need for assistance with ADLs?: Yes Does the patient have difficulty dressing or bathing?: No Independently performs ADLs?: Yes (appropriate for developmental age) Does the  patient have difficulty walking or climbing stairs?: No Weakness of Legs: None Weakness of Arms/Hands: None  Permission Sought/Granted Permission sought to share information with : Facility Medical sales representative, Family Supports Permission granted to share information with : Yes, Verbal Permission Granted  Share Information with NAME: Nicole Cella  Permission granted to share info w AGENCY: SNFs/HH  Permission granted to share info w Relationship: Spouse  Permission granted to share info w Contact Information: [phone]  Emotional Assessment Appearance:: Appears stated age Attitude/Demeanor/Rapport: Engaged Affect (typically observed): Accepting, Appropriate, Pleasant Orientation: : Oriented to Self, Oriented to Place, Oriented to  Time, Oriented to Situation Alcohol / Substance Use: Not Applicable Psych Involvement: No (comment)  Admission diagnosis:  TIA (transient ischemic attack) [G45.9] Vertigo [R42] Patient Active Problem List   Diagnosis Date Noted   TIA (transient ischemic attack) 03/11/2023   Vertigo 03/09/2023   Dyslipidemia 03/09/2023    Orthostatic hypotension 03/09/2023   DNR (do not resuscitate) 03/09/2023   Cerebellar ataxia in diseases classified elsewhere (HCC) 12/07/2021   CIDP (chronic inflammatory demyelinating polyneuropathy) (HCC) 12/07/2021   History of CVA (cerebrovascular accident) 09/14/2021   Neuropathic pain 06/05/2021   PVC's (premature ventricular contractions) 03/01/2021   Polycystic kidney disease    MGUS (monoclonal gammopathy of unknown significance)    Diverticulosis    Stage 3b chronic kidney disease (HCC) 06/21/2020   B12 deficiency 06/13/2020   Ataxia 02/14/2020   Bilateral leg pain 02/14/2020   Weakness of both lower extremities 01/10/2020   Balance problem 01/10/2020   Nonsustained ventricular tachycardia (HCC) 11/03/2019   Lumbar radiculopathy 05/06/2019   Lumbosacral spondylosis without myelopathy 03/10/2019   Low back pain 03/09/2019   Monoclonal gammopathy of unknown significance (MGUS) 08/24/2018   Primary osteoarthritis of both knees 06/30/2018   Degeneration of lumbar intervertebral disc 06/30/2018   Essential hypertension 04/15/2018   Diverticular disease 04/15/2018   Abdominal aortic atherosclerosis (HCC) 04/15/2018   Elevated diaphragm 04/15/2018   Multiple renal cysts 04/06/2018   Night sweats 03/13/2018   Weakness 03/13/2018   Chronic kidney disease 10/18/2016   ANEMIA-IRON DEFICIENCY 06/23/2007   Gastro-esophageal reflux disease without esophagitis 06/23/2007   BPH (benign prostatic hyperplasia) 06/23/2007   GERD 06/23/2007   PCP:  Sharlene Dory, DO Pharmacy:   [facility]) ELECTRONIC [place], [place] - [address] [place] [address] Phone: [phone] Fax: [phone]  [id number] - [place], [place] - [address] AT [facility] [address] [facility] [address] [place] [address] Phone: [phone] Fax: [phone]     Social Determinants of Health (SDOH) Social  History: SDOH Screenings   Food Insecurity: No Food Insecurity (01/08/2022)  Housing: Low Risk  (01/08/2022)  Transportation Needs: No Transportation Needs (01/08/2022)  Depression (PHQ2-9): Low Risk  (01/14/2023)  Financial Resource Strain: Low Risk  (01/08/2022)  Physical Activity: Inactive (01/08/2022)  Social Connections: Moderately Integrated (01/08/2022)  Stress: No Stress Concern Present (01/08/2022)  Tobacco Use: Low Risk  (03/09/2023)   SDOH Interventions:     Readmission Risk Interventions     No data to display

## 2023-03-12 NOTE — Progress Notes (Addendum)
PROGRESS NOTE        PATIENT DETAILS Name: Alex Dunn Age: 87 y.o. Sex: male Date of Birth: 03-19-1936 Admit Date: 03/09/2023 Admitting Physician Emeline General, MD ZOX:[id number], Jilda Roche, DO  Brief Summary: Patient is a 87 y.o.  male with history of HTN, CKD stage IIIb, PVC/NSVT on flecainide presented with vertigo  Significant events: 5/5>> admit to [facility]  Significant studies: 5/5>> CT angio head/neck: No significant stenosis/LVO. 5/6>> MRI brain: No acute CVA  Significant microbiology data: None  Procedures: None  Consults: Neurology  Subjective: Still with vertigo-especially with moving his head to the right side.  Almost fell when he ambulated yesterday afternoon.  Objective: Vitals: Blood pressure 137/68, pulse 72, temperature 98.5 F (36.9 C), temperature source Oral, resp. rate 18, height 6' (1.829 m), weight 83.4 kg, SpO2 97 %.   Exam: Gen Exam:Alert awake-not in any distress HEENT:atraumatic, normocephalic Chest: B/L clear to auscultation anteriorly CVS:S1S2 regular Abdomen:soft non tender, non distended Extremities:no edema Neurology: Non focal Skin: no rash  Pertinent Labs/Radiology:    Latest Ref Rng & Units 03/11/2023    4:14 AM 03/09/2023   10:49 AM 02/24/2023    2:28 PM  CBC  WBC 4.0 - 10.5 K/uL 6.7  6.1  5.9   Hemoglobin 13.0 - 17.0 g/dL 98.1  19.1  47.8   Hematocrit 39.0 - 52.0 % 39.4  43.8  40.3   Platelets 150 - 400 K/uL 154  170  166     Lab Results  Component Value Date   NA 137 03/11/2023   K 4.4 03/11/2023   CL 107 03/11/2023   CO2 25 03/11/2023      Assessment/Plan: Vertigo likely due to BPPV Unfortunately continues to have persistent symptoms-although better but still preventing patient from ambulating safely Continue meclizine-continue Epley's/Semont maneuver with vestibular PT. Suspect will require SNF  IgA kappa MGUS  Follows with Dr Myna Hidalgo  HTN BP  stable Losartan  HLD Statin  Peripheral neuropathy Spinal cord stimulator in place (placed 01/24/2023 at [facility] Continue as needed tramadol  History of PVCs/NSVT Flecainide Telemetry monitoring  CKD stage IIIb At baseline  BPH Finasteride  Nutrition Status: Nutrition Problem: Inadequate oral intake Etiology: decreased appetite Signs/Symptoms: per patient/family report Interventions: Ensure Enlive (each supplement provides 350kcal and 20 grams of protein), MVI, Liberalize Diet  BMI: Estimated body mass index is 24.94 kg/m as calculated from the following:   Height as of this encounter: 6' (1.829 m).   Weight as of this encounter: 83.4 kg.   Code status:   Code Status: DNR   DVT Prophylaxis: enoxaparin (LOVENOX) injection 40 mg Start: 03/09/23 2200   Family Communication: None at bedside   Disposition Plan: Status is: Inpatient Remains inpatient appropriate because: Severity of illness   Planned Discharge Destination:Skilled nursing facility   Diet: Diet Order             Diet regular Room service appropriate? Yes with Assist; Fluid consistency: Thin  Diet effective now                     Antimicrobial agents: Anti-infectives (From admission, onward)    None        MEDICATIONS: Scheduled Meds:  aspirin EC  81 mg Oral Daily   atorvastatin  40 mg Oral Daily   enoxaparin (LOVENOX) injection  40 mg  Subcutaneous Q24H   feeding supplement  237 mL Oral BID BM   finasteride  5 mg Oral Daily   flecainide  50 mg Oral BID   losartan  50 mg Oral Daily   multivitamin with minerals  1 tablet Oral Daily   Continuous Infusions:  sodium chloride 50 mL/hr at 03/11/23 1507   PRN Meds:.acetaminophen, hydrALAZINE, meclizine, ondansetron (ZOFRAN) IV, pantoprazole, senna-docusate, traMADol   I have personally reviewed following labs and imaging studies  LABORATORY DATA: CBC: Recent Labs  Lab 03/09/23 1049 03/11/23 0414  WBC 6.1 6.7  NEUTROABS 3.8  4.5  HGB 14.4 13.0  HCT 43.8 39.4  MCV 96.7 96.3  PLT 170 154    Basic Metabolic Panel: Recent Labs  Lab 03/09/23 1049 03/11/23 0414  NA 138 137  K 4.5 4.4  CL 104 107  CO2 26 25  GLUCOSE 102* 90  BUN 23 20  CREATININE 1.59* 1.60*  CALCIUM 8.8* 8.6*  MG  --  2.1  PHOS  --  3.7    GFR: Estimated Creatinine Clearance: 37.7 mL/min (A) (by C-G formula based on SCr of 1.6 mg/dL (H)).  Liver Function Tests: No results for input(s): "AST", "ALT", "ALKPHOS", "BILITOT", "PROT", "ALBUMIN" in the last 168 hours. No results for input(s): "LIPASE", "AMYLASE" in the last 168 hours. No results for input(s): "AMMONIA" in the last 168 hours.  Coagulation Profile: No results for input(s): "INR", "PROTIME" in the last 168 hours.  Cardiac Enzymes: No results for input(s): "CKTOTAL", "CKMB", "CKMBINDEX", "TROPONINI" in the last 168 hours.  BNP (last 3 results) No results for input(s): "PROBNP" in the last 8760 hours.  Lipid Profile: Recent Labs    03/10/23 0318  CHOL 123  HDL 29*  LDLCALC 79  TRIG 73  CHOLHDL 4.2    Thyroid Function Tests: No results for input(s): "TSH", "T4TOTAL", "FREET4", "T3FREE", "THYROIDAB" in the last 72 hours.  Anemia Panel: No results for input(s): "VITAMINB12", "FOLATE", "FERRITIN", "TIBC", "IRON", "RETICCTPCT" in the last 72 hours.  Urine analysis:    Component Value Date/Time   COLORURINE YELLOW 11/11/2021 1058   APPEARANCEUR CLEAR 11/11/2021 1058   LABSPEC 1.015 11/11/2021 1058   PHURINE 6.0 11/11/2021 1058   GLUCOSEU NEGATIVE 11/11/2021 1058   HGBUR NEGATIVE 11/11/2021 1058   BILIRUBINUR NEGATIVE 11/11/2021 1058   BILIRUBINUR n 10/11/2016 1041   KETONESUR NEGATIVE 11/11/2021 1058   PROTEINUR NEGATIVE 11/11/2021 1058   UROBILINOGEN 0.2 10/11/2016 1041   UROBILINOGEN 0.2 06/15/2009 0958   NITRITE NEGATIVE 11/11/2021 1058   LEUKOCYTESUR NEGATIVE 11/11/2021 1058    Sepsis Labs: Lactic Acid, Venous No results found for:  "LATICACIDVEN"  MICROBIOLOGY: No results found for this or any previous visit (from the past 240 hour(s)).  RADIOLOGY STUDIES/RESULTS: MR BRAIN WO CONTRAST  Result Date: 03/10/2023 CLINICAL DATA:  Stroke follow-up EXAM: MRI HEAD WITHOUT CONTRAST TECHNIQUE: Multiplanar, multiecho pulse sequences of the brain and surrounding structures were obtained without intravenous contrast. COMPARISON:  None Available. FINDINGS: Brain: No acute infarct, mass effect or extra-axial collection. No acute or chronic hemorrhage. There is multifocal hyperintense T2-weighted signal within the white matter. Parenchymal volume and CSF spaces are normal. The midline structures are normal. Vascular: Major flow voids are preserved. Skull and upper cervical spine: Normal calvarium and skull base. Visualized upper cervical spine and soft tissues are normal. Sinuses/Orbits:No paranasal sinus fluid levels or advanced mucosal thickening. No mastoid or middle ear effusion. Normal orbits. IMPRESSION: 1. No acute intracranial abnormality. 2. Findings of chronic small vessel ischemia.  Electronically Signed   By: Deatra Robinson M.D.   On: 03/10/2023 23:42     LOS: 1 day   Jeoffrey Massed, MD  [facility]    To contact the attending provider between 7A-7P or the covering provider during after hours 7P-7A, please log into the web site [email or web address] and access using universal [place] password for that web site. If you do not have the password, please call the hospital operator.  03/12/2023, 12:07 PM

## 2023-03-12 NOTE — Progress Notes (Signed)
Occupational Therapy Treatment Patient Details Name: Alex Dunn MRN: [id number] DOB: [birth year] Today's Date: 03/12/2023   History of present illness Pt ia an 87 year old man admitted with severe dizziness. Head CT negative for acute changes, MRI negative. PMH: spinal cord stimulator x 6 weeks, peripheral neuropathy, BPH, HTN, CKD 3, HLD, orthostatic hypotension, tachycardia.   OT comments  Focus of session on educating pt and wife in fall prevention once pt returns home. Pt endorses less dizziness. Using vision stabilization technique with ambulation with RW. Educated in benefits and multipurposes of 3 in 1. Instructed in compensatory strategies to prevent pt inverting head during ADLs. Pt's wife asking about RW vs rollator, risks and benefits of both reviewed and PT notified. Pt's family cannot offer physical assistance for mobility, updated discharge recommendation to inpatient follow up therapy, <3 hours/day.    Recommendations for follow up therapy are one component of a multi-disciplinary discharge planning process, led by the attending physician.  Recommendations may be updated based on patient status, additional functional criteria and insurance authorization.    Assistance Recommended at Discharge Frequent or constant Supervision/Assistance  Patient can return home with the following  A little help with walking and/or transfers;A little help with bathing/dressing/bathroom;Assistance with cooking/housework;Assist for transportation;Help with stairs or ramp for entrance   Equipment Recommendations  BSC/3in1    Recommendations for Other Services      Precautions / Restrictions Precautions Precautions: Fall Restrictions Weight Bearing Restrictions: No       Mobility Bed Mobility                    Transfers Overall transfer level: Needs assistance Equipment used: Rolling walker (2 wheels) Transfers: Sit to/from Stand Sit to Stand: Min guard                  Balance Overall balance assessment: Needs assistance Sitting-balance support: No upper extremity supported, Feet supported Sitting balance-Leahy Scale: Good     Standing balance support: Bilateral upper extremity supported Standing balance-Leahy Scale: Poor                             ADL either performed or assessed with clinical judgement   ADL Overall ADL's : Needs assistance/impaired                     Lower Body Dressing: Set up;Sitting/lateral leans   Toilet Transfer: Minimal assistance Toilet Transfer Details (indicate cue type and reason): educated in use of 3 in 1 over toilet                Extremity/Trunk Assessment              Vision       Perception     Praxis      Cognition Arousal/Alertness: Awake/alert Behavior During Therapy: WFL for tasks assessed/performed Overall Cognitive Status: Within Functional Limits for tasks assessed                                          Exercises      Shoulder Instructions       General Comments     Pertinent Vitals/ Pain       Pain Assessment Pain Assessment: No/denies pain  Home Living  Prior Functioning/Environment              Frequency  Min 2X/week        Progress Toward Goals  OT Goals(current goals can now be found in the care plan section)  Progress towards OT goals: Progressing toward goals  Acute Rehab OT Goals OT Goal Formulation: With patient Time For Goal Achievement: 03/24/23 Potential to Achieve Goals: Good  Plan Discharge plan needs to be updated    Co-evaluation                 AM-PAC OT "6 Clicks" Daily Activity     Outcome Measure   Help from another person eating meals?: None Help from another person taking care of personal grooming?: A Little Help from another person toileting, which includes using toliet, bedpan, or urinal?: A Little Help from  another person bathing (including washing, rinsing, drying)?: A Little Help from another person to put on and taking off regular upper body clothing?: A Little Help from another person to put on and taking off regular lower body clothing?: A Little 6 Click Score: 19    End of Session Equipment Utilized During Treatment: Rolling walker (2 wheels);Gait belt  OT Visit Diagnosis: Dizziness and giddiness (R42);Other abnormalities of gait and mobility (R26.89)   Activity Tolerance Patient tolerated treatment well   Patient Left in chair;with call bell/phone within reach;with family/visitor present   Nurse Communication          Time: 8295-6213 OT Time Calculation (min): 36 min  Charges: OT General Charges $OT Visit: 1 Visit OT Treatments $Self Care/Home Management : 8-22 mins  Berna Spare, OTR/L Acute Rehabilitation Services Office: [phone]   Evern Bio 03/12/2023, 12:34 PM

## 2023-03-12 NOTE — [place] FL2 (Signed)
[place] MEDICAID FL2 LEVEL OF CARE FORM     IDENTIFICATION  Patient Name: Alex Dunn Birthdate: November 06, 1935 Sex: male Admission Date (Current Location): 03/09/2023  [facility] and [place] Number:  Producer, television/film/video and Address:  [facility], [address], [place], [place] [address]      Provider Number: [id number]  Attending Physician Name and Address:  Maretta Bees, MD  Relative Name and Phone Number:       Current Level of Care: Hospital Recommended Level of Care: Skilled Nursing Facility Prior Approval Number:    Date Approved/Denied:   PASRR Number: [id number]  Discharge Plan: SNF    Current Diagnoses: Patient Active Problem List   Diagnosis Date Noted   TIA (transient ischemic attack) 03/11/2023   Vertigo 03/09/2023   Dyslipidemia 03/09/2023   Orthostatic hypotension 03/09/2023   DNR (do not resuscitate) 03/09/2023   Cerebellar ataxia in diseases classified elsewhere (HCC) 12/07/2021   CIDP (chronic inflammatory demyelinating polyneuropathy) (HCC) 12/07/2021   History of CVA (cerebrovascular accident) 09/14/2021   Neuropathic pain 06/05/2021   PVC's (premature ventricular contractions) 03/01/2021   Polycystic kidney disease    MGUS (monoclonal gammopathy of unknown significance)    Diverticulosis    Stage 3b chronic kidney disease (HCC) 06/21/2020   B12 deficiency 06/13/2020   Ataxia 02/14/2020   Bilateral leg pain 02/14/2020   Weakness of both lower extremities 01/10/2020   Balance problem 01/10/2020   Nonsustained ventricular tachycardia (HCC) 11/03/2019   Lumbar radiculopathy 05/06/2019   Lumbosacral spondylosis without myelopathy 03/10/2019   Low back pain 03/09/2019   Monoclonal gammopathy of unknown significance (MGUS) 08/24/2018   Primary osteoarthritis of both knees 06/30/2018   Degeneration of lumbar intervertebral disc 06/30/2018   Essential hypertension 04/15/2018   Diverticular disease 04/15/2018    Abdominal aortic atherosclerosis (HCC) 04/15/2018   Elevated diaphragm 04/15/2018   Multiple renal cysts 04/06/2018   Night sweats 03/13/2018   Weakness 03/13/2018   Chronic kidney disease 10/18/2016   ANEMIA-IRON DEFICIENCY 06/23/2007   Gastro-esophageal reflux disease without esophagitis 06/23/2007   BPH (benign prostatic hyperplasia) 06/23/2007   GERD 06/23/2007    Orientation RESPIRATION BLADDER Height & Weight     Self, Time, Situation, Place  Normal Continent Weight: 183 lb 13.8 oz (83.4 kg) Height:  6' (182.9 cm)  BEHAVIORAL SYMPTOMS/MOOD NEUROLOGICAL BOWEL NUTRITION STATUS      Continent Diet (See dc summary)  AMBULATORY STATUS COMMUNICATION OF NEEDS Skin   Limited Assist Verbally Normal                       Personal Care Assistance Level of Assistance  Bathing, Feeding, Dressing Bathing Assistance: Limited assistance Feeding assistance: Independent Dressing Assistance: Limited assistance     Functional Limitations Info  Sight Sight Info: Impaired        SPECIAL CARE FACTORS FREQUENCY  PT (By licensed PT), OT (By licensed OT)     PT Frequency: 5x/week OT Frequency: 5x/week            Contractures Contractures Info: Not present    Additional Factors Info  Code Status, Allergies Code Status Info: DNR Allergies Info: Amitriptyline, Codeine Phosphate, Pregabalin, Tamsulosin, Tape, Lexapro (Escitalopram Oxalate)           Current Medications (03/12/2023):  This is the current hospital active medication list Current Facility-Administered Medications  Medication Dose Route Frequency Provider Last Rate Last Admin   0.9 %  sodium chloride infusion  Intravenous Continuous Jonah Blue, MD 50 mL/hr at 03/11/23 1507 New Bag at 03/11/23 1507   acetaminophen (TYLENOL) tablet 650 mg  650 mg Oral QID PRN Jonah Blue, MD       aspirin EC tablet 81 mg  81 mg Oral Daily Jonah Blue, MD   81 mg at 03/12/23 9604   atorvastatin (LIPITOR) tablet 40 mg   40 mg Oral Daily Jonah Blue, MD   40 mg at 03/12/23 0922   enoxaparin (LOVENOX) injection 40 mg  40 mg Subcutaneous Q24H Jonah Blue, MD   40 mg at 03/11/23 2214   feeding supplement (ENSURE ENLIVE / ENSURE PLUS) liquid 237 mL  237 mL Oral BID BM Elgergawy, Leana Roe, MD   237 mL at 03/12/23 5409   finasteride (PROSCAR) tablet 5 mg  5 mg Oral Daily Jonah Blue, MD   5 mg at 03/12/23 8119   flecainide (TAMBOCOR) tablet 50 mg  50 mg Oral BID Jonah Blue, MD   50 mg at 03/12/23 1478   hydrALAZINE (APRESOLINE) injection 5 mg  5 mg Intravenous Q6H PRN Jonah Blue, MD       losartan (COZAAR) tablet 50 mg  50 mg Oral Daily Elgergawy, Leana Roe, MD   50 mg at 03/12/23 2956   meclizine (ANTIVERT) tablet 25 mg  25 mg Oral TID PRN Howerter, Justin B, DO   25 mg at 03/12/23 2130   multivitamin with minerals tablet 1 tablet  1 tablet Oral Daily Elgergawy, Leana Roe, MD   1 tablet at 03/12/23 0922   ondansetron [facility]) injection 4 mg  4 mg Intravenous Q6H PRN Howerter, Justin B, DO   4 mg at 03/10/23 0538   pantoprazole (PROTONIX) EC tablet 40 mg  40 mg Oral Daily PRN Jonah Blue, MD       senna-docusate (Senokot-S) tablet 1 tablet  1 tablet Oral QHS PRN Jonah Blue, MD   1 tablet at 03/11/23 8657   traMADol (ULTRAM) tablet 50 mg  50 mg Oral Q12H PRN Jonah Blue, MD   50 mg at 03/10/23 8469     Discharge Medications: Please see discharge summary for a list of discharge medications.  Relevant Imaging Results:  Relevant Lab Results:   Additional Information SSN: [id number]  Renne Crigler [place], [place]

## 2023-03-12 NOTE — Progress Notes (Signed)
Physical Therapy Treatment Patient Details Name: Alex Dunn MRN: [id number] DOB: [birth year] Today's Date: 03/12/2023   History of Present Illness Pt ia an 87 year old man admitted with severe dizziness. Head CT negative for acute changes, MRI pending. PMH: spinal cord stimulator x 6 weeks, peripheral neuropathy, BPH, HTN, CKD 3, HLD, orthostatic hypotension, tachycardia.    PT Comments    Pt with improved tolerance for mobility and ambulation today yet continues to have dizziness and instability with mobility. Pt with 2 instances of staggering, both recently after initiating ambulation, one instance of leaning posteriorly against bathroom wall to correct loss of balance. Pt reports less significant symptoms of dizziness compared to yesterday and PT notes less vigorous nystagmus than yesterday. PT assists pt in epley and semont maneuver in an effort to treat R BPPV. PT also provides VOR x 1 exercise for vertical head movement as pt often reports dizziness with downward gaze. Due to continued instability when ambulating PT recommends short term inpatient PT services as pt's spouse and daughter are likely unable to physically assist the pt in maintaining stability. PT will follow up as often as possible to continue treatment.   Recommendations for follow up therapy are one component of a multi-disciplinary discharge planning process, led by the attending physician.  Recommendations may be updated based on patient status, additional functional criteria and insurance authorization.  Follow Up Recommendations  Can patient physically be transported by private vehicle: Yes    Assistance Recommended at Discharge Frequent or constant Supervision/Assistance  Patient can return home with the following A little help with walking and/or transfers;A little help with bathing/dressing/bathroom;Assistance with cooking/housework;Assist for transportation;Help with stairs or ramp for entrance   Equipment  Recommendations  Rolling walker (2 wheels);BSC/3in1    Recommendations for Other Services       Precautions / Restrictions Precautions Precautions: Fall Restrictions Weight Bearing Restrictions: No     Mobility  Bed Mobility Overal bed mobility: Needs Assistance Bed Mobility: Rolling, Sidelying to Sit, Sit to Sidelying Rolling: Supervision Sidelying to sit: Supervision     Sit to sidelying: Supervision General bed mobility comments: assist required for quick movements in epley and semont maneuver, otherwise can perform at supervision level with increased time    Transfers Overall transfer level: Needs assistance Equipment used: Rolling walker (2 wheels) Transfers: Sit to/from Stand Sit to Stand: Min guard                Ambulation/Gait Ambulation/Gait assistance: Min assist Gait Distance (Feet): 120 Feet Assistive device: Rolling walker (2 wheels) Gait Pattern/deviations: Step-through pattern, Staggering right Gait velocity: reduced Gait velocity interpretation: <1.8 ft/sec, indicate of risk for recurrent falls   General Gait Details: pt with slowed step-through gait, verbal cues for slowed turns. Pt with one instance of rightward stagger and 2nd instance of posterior loss of balance later in bathroom   Stairs             Wheelchair Mobility    Modified Rankin (Stroke Patients Only)       Balance Overall balance assessment: Needs assistance Sitting-balance support: No upper extremity supported, Feet supported Sitting balance-Leahy Scale: Good     Standing balance support: Single extremity supported, Reliant on assistive device for balance Standing balance-Leahy Scale: Poor                              Cognition Arousal/Alertness: Awake/alert Behavior During Therapy: WFL for tasks assessed/performed Overall  Cognitive Status: Within Functional Limits for tasks assessed                                           Exercises Other Exercises Other Exercises: VOR x1 exercise vertical    General Comments General comments (skin integrity, edema, etc.): VSS on RA, pt continues to demonstrate nystagmus during session although less significant than yesterday, minimal nystagmus noted upon PT arrival, more prominent with epley and semont maneuver. Pt reports 4/10 dizziness with epley and semont maneuver, brief instance of report of symptom decrease to 2/10. Pt does report consistent dizziness with downward gaze, PT provides vertical VOR x1 exercise.      Pertinent Vitals/Pain Pain Assessment Pain Assessment: No/denies pain Faces Pain Scale: No hurt    Home Living                          Prior Function            PT Goals (current goals can now be found in the care plan section) Acute Rehab PT Goals Patient Stated Goal: to stop dizziness, return to baseline Progress towards PT goals: Progressing toward goals    Frequency    Min 5X/week      PT Plan Discharge plan needs to be updated    Co-evaluation              AM-PAC PT "6 Clicks" Mobility   Outcome Measure  Help needed turning from your back to your side while in a flat bed without using bedrails?: A Little Help needed moving from lying on your back to sitting on the side of a flat bed without using bedrails?: A Little Help needed moving to and from a bed to a chair (including a wheelchair)?: A Little Help needed standing up from a chair using your arms (e.g., wheelchair or bedside chair)?: A Little Help needed to walk in hospital room?: A Little Help needed climbing 3-5 steps with a railing? : Total 6 Click Score: 16    End of Session   Activity Tolerance: Patient tolerated treatment well Patient left: in chair;with call bell/phone within reach;with chair alarm set;with nursing/sitter in room;with family/visitor present Nurse Communication: Mobility status PT Visit Diagnosis: BPPV;Dizziness and giddiness  (R42) BPPV - Right/Left : Right     Time: 1610-9604 PT Time Calculation (min) (ACUTE ONLY): 74 min  Charges:  $Gait Training: 23-37 mins $Therapeutic Activity: 8-22 mins $Canalith Rep Proc: 23-37 mins                     Arlyss Gandy, PT, DPT Acute Rehabilitation Office [phone]    Arlyss Gandy 03/12/2023, 9:41 AM

## 2023-03-13 DIAGNOSIS — N1832 Chronic kidney disease, stage 3b: Secondary | ICD-10-CM | POA: Diagnosis not present

## 2023-03-13 DIAGNOSIS — R42 Dizziness and giddiness: Secondary | ICD-10-CM | POA: Diagnosis not present

## 2023-03-13 DIAGNOSIS — I4729 Other ventricular tachycardia: Secondary | ICD-10-CM | POA: Diagnosis not present

## 2023-03-13 DIAGNOSIS — N401 Enlarged prostate with lower urinary tract symptoms: Secondary | ICD-10-CM

## 2023-03-13 DIAGNOSIS — N138 Other obstructive and reflux uropathy: Secondary | ICD-10-CM

## 2023-03-13 MED ORDER — MECLIZINE HCL 25 MG PO TABS: 25.0000 mg | ORAL_TABLET | Freq: Three times a day (TID) | ORAL | 0 refills | Status: DC | PRN

## 2023-03-13 NOTE — TOC Progression Note (Signed)
Transition of Care [facility]) - Progression Note    Patient Details  Name: Alex Dunn MRN: [id number] Date of Birth: April 16, 1936  Transition of Care [facility]) CM/SW Contact  Gordy Clement, RN Phone Number: 03/13/2023, 9:51 AM  Clinical Narrative:     Patient will DC to home today with Wife.  Patient will attend outpatient vestibular therapy at [facility], [facility] location. Original recommendations for Home Health have changed as patient has improved. [facility] has been cancelled. [employer] will be delivering a rolling walker and a bedside commode. No additional TOC needs      Expected Discharge Plan: Home w Home Health Services Barriers to Discharge: Continued Medical Work up  Expected Discharge Plan and Services In-house Referral: Clinical Social Work Discharge Planning Services: CM Consult Post Acute Care Choice: Home Health, Durable Medical Equipment Living arrangements for the past 2 months: Single Family Home                           HH Arranged: PT, OT HH Agency: [facility] Date [facility] Agency Contacted: 03/12/23 Time HH Agency Contacted: 1425 Representative spoke with at [facility] Agency: Denyse Amass   Social Determinants of Health (SDOH) Interventions SDOH Screenings   Food Insecurity: No Food Insecurity (01/08/2022)  Housing: Low Risk  (01/08/2022)  Transportation Needs: No Transportation Needs (01/08/2022)  Depression (PHQ2-9): Low Risk  (01/14/2023)  Financial Resource Strain: Low Risk  (01/08/2022)  Physical Activity: Inactive (01/08/2022)  Social Connections: Moderately Integrated (01/08/2022)  Stress: No Stress Concern Present (01/08/2022)  Tobacco Use: Low Risk  (03/09/2023)    Readmission Risk Interventions     No data to display

## 2023-03-13 NOTE — Progress Notes (Signed)
Physical Therapy Treatment Patient Details Name: Alex Dunn MRN: [id number] DOB: [birth year] Today's Date: 03/13/2023   History of Present Illness Pt ia an 87 year old man admitted with severe dizziness. Head CT negative for acute changes, MRI negative. PMH: spinal cord stimulator x 6 weeks, peripheral neuropathy, BPH, HTN, CKD 3, HLD, orthostatic hypotension, tachycardia.    PT Comments    Pt admitted with above diagnosis. Pt and wife educated regarding exercise program for home. Wife feels that she can care for pt a home and PT issued gait belt and wife will be with pt until he is stronger and moving Independently. Pllan is to d/c home today.   Pt currently with functional limitations due to balance and endruance deficits. Pt will benefit from acute skilled PT to increase their independence and safety with mobility to allow discharge.      Recommendations for follow up therapy are one component of a multi-disciplinary discharge planning process, led by the attending physician.  Recommendations may be updated based on patient status, additional functional criteria and insurance authorization.  Follow Up Recommendations  Can patient physically be transported by private vehicle: Yes    Assistance Recommended at Discharge Intermittent Supervision/Assistance  Patient can return home with the following A little help with walking and/or transfers;A little help with bathing/dressing/bathroom;Assistance with cooking/housework;Assist for transportation;Help with stairs or ramp for entrance   Equipment Recommendations  Rolling walker (2 wheels);BSC/3in1 (issued gait belt)    Recommendations for Other Services       Precautions / Restrictions Precautions Precautions: Fall Restrictions Weight Bearing Restrictions: No     Mobility  Bed Mobility Overal bed mobility: Needs Assistance Bed Mobility: Sidelying to Sit, Rolling Rolling: Modified independent (Device/Increase time) Sidelying to  sit: Modified independent (Device/Increase time) Supine to sit: Supervision     General bed mobility comments: Repeated Epley maneuver for right BPPV    Transfers Overall transfer level: Needs assistance Equipment used: None, Rolling walker (2 wheels) Transfers: Sit to/from Stand, Bed to chair/wheelchair/BSC Sit to Stand: Min guard           General transfer comment: pt performs sit to stand with min guard assist.   PT provides reinforcement of need for walker for all transfers at the time of discharge as well aswife to guard pt with use of gait belt until pt is acclimated back at home.  Wife and pt in agreement.    Ambulation/Gait Ambulation/Gait assistance: Min guard Gait Distance (Feet): 300 Feet Assistive device: Rolling walker (2 wheels) Gait Pattern/deviations: Step-through pattern Gait velocity: reduced Gait velocity interpretation: <1.8 ft/sec, indicate of risk for recurrent falls   General Gait Details: slowed step-through gait, no significant balance deviations noted   Stairs             Wheelchair Mobility    Modified Rankin (Stroke Patients Only)       Balance Overall balance assessment: Needs assistance Sitting-balance support: No upper extremity supported, Feet supported Sitting balance-Leahy Scale: Good     Standing balance support: Reliant on assistive device for balance, Bilateral upper extremity supported, During functional activity Standing balance-Leahy Scale: Poor Standing balance comment: relies on UE support for balance                            Cognition Arousal/Alertness: Awake/alert Behavior During Therapy: WFL for tasks assessed/performed Overall Cognitive Status: Within Functional Limits for tasks assessed  Exercises Other Exercises Other Exercises: PT encourages continue performance of VOR x1 and gave [employer] handouts for gaze stabilization, Self Epley,  Austin Miles, rolling left and right and explanation of BPPV.  Educated pt and wife at length regarding all of above.    General Comments General comments (skin integrity, edema, etc.): VSS      Pertinent Vitals/Pain Pain Assessment Pain Assessment: No/denies pain Faces Pain Scale: No hurt    Home Living                          Prior Function            PT Goals (current goals can now be found in the care plan section) Progress towards PT goals: Progressing toward goals    Frequency    Min 5X/week      PT Plan Current plan remains appropriate    Co-evaluation              AM-PAC PT "6 Clicks" Mobility   Outcome Measure  Help needed turning from your back to your side while in a flat bed without using bedrails?: None Help needed moving from lying on your back to sitting on the side of a flat bed without using bedrails?: A Little Help needed moving to and from a bed to a chair (including a wheelchair)?: A Little Help needed standing up from a chair using your arms (e.g., wheelchair or bedside chair)?: A Little Help needed to walk in hospital room?: A Little Help needed climbing 3-5 steps with a railing? : A Little 6 Click Score: 19    End of Session Equipment Utilized During Treatment: Gait belt Activity Tolerance: Patient tolerated treatment well Patient left: in chair;with call bell/phone within reach;with chair alarm set;with family/visitor present Nurse Communication: Mobility status PT Visit Diagnosis: BPPV;Dizziness and giddiness (R42) BPPV - Right/Left : Right     Time: 1610-9604 PT Time Calculation (min) (ACUTE ONLY): 49 min  Charges:  $Gait Training: 8-22 mins $Therapeutic Exercise: 8-22 mins $Canalith Rep Proc: 8-22 mins                     Galileo Colello M,PT Acute Rehab Services [phone]    Bevelyn Buckles 03/13/2023, 1:09 PM

## 2023-03-13 NOTE — Discharge Summary (Signed)
PATIENT DETAILS Name: Alex Dunn Age: 87 y.o. Sex: male Date of Birth: [birth year] MRN: [id number]. Admitting Physician: Emeline General, MD WUJ:[id number], Jilda Roche, DO  Admit Date: 03/09/2023 Discharge date: 03/13/2023  Recommendations for Outpatient Follow-up:  Follow up with PCP in 1-2 weeks Please obtain CMP/CBC in one week  Admitted From:  Home  Disposition: Outpatient PT   Discharge Condition: good  CODE STATUS:   Code Status: DNR   Diet recommendation:  Diet Order             Diet - low sodium heart healthy           Diet regular Room service appropriate? Yes with Assist; Fluid consistency: Thin  Diet effective now                    Brief Summary: Patient is a 87 y.o.  male with history of HTN, CKD stage IIIb, PVC/NSVT on flecainide presented with vertigo   Significant events: 5/5>> admit to [facility]   Significant studies: 5/5>> CT angio head/neck: No significant stenosis/LVO. 5/6>> MRI brain: No acute CVA   Significant microbiology data: None   Procedures: None   Consults: Neurology    Brief Hospital Course: Vertigo likely due to BPPV Had persistent vertigo during this hospital stay-evaluated by vestibular PT/neurology-thankfully this has significantly improved by the day of discharge.  Reevaluated by physical therapy today-recommendations are for outpatient vestibular PT.   Note-MRI brain was negative-evaluated by neurology and not felt to have central causes of vertigo.   IgA kappa MGUS  Follows with Dr Myna Hidalgo   HTN BP stable Losartan   HLD Statin   Peripheral neuropathy Spinal cord stimulator in place (placed 01/24/2023 at [facility] Continue as needed tramadol   History of PVCs/NSVT Flecainide Resume outpatient follow-up with cardiology.   CKD stage IIIb At baseline   BPH Finasteride   Nutrition Status: Nutrition Problem: Inadequate oral intake Etiology: decreased appetite Signs/Symptoms: per patient/family  report Interventions: Ensure Enlive (each supplement provides 350kcal and 20 grams of protein), MVI, Liberalize Diet   BMI: Estimated body mass index is 24.94 kg/m as calculated from the following:   Height as of this encounter: 6' (1.829 m).   Weight as of this encounter: 83.4 kg.   Discharge Diagnoses:  Principal Problem:   Vertigo Active Problems:   BPH (benign prostatic hyperplasia)   Night sweats   Essential hypertension   Nonsustained ventricular tachycardia (HCC)   Stage 3b chronic kidney disease (HCC)   Neuropathic pain   Dyslipidemia   Orthostatic hypotension   DNR (do not resuscitate)   TIA (transient ischemic attack)   Discharge Instructions:  Activity:  As tolerated with Full fall precautions use walker/cane & assistance as needed   Discharge Instructions     Call MD for:  extreme fatigue   Complete by: As directed    Call MD for:  persistant dizziness or light-headedness   Complete by: As directed    Diet - low sodium heart healthy   Complete by: As directed    Discharge instructions   Complete by: As directed    Follow with Primary MD  Sharlene Dory, DO in 1-2 weeks  Please get a complete blood count and chemistry panel checked by your Primary MD at your next visit, and again as instructed by your Primary MD.  Get Medicines reviewed and adjusted: Please take all your medications with you for your next visit with your Primary MD  Laboratory/radiological data:  Please request your Primary MD to go over all hospital tests and procedure/radiological results at the follow up, please ask your Primary MD to get all Hospital records sent to his/her office.  In some cases, they will be blood work, cultures and biopsy results pending at the time of your discharge. Please request that your primary care M.D. follows up on these results.  Also Note the following: If you experience worsening of your admission symptoms, develop shortness of breath, life  threatening emergency, suicidal or homicidal thoughts you must seek medical attention immediately by calling 911 or calling your MD immediately  if symptoms less severe.  You must read complete instructions/literature along with all the possible adverse reactions/side effects for all the Medicines you take and that have been prescribed to you. Take any new Medicines after you have completely understood and accpet all the possible adverse reactions/side effects.   Do not drive when taking Pain medications or sleeping medications (Benzodaizepines)  Do not take more than prescribed Pain, Sleep and Anxiety Medications. It is not advisable to combine anxiety,sleep and pain medications without talking with your primary care practitioner  Special Instructions: If you have smoked or chewed Tobacco  in the last 2 yrs please stop smoking, stop any regular Alcohol  and or any Recreational drug use.  Wear Seat belts while driving.  Please note: You were cared for by a hospitalist during your hospital stay. Once you are discharged, your primary care physician will handle any further medical issues. Please note that NO REFILLS for any discharge medications will be authorized once you are discharged, as it is imperative that you return to your primary care physician (or establish a relationship with a primary care physician if you do not have one) for your post hospital discharge needs so that they can reassess your need for medications and monitor your lab values.   Increase activity slowly   Complete by: As directed       Allergies as of 03/13/2023       Reactions   Amitriptyline Other (See Comments)   SLEPT FOR 18-19 HOURS STRAIGHT   Codeine Phosphate Other (See Comments)   Seen odd things crawling up wall   Pregabalin Other (See Comments)   Drowsy, nightmares, stumbling   Tamsulosin Other (See Comments)   Faint feeling   Tape Other (See Comments)   Skin irritation   Lexapro [escitalopram Oxalate]  Other (See Comments)   shaking        Medication List     TAKE these medications    acetaminophen 325 MG tablet Commonly known as: TYLENOL Take 650 mg by mouth 4 (four) times daily as needed for mild pain or moderate pain (pain).   aspirin EC 81 MG tablet Take 81 mg by mouth daily. Swallow whole.   atorvastatin 10 MG tablet Commonly known as: LIPITOR Take 1 tablet (10 mg total) by mouth daily.   cyanocobalamin 1000 MCG tablet Take 1,000 mcg by mouth daily.   finasteride 5 MG tablet Commonly known as: Proscar Take 1 tablet (5 mg total) by mouth daily.   flecainide 50 MG tablet Commonly known as: TAMBOCOR TAKE 1 TABLET(50 MG) BY MOUTH TWICE DAILY What changed:  how much to take how to take this when to take this additional instructions   losartan 50 MG tablet Commonly known as: COZAAR TAKE 1 TABLET(50 MG) BY MOUTH DAILY What changed: See the new instructions.   meclizine 25 MG tablet Commonly known as: ANTIVERT Take 1  tablet (25 mg total) by mouth 3 (three) times daily as needed for dizziness.   pantoprazole 40 MG tablet Commonly known as: PROTONIX Take 1 tablet (40 mg total) by mouth daily as needed. What changed: reasons to take this   PreserVision AREDS Caps Take 1 capsule by mouth daily.   traMADol 50 MG tablet Commonly known as: ULTRAM Take 1 tablet (50 mg total) by mouth every 12 (twelve) hours as needed for moderate pain or severe pain. TAKE 1 TABLET(50 MG) BY MOUTH EVERY 12 HOURS AS NEEDED FOR MODERATE PAIN What changed:  when to take this additional instructions   Vitamin D 50 MCG (2000 UT) Caps Take 2,000 Units by mouth daily at 6 (six) AM.        Follow-up Information     Sharlene Dory, DO. Schedule an appointment as soon as possible for a visit in 1 week(s).   Specialty: Family Medicine Contact information: [address] [place] [address] [phone]                Allergies  Allergen  Reactions   Amitriptyline Other (See Comments)    SLEPT FOR 18-19 HOURS STRAIGHT   Codeine Phosphate Other (See Comments)    Seen odd things crawling up wall   Pregabalin Other (See Comments)    Drowsy, nightmares, stumbling   Tamsulosin Other (See Comments)    Faint feeling    Tape Other (See Comments)    Skin irritation   Lexapro [Escitalopram Oxalate] Other (See Comments)    shaking     Other Procedures/Studies: MR BRAIN WO CONTRAST  Result Date: 03/10/2023 CLINICAL DATA:  Stroke follow-up EXAM: MRI HEAD WITHOUT CONTRAST TECHNIQUE: Multiplanar, multiecho pulse sequences of the brain and surrounding structures were obtained without intravenous contrast. COMPARISON:  None Available. FINDINGS: Brain: No acute infarct, mass effect or extra-axial collection. No acute or chronic hemorrhage. There is multifocal hyperintense T2-weighted signal within the white matter. Parenchymal volume and CSF spaces are normal. The midline structures are normal. Vascular: Major flow voids are preserved. Skull and upper cervical spine: Normal calvarium and skull base. Visualized upper cervical spine and soft tissues are normal. Sinuses/Orbits:No paranasal sinus fluid levels or advanced mucosal thickening. No mastoid or middle ear effusion. Normal orbits. IMPRESSION: 1. No acute intracranial abnormality. 2. Findings of chronic small vessel ischemia. Electronically Signed   By: Deatra Robinson M.D.   On: 03/10/2023 23:42   DG Abd 1 View  Result Date: 03/09/2023 CLINICAL DATA:  [id number] Vertigo [id number] EXAM: ABDOMEN - 1 VIEW COMPARISON:  January 24, 2023. FINDINGS: Incomplete visualization of the lateral soft tissues. There is a spinal cord stimulator with leads projecting over the level of L1. Excreted contrast in the bladder. No dilated loops of bowel are seen. IMPRESSION: Spinal cord stimulator leads projecting over the level of L1. Electronically Signed   By: Meda Klinefelter M.D.   On: 03/09/2023 21:00   CT ANGIO  HEAD NECK W WO CM  Result Date: 03/09/2023 CLINICAL DATA:  Neuro deficit, acute, stroke suspected. Dizziness. Vertigo. EXAM: CT ANGIOGRAPHY HEAD AND NECK WITH AND WITHOUT CONTRAST TECHNIQUE: Multidetector CT imaging of the head and neck was performed using the standard protocol during bolus administration of intravenous contrast. Multiplanar CT image reconstructions and MIPs were obtained to evaluate the vascular anatomy. Carotid stenosis measurements (when applicable) are obtained utilizing NASCET criteria, using the distal internal carotid diameter as the denominator. RADIATION DOSE REDUCTION: This exam was performed according to  the departmental dose-optimization program which includes automated exposure control, adjustment of the mA and/or kV according to patient size and/or use of iterative reconstruction technique. CONTRAST:  60mL OMNIPAQUE IOHEXOL 350 MG/ML SOLN COMPARISON:  MRI 08/20/2021 FINDINGS: CT HEAD FINDINGS Brain: No focal abnormality seen affecting the brainstem or cerebellum. 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 Mild chronic small-vessel ischemic change of the hemispheric white matter, less than often seen at this age. No large vessel territory stroke. No mass, hemorrhage, hydrocephalus or extra-axial collection. Vascular: There is atherosclerotic calcification of the major vessels at the base of the brain. Skull: Negative Sinuses/Orbits: Clear/normal Other: No fluid in the middle ears or mastoid air cells. Review of the MIP images confirms the above findings CTA NECK FINDINGS Aortic arch: Aortic atherosclerosis.  No aneurysm. Right carotid system: Common carotid artery widely patent to the bifurcation. Mild calcified plaque at the carotid bifurcation and ICA bulb but no stenosis. Cervical ICA normal beyond that. Left carotid system: Common carotid artery widely patent to the bifurcation. Minimal calcified plaque at the bifurcation but no stenosis. Cervical ICA normal beyond that.  Vertebral arteries: Left vertebral artery is dominant. Plaque adjacent to both vertebral artery origins but no stenosis. Both vertebral arteries are patent through the cervical region to the foramen magnum. Skeleton: Ordinary chronic cervical spondylosis. Other neck: No mass or lymphadenopathy. Upper chest: No active disease at the lung apices. Review of the MIP images confirms the above findings CTA HEAD FINDINGS Anterior circulation: Both internal carotid arteries are patent through the skull base and siphon regions. No siphon stenosis. The anterior and middle cerebral vessels are patent. No large vessel occlusion or proximal stenosis. No aneurysm or vascular malformation. Posterior circulation: Both vertebral arteries are patent through the foramen magnum. There is some atherosclerotic calcification of the vertebral artery V4 segments but no stenosis. Basilar artery is tortuous but not stenotic. Superior cerebellar and posterior cerebral arteries show flow. Large patent posterior communicating artery on the right. Venous sinuses: Patent and normal. Anatomic variants: None significant. Review of the MIP images confirms the above findings IMPRESSION: 1. No acute head CT finding. 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 Mild chronic small-vessel ischemic change of the white matter, less than often seen at this age. 2. No intracranial large vessel occlusion or proximal stenosis. 3. Aortic atherosclerosis. Mild atherosclerotic change at both carotid bifurcations but no stenosis. 4. Mild atherosclerotic change of the vertebral arteries but no flow limiting stenosis. 5. Today's study does not show an explanation for the clinical presentation. Aortic Atherosclerosis (ICD10-I70.0). Electronically Signed   By: Paulina Fusi M.D.   On: 03/09/2023 12:26     TODAY-DAY OF DISCHARGE:  Subjective:   Alex Dunn today has no headache,no chest abdominal pain,no new weakness tingling or numbness, feels much better  wants to go home today.   Objective:   Blood pressure 134/71, pulse 80, temperature 98.2 F (36.8 C), temperature source Oral, resp. rate 18, height 6' (1.829 m), weight 83.4 kg, SpO2 96 %.  Intake/Output Summary (Last 24 hours) at 03/13/2023 0953 Last data filed at 03/13/2023 0431 Gross per 24 hour  Intake 480 ml  Output 600 ml  Net -120 ml   Filed Weights   03/09/23 1021 03/09/23 1700  Weight: 87.5 kg 83.4 kg    Exam: Awake Alert, Oriented *3, No new F.N deficits, Normal affect [place].AT,PERRAL Supple Neck,No JVD, No cervical lymphadenopathy appriciated.  Symmetrical Chest wall movement, Good air movement bilaterally, CTAB RRR,No Gallops,Rubs or  new Murmurs, No Parasternal Heave +ve B.Sounds, Abd Soft, Non tender, No organomegaly appriciated, No rebound -guarding or rigidity. No Cyanosis, Clubbing or edema, No new Rash or bruise   PERTINENT RADIOLOGIC STUDIES: No results found.   PERTINENT LAB RESULTS: CBC: Recent Labs    03/11/23 0414  WBC 6.7  HGB 13.0  HCT 39.4  PLT 154   CMET CMP     Component Value Date/Time   NA 137 03/11/2023 0414   NA 142 07/01/2019 1154   K 4.4 03/11/2023 0414   CL 107 03/11/2023 0414   CO2 25 03/11/2023 0414   GLUCOSE 90 03/11/2023 0414   BUN 20 03/11/2023 0414   BUN 25 07/01/2019 1154   CREATININE 1.60 (H) 03/11/2023 0414   CREATININE 1.79 (H) 02/24/2023 1428   CREATININE 1.61 (H) 02/26/2019 1511   CALCIUM 8.6 (L) 03/11/2023 0414   PROT 6.5 02/24/2023 1428   PROT 6.6 07/20/2018 0944   ALBUMIN 3.7 02/24/2023 1428   AST 14 (L) 02/24/2023 1428   ALT 12 02/24/2023 1428   ALKPHOS 110 02/24/2023 1428   BILITOT 0.4 02/24/2023 1428   GFRNONAA 42 (L) 03/11/2023 0414   GFRNONAA 37 (L) 02/24/2023 1428   GFRAA 48 (L) 04/27/2020 1750   GFRAA 39 (L) 03/07/2020 1250    GFR Estimated Creatinine Clearance: 37.7 mL/min (A) (by C-G formula based on SCr of 1.6 mg/dL (H)). No results for input(s): "LIPASE", "AMYLASE" in the last 72 hours. No  results for input(s): "CKTOTAL", "CKMB", "CKMBINDEX", "TROPONINI" in the last 72 hours. Invalid input(s): "POCBNP" No results for input(s): "DDIMER" in the last 72 hours. No results for input(s): "HGBA1C" in the last 72 hours. No results for input(s): "CHOL", "HDL", "LDLCALC", "TRIG", "CHOLHDL", "LDLDIRECT" in the last 72 hours. No results for input(s): "TSH", "T4TOTAL", "T3FREE", "THYROIDAB" in the last 72 hours.  Invalid input(s): "FREET3" No results for input(s): "VITAMINB12", "FOLATE", "FERRITIN", "TIBC", "IRON", "RETICCTPCT" in the last 72 hours. Coags: No results for input(s): "INR" in the last 72 hours.  Invalid input(s): "PT" Microbiology: No results found for this or any previous visit (from the past 240 hour(s)).  FURTHER DISCHARGE INSTRUCTIONS:  Get Medicines reviewed and adjusted: Please take all your medications with you for your next visit with your Primary MD  Laboratory/radiological data: Please request your Primary MD to go over all hospital tests and procedure/radiological results at the follow up, please ask your Primary MD to get all Hospital records sent to his/her office.  In some cases, they will be blood work, cultures and biopsy results pending at the time of your discharge. Please request that your primary care M.D. goes through all the records of your hospital data and follows up on these results.  Also Note the following: If you experience worsening of your admission symptoms, develop shortness of breath, life threatening emergency, suicidal or homicidal thoughts you must seek medical attention immediately by calling 911 or calling your MD immediately  if symptoms less severe.  You must read complete instructions/literature along with all the possible adverse reactions/side effects for all the Medicines you take and that have been prescribed to you. Take any new Medicines after you have completely understood and accpet all the possible adverse reactions/side  effects.   Do not drive when taking Pain medications or sleeping medications (Benzodaizepines)  Do not take more than prescribed Pain, Sleep and Anxiety Medications. It is not advisable to combine anxiety,sleep and pain medications without talking with your primary care practitioner  Special Instructions:  If you have smoked or chewed Tobacco  in the last 2 yrs please stop smoking, stop any regular Alcohol  and or any Recreational drug use.  Wear Seat belts while driving.  Please note: You were cared for by a hospitalist during your hospital stay. Once you are discharged, your primary care physician will handle any further medical issues. Please note that NO REFILLS for any discharge medications will be authorized once you are discharged, as it is imperative that you return to your primary care physician (or establish a relationship with a primary care physician if you do not have one) for your post hospital discharge needs so that they can reassess your need for medications and monitor your lab values.  Total Time spent coordinating discharge including counseling, education and face to face time equals greater than 30 minutes.  SignedJeoffrey Massed 03/13/2023 9:53 AM

## 2023-03-14 ENCOUNTER — Telehealth: Payer: Self-pay | Admitting: *Deleted

## 2023-03-14 ENCOUNTER — Encounter: Payer: Self-pay | Admitting: *Deleted

## 2023-03-19 ENCOUNTER — Encounter: Payer: Self-pay | Admitting: Family Medicine

## 2023-03-19 ENCOUNTER — Ambulatory Visit (INDEPENDENT_AMBULATORY_CARE_PROVIDER_SITE_OTHER): Payer: Medicare Other | Admitting: Family Medicine

## 2023-03-19 VITALS — BP 138/80 | HR 64 | Temp 98.2°F | Ht 72.0 in | Wt 191.4 lb

## 2023-03-19 DIAGNOSIS — H8111 Benign paroxysmal vertigo, right ear: Secondary | ICD-10-CM

## 2023-03-19 DIAGNOSIS — H6993 Unspecified Eustachian tube disorder, bilateral: Secondary | ICD-10-CM | POA: Diagnosis not present

## 2023-03-19 MED ORDER — FLUTICASONE PROPIONATE 50 MCG/ACT NA SUSP: 2.0000 | Freq: Every day | NASAL | 6 refills | Status: DC

## 2023-03-19 NOTE — Patient Instructions (Addendum)
Give [place] 2-3 business days to get the results of your labs back.   Stay active.   Go back on the Flonase.   Flonase (fluticasone); nasal spray that is over the counter. 2 sprays each nostril, once daily. Aim towards the same side eye when you spray. Compare over the counter prices to what I send in.   Send me a message in a few weeks if no better with the ears.   Let us know if you need anything.

## 2023-03-19 NOTE — Progress Notes (Signed)
Chief Complaint  Alex Dunn presents with   Hospitalization Follow-up    Subjective: Alex Dunn is a 87 y.o. male here for hospital follow-up.  He is here with his wife.  Alex Dunn was admitted on 03/09/2023 and discharged on 03/13/2023 from [facility].  He was treated for BPPV.  He was discharged home to OP physical therapy with a rolling walker.  He is much better since discharge.  He is a bit weak but that is improving.  He has an appointment with physical therapy in less than a week.  He also has an appointment with neurology.  He has no vision changes, difficulty with speech, trouble swallowing, nausea, vomiting, diarrhea, or continued spinning when he turns his head to the right.  He does still have some fullness in his ears and hearing issues.  He is sensitive to the pollen and thought that how this started.  He has Flonase at home but has not been using it routinely.  No drainage from his ears.  He had wax removal in the emergency department.  Past Medical History:  Diagnosis Date   Abdominal aortic atherosclerosis (HCC) 04/15/2018   ANEMIA-IRON DEFICIENCY 06/23/2007   B12 deficiency 06/13/2020   BPH (benign prostatic hyperplasia) 06/23/2007   Qualifier: Diagnosis of  By: Marcelyn Ditty, RN, Katy Fitch   Formatting of this note might be different from the original. Formatting of this note might be different from the original. Qualifier: Diagnosis of  By: Marcelyn Ditty, RN, Katy Fitch   Degeneration of lumbar intervertebral disc 06/30/2018   Diverticulosis    Elevated diaphragm 04/15/2018   Essential hypertension 04/15/2018   Gastro-esophageal reflux disease without esophagitis 06/23/2007   Qualifier: Diagnosis of  By: Marcelyn Ditty, RN, Katy Fitch   Formatting of this note might be different from the original. Formatting of this note might be different from the original. Qualifier: Diagnosis of  By: Marcelyn Ditty, RN, Katy Fitch   Lumbar radiculopathy 05/06/2019   Lumbosacral spondylosis without myelopathy 03/10/2019   Monoclonal  gammopathy of unknown significance (MGUS) 08/24/2018   Multiple renal cysts 04/06/2018   Nonsustained ventricular tachycardia (HCC) 11/03/2019   Primary osteoarthritis of both knees 06/30/2018   Bilateral moderate with moderate chondromalacia patella Formatting of this note might be different from the original. Formatting of this note might be different from the original. Bilateral moderate with moderate chondromalacia patella   Stage 3b chronic kidney disease (HCC) 06/21/2020    Objective: BP 138/80 (BP Location: Left Arm, Cuff Size: Normal)   Pulse 64   Temp 98.2 F (36.8 C) (Oral)   Ht 6' (1.829 m)   Wt 191 lb 6 oz (86.8 kg)   SpO2 98%   BMI 25.96 kg/m  General: Awake, appears stated age Heart: RRR, 2+ pitting bilateral LE edema tapering at the proximal third of the tibia Lungs: CTAB, no rales, wheezes or rhonchi. No accessory muscle use Ears: Canals are patent without otorrhea, TMs negative bilaterally Psych: Age appropriate judgment and insight, normal affect and mood  Assessment and Plan: Benign paroxysmal positional vertigo of right ear - Plan: CBC, Comprehensive metabolic panel  Dysfunction of both eustachian tubes - Plan: fluticasone (FLONASE) 50 MCG/ACT nasal spray  Will follow-up on blood work from the hospitalization.  Appreciate physical therapy and neurology.  Appears to be in a resolved position. I think the above diagnosis was a red herring for this.  He will resume Flonase 2 sprays daily.  Send message if no improvement, consider 5-day prednisone burst 40 mg daily. The Alex Dunn and  his spouse voiced understanding and agreement to the plan.  I spent 30 minutes with the Alex Dunn and his spouse discussing the above plans in addition to reviewing his chart on the same day of the visit.  Jilda Roche [place], DO 03/19/23  4:21 PM

## 2023-03-20 LAB — COMPREHENSIVE METABOLIC PANEL
ALT: 17 U/L (ref 0–53)
AST: 16 U/L (ref 0–37)
Albumin: 3.9 g/dL (ref 3.5–5.2)
Alkaline Phosphatase: 120 U/L — ABNORMAL HIGH (ref 39–117)
BUN: 26 mg/dL — ABNORMAL HIGH (ref 6–23)
CO2: 28 mEq/L (ref 19–32)
Calcium: 9 mg/dL (ref 8.4–10.5)
Chloride: 103 mEq/L (ref 96–112)
Creatinine, Ser: 1.56 mg/dL — ABNORMAL HIGH (ref 0.40–1.50)
GFR: 40.45 mL/min — ABNORMAL LOW (ref >60.00)
Glucose, Bld: 96 mg/dL (ref 70–99)
Potassium: 4.8 mEq/L (ref 3.5–5.1)
Sodium: 139 mEq/L (ref 135–145)
Total Bilirubin: 0.5 mg/dL (ref 0.2–1.2)
Total Protein: 6.6 g/dL (ref 6.0–8.3)

## 2023-03-20 LAB — CBC
HCT: 42 % (ref 39.0–52.0)
Hemoglobin: 13.9 g/dL (ref 13.0–17.0)
MCHC: 33.1 g/dL (ref 30.0–36.0)
MCV: 96.9 fl (ref 78.0–100.0)
Platelets: 202 10*3/uL (ref 150.0–400.0)
RBC: 4.33 Mil/uL (ref 4.22–5.81)
RDW: 13.8 % (ref 11.5–15.5)
WBC: 6.9 10*3/uL (ref 4.0–10.5)

## 2023-03-21 ENCOUNTER — Other Ambulatory Visit: Payer: Self-pay | Admitting: Family Medicine

## 2023-03-21 DIAGNOSIS — H8111 Benign paroxysmal vertigo, right ear: Secondary | ICD-10-CM

## 2023-03-25 ENCOUNTER — Ambulatory Visit: Payer: Medicare Other | Attending: Neurology | Admitting: Physical Therapy

## 2023-03-25 DIAGNOSIS — H8111 Benign paroxysmal vertigo, right ear: Secondary | ICD-10-CM | POA: Insufficient documentation

## 2023-03-26 ENCOUNTER — Encounter: Payer: Self-pay | Admitting: Physical Therapy

## 2023-03-26 DIAGNOSIS — M961 Postlaminectomy syndrome, not elsewhere classified: Secondary | ICD-10-CM | POA: Diagnosis not present

## 2023-03-26 DIAGNOSIS — Z9689 Presence of other specified functional implants: Secondary | ICD-10-CM | POA: Diagnosis not present

## 2023-03-26 DIAGNOSIS — G629 Polyneuropathy, unspecified: Secondary | ICD-10-CM | POA: Diagnosis not present

## 2023-03-26 NOTE — Therapy (Signed)
[facility] [address] [place], [place], [address] Phone: [phone]   Fax:  [phone]  Physical Therapy Evaluation - ARRIVED/NO CHARGE  Patient Details  Name: Alex Dunn MRN: [id number] Date of Birth: 07-20-1936 No data recorded  Encounter Date: 03/25/2023   PT End of Session - 03/26/23 1731     Visit Number 0    PT Start Time 1403    PT Stop Time 1425    PT Time Calculation (min) 22 min             Past Medical History:  Diagnosis Date   Abdominal aortic atherosclerosis (HCC) 04/15/2018   ANEMIA-IRON DEFICIENCY 06/23/2007   B12 deficiency 06/13/2020   BPH (benign prostatic hyperplasia) 06/23/2007   Qualifier: Diagnosis of  By: Marcelyn Ditty, RN, Katy Fitch   Formatting of this note might be different from the original. Formatting of this note might be different from the original. Qualifier: Diagnosis of  By: Marcelyn Ditty, RN, Katy Fitch   Degeneration of lumbar intervertebral disc 06/30/2018   Diverticulosis    Elevated diaphragm 04/15/2018   Essential hypertension 04/15/2018   Gastro-esophageal reflux disease without esophagitis 06/23/2007   Qualifier: Diagnosis of  By: Marcelyn Ditty, RN, Katy Fitch   Formatting of this note might be different from the original. Formatting of this note might be different from the original. Qualifier: Diagnosis of  By: Marcelyn Ditty, RN, Katy Fitch   Lumbar radiculopathy 05/06/2019   Lumbosacral spondylosis without myelopathy 03/10/2019   Monoclonal gammopathy of unknown significance (MGUS) 08/24/2018   Multiple renal cysts 04/06/2018   Nonsustained ventricular tachycardia (HCC) 11/03/2019   Primary osteoarthritis of both knees 06/30/2018   Bilateral moderate with moderate chondromalacia patella Formatting of this note might be different from the original. Formatting of this note might be different from the original. Bilateral moderate with moderate chondromalacia patella   Stage 3b chronic kidney disease (HCC)  06/21/2020    Past Surgical History:  Procedure Laterality Date   HAND SURGERY     LUNG SURGERY     for collapsed lung   SPINAL CORD STIMULATOR IMPLANT  01/24/2023    There were no vitals filed for this visit.     ARRIVE/NO CHARGE ----   Pt arrived for PT evaluation accompanied by his wife.  Pt reports he had spinal stimulator implanted 8 weeks ago; went to [facility] ED on 03-09-23 with onset of vertigo. Pt was admitted and discharged on 03-13-23.  During the hospitalization pt was treated for the vertigo with Epley maneuver; pt states the treatment irritated the location of the spinal stimulator implant (on his Lt lateral side). Pt reports the vertigo is much improved at this time - reports he feels 90-95% better.  He reports he has an appt with his doctor on Wed., May 22 and would like to hold on PT at this time until he discusses this issue of discomfort/ tenderness at site of spinal cord stimulator implant with his MD.  Pt declines positional testing for vertigo at this time due to fear of further irritation of spinal stimulator implant - "wants to sure it is still positioned correctly".  Plan is to call pt on Thursday, May 23 to find out if he will return for PT initial eval for BPPV or if therapy referral is to be discontinued at this time.  Patient will benefit from skilled therapeutic intervention in order to improve the following deficits and impairments:     Visit Diagnosis: BPPV (benign paroxysmal positional vertigo), right     Problem List Patient Active Problem List   Diagnosis Date Noted   TIA (transient ischemic attack) 03/11/2023   Vertigo 03/09/2023   Dyslipidemia 03/09/2023   Orthostatic hypotension 03/09/2023   DNR (do not resuscitate) 03/09/2023   Cerebellar ataxia in diseases classified elsewhere (HCC) 12/07/2021   CIDP (chronic inflammatory demyelinating polyneuropathy) (HCC) 12/07/2021    History of CVA (cerebrovascular accident) 09/14/2021   Neuropathic pain 06/05/2021   PVC's (premature ventricular contractions) 03/01/2021   Polycystic kidney disease    MGUS (monoclonal gammopathy of unknown significance)    Diverticulosis    Stage 3b chronic kidney disease (HCC) 06/21/2020   B12 deficiency 06/13/2020   Ataxia 02/14/2020   Bilateral leg pain 02/14/2020   Weakness of both lower extremities 01/10/2020   Balance problem 01/10/2020   Nonsustained ventricular tachycardia (HCC) 11/03/2019   Lumbar radiculopathy 05/06/2019   Lumbosacral spondylosis without myelopathy 03/10/2019   Low back pain 03/09/2019   Monoclonal gammopathy of unknown significance (MGUS) 08/24/2018   Primary osteoarthritis of both knees 06/30/2018   Degeneration of lumbar intervertebral disc 06/30/2018   Essential hypertension 04/15/2018   Diverticular disease 04/15/2018   Abdominal aortic atherosclerosis (HCC) 04/15/2018   Elevated diaphragm 04/15/2018   Multiple renal cysts 04/06/2018   Night sweats 03/13/2018   Weakness 03/13/2018   Chronic kidney disease 10/18/2016   ANEMIA-IRON DEFICIENCY 06/23/2007   Gastro-esophageal reflux disease without esophagitis 06/23/2007   BPH (benign prostatic hyperplasia) 06/23/2007   GERD 06/23/2007    Jami Bogdanski, Donavan Burnet, PT 03/26/2023, 5:33 PM  [facility] [address] [place], [place], [address] Phone: [phone]   Fax:  [phone]  Name: Alex Dunn MRN: [id number] Date of Birth: 09/02/1936

## 2023-04-04 ENCOUNTER — Other Ambulatory Visit (INDEPENDENT_AMBULATORY_CARE_PROVIDER_SITE_OTHER): Payer: Medicare Other

## 2023-04-04 DIAGNOSIS — H8111 Benign paroxysmal vertigo, right ear: Secondary | ICD-10-CM | POA: Diagnosis not present

## 2023-04-04 LAB — HEPATIC FUNCTION PANEL
ALT: 14 U/L (ref 0–53)
AST: 14 U/L (ref 0–37)
Albumin: 3.9 g/dL (ref 3.5–5.2)
Alkaline Phosphatase: 109 U/L (ref 39–117)
Bilirubin, Direct: 0.2 mg/dL (ref 0.0–0.3)
Total Bilirubin: 0.7 mg/dL (ref 0.2–1.2)
Total Protein: 6.7 g/dL (ref 6.0–8.3)

## 2023-04-04 LAB — GAMMA GT: GGT: 11 U/L (ref 7–51)

## 2023-04-04 NOTE — Addendum Note (Signed)
Addended by: Mervin Kung A on: 04/04/2023 10:14 AM   Modules accepted: Orders

## 2023-04-12 LAB — ALKALINE PHOSPHATASE, ISOENZYMES
Alkaline Phosphatase: 128 IU/L — ABNORMAL HIGH (ref 44–121)
BONE FRACTION: 26 % (ref 12–68)
INTESTINAL FRAC.: 7 % (ref 0–18)
LIVER FRACTION: 67 % (ref 13–88)

## 2023-05-26 ENCOUNTER — Other Ambulatory Visit: Payer: Self-pay | Admitting: *Deleted

## 2023-05-26 MED ORDER — LOSARTAN POTASSIUM 50 MG PO TABS: 50.0000 mg | ORAL_TABLET | Freq: Every day | ORAL | 0 refills | Status: DC

## 2023-06-05 DIAGNOSIS — H353132 Nonexudative age-related macular degeneration, bilateral, intermediate dry stage: Secondary | ICD-10-CM | POA: Diagnosis not present

## 2023-06-05 DIAGNOSIS — H524 Presbyopia: Secondary | ICD-10-CM | POA: Diagnosis not present

## 2023-06-19 ENCOUNTER — Encounter (INDEPENDENT_AMBULATORY_CARE_PROVIDER_SITE_OTHER): Payer: Self-pay

## 2023-06-24 ENCOUNTER — Other Ambulatory Visit: Payer: Self-pay

## 2023-06-24 MED ORDER — FLECAINIDE ACETATE 50 MG PO TABS: ORAL_TABLET | ORAL | 0 refills | Status: DC

## 2023-06-30 ENCOUNTER — Ambulatory Visit: Payer: Medicare Other | Admitting: Internal Medicine

## 2023-06-30 ENCOUNTER — Encounter: Payer: Self-pay | Admitting: Internal Medicine

## 2023-06-30 VITALS — BP 162/92 | HR 69 | Ht 72.0 in | Wt 194.0 lb

## 2023-06-30 DIAGNOSIS — I493 Ventricular premature depolarization: Secondary | ICD-10-CM

## 2023-06-30 NOTE — Progress Notes (Signed)
HPI Alex Dunn returns today for ongoing evaluation of NSVT/PVC's. He does not have palpitations but he does note that during these times his pulse will be low and he has had documented ventricular bigeminy. He has done well on flecainide. He feels well but does note some orthostatic symptoms. His bp at home is normal. He has held his bp meds when his bp is a little low.  Allergies  Allergen Reactions   Amitriptyline Other (See Comments)    SLEPT FOR 18-19 HOURS STRAIGHT   Codeine Phosphate Other (See Comments)    Seen odd things crawling up wall   Pregabalin Other (See Comments)    Drowsy, nightmares, stumbling   Tamsulosin Other (See Comments)    Faint feeling    Tape Other (See Comments)    Skin irritation   Lexapro [Escitalopram Oxalate] Other (See Comments)    shaking     Current Outpatient Medications  Medication Sig Dispense Refill   acetaminophen (TYLENOL) 325 MG tablet Take 650 mg by mouth 4 (four) times daily as needed for mild pain or moderate pain (pain).     aspirin EC 81 MG tablet Take 81 mg by mouth daily. Swallow whole.     atorvastatin (LIPITOR) 10 MG tablet Take 1 tablet (10 mg total) by mouth daily. 90 tablet 3   Cholecalciferol (VITAMIN D) 50 MCG (2000 UT) CAPS Take 2,000 Units by mouth daily at 6 (six) AM.     cyanocobalamin 1000 MCG tablet Take 1,000 mcg by mouth daily.     finasteride (PROSCAR) 5 MG tablet Take 1 tablet (5 mg total) by mouth daily. 90 tablet 1   flecainide (TAMBOCOR) 50 MG tablet TAKE 1 TABLET(50 MG) BY MOUTH TWICE DAILY 180 tablet 0   fluticasone (FLONASE) 50 MCG/ACT nasal spray Place 2 sprays into both nostrils daily. 16 g 6   losartan (COZAAR) 50 MG tablet Take 1 tablet (50 mg total) by mouth daily. TAKE 1 TABLET(50 MG) BY MOUTH DAILY Strength: 50 mg 90 tablet 0   meclizine (ANTIVERT) 25 MG tablet Take 1 tablet (25 mg total) by mouth 3 (three) times daily as needed for dizziness. 30 tablet 0   Multiple Vitamins-Minerals  (PRESERVISION AREDS) CAPS Take 1 capsule by mouth daily.     pantoprazole (PROTONIX) 40 MG tablet Take 1 tablet (40 mg total) by mouth daily as needed. (Patient taking differently: Take 40 mg by mouth daily as needed Genella Rife).) 90 tablet 3   traMADol (ULTRAM) 50 MG tablet Take 1 tablet (50 mg total) by mouth every 12 (twelve) hours as needed for moderate pain or severe pain. TAKE 1 TABLET(50 MG) BY MOUTH EVERY 12 HOURS AS NEEDED FOR MODERATE PAIN (Patient taking differently: Take 50 mg by mouth daily as needed for moderate pain or severe pain.) 90 tablet 1   No current facility-administered medications for this visit.     Past Medical History:  Diagnosis Date   Abdominal aortic atherosclerosis (HCC) 04/15/2018   ANEMIA-IRON DEFICIENCY 06/23/2007   B12 deficiency 06/13/2020   BPH (benign prostatic hyperplasia) 06/23/2007   Qualifier: Diagnosis of  By: Marcelyn Ditty, RN, Katy Fitch   Formatting of this note might be different from the original. Formatting of this note might be different from the original. Qualifier: Diagnosis of  By: Marcelyn Ditty, RN, Katy Fitch   Degeneration of lumbar intervertebral disc 06/30/2018   Diverticulosis    Elevated diaphragm 04/15/2018   Essential hypertension 04/15/2018   Gastro-esophageal reflux disease without esophagitis  06/23/2007   Qualifier: Diagnosis of  By: Marcelyn Ditty, RN, Katy Fitch   Formatting of this note might be different from the original. Formatting of this note might be different from the original. Qualifier: Diagnosis of  By: Marcelyn Ditty, RN, Katy Fitch   Lumbar radiculopathy 05/06/2019   Lumbosacral spondylosis without myelopathy 03/10/2019   Monoclonal gammopathy of unknown significance (MGUS) 08/24/2018   Multiple renal cysts 04/06/2018   Nonsustained ventricular tachycardia (HCC) 11/03/2019   Primary osteoarthritis of both knees 06/30/2018   Bilateral moderate with moderate chondromalacia patella Formatting of this note might be different from the original. Formatting of  this note might be different from the original. Bilateral moderate with moderate chondromalacia patella   Stage 3b chronic kidney disease (HCC) 06/21/2020    ROS:   All systems reviewed and negative except as noted in the HPI.   Past Surgical History:  Procedure Laterality Date   HAND SURGERY     LUNG SURGERY     for collapsed lung   SPINAL CORD STIMULATOR IMPLANT  01/24/2023     Family History  Problem Relation Age of Onset   Heart attack Mother    Heart attack Father    Heart attack Brother        "leaky valve"   Neuromuscular disorder Neg Hx      Social History   Socioeconomic History   Marital status: Married    Spouse name: Not on file   Number of children: 1   Years of education: Not on file   Highest education level: High school graduate  Occupational History   Occupation: retired  Tobacco Use   Smoking status: Never    Passive exposure: Never   Smokeless tobacco: Never  Vaping Use   Vaping status: Never Used  Substance and Sexual Activity   Alcohol use: Never   Drug use: Never   Sexual activity: Not on file  Other Topics Concern   Not on file  Social History Narrative   Lives at home with his wife   Retired   Right handed   Caffeine: about 2 cups daily   Social Determinants of Health   Financial Resource Strain: Low Risk  (01/08/2022)   Overall Financial Resource Strain (CARDIA)    Difficulty of Paying Living Expenses: Not hard at all  Food Insecurity: No Food Insecurity (03/14/2023)   Hunger Vital Sign    Worried About Running Out of Food in the Last Year: Never true    Ran Out of Food in the Last Year: Never true  Transportation Needs: No Transportation Needs (03/14/2023)   PRAPARE - Administrator, Civil Service (Medical): No    Lack of Transportation (Non-Medical): No  Physical Activity: Inactive (01/08/2022)   Exercise Vital Sign    Days of Exercise per Week: 0 days    Minutes of Exercise per Session: 0 min  Stress: No Stress  Concern Present (01/08/2022)   [employer] of Occupational Health - Occupational Stress Questionnaire    Feeling of Stress : Not at all  Social Connections: Moderately Integrated (01/08/2022)   Social Connection and Isolation Panel [NHANES]    Frequency of Communication with Friends and Family: Three times a week    Frequency of Social Gatherings with Friends and Family: More than three times a week    Attends Religious Services: More than 4 times per year    Active Member of [employer] or Organizations: No    Attends Banker Meetings: Never  Marital Status: Married  Catering manager Violence: Not At Risk (01/08/2022)   Humiliation, Afraid, Rape, and Kick questionnaire    Fear of Current or Ex-Partner: No    Emotionally Abused: No    Physically Abused: No    Sexually Abused: No     BP (!) 162/92   Pulse 69   Ht 6' (1.829 m)   Wt 194 lb (88 kg)   SpO2 97%   BMI 26.31 kg/m   Physical Exam:  Well appearing NAD HEENT: Unremarkable Neck:  No JVD, no thyromegally Lymphatics:  No adenopathy Back:  No CVA tenderness Lungs:  Clear HEART:  Regular rate rhythm, no murmurs, no rubs, no clicks Abd:  soft, positive bowel sounds, no organomegally, no rebound, no guarding Ext:  2 plus pulses, no edema, no cyanosis, no clubbing Skin:  No rashes no nodules Neuro:  CN II through XII intact, motor grossly intact  EKG  DEVICE  Normal device function.  See PaceArt for details.   Assess/Plan: 1. Ventricular ectopy - he will continue the flecainide. It appears that the PVC's are well controlled. 2. HTN - his bp is fairly well controlled at home and he c/o orthostasis. I have asked him to reduce his losartan as needed but for now he will keep it the same.   Sharlot Gowda Mellie Buccellato,MD

## 2023-06-30 NOTE — Patient Instructions (Signed)

## 2023-07-08 ENCOUNTER — Other Ambulatory Visit: Payer: Self-pay | Admitting: Family Medicine

## 2023-07-08 DIAGNOSIS — Z8673 Personal history of transient ischemic attack (TIA), and cerebral infarction without residual deficits: Secondary | ICD-10-CM

## 2023-07-08 DIAGNOSIS — I700 Atherosclerosis of aorta: Secondary | ICD-10-CM

## 2023-07-22 ENCOUNTER — Ambulatory Visit: Payer: Medicare Other | Admitting: Family Medicine

## 2023-07-23 ENCOUNTER — Telehealth: Payer: Self-pay | Admitting: Family Medicine

## 2023-07-23 DIAGNOSIS — G8929 Other chronic pain: Secondary | ICD-10-CM

## 2023-07-24 NOTE — Telephone Encounter (Signed)
Requesting: tramadol 50mg   Contract: None UDS: None Last Visit: 03/19/23 Next Visit: 07/28/23 Last Refill: 12/31/22 #90 and 1RF   Please Advise

## 2023-07-28 ENCOUNTER — Encounter: Payer: Self-pay | Admitting: Family Medicine

## 2023-07-28 ENCOUNTER — Ambulatory Visit (INDEPENDENT_AMBULATORY_CARE_PROVIDER_SITE_OTHER): Payer: Medicare Other | Admitting: Family Medicine

## 2023-07-28 VITALS — BP 138/72 | HR 77 | Temp 99.3°F | Ht 72.0 in | Wt 192.2 lb

## 2023-07-28 DIAGNOSIS — M5416 Radiculopathy, lumbar region: Secondary | ICD-10-CM | POA: Diagnosis not present

## 2023-07-28 DIAGNOSIS — Z23 Encounter for immunization: Secondary | ICD-10-CM | POA: Diagnosis not present

## 2023-07-28 DIAGNOSIS — I700 Atherosclerosis of aorta: Secondary | ICD-10-CM

## 2023-07-28 DIAGNOSIS — K219 Gastro-esophageal reflux disease without esophagitis: Secondary | ICD-10-CM

## 2023-07-28 DIAGNOSIS — H8110 Benign paroxysmal vertigo, unspecified ear: Secondary | ICD-10-CM

## 2023-07-28 DIAGNOSIS — H9191 Unspecified hearing loss, right ear: Secondary | ICD-10-CM

## 2023-07-28 LAB — COMPREHENSIVE METABOLIC PANEL
ALT: 14 U/L (ref 0–53)
AST: 15 U/L (ref 0–37)
Albumin: 3.9 g/dL (ref 3.5–5.2)
Alkaline Phosphatase: 115 U/L (ref 39–117)
BUN: 29 mg/dL — ABNORMAL HIGH (ref 6–23)
CO2: 28 mEq/L (ref 19–32)
Calcium: 9.1 mg/dL (ref 8.4–10.5)
Chloride: 105 mEq/L (ref 96–112)
Creatinine, Ser: 1.82 mg/dL — ABNORMAL HIGH (ref 0.40–1.50)
GFR: 33.53 mL/min — ABNORMAL LOW (ref >60.00)
Glucose, Bld: 111 mg/dL — ABNORMAL HIGH (ref 70–99)
Potassium: 4.4 mEq/L (ref 3.5–5.1)
Sodium: 141 mEq/L (ref 135–145)
Total Bilirubin: 0.7 mg/dL (ref 0.2–1.2)
Total Protein: 6.5 g/dL (ref 6.0–8.3)

## 2023-07-28 LAB — LIPID PANEL
Cholesterol: 134 mg/dL (ref 0–200)
HDL: 36.3 mg/dL — ABNORMAL LOW (ref >39.00)
LDL Cholesterol: 73 mg/dL (ref 0–99)
NonHDL: 98.09
Total CHOL/HDL Ratio: 4
Triglycerides: 126 mg/dL (ref 0.0–149.0)
VLDL: 25.2 mg/dL (ref 0.0–40.0)

## 2023-07-28 NOTE — Progress Notes (Signed)
Chief Complaint  Patient presents with   Follow-up    vertigo    Subjective: Hyperlipidemia Patient presents for Hyperlipidemia follow up. Currently taking Lipitor 10 mg/d and compliance with treatment thus far has been good. He denies myalgias. He is adhering to a healthy diet. Exercise: active in yard No CP or SOB.  The patient is not known to have coexisting coronary artery disease.  GERD Takes Protonix 40 mg daily as needed. Takes it rarely, s/s's controlled.   Chronic pain Has a spinal stimulator . Takes tramadol 50 mg 1-2 times daily. Ends up taking it 1-2 times per week. No AE's. Usually pain is worsened when he works for periods of time and is relieved when he sits down.   Patient has been having worsening dizziness over the past few weeks.  He has a history of BPPV.  He has been doing the Epley maneuvers at home without significant relief.  He does use meclizine as needed which does help.  Interestingly it does not give him any side effects or drowsiness.  He is interested in seeing the vestibular rehab team again.  The patient has a history of steady hearing loss on the right side.  He is wondering if he needs hearing aids.  The patient is interested in seeing an audiologist for a formal evaluation.  There is no pain or drainage.  He does not use Q-tips.  Past Medical History:  Diagnosis Date   Abdominal aortic atherosclerosis (HCC) 04/15/2018   ANEMIA-IRON DEFICIENCY 06/23/2007   B12 deficiency 06/13/2020   BPH (benign prostatic hyperplasia) 06/23/2007   Qualifier: Diagnosis of  By: Marcelyn Ditty, RN, Katy Fitch   Formatting of this note might be different from the original. Formatting of this note might be different from the original. Qualifier: Diagnosis of  By: Marcelyn Ditty, RN, Katy Fitch   Degeneration of lumbar intervertebral disc 06/30/2018   Diverticulosis    Elevated diaphragm 04/15/2018   Essential hypertension 04/15/2018   Gastro-esophageal reflux disease without esophagitis  06/23/2007   Qualifier: Diagnosis of  By: Marcelyn Ditty, RN, Katy Fitch   Formatting of this note might be different from the original. Formatting of this note might be different from the original. Qualifier: Diagnosis of  By: Marcelyn Ditty, RN, Katy Fitch   Lumbar radiculopathy 05/06/2019   Lumbosacral spondylosis without myelopathy 03/10/2019   Monoclonal gammopathy of unknown significance (MGUS) 08/24/2018   Multiple renal cysts 04/06/2018   Nonsustained ventricular tachycardia (HCC) 11/03/2019   Primary osteoarthritis of both knees 06/30/2018   Bilateral moderate with moderate chondromalacia patella Formatting of this note might be different from the original. Formatting of this note might be different from the original. Bilateral moderate with moderate chondromalacia patella   Stage 3b chronic kidney disease (HCC) 06/21/2020    Objective: BP 138/72 (BP Location: Left Arm, Cuff Size: Normal)   Pulse 77   Temp 99.3 F (37.4 C) (Oral)   Ht 6' (1.829 m)   Wt 192 lb 4 oz (87.2 kg)   SpO2 99%   BMI 26.07 kg/m  General: Awake, appears stated age HEENT: MMM, canals are patent without otorrhea or excessive cerumen, TMs negative bilaterally Heart: RRR, no LE edema, no bruits Lungs: CTAB, no rales, wheezes or rhonchi. No accessory muscle use Psych: Age appropriate judgment and insight, normal affect and mood  Assessment and Plan: Abdominal aortic atherosclerosis (HCC)  Gastroesophageal reflux disease, unspecified whether esophagitis present  Lumbar radiculopathy  Benign paroxysmal positional vertigo, unspecified laterality - Plan: Ambulatory referral to Physical  Therapy  Hearing loss of right ear, unspecified hearing loss type - Plan: Ambulatory referral to Audiology  Chronic, stable.  Counseled on diet and exercise.  Continue Lipitor 10 mg daily. Chronic, stable.  Continue Protonix 40 mg daily as needed. Chronic, stable.  Continue spinal cord stimulator, tramadol 50 mg daily as needed.  Heat, ice,  Tylenol. Refer back to vestibular rehab for further evaluation.  Continue doing home Epley maneuvers in the meanwhile. Refer to audiology for formal evaluation.  No obvious cause identified today. The patient voiced understanding and agreement to the plan.  Jilda Roche Huckabay, DO 07/28/23  1:44 PM

## 2023-07-28 NOTE — Patient Instructions (Signed)
Give [place] 2-3 business days to get the results of your labs back.   Keep the diet clean and stay active.  If you do not hear anything about your referral in the next 1-2 weeks, call our office and ask for an update.  Let us know if you need anything.

## 2023-07-29 DIAGNOSIS — H25813 Combined forms of age-related cataract, bilateral: Secondary | ICD-10-CM | POA: Diagnosis not present

## 2023-07-29 NOTE — Telephone Encounter (Signed)
I have not seen a PA and we do not do them in our office any more, but I still will see the faxed.

## 2023-07-29 NOTE — Telephone Encounter (Signed)
Did we receive a PA? I sent it 5 d ago?

## 2023-07-29 NOTE — Telephone Encounter (Signed)
Pt's wife states ins only approved for 7 pills and needs a PA before they are able to obtain a 90 day supply.

## 2023-07-29 NOTE — Telephone Encounter (Signed)
Sent a Radio broadcast assistant

## 2023-07-30 ENCOUNTER — Other Ambulatory Visit (HOSPITAL_COMMUNITY): Payer: Self-pay

## 2023-08-12 ENCOUNTER — Ambulatory Visit: Payer: Medicare Other | Attending: Family Medicine

## 2023-08-12 DIAGNOSIS — R42 Dizziness and giddiness: Secondary | ICD-10-CM | POA: Diagnosis not present

## 2023-08-12 DIAGNOSIS — H8110 Benign paroxysmal vertigo, unspecified ear: Secondary | ICD-10-CM | POA: Diagnosis not present

## 2023-08-12 DIAGNOSIS — H8111 Benign paroxysmal vertigo, right ear: Secondary | ICD-10-CM | POA: Insufficient documentation

## 2023-08-12 DIAGNOSIS — H903 Sensorineural hearing loss, bilateral: Secondary | ICD-10-CM | POA: Insufficient documentation

## 2023-08-12 DIAGNOSIS — R2689 Other abnormalities of gait and mobility: Secondary | ICD-10-CM | POA: Insufficient documentation

## 2023-08-12 NOTE — Therapy (Addendum)
OUTPATIENT PHYSICAL THERAPY VESTIBULAR EVALUATION     Patient Name: Alex Dunn MRN: [id number] DOB:11-01-1936, 87 y.o., male Today's Date: 08/12/2023  END OF SESSION:  PT End of Session - 08/12/23 1541     Visit Number 1    Date for PT Re-Evaluation 11/04/23    Authorization Type BCBS Medicare    PT Start Time 1545    PT Stop Time 1630    PT Time Calculation (min) 45 min    Activity Tolerance Patient tolerated treatment well    Behavior During Therapy [facility] for tasks assessed/performed             Past Medical History:  Diagnosis Date   Abdominal aortic atherosclerosis (HCC) 04/15/2018   ANEMIA-IRON DEFICIENCY 06/23/2007   B12 deficiency 06/13/2020   BPH (benign prostatic hyperplasia) 06/23/2007   Qualifier: Diagnosis of  By: Marcelyn Ditty, RN, Katy Fitch   Formatting of this note might be different from the original. Formatting of this note might be different from the original. Qualifier: Diagnosis of  By: Marcelyn Ditty, RN, Katy Fitch   Degeneration of lumbar intervertebral disc 06/30/2018   Diverticulosis    Elevated diaphragm 04/15/2018   Essential hypertension 04/15/2018   Gastro-esophageal reflux disease without esophagitis 06/23/2007   Qualifier: Diagnosis of  By: Marcelyn Ditty, RN, Katy Fitch   Formatting of this note might be different from the original. Formatting of this note might be different from the original. Qualifier: Diagnosis of  By: Marcelyn Ditty, RN, Katy Fitch   Lumbar radiculopathy 05/06/2019   Lumbosacral spondylosis without myelopathy 03/10/2019   Monoclonal gammopathy of unknown significance (MGUS) 08/24/2018   Multiple renal cysts 04/06/2018   Nonsustained ventricular tachycardia (HCC) 11/03/2019   Primary osteoarthritis of both knees 06/30/2018   Bilateral moderate with moderate chondromalacia patella Formatting of this note might be different from the original. Formatting of this note might be different from the original. Bilateral moderate with moderate chondromalacia patella    Stage 3b chronic kidney disease (HCC) 06/21/2020   Past Surgical History:  Procedure Laterality Date   HAND SURGERY     LUNG SURGERY     for collapsed lung   SPINAL CORD STIMULATOR IMPLANT  01/24/2023   Patient Active Problem List   Diagnosis Date Noted   TIA (transient ischemic attack) 03/11/2023   Vertigo 03/09/2023   Dyslipidemia 03/09/2023   Orthostatic hypotension 03/09/2023   DNR (do not resuscitate) 03/09/2023   Cerebellar ataxia in diseases classified elsewhere (HCC) 12/07/2021   CIDP (chronic inflammatory demyelinating polyneuropathy) (HCC) 12/07/2021   History of CVA (cerebrovascular accident) 09/14/2021   Neuropathic pain 06/05/2021   PVC's (premature ventricular contractions) 03/01/2021   Polycystic kidney disease    MGUS (monoclonal gammopathy of unknown significance)    Diverticulosis    Stage 3b chronic kidney disease (HCC) 06/21/2020   B12 deficiency 06/13/2020   Ataxia 02/14/2020   Bilateral leg pain 02/14/2020   Weakness of both lower extremities 01/10/2020   Balance problem 01/10/2020   Nonsustained ventricular tachycardia (HCC) 11/03/2019   Lumbar radiculopathy 05/06/2019   Lumbosacral spondylosis without myelopathy 03/10/2019   Low back pain 03/09/2019   Monoclonal gammopathy of unknown significance (MGUS) 08/24/2018   Primary osteoarthritis of both knees 06/30/2018   Degeneration of lumbar intervertebral disc 06/30/2018   Essential hypertension 04/15/2018   Diverticular disease 04/15/2018   Abdominal aortic atherosclerosis (HCC) 04/15/2018   Elevated diaphragm 04/15/2018   Multiple renal cysts 04/06/2018   Night sweats 03/13/2018   Weakness 03/13/2018   Chronic kidney disease  10/18/2016   ANEMIA-IRON DEFICIENCY 06/23/2007   Gastro-esophageal reflux disease without esophagitis 06/23/2007   BPH (benign prostatic hyperplasia) 06/23/2007   GERD 06/23/2007    PCP: Arva Chafe REFERRING PROVIDER: Arva Chafe  REFERRING DIAG:  BPPV  THERAPY DIAG:  BPPV (benign paroxysmal positional vertigo), right  Dizziness and giddiness  Other abnormalities of gait and mobility  ONSET DATE: 03/09/23  Rationale for Evaluation and Treatment: Rehabilitation  SUBJECTIVE:   SUBJECTIVE STATEMENT: The vertigo comes and goes. I had a real bad case of it back in May. If I move very fast or when I turn over in bed I get a dizzy feeling and then it goes away. I don't walk really well because I have neuropathy. I also have a spinal stimulator that helps with the pain. My balance issues are mostly coming from my fet and legs.   Pt accompanied by: significant other  PERTINENT HISTORY: 03/25/23- Pt arrived for PT evaluation accompanied by his wife. Pt reports he had spinal stimulator implanted 8 weeks ago; went to [facility] ED on 03-09-23 with onset of vertigo. Pt was admitted and discharged on 03-13-23.  During the hospitalization pt was treated for the vertigo with Epley maneuver; pt states the treatment irritated the location of the spinal stimulator implant (on his Lt lateral side). Pt reports the vertigo is much improved at this time - reports he feels 90-95% better.  He reports he has an appt with his doctor on Wed., May 22 and would like to hold on PT at this time until he discusses this issue of discomfort/ tenderness at site of spinal cord stimulator implant with his MD.  Pt declines positional testing for vertigo at this time due to fear of further irritation of spinal stimulator implant - "wants to sure it is still positioned correctly".  07/28/23- Patient has been having worsening dizziness over the past few weeks.  He has a history of BPPV.  He has been doing the Epley maneuvers at home without significant relief.  He does use meclizine as needed which does help.  Interestingly it does not give him any side effects or drowsiness.  He is interested in seeing the vestibular rehab team again.   PAIN:  Are you having pain? No  PRECAUTIONS:  None  RED FLAGS: None   WEIGHT BEARING RESTRICTIONS: No  FALLS: Has patient fallen in last 6 months? No  LIVING ENVIRONMENT: Lives with: lives with their spouse Lives in: House/apartment Stairs: Yes: External: 2 steps; on right going up Has following equipment at home: None  PLOF: Independent  PATIENT GOALS: fix the dizziness   OBJECTIVE:  Note: Objective measures were completed at Evaluation unless otherwise noted.   SENSATION: Light touch: Impaired  neuropathy in both feet    POSTURE:  rounded shoulders and forward head  Cervical ROM:    Active A/PROM (deg) eval  Flexion WNL  Extension Limited 50% with some dizziness  Right rotation A little dizzy  Left rotation Limited 50%  Right lateral flexion Very limited  Left lateral flexion Very limited  (Blank rows = not tested)   LOWER EXTREMITY MMT: 5/5 strength bilaterally   GAIT: Gait pattern: decreased arm swing- Right, decreased arm swing- Left, decreased stride length, narrow BOS, poor foot clearance- Right, and poor foot clearance- Left Distance walked: in clinic distances Assistive device utilized: None Level of assistance: Complete Independence Comments: does staggering some and veers to the right    VESTIBULAR ASSESSMENT:   SYMPTOM BEHAVIOR:  Non-Vestibular symptoms:  changes in vision and tinnitus  Type of dizziness: Imbalance (Disequilibrium), Spinning/Vertigo, and Unsteady with head/body turns  Frequency: every day  Duration: a few mins   Aggravating factors: Induced by position change: rolling to the right and Induced by motion: turning body quickly and turning head quickly  Relieving factors: head stationary, lying supine, rest, and slow movements  Progression of symptoms: better  OCULOMOTOR EXAM:  Ocular Alignment: normal  Ocular ROM: No Limitations  Spontaneous Nystagmus: absent  Gaze-Induced Nystagmus: absent  Smooth Pursuits: intact  Saccades: intact   VESTIBULAR - OCULAR REFLEX:    Slow VOR: Normal  VOR Cancellation: Normal   OTHOSTATICS: Sitting: 163/84 Standing: 145/79  FUNCTIONAL GAIT: 5 times sit to stand: 28.34s Timed up and go (TUG): 12.51s Functional gait assessment: 18/30   VESTIBULAR TREATMENT:                                                                                                   DATE: EVAL 08/12/23   PATIENT EDUCATION: Education details: POC and HEP Person educated: Patient Education method: Explanation Education comprehension: verbalized understanding  HOME EXERCISE PROGRAM: Access Code: [facility] URL: [email or web address] Date: 08/12/2023 Prepared by: Cassie Freer  Exercises - Seated Gaze Stabilization with Head Rotation  - 1 x daily - 7 x weekly - 2 sets - 10 reps - Seated Gaze Stabilization with Head Nod  - 1 x daily - 7 x weekly - 2 sets - 10 reps - Seated VOR Cancellation  - 1 x daily - 7 x weekly - 2 sets - 10 reps   GOALS: Goals reviewed with patient? Yes  SHORT TERM GOALS: Target date: 09/23/23  Patient will be independent with initial HEP. Goal status: INITIAL  2.  Patient will demonstrate improved 5xSTS to <15s. Baseline: 28.34s w/UE push off Goal status: INITIAL   LONG TERM GOALS: Target date: 11/04/23  Patient will be independent with advanced/ongoing HEP to improve outcomes and carryover.  Goal status: INITIAL  2.  Patient will be able to turn over to the right side without dizzy  Baseline: dizzy when he turns to the R  Goal status: INITIAL  3.  Patient will be able to make quick turns without dizziness Baseline: dizzy with quick movements Goal status: INITIAL   4.  Patient will demonstrate at least  25/30 on FGA to improve gait stability and reduce risk for falls. Baseline: 181/30 Goal status: INITIAL  ASSESSMENT:  CLINICAL IMPRESSION: Patient is a 87 y.o. male who was seen today for physical therapy evaluation and treatment for BPPV. He reports after 4-5 weeks after his hospital  stint he was back to normal and did not need PT at the time. However, about a few week ago he noticed it start to come back again. He reports dizziness with turning in bed to the right, quick turns, and sometimes when he changes positions from laying or sitting to standing. Checked his BP to see if he was orthostatic from sitting position to standing. His BP was high today at 163/84 sitting and then 145/79 standing but not quite orthostatic. He presents  as a fall risk with FGA done today. He also has limited neck ROM and tightness along upper traps. Patient will benefit from skilled PT to address his BPPV symptoms and work on his overall gait and balance to decrease his risk for falls.   OBJECTIVE IMPAIRMENTS: Abnormal gait, decreased balance, difficulty walking, and dizziness.   ACTIVITY LIMITATIONS: lifting, bending, and locomotion level  PARTICIPATION LIMITATIONS: driving, shopping, community activity, and yard work  PERSONAL FACTORS: Age and Past/current experiences are also affecting patient's functional outcome.   REHAB POTENTIAL: Good  CLINICAL DECISION MAKING: Stable/uncomplicated  EVALUATION COMPLEXITY: Low   PLAN:  PT FREQUENCY: 1-2x/week  PT DURATION: 12 weeks  PLANNED INTERVENTIONS: Therapeutic exercises, Therapeutic activity, Neuromuscular re-education, Balance training, Gait training, Patient/Family education, Self Care, Joint mobilization, Stair training, Vestibular training, Canalith repositioning, Dry Needling, Electrical stimulation, Spinal manipulation, Spinal mobilization, Moist heat, and Manual therapy  PLAN FOR NEXT SESSION: retest orthostatic laying down, balance and vestibular training    Cassie Freer, PT 08/12/2023, 4:39 PM

## 2023-08-13 NOTE — Addendum Note (Signed)
Addended by: Jearld Lesch on: 08/13/2023 11:58 AM   Modules accepted: Orders

## 2023-08-13 NOTE — Addendum Note (Signed)
Addended by: Jearld Lesch on: 08/13/2023 12:40 PM   Modules accepted: Orders

## 2023-08-13 NOTE — Addendum Note (Signed)
Addended by: Roselind Messier on: 08/13/2023 12:06 PM   Modules accepted: Orders

## 2023-08-14 NOTE — Addendum Note (Signed)
Addended by: Jearld Lesch on: 08/14/2023 02:40 PM   Modules accepted: Orders

## 2023-08-19 ENCOUNTER — Ambulatory Visit: Payer: Medicare Other | Admitting: Physical Therapy

## 2023-08-19 ENCOUNTER — Encounter: Payer: Self-pay | Admitting: Physical Therapy

## 2023-08-19 DIAGNOSIS — H8110 Benign paroxysmal vertigo, unspecified ear: Secondary | ICD-10-CM | POA: Diagnosis not present

## 2023-08-19 DIAGNOSIS — H903 Sensorineural hearing loss, bilateral: Secondary | ICD-10-CM | POA: Diagnosis not present

## 2023-08-19 DIAGNOSIS — H8111 Benign paroxysmal vertigo, right ear: Secondary | ICD-10-CM

## 2023-08-19 DIAGNOSIS — R2689 Other abnormalities of gait and mobility: Secondary | ICD-10-CM

## 2023-08-19 DIAGNOSIS — R42 Dizziness and giddiness: Secondary | ICD-10-CM | POA: Diagnosis not present

## 2023-08-19 NOTE — Therapy (Signed)
OUTPATIENT PHYSICAL THERAPY VESTIBULAR EVALUATION     Patient Name: Alex Dunn MRN: [id number] DOB:1935/12/04, 87 y.o., male Today's Date: 08/19/2023  END OF SESSION:  PT End of Session - 08/19/23 1436     Visit Number 2    Date for PT Re-Evaluation 11/04/23    Authorization Type BCBS Medicare    PT Start Time 1356    PT Stop Time 1436    PT Time Calculation (min) 40 min    Activity Tolerance Patient tolerated treatment well    Behavior During Therapy [facility] for tasks assessed/performed             Past Medical History:  Diagnosis Date   Abdominal aortic atherosclerosis (HCC) 04/15/2018   ANEMIA-IRON DEFICIENCY 06/23/2007   B12 deficiency 06/13/2020   BPH (benign prostatic hyperplasia) 06/23/2007   Qualifier: Diagnosis of  By: Marcelyn Ditty, RN, Katy Fitch   Formatting of this note might be different from the original. Formatting of this note might be different from the original. Qualifier: Diagnosis of  By: Marcelyn Ditty, RN, Katy Fitch   Degeneration of lumbar intervertebral disc 06/30/2018   Diverticulosis    Elevated diaphragm 04/15/2018   Essential hypertension 04/15/2018   Gastro-esophageal reflux disease without esophagitis 06/23/2007   Qualifier: Diagnosis of  By: Marcelyn Ditty, RN, Katy Fitch   Formatting of this note might be different from the original. Formatting of this note might be different from the original. Qualifier: Diagnosis of  By: Marcelyn Ditty, RN, Katy Fitch   Lumbar radiculopathy 05/06/2019   Lumbosacral spondylosis without myelopathy 03/10/2019   Monoclonal gammopathy of unknown significance (MGUS) 08/24/2018   Multiple renal cysts 04/06/2018   Nonsustained ventricular tachycardia (HCC) 11/03/2019   Primary osteoarthritis of both knees 06/30/2018   Bilateral moderate with moderate chondromalacia patella Formatting of this note might be different from the original. Formatting of this note might be different from the original. Bilateral moderate with moderate chondromalacia patella    Stage 3b chronic kidney disease (HCC) 06/21/2020   Past Surgical History:  Procedure Laterality Date   HAND SURGERY     LUNG SURGERY     for collapsed lung   SPINAL CORD STIMULATOR IMPLANT  01/24/2023   Patient Active Problem List   Diagnosis Date Noted   TIA (transient ischemic attack) 03/11/2023   Vertigo 03/09/2023   Dyslipidemia 03/09/2023   Orthostatic hypotension 03/09/2023   DNR (do not resuscitate) 03/09/2023   Cerebellar ataxia in diseases classified elsewhere (HCC) 12/07/2021   CIDP (chronic inflammatory demyelinating polyneuropathy) (HCC) 12/07/2021   History of CVA (cerebrovascular accident) 09/14/2021   Neuropathic pain 06/05/2021   PVC's (premature ventricular contractions) 03/01/2021   Polycystic kidney disease    MGUS (monoclonal gammopathy of unknown significance)    Diverticulosis    Stage 3b chronic kidney disease (HCC) 06/21/2020   B12 deficiency 06/13/2020   Ataxia 02/14/2020   Bilateral leg pain 02/14/2020   Weakness of both lower extremities 01/10/2020   Balance problem 01/10/2020   Nonsustained ventricular tachycardia (HCC) 11/03/2019   Lumbar radiculopathy 05/06/2019   Lumbosacral spondylosis without myelopathy 03/10/2019   Low back pain 03/09/2019   Monoclonal gammopathy of unknown significance (MGUS) 08/24/2018   Primary osteoarthritis of both knees 06/30/2018   Degeneration of lumbar intervertebral disc 06/30/2018   Essential hypertension 04/15/2018   Diverticular disease 04/15/2018   Abdominal aortic atherosclerosis (HCC) 04/15/2018   Elevated diaphragm 04/15/2018   Multiple renal cysts 04/06/2018   Night sweats 03/13/2018   Weakness 03/13/2018   Chronic kidney disease  10/18/2016   ANEMIA-IRON DEFICIENCY 06/23/2007   Gastro-esophageal reflux disease without esophagitis 06/23/2007   BPH (benign prostatic hyperplasia) 06/23/2007   GERD 06/23/2007    PCP: Arva Chafe REFERRING PROVIDER: Arva Chafe  REFERRING DIAG:  BPPV  THERAPY DIAG:  BPPV (benign paroxysmal positional vertigo), right  Dizziness and giddiness  Other abnormalities of gait and mobility  ONSET DATE: 03/09/23  Rationale for Evaluation and Treatment: Rehabilitation  SUBJECTIVE:   SUBJECTIVE STATEMENT: The vertigo comes and goes. Not bad but not feeling fully right, does report onset of right ear hearing loss, prior to the vertigo onset  Pt accompanied by: significant other  PERTINENT HISTORY: 03/25/23- Pt arrived for PT evaluation accompanied by his wife. Pt reports he had spinal stimulator implanted 8 weeks ago; went to [facility] ED on 03-09-23 with onset of vertigo. Pt was admitted and discharged on 03-13-23.  During the hospitalization pt was treated for the vertigo with Epley maneuver; pt states the treatment irritated the location of the spinal stimulator implant (on his Lt lateral side). Pt reports the vertigo is much improved at this time - reports he feels 90-95% better.  He reports he has an appt with his doctor on Wed., May 22 and would like to hold on PT at this time until he discusses this issue of discomfort/ tenderness at site of spinal cord stimulator implant with his MD.  Pt declines positional testing for vertigo at this time due to fear of further irritation of spinal stimulator implant - "wants to sure it is still positioned correctly".  07/28/23- Patient has been having worsening dizziness over the past few weeks.  He has a history of BPPV.  He has been doing the Epley maneuvers at home without significant relief.  He does use meclizine as needed which does help.  Interestingly it does not give him any side effects or drowsiness.  He is interested in seeing the vestibular rehab team again.   PAIN:  Are you having pain? No  PRECAUTIONS: None  RED FLAGS: None   WEIGHT BEARING RESTRICTIONS: No  FALLS: Has patient fallen in last 6 months? No  LIVING ENVIRONMENT: Lives with: lives with their spouse Lives in:  House/apartment Stairs: Yes: External: 2 steps; on right going up Has following equipment at home: None  PLOF: Independent  PATIENT GOALS: fix the dizziness   OBJECTIVE:  Note: Objective measures were completed at Evaluation unless otherwise noted.   SENSATION: Light touch: Impaired  neuropathy in both feet    POSTURE:  rounded shoulders and forward head  Cervical ROM:    Active A/PROM (deg) eval  Flexion WNL  Extension Limited 50% with some dizziness  Right rotation A little dizzy  Left rotation Limited 50%  Right lateral flexion Very limited  Left lateral flexion Very limited  (Blank rows = not tested)   LOWER EXTREMITY MMT: 5/5 strength bilaterally   GAIT: Gait pattern: decreased arm swing- Right, decreased arm swing- Left, decreased stride length, narrow BOS, poor foot clearance- Right, and poor foot clearance- Left Distance walked: in clinic distances Assistive device utilized: None Level of assistance: Complete Independence Comments: does staggering some and veers to the right    VESTIBULAR ASSESSMENT:   SYMPTOM BEHAVIOR:  Non-Vestibular symptoms: changes in vision and tinnitus  Type of dizziness: Imbalance (Disequilibrium), Spinning/Vertigo, and Unsteady with head/body turns  Frequency: every day  Duration: a few mins   Aggravating factors: Induced by position change: rolling to the right and Induced by motion: turning body quickly  and turning head quickly  Relieving factors: head stationary, lying supine, rest, and slow movements  Progression of symptoms: better  OCULOMOTOR EXAM:  Ocular Alignment: normal  Ocular ROM: No Limitations  Spontaneous Nystagmus: absent  Gaze-Induced Nystagmus: absent  Smooth Pursuits: intact  Saccades: intact   VESTIBULAR - OCULAR REFLEX:   Slow VOR: Normal  VOR Cancellation: Normal   OTHOSTATICS: Sitting: 163/84 Standing: 145/79  FUNCTIONAL GAIT: 5 times sit to stand: 28.34s Timed up and go (TUG):  12.51s Functional gait assessment: 18/30   VESTIBULAR TREATMENT:                                                                                                   DATE:  08/19/23 Negative dix hal[ike bilaterally Talked about Dx of VOR, vs Vertigo and described the differences b/n possible posterior and horizontal canal Issued VOR HEP and went over  EVAL 08/12/23   PATIENT EDUCATION: Education details: POC and HEP Person educated: Patient Education method: Explanation Education comprehension: verbalized understanding  HOME EXERCISE PROGRAM: Access Code: [facility] URL: [email or web address] Date: 08/12/2023 Prepared by: Cassie Freer  Exercises - Seated Gaze Stabilization with Head Rotation  - 1 x daily - 7 x weekly - 2 sets - 10 reps - Seated Gaze Stabilization with Head Nod  - 1 x daily - 7 x weekly - 2 sets - 10 reps - Seated VOR Cancellation  - 1 x daily - 7 x weekly - 2 sets - 10 reps   GOALS: Goals reviewed with patient? Yes  SHORT TERM GOALS: Target date: 09/23/23  Patient will be independent with initial HEP. Goal status: ongoing 08/19/23  2.  Patient will demonstrate improved 5xSTS to <15s. Baseline: 28.34s w/UE push off Goal status: INITIAL   LONG TERM GOALS: Target date: 11/04/23  Patient will be independent with advanced/ongoing HEP to improve outcomes and carryover.  Goal status: INITIAL  2.  Patient will be able to turn over to the right side without dizzy  Baseline: dizzy when he turns to the R  Goal status: INITIAL  3.  Patient will be able to make quick turns without dizziness Baseline: dizzy with quick movements Goal status: INITIAL   4.  Patient will demonstrate at least  25/30 on FGA to improve gait stability and reduce risk for falls. Baseline: 181/30 Goal status: INITIAL  ASSESSMENT:  CLINICAL IMPRESSION: Patient is a 87 y.o. male who was seen today for physical therapy evaluation and treatment for BPPV. He reports after  4-5 weeks after his hospital stint he was back to normal and did not need PT at the time. Dix-Halpike negative bilaterally today.  Issued VOR HEP and went over with him.  He will get an appointment with audiologist,  OBJECTIVE IMPAIRMENTS: Abnormal gait, decreased balance, difficulty walking, and dizziness.   ACTIVITY LIMITATIONS: lifting, bending, and locomotion level  PARTICIPATION LIMITATIONS: driving, shopping, community activity, and yard work  PERSONAL FACTORS: Age and Past/current experiences are also affecting patient's functional outcome.   REHAB POTENTIAL: Good  CLINICAL DECISION MAKING: Stable/uncomplicated  EVALUATION COMPLEXITY: Low   PLAN:  PT FREQUENCY:  1-2x/week  PT DURATION: 12 weeks  PLANNED INTERVENTIONS: Therapeutic exercises, Therapeutic activity, Neuromuscular re-education, Balance training, Gait training, Patient/Family education, Self Care, Joint mobilization, Stair training, Vestibular training, Canalith repositioning, Dry Needling, Electrical stimulation, Spinal manipulation, Spinal mobilization, Moist heat, and Manual therapy  PLAN FOR NEXT SESSION: He will see audiologist, if he elects to come back I feel like we need to look at the horizontal canal   Jearld Lesch, PT 08/19/2023, 2:38 PM

## 2023-08-20 ENCOUNTER — Other Ambulatory Visit: Payer: Self-pay | Admitting: Internal Medicine

## 2023-08-20 DIAGNOSIS — N2581 Secondary hyperparathyroidism of renal origin: Secondary | ICD-10-CM | POA: Diagnosis not present

## 2023-08-20 DIAGNOSIS — Q619 Cystic kidney disease, unspecified: Secondary | ICD-10-CM | POA: Diagnosis not present

## 2023-08-20 DIAGNOSIS — N1832 Chronic kidney disease, stage 3b: Secondary | ICD-10-CM | POA: Diagnosis not present

## 2023-08-20 DIAGNOSIS — I129 Hypertensive chronic kidney disease with stage 1 through stage 4 chronic kidney disease, or unspecified chronic kidney disease: Secondary | ICD-10-CM | POA: Diagnosis not present

## 2023-08-25 ENCOUNTER — Other Ambulatory Visit: Payer: Medicare Other

## 2023-08-25 ENCOUNTER — Encounter: Payer: Self-pay | Admitting: Medical Oncology

## 2023-08-25 ENCOUNTER — Inpatient Hospital Stay (HOSPITAL_BASED_OUTPATIENT_CLINIC_OR_DEPARTMENT_OTHER): Payer: Medicare Other | Admitting: Medical Oncology

## 2023-08-25 ENCOUNTER — Inpatient Hospital Stay: Payer: Medicare Other | Attending: Hematology & Oncology

## 2023-08-25 ENCOUNTER — Ambulatory Visit: Payer: Medicare Other | Admitting: Medical Oncology

## 2023-08-25 VITALS — BP 152/65 | HR 64 | Temp 98.4°F | Resp 20 | Ht 72.0 in | Wt 192.0 lb

## 2023-08-25 DIAGNOSIS — D472 Monoclonal gammopathy: Secondary | ICD-10-CM | POA: Diagnosis not present

## 2023-08-25 DIAGNOSIS — G629 Polyneuropathy, unspecified: Secondary | ICD-10-CM | POA: Insufficient documentation

## 2023-08-25 LAB — CMP (CANCER CENTER ONLY)
ALT: 13 U/L (ref 0–44)
AST: 15 U/L (ref 15–41)
Albumin: 4.2 g/dL (ref 3.5–5.0)
Alkaline Phosphatase: 101 U/L (ref 38–126)
Anion gap: 6 (ref 5–15)
BUN: 24 mg/dL — ABNORMAL HIGH (ref 8–23)
CO2: 29 mmol/L (ref 22–32)
Calcium: 9 mg/dL (ref 8.9–10.3)
Chloride: 106 mmol/L (ref 98–111)
Creatinine: 1.92 mg/dL — ABNORMAL HIGH (ref 0.61–1.24)
GFR, Estimated: 34 mL/min — ABNORMAL LOW (ref >60)
Glucose, Bld: 94 mg/dL (ref 70–99)
Potassium: 4.7 mmol/L (ref 3.5–5.1)
Sodium: 141 mmol/L (ref 135–145)
Total Bilirubin: 0.7 mg/dL (ref 0.3–1.2)
Total Protein: 6.6 g/dL (ref 6.5–8.1)

## 2023-08-25 LAB — CBC WITH DIFFERENTIAL (CANCER CENTER ONLY)
Abs Immature Granulocytes: 0.01 10*3/uL (ref 0.00–0.07)
Basophils Absolute: 0 10*3/uL (ref 0.0–0.1)
Basophils Relative: 1 %
Eosinophils Absolute: 0.1 10*3/uL (ref 0.0–0.5)
Eosinophils Relative: 2 %
HCT: 41.7 % (ref 39.0–52.0)
Hemoglobin: 13.7 g/dL (ref 13.0–17.0)
Immature Granulocytes: 0 %
Lymphocytes Relative: 28 %
Lymphs Abs: 1.5 10*3/uL (ref 0.7–4.0)
MCH: 31.9 pg (ref 26.0–34.0)
MCHC: 32.9 g/dL (ref 30.0–36.0)
MCV: 97 fL (ref 80.0–100.0)
Monocytes Absolute: 0.5 10*3/uL (ref 0.1–1.0)
Monocytes Relative: 9 %
Neutro Abs: 3.2 10*3/uL (ref 1.7–7.7)
Neutrophils Relative %: 60 %
Platelet Count: 156 10*3/uL (ref 150–400)
RBC: 4.3 MIL/uL (ref 4.22–5.81)
RDW: 13.2 % (ref 11.5–15.5)
WBC Count: 5.3 10*3/uL (ref 4.0–10.5)
nRBC: 0 % (ref 0.0–0.2)

## 2023-08-25 LAB — LACTATE DEHYDROGENASE: LDH: 118 U/L (ref 98–192)

## 2023-08-25 NOTE — Progress Notes (Signed)
BP remains elevated, 152/69, Instructed to monitor at home and notify PCP if it remains elevated, verbalized understanding.

## 2023-08-25 NOTE — Progress Notes (Signed)
Hematology and Oncology Follow Up Visit  Alex Dunn [id number] 1936-06-28 87 y.o. 08/25/2023   Principle Diagnosis:  IgA kappa MGUS   Current Therapy:        Observation   Interim History:  Alex Dunn is here today for follow-up. He is here with his wife.   Overall he has been doing well. His spinal stimulator is still helping him with his neuropathy. Since his last visit he has been dealing with occasional vertigo. He is working with PT as well as Neuro for this.    When we last saw him back in April, there was no monoclonal spike in his blood.  His IgA level was 438 mg/dL.  His Kappa light chain was 5.5 mg/dL.  Of note, back in November and we did a 24-hour urine on him.  There is no monoclonal spike of light chain in his urine.  Of note, his last vitamin B12 level was 927.  He has had no issues with nausea or vomiting.  He has had no problems with cough or shortness of breath.  There is been no change in bowel or bladder habits.  He may have a little bit of constipation.  He has not had any problem with hot flashes or sweats.  Currently, I would say his performance status is probably ECOG 1.    Medications:  Allergies as of 08/25/2023       Reactions   Amitriptyline Other (See Comments)   SLEPT FOR 18-19 HOURS STRAIGHT   Codeine Phosphate Other (See Comments)   Seen odd things crawling up wall   Pregabalin Other (See Comments)   Drowsy, nightmares, stumbling   Tamsulosin Other (See Comments)   Faint feeling   Tape Other (See Comments)   Skin irritation   Lexapro [escitalopram Oxalate] Other (See Comments)   shaking        Medication List        Accurate as of August 25, 2023  1:40 PM. If you have any questions, ask your nurse or doctor.          STOP taking these medications    finasteride 5 MG tablet Commonly known as: Proscar Stopped by: Rushie Chestnut       TAKE these medications    acetaminophen 325 MG tablet Commonly known as:  TYLENOL Take 650 mg by mouth 4 (four) times daily as needed for mild pain or moderate pain (pain).   aspirin EC 81 MG tablet Take 81 mg by mouth daily. Swallow whole.   atorvastatin 10 MG tablet Commonly known as: LIPITOR TAKE 1 TABLET(10 MG) BY MOUTH DAILY   cyanocobalamin 1000 MCG tablet Take 1,000 mcg by mouth daily.   flecainide 50 MG tablet Commonly known as: TAMBOCOR TAKE 1 TABLET(50 MG) BY MOUTH TWICE DAILY   fluticasone 50 MCG/ACT nasal spray Commonly known as: FLONASE Place 2 sprays into both nostrils daily.   losartan 50 MG tablet Commonly known as: COZAAR TAKE 1 TABLET BY MOUTH ONCE DAILY   meclizine 25 MG tablet Commonly known as: ANTIVERT Take 1 tablet (25 mg total) by mouth 3 (three) times daily as needed for dizziness.   pantoprazole 40 MG tablet Commonly known as: PROTONIX Take 1 tablet (40 mg total) by mouth daily as needed. What changed: reasons to take this   PreserVision AREDS Caps Take 1 capsule by mouth daily.   traMADol 50 MG tablet Commonly known as: ULTRAM Take 1 tablet (50 mg total) by mouth daily as needed  for moderate pain or severe pain.   Vitamin D 50 MCG (2000 UT) Caps Take 2,000 Units by mouth daily at 6 (six) AM.        Allergies:  Allergies  Allergen Reactions   Amitriptyline Other (See Comments)    SLEPT FOR 18-19 HOURS STRAIGHT   Codeine Phosphate Other (See Comments)    Seen odd things crawling up wall   Pregabalin Other (See Comments)    Drowsy, nightmares, stumbling   Tamsulosin Other (See Comments)    Faint feeling    Tape Other (See Comments)    Skin irritation   Lexapro [Escitalopram Oxalate] Other (See Comments)    shaking    Past Medical History, Surgical history, Social history, and Family History were reviewed and updated.  Review of Systems: Review of Systems  Constitutional: Negative.   HENT: Negative.    Eyes: Negative.   Respiratory: Negative.    Cardiovascular: Negative.   Gastrointestinal:  Negative.   Genitourinary: Negative.   Musculoskeletal: Negative.   Skin: Negative.   Neurological:  Positive for tingling.  Endo/Heme/Allergies: Negative.   Psychiatric/Behavioral: Negative.        Physical Exam:  height is 6' (1.829 m) and weight is 192 lb 0.6 oz (87.1 kg). His oral temperature is 98.4 F (36.9 C). His blood pressure is 152/65 (abnormal) and his pulse is 64. His respiration is 20 and oxygen saturation is 99%.   Wt Readings from Last 3 Encounters:  08/25/23 192 lb 0.6 oz (87.1 kg)  07/28/23 192 lb 4 oz (87.2 kg)  06/30/23 194 lb (88 kg)   Physical Exam Vitals reviewed.  HENT:     Head: Normocephalic and atraumatic.  Eyes:     Pupils: Pupils are equal, round, and reactive to light.  Cardiovascular:     Rate and Rhythm: Normal rate and regular rhythm.     Heart sounds: Normal heart sounds.  Pulmonary:     Effort: Pulmonary effort is normal.     Breath sounds: Normal breath sounds.  Abdominal:     General: Bowel sounds are normal.     Palpations: Abdomen is soft.  Musculoskeletal:        General: No tenderness or deformity. Normal range of motion.     Cervical back: Normal range of motion.  Lymphadenopathy:     Cervical: No cervical adenopathy.  Skin:    General: Skin is warm and dry.     Findings: No erythema or rash.  Neurological:     Mental Status: He is alert and oriented to person, place, and time.  Psychiatric:        Behavior: Behavior normal.        Thought Content: Thought content normal.        Judgment: Judgment normal.      Lab Results  Component Value Date   WBC 5.3 08/25/2023   HGB 13.7 08/25/2023   HCT 41.7 08/25/2023   MCV 97.0 08/25/2023   PLT 156 08/25/2023   Lab Results  Component Value Date   FERRITIN 72 09/06/2021   IRON 87 09/06/2021   TIBC 310 09/06/2021   UIBC 223 09/06/2021   IRONPCTSAT 28 09/06/2021   Lab Results  Component Value Date   RETICCTPCT 0.8 08/12/2018   RBC 4.30 08/25/2023   Lab Results   Component Value Date   KPAFRELGTCHN 54.5 (H) 02/24/2023   LAMBDASER 27.0 (H) 02/24/2023   KAPLAMBRATIO 2.02 (H) 02/24/2023   Lab Results  Component Value Date  IGGSERUM 849 02/24/2023   IGA 438 (H) 02/24/2023   IGMSERUM 47 02/24/2023   Lab Results  Component Value Date   TOTALPROTELP 6.1 02/24/2023   ALBUMINELP 3.2 02/24/2023   A1GS 0.2 02/24/2023   A2GS 0.8 02/24/2023   BETS 1.0 02/24/2023   GAMS 0.9 02/24/2023   MSPIKE Not Observed 02/24/2023   SPEI Comment 02/24/2023     Chemistry      Component Value Date/Time   NA 141 08/25/2023 1153   NA 142 07/01/2019 1154   K 4.7 08/25/2023 1153   CL 106 08/25/2023 1153   CO2 29 08/25/2023 1153   BUN 24 (H) 08/25/2023 1153   BUN 25 07/01/2019 1154   CREATININE 1.92 (H) 08/25/2023 1153   CREATININE 1.61 (H) 02/26/2019 1511      Component Value Date/Time   CALCIUM 9.0 08/25/2023 1153   ALKPHOS 101 08/25/2023 1153   AST 15 08/25/2023 1153   ALT 13 08/25/2023 1153   BILITOT 0.7 08/25/2023 1153       Impression and Plan: Alex Dunn is a very pleasant 87 yo caucasian gentleman with IgA kappa MGUS.  There is no obvious measurable monoclonal spike in his blood last him that we saw him.    I do not see that a bone marrow biopsy is necessary at this point. He is doing well.   RTC 6 months MD, labs (CBC w/, CMP, light chains, IgG/IgA/IgM, LDH, SPEP)-[place]    Rushie Chestnut, PA-C 10/21/20241:40 PM

## 2023-08-25 NOTE — Patient Instructions (Signed)
Erythropoietin level

## 2023-08-26 ENCOUNTER — Ambulatory Visit: Payer: Medicare Other | Admitting: Audiologist

## 2023-08-26 DIAGNOSIS — H8110 Benign paroxysmal vertigo, unspecified ear: Secondary | ICD-10-CM | POA: Diagnosis not present

## 2023-08-26 DIAGNOSIS — R42 Dizziness and giddiness: Secondary | ICD-10-CM | POA: Diagnosis not present

## 2023-08-26 DIAGNOSIS — R2689 Other abnormalities of gait and mobility: Secondary | ICD-10-CM | POA: Diagnosis not present

## 2023-08-26 DIAGNOSIS — H903 Sensorineural hearing loss, bilateral: Secondary | ICD-10-CM | POA: Diagnosis not present

## 2023-08-26 DIAGNOSIS — H8111 Benign paroxysmal vertigo, right ear: Secondary | ICD-10-CM | POA: Diagnosis not present

## 2023-08-26 LAB — KAPPA/LAMBDA LIGHT CHAINS
Kappa free light chain: 61 mg/L — ABNORMAL HIGH (ref 3.3–19.4)
Kappa, lambda light chain ratio: 2.28 — ABNORMAL HIGH (ref 0.26–1.65)
Lambda free light chains: 26.8 mg/L — ABNORMAL HIGH (ref 5.7–26.3)

## 2023-08-26 LAB — IGG, IGA, IGM
IgA: 464 mg/dL — ABNORMAL HIGH (ref 61–437)
IgG (Immunoglobin G), Serum: 941 mg/dL (ref 603–1613)
IgM (Immunoglobulin M), Srm: 54 mg/dL (ref 15–143)

## 2023-08-26 NOTE — Procedures (Unsigned)
[facility] [address] [place], [place]  [address] [phone]  AUDIOLOGICAL  EVALUATION  NAME: Alex Dunn     DOB:   [birth year]      MRN: [id number]                                                                                     DATE: 08/26/2023     REFERENT: Sharlene Dory, DO STATUS: Outpatient DIAGNOSIS: Sensorineural Hearing Loss Bilateral   History: Alex Dunn was seen for an audiological evaluation due to difficulty hearing people clearly. This started a while ago but has been getting worse. He lost the hearing in his right ear suddenly and had terrible vertigo last May. He was hospitalized for the vertigo. The hearing in his right ear has slowly recovered since last May, but he is still struggling to hear people. He has a ringing sound in the right ear. He had imaging while admitted for his vertigo. He was not given a specific reason for the episode. MRI brain was negative-evaluated by neurology at inpatient and not felt to have central causes of vertigo. He has been in vestibular PT and making progress.  Evaluation:  Otoscopy showed a clear view of the tympanic membranes, bilaterally Tympanometry results were consistent with normal middle ear function, bilaterally   Audiometric testing was completed using Conventional Audiometry techniques with insert earphones and TDH headphones. Test results are consistent with normal hearing dropping after 1.5kHz to a profound hearing loss bilaterally, asymmetry with right ear 25dB worse only at [facility], all other hearing symmetric. Speech Recognition Thresholds were obtained at 30 dB HL in the right ear and at 25  dB HL in the left ear. Word Recognition Testing was completed at 40dB SL and Alex Dunn scored 72% in the right ear and 100% in the left.   Results:  The test results were reviewed with Alex Dunn. He has a sloping sensorineural hearing loss bilaterally with an asymmetry at [facility] only.  [name] has been evaluated and there was no determined cause for the asymmetry. Recommend he try hearing aids for both ears and continue with all recommendations for his vestibular symptoms.    Recommendations: 1.   Hearing aids recommended for both ears. See list of local hearing aid providers.    32 minutes spent testing and counseling on results.   If you have any questions please feel free to contact me at [phone].  Ammie Ferrier Audiologist, Au.D., CCC-A 08/26/2023  3:59 PM  Cc: Sharlene Dory, DO

## 2023-08-27 LAB — PROTEIN ELECTROPHORESIS, SERUM, WITH REFLEX
A/G Ratio: 1.1 (ref 0.7–1.7)
Albumin ELP: 3.3 g/dL (ref 2.9–4.4)
Alpha-1-Globulin: 0.2 g/dL (ref 0.0–0.4)
Alpha-2-Globulin: 0.7 g/dL (ref 0.4–1.0)
Beta Globulin: 1.2 g/dL (ref 0.7–1.3)
Gamma Globulin: 0.8 g/dL (ref 0.4–1.8)
Globulin, Total: 2.9 g/dL (ref 2.2–3.9)
Total Protein ELP: 6.2 g/dL (ref 6.0–8.5)

## 2023-09-18 ENCOUNTER — Other Ambulatory Visit: Payer: Self-pay | Admitting: Internal Medicine

## 2023-11-06 ENCOUNTER — Telehealth: Payer: Self-pay | Admitting: Internal Medicine

## 2023-11-06 NOTE — Telephone Encounter (Signed)
 Pt c/o medication issue:  1. Name of Medication:   losartan  (COZAAR ) 50 MG tablet  flecainide  (TAMBOCOR ) 50 MG tablet   2. How are you currently taking this medication (dosage and times per day)?   3. Are you having a reaction (difficulty breathing--STAT)? No  4. What is your medication issue? Pt's wife would like to know if the above medications need to be adjusted due to pt's BP being elevated mainly at night. She would like a c/b regarding this matter. Please advise

## 2023-11-06 NOTE — Telephone Encounter (Signed)
 Spoke with patient and he stated his BP has been elevated. Mostly in the evening since christmas. He states the highest has been in the 160's. He states he has minimal salt intake.  Advised to monitor for right now and take his BP BID and keep log. ED precautions  discussed will forward to provider

## 2023-11-10 NOTE — Telephone Encounter (Signed)
 Increase the losartan to 100 mg daily. Check bmp in 2 weeks.

## 2023-11-11 ENCOUNTER — Other Ambulatory Visit (HOSPITAL_COMMUNITY): Payer: Self-pay

## 2023-11-11 ENCOUNTER — Other Ambulatory Visit: Payer: Self-pay

## 2023-11-11 DIAGNOSIS — Z79899 Other long term (current) drug therapy: Secondary | ICD-10-CM

## 2023-11-11 MED ORDER — LOSARTAN POTASSIUM 100 MG PO TABS
100.0000 mg | ORAL_TABLET | Freq: Every day | ORAL | 3 refills | Status: DC
  Filled 2023-11-11: qty 90, 90d supply, fill #0

## 2023-11-11 NOTE — Telephone Encounter (Signed)
 Wife called to request RN Judeth Cornfield call patient directly  at [phone] regarding his elevated BP.

## 2023-11-11 NOTE — Telephone Encounter (Signed)
 Spoke with Pts wife. Stated Pt would call me back.

## 2023-11-11 NOTE — Telephone Encounter (Signed)
 Addressed in other message.

## 2023-11-12 ENCOUNTER — Other Ambulatory Visit (HOSPITAL_COMMUNITY): Payer: Self-pay

## 2023-11-13 ENCOUNTER — Other Ambulatory Visit (HOSPITAL_COMMUNITY): Payer: Self-pay

## 2023-11-19 ENCOUNTER — Ambulatory Visit: Payer: Self-pay | Admitting: Family Medicine

## 2023-11-19 ENCOUNTER — Ambulatory Visit: Payer: Medicare Other | Admitting: Physician Assistant

## 2023-11-19 NOTE — Telephone Encounter (Signed)
 Copied from CRM [id number]. Topic: Clinical - Red Word Triage >> Nov 19, 2023 11:35 AM Crispin Dolphin wrote: Red Word that prompted transfer to Nurse Triage: Patient having episode since Sunday morning. Really bad pain and body aches all over. Not sleeping well. Weakness.  Chief Complaint: severe muscle aches all over Symptoms: severe pain Frequency: constant Pertinent Negatives: Patient denies fever, sob Disposition: [] ED /[] Urgent Care (no appt availability in office) / [] Appointment(In office/virtual)/ []  [facility]/ [] Home Care/ [] Refused Recommended Disposition /[]  Mobile Bus/ [x]  Follow-up with PCP Additional Notes: Patient states gets these episodes.  Wife is concerned it is the Lipitor causing his pain.  Patient declined apt. For today, states he just can't make it due to weakness and pain.  Wife instructed to take him to ER if becomes worse.  Care advice give; denies questions.   Reason for Disposition  [1] SEVERE pain (e.g., excruciating, unable to do any normal activities) AND [2] not improved 2 hours after pain medicine  Answer Assessment - Initial Assessment Questions 1. ONSET: "When did the muscle aches or body pains start?"      Sunday early am.  2. LOCATION: "What part of your body is hurting?" (e.g., entire body, arms, legs)      "All over" 3. SEVERITY: "How bad is the pain?" (Scale 1-10; or mild, moderate, severe)   - MILD (1-3): doesn't interfere with normal activities    - MODERATE (4-7): interferes with normal activities or awakens from sleep    - SEVERE (8-10):  excruciating pain, unable to do any normal activities      severe 4. CAUSE: "What do you think is causing the pains?"     unknown 5. FEVER: "Have you been having fever?"     denies 6. OTHER SYMPTOMS: "Do you have any other symptoms?" (e.g., chest pain, weakness, rash, cold or flu symptoms, weight loss)     weakness  Protocols used: Muscle Aches and Body Pain-A-AH

## 2023-11-20 ENCOUNTER — Encounter: Payer: Self-pay | Admitting: Physician Assistant

## 2023-11-20 ENCOUNTER — Ambulatory Visit (INDEPENDENT_AMBULATORY_CARE_PROVIDER_SITE_OTHER): Payer: Medicare Other | Admitting: Physician Assistant

## 2023-11-20 VITALS — BP 155/77 | HR 78 | Temp 98.4°F | Resp 18 | Ht 72.0 in | Wt 190.5 lb

## 2023-11-20 DIAGNOSIS — R52 Pain, unspecified: Secondary | ICD-10-CM

## 2023-11-20 LAB — POCT URINALYSIS DIP (MANUAL ENTRY)
Bilirubin, UA: NEGATIVE
Blood, UA: NEGATIVE
Glucose, UA: NEGATIVE mg/dL
Leukocytes, UA: NEGATIVE
Nitrite, UA: NEGATIVE
Protein Ur, POC: NEGATIVE mg/dL
Spec Grav, UA: 1.015 (ref 1.010–1.025)
Urobilinogen, UA: 0.2 U/dL
pH, UA: 5 (ref 5.0–8.0)

## 2023-11-20 NOTE — Progress Notes (Signed)
Established patient visit   Patient: Alex Dunn   DOB: 07-Dec-1935   88 y.o. Male  MRN: [id number] Visit Date: 11/20/2023  Today's healthcare provider: Alfredia Ferguson, PA-C   Chief Complaint  Patient presents with   Generalized Body Aches    Started on Monday morning w/ body aches, denies headache,sore throat, cough, fever Pt states he is no longer having body aches Covid test at home negative yesterday   Subjective     Patient reports starting Monday he felt fairly significant body aches and fatigue.  This resolved Monday for a few hours but then returned, felt poorly throughout the rest of this week the worst being yesterday.  He felt very fatigued, achy and dehydrated yesterday despite how much he is drinking he feels like he is not urinating enough.  Today overall he feels better denies any body aches today.  Denies any cold symptoms, no nasal congestion, sore throat, headache, fevers.  He is concerned over fluctuating blood pressures, reports he cannot tolerate a blood pressure under 130 systolic as he feels dizzy.  He reports the middle the night he will wake up and feel he has elevated blood pressure, if he checks systolic will be 409W or 170s.  Reports taking Tylenol for his symptoms.  He denies dysuria, hematuria.  He denies any changes in his bowel movements.  He reports within the last 1-2 months he was treated for a dental abscess with antibiotics and has not felt normal since then. Patient took an over-the-counter flu and COVID test yesterday which were negative.  Medications: Outpatient Medications Prior to Visit  Medication Sig Note   acetaminophen (TYLENOL) 325 MG tablet Take 650 mg by mouth 4 (four) times daily as needed for mild pain or moderate pain (pain).    aspirin EC 81 MG tablet Take 81 mg by mouth daily. Swallow whole.    atorvastatin (LIPITOR) 10 MG tablet TAKE 1 TABLET(10 MG) BY MOUTH DAILY    Cholecalciferol (VITAMIN D) 50 MCG (2000 UT) CAPS Take  2,000 Units by mouth daily at 6 (six) AM.    cyanocobalamin 1000 MCG tablet Take 1,000 mcg by mouth daily.    flecainide (TAMBOCOR) 50 MG tablet TAKE 1 TABLET(50 MG) BY MOUTH TWICE DAILY    fluticasone (FLONASE) 50 MCG/ACT nasal spray Place 2 sprays into both nostrils daily.    losartan (COZAAR) 50 MG tablet Take 50 mg by mouth daily.    Multiple Vitamins-Minerals (PRESERVISION AREDS) CAPS Take 1 capsule by mouth daily.    traMADol (ULTRAM) 50 MG tablet Take 1 tablet (50 mg total) by mouth daily as needed for moderate pain or severe pain.    losartan (COZAAR) 100 MG tablet Take 1 tablet (100 mg total) by mouth daily. (Patient not taking: Reported on 11/20/2023) 11/20/2023: Was supposed to increase to 100mg - but hasn't    meclizine (ANTIVERT) 25 MG tablet Take 1 tablet (25 mg total) by mouth 3 (three) times daily as needed for dizziness. (Patient not taking: Reported on 11/20/2023)    pantoprazole (PROTONIX) 40 MG tablet Take 1 tablet (40 mg total) by mouth daily as needed. (Patient not taking: Reported on 11/20/2023) 11/20/2023: Rarely   No facility-administered medications prior to visit.    Review of Systems  Constitutional:  Positive for fatigue. Negative for fever.  Respiratory:  Negative for cough and shortness of breath.   Cardiovascular:  Negative for chest pain, palpitations and leg swelling.  Neurological:  Negative for dizziness and  headaches.       Objective    BP (!) 155/77   Pulse 78   Temp 98.4 F (36.9 C) (Oral)   Resp 18   Ht 6' (1.829 m)   Wt 190 lb 8 oz (86.4 kg)   SpO2 97%   BMI 25.84 kg/m    Physical Exam Constitutional:      General: He is awake.     Appearance: He is well-developed.  HENT:     Head: Normocephalic.     Mouth/Throat:     Comments: Upper gums do not appear swollen, erythematous, no abscess pockets Eyes:     Conjunctiva/sclera: Conjunctivae normal.  Cardiovascular:     Rate and Rhythm: Normal rate and regular rhythm.     Heart sounds:  Normal heart sounds.  Pulmonary:     Effort: Pulmonary effort is normal.     Breath sounds: Normal breath sounds.  Skin:    General: Skin is warm.  Neurological:     Mental Status: He is alert and oriented to person, place, and time.  Psychiatric:        Attention and Perception: Attention normal.        Mood and Affect: Mood normal.        Speech: Speech normal.        Behavior: Behavior is cooperative.     Results for orders placed or performed in visit on 11/20/23  POCT urinalysis dipstick  Result Value Ref Range   Color, UA yellow yellow   Clarity, UA clear clear   Glucose, UA negative negative mg/dL   Bilirubin, UA negative negative   Ketones, POC UA trace (5) (A) negative mg/dL   Spec Grav, UA 8.295 6.213 - 1.025   Blood, UA negative negative   pH, UA 5.0 5.0 - 8.0   Protein Ur, POC negative negative mg/dL   Urobilinogen, UA 0.2 0.2 or 1.0 E.U./dL   Nitrite, UA Negative Negative   Leukocytes, UA Negative Negative    Assessment & Plan    Body aches -     Urine Culture -     CBC with Differential/Platelet -     Comprehensive metabolic panel -     POCT urinalysis dipstick   Patient does not appear overwhelmingly dehydrated.  Recommending to stay consistent with fluids and Gatorade/Pedialyte at home.  Okay to take Tylenol for body aches.  Given they did a home flu and COVID test yesterday that was negative I do not feel the need to repeat today in office.  Recommending getting a urine sample to rule out cystitis, will check CBC CMP.  No apparent residual dental abscess.  UA today benign, no nitrites or leukocytes.  If culture is negative and labs are stable advised this is likely a viral illness that will run its course.  If symptoms persist past 7 days to get back in touch.  Return if symptoms worsen or fail to improve.       Alfredia Ferguson, PA-C  [facility] at [facility] [phone] (phone) [phone] (fax)  [facility]

## 2023-11-21 LAB — COMPREHENSIVE METABOLIC PANEL
ALT: 13 U/L (ref 0–53)
AST: 15 U/L (ref 0–37)
Albumin: 4.1 g/dL (ref 3.5–5.2)
Alkaline Phosphatase: 107 U/L (ref 39–117)
BUN: 25 mg/dL — ABNORMAL HIGH (ref 6–23)
CO2: 26 meq/L (ref 19–32)
Calcium: 9 mg/dL (ref 8.4–10.5)
Chloride: 102 meq/L (ref 96–112)
Creatinine, Ser: 1.8 mg/dL — ABNORMAL HIGH (ref 0.40–1.50)
GFR: 33.91 mL/min — ABNORMAL LOW (ref >60.00)
Glucose, Bld: 96 mg/dL (ref 70–99)
Potassium: 4.5 meq/L (ref 3.5–5.1)
Sodium: 138 meq/L (ref 135–145)
Total Bilirubin: 0.5 mg/dL (ref 0.2–1.2)
Total Protein: 6.8 g/dL (ref 6.0–8.3)

## 2023-11-21 LAB — CBC WITH DIFFERENTIAL/PLATELET
Basophils Absolute: 0.1 10*3/uL (ref 0.0–0.1)
Basophils Relative: 0.9 % (ref 0.0–3.0)
Eosinophils Absolute: 0.1 10*3/uL (ref 0.0–0.7)
Eosinophils Relative: 1.3 % (ref 0.0–5.0)
HCT: 43.7 % (ref 39.0–52.0)
Hemoglobin: 14.6 g/dL (ref 13.0–17.0)
Lymphocytes Relative: 23.8 % (ref 12.0–46.0)
Lymphs Abs: 1.7 10*3/uL (ref 0.7–4.0)
MCHC: 33.3 g/dL (ref 30.0–36.0)
MCV: 97.3 fL (ref 78.0–100.0)
Monocytes Absolute: 0.7 10*3/uL (ref 0.1–1.0)
Monocytes Relative: 9.5 % (ref 3.0–12.0)
Neutro Abs: 4.5 10*3/uL (ref 1.4–7.7)
Neutrophils Relative %: 64.5 % (ref 43.0–77.0)
Platelets: 192 10*3/uL (ref 150.0–400.0)
RBC: 4.5 Mil/uL (ref 4.22–5.81)
RDW: 13.5 % (ref 11.5–15.5)
WBC: 7 10*3/uL (ref 4.0–10.5)

## 2023-11-21 LAB — URINE CULTURE
MICRO NUMBER:: 15964349
Result:: NO GROWTH
SPECIMEN QUALITY:: ADEQUATE

## 2023-11-24 ENCOUNTER — Encounter: Payer: Self-pay | Admitting: Physician Assistant

## 2024-01-16 ENCOUNTER — Other Ambulatory Visit (HOSPITAL_COMMUNITY): Payer: Self-pay

## 2024-01-26 ENCOUNTER — Ambulatory Visit (INDEPENDENT_AMBULATORY_CARE_PROVIDER_SITE_OTHER): Payer: Medicare Other | Admitting: Family Medicine

## 2024-01-26 ENCOUNTER — Encounter: Payer: Self-pay | Admitting: Family Medicine

## 2024-01-26 VITALS — BP 134/86 | HR 74 | Temp 97.9°F | Ht 72.0 in | Wt 186.6 lb

## 2024-01-26 DIAGNOSIS — I10 Essential (primary) hypertension: Secondary | ICD-10-CM

## 2024-01-26 DIAGNOSIS — Z0000 Encounter for general adult medical examination without abnormal findings: Secondary | ICD-10-CM | POA: Diagnosis not present

## 2024-01-26 NOTE — Patient Instructions (Addendum)
 Give [place] 2-3 business days to get the results of your labs back.   Keep the diet clean and stay active.  Try 2 tablespoons of milk of mag in 4 oz of warm prune juice. Do that and wait a couple hours. If no improvement, try a Dulcolax suppository and then let me know if we are still having issues.   Try to drink 55-60 oz of water daily outside of exercise.  Please get your tetanus booster at the pharmacy.   Let us know if you need anything.

## 2024-01-26 NOTE — Progress Notes (Signed)
 Chief Complaint  Patient presents with   Annual Exam    Patient presents today for physical exam.   Quality Metric Gaps    AWV,TDAP    Well Male Alex Dunn is here for a complete physical.   His last physical was >1 year ago.  Current diet: in general, a "healthy" diet.   Current exercise: active in garage/yard Weight trend: stable Fatigue out of ordinary? No. Seat belt? Yes.   Advanced directive? Yes  Health maintenance Shingrix- Yes Tetanus- Due Pneumonia vaccine- Yes  Past Medical History:  Diagnosis Date   Abdominal aortic atherosclerosis (HCC) 04/15/2018   ANEMIA-IRON DEFICIENCY 06/23/2007   B12 deficiency 06/13/2020   BPH (benign prostatic hyperplasia) 06/23/2007   Qualifier: Diagnosis of  By: Marcelyn Ditty, RN, Katy Fitch   Formatting of this note might be different from the original. Formatting of this note might be different from the original. Qualifier: Diagnosis of  By: Marcelyn Ditty, RN, Katy Fitch   Degeneration of lumbar intervertebral disc 06/30/2018   Diverticulosis    Elevated diaphragm 04/15/2018   Essential hypertension 04/15/2018   Gastro-esophageal reflux disease without esophagitis 06/23/2007   Qualifier: Diagnosis of  By: Marcelyn Ditty, RN, Katy Fitch   Formatting of this note might be different from the original. Formatting of this note might be different from the original. Qualifier: Diagnosis of  By: Marcelyn Ditty, RN, Katy Fitch   Lumbar radiculopathy 05/06/2019   Lumbosacral spondylosis without myelopathy 03/10/2019   Monoclonal gammopathy of unknown significance (MGUS) 08/24/2018   Multiple renal cysts 04/06/2018   Nonsustained ventricular tachycardia (HCC) 11/03/2019   Primary osteoarthritis of both knees 06/30/2018   Bilateral moderate with moderate chondromalacia patella Formatting of this note might be different from the original. Formatting of this note might be different from the original. Bilateral moderate with moderate chondromalacia patella   Stage 3b chronic kidney  disease (HCC) 06/21/2020     Past Surgical History:  Procedure Laterality Date   HAND SURGERY     LUNG SURGERY     for collapsed lung   SPINAL CORD STIMULATOR IMPLANT  01/24/2023    Medications  Current Outpatient Medications on File Prior to Visit  Medication Sig Dispense Refill   acetaminophen (TYLENOL) 325 MG tablet Take 650 mg by mouth 4 (four) times daily as needed for mild pain or moderate pain (pain).     aspirin EC 81 MG tablet Take 81 mg by mouth daily. Swallow whole.     atorvastatin (LIPITOR) 10 MG tablet TAKE 1 TABLET(10 MG) BY MOUTH DAILY 90 tablet 3   Cholecalciferol (VITAMIN D) 50 MCG (2000 UT) CAPS Take 2,000 Units by mouth daily at 6 (six) AM.     cyanocobalamin 1000 MCG tablet Take 1,000 mcg by mouth daily.     flecainide (TAMBOCOR) 50 MG tablet TAKE 1 TABLET(50 MG) BY MOUTH TWICE DAILY 180 tablet 2   fluticasone (FLONASE) 50 MCG/ACT nasal spray Place 2 sprays into both nostrils daily. 16 g 6   losartan (COZAAR) 100 MG tablet Take 1 tablet (100 mg total) by mouth daily. 90 tablet 3   losartan (COZAAR) 50 MG tablet Take 50 mg by mouth daily.     meclizine (ANTIVERT) 25 MG tablet Take 1 tablet (25 mg total) by mouth 3 (three) times daily as needed for dizziness. 30 tablet 0   Multiple Vitamins-Minerals (PRESERVISION AREDS) CAPS Take 1 capsule by mouth daily.     pantoprazole (PROTONIX) 40 MG tablet Take 1 tablet (40 mg total) by mouth  daily as needed. 90 tablet 3   traMADol (ULTRAM) 50 MG tablet Take 1 tablet (50 mg total) by mouth daily as needed for moderate pain or severe pain. 90 tablet 1    Allergies Allergies  Allergen Reactions   Amitriptyline Other (See Comments)    SLEPT FOR 18-19 HOURS STRAIGHT   Codeine Phosphate Other (See Comments)    Seen odd things crawling up wall   Pregabalin Other (See Comments)    Drowsy, nightmares, stumbling   Tamsulosin Other (See Comments)    Faint feeling    Tape Other (See Comments)    Skin irritation   Lexapro  [Escitalopram Oxalate] Other (See Comments)    shaking    Family History Family History  Problem Relation Age of Onset   Heart attack Mother    Heart attack Father    Heart attack Brother        "leaky valve"   Neuromuscular disorder Neg Hx     Review of Systems: Constitutional:  no fevers Eye:  no recent significant change in vision Ears:  No changes in hearing Nose/Mouth/Throat:  no complaints of nasal congestion, no sore throat Cardiovascular: no chest pain Respiratory:  No shortness of breath Gastrointestinal:  +constipation GU:  No frequency Integumentary:  no abnormal skin lesions reported Neurologic:  no headaches Endocrine:  denies unexplained weight changes  Exam BP 134/86   Pulse 74   Temp 97.9 F (36.6 C)   Ht 6' (1.829 m)   Wt 186 lb 9.6 oz (84.6 kg)   SpO2 98%   BMI 25.31 kg/m  General:  well developed, well nourished, in no apparent distress Skin:  no significant moles, warts, or growths Head:  no masses, lesions, or tenderness Eyes:  pupils equal and round, sclera anicteric without injection Ears:  canals without lesions, TMs shiny without retraction, no obvious effusion, no erythema Nose:  nares patent, mucosa normal Throat/Pharynx:  lips and gingiva without lesion; tongue and uvula midline; non-inflamed pharynx; no exudates or postnasal drainage Lungs:  clear to auscultation, breath sounds equal bilaterally, no respiratory distress Cardio:  regular rate and rhythm, no LE edema or bruits Rectal: Deferred GI: BS+, S, NT, ND, no masses or organomegaly Musculoskeletal:  symmetrical muscle groups noted without atrophy or deformity Neuro:  gait normal; deep tendon reflexes normal and symmetric Psych: well oriented with normal range of affect and appropriate judgment/insight  Assessment and Plan  Well adult exam  Essential hypertension - Plan: Lipid panel   Well 88 y.o. male. Counseled on diet and exercise. Stay hydrated. Consider milk of mag prn.   Tetanus booster rec'd.  Other orders as above. Follow up in 6 mo.  The patient voiced understanding and agreement to the plan.  Jilda Roche [place], DO 01/26/24 1:24 PM

## 2024-01-27 ENCOUNTER — Encounter: Payer: Self-pay | Admitting: Family Medicine

## 2024-01-27 LAB — LIPID PANEL
Cholesterol: 126 mg/dL (ref 0–200)
HDL: 33.8 mg/dL — ABNORMAL LOW (ref >39.00)
LDL Cholesterol: 74 mg/dL (ref 0–99)
NonHDL: 91.85
Total CHOL/HDL Ratio: 4
Triglycerides: 89 mg/dL (ref 0.0–149.0)
VLDL: 17.8 mg/dL (ref 0.0–40.0)

## 2024-02-12 DIAGNOSIS — N1832 Chronic kidney disease, stage 3b: Secondary | ICD-10-CM | POA: Diagnosis not present

## 2024-02-18 DIAGNOSIS — I129 Hypertensive chronic kidney disease with stage 1 through stage 4 chronic kidney disease, or unspecified chronic kidney disease: Secondary | ICD-10-CM | POA: Diagnosis not present

## 2024-02-18 DIAGNOSIS — Q6102 Congenital multiple renal cysts: Secondary | ICD-10-CM | POA: Diagnosis not present

## 2024-02-18 DIAGNOSIS — N1832 Chronic kidney disease, stage 3b: Secondary | ICD-10-CM | POA: Diagnosis not present

## 2024-04-05 ENCOUNTER — Other Ambulatory Visit: Payer: Self-pay | Admitting: Family Medicine

## 2024-04-05 DIAGNOSIS — G8929 Other chronic pain: Secondary | ICD-10-CM

## 2024-04-05 NOTE — Telephone Encounter (Signed)
 Requesting: tramadol  50mg   Contract: None UDS: None Last Visit: 01/26/24 Next Visit: 07/26/24 Last Refill: 07/24/23 #90 and 1RF   Please Advise

## 2024-04-16 ENCOUNTER — Telehealth: Payer: Self-pay | Admitting: Internal Medicine

## 2024-04-16 NOTE — Telephone Encounter (Signed)
 Spoke to patient he stated he has been feeling bad,weakness since last night.No chest pain.No sob.Stated since he is up he feels better.B/P has been elevated.This morning 153/76 pulse 67.Appointment scheduled with Creighton Doffing NP 6/18 at 11:20 am.Advised if symptoms worsen he will need to go ED.I will make Dr.Taylor aware.

## 2024-04-16 NOTE — Telephone Encounter (Signed)
.  BPPt c/o BP issue: STAT if pt c/o blurred vision, one-sided weakness or slurred speech.   1. What is your BP concern?  Systolic is in the 160-170's at night.  2. Have you taken any BP medication today? No, patient takes Losartan  at night  3. What are your last 5 BP readings? 6/12: 149/77 64          143/65 64          125/68 71          139/67 60  6/13: 165/80 65 3:00 AM          152/81 5:10 AM           172/81 66 6:24 AM  4. Are you having any other symptoms (ex. Dizziness, headache, blurred vision, passed out)?  Just feeling bad

## 2024-04-20 NOTE — Progress Notes (Deleted)
  Electrophysiology Office Note:   Date:  04/20/2024  ID:  Alex Dunn, DOB Apr 04, 1936, MRN [id number]  Primary Cardiologist: None Primary Heart Failure: None Electrophysiologist: None  {Click to update primary MD,subspecialty MD or APP then REFRESH:1}    History of Present Illness:   Alex Dunn is a 88 y.o. male with h/o PVC's, NSVT, HTN, HLD, orthostatic hypotension, TIA, IDA, GERD, CKD III, MGUS, CIDP seen today for acute visit due to weakness, fatigue, elevated blood pressures.    Patient called the Cardiology office on 04/16/24 with reports of feeling poorly, weak and elevated blood pressure > BP 150-160's systolic.    He denies chest pain, palpitations, dyspnea, PND, orthopnea, nausea, vomiting, dizziness, syncope, edema, weight gain, or early satiety.   Review of systems complete and found to be negative unless listed in HPI.   EP Information / Studies Reviewed:    EKG is ordered today. Personal review as below.      Studies:  Cardiac Monitor 11/2020 > predominantly NSR, 4 VT runs, 4 SVT runs, 15.7% PVC burden ECHO 02/2021 > LVEF 60-65%, G1DD, mild MV regurgitation, mild late systolic prolapse of multiple scallops of the posterior leaflet of the MV  Arrhythmia / AAD PVC's  Flecainide     Risk Assessment/Calculations:     No BP recorded.  {Refresh Note OR Click here to enter BP  :1}***        Physical Exam:   VS:  There were no vitals taken for this visit.   Wt Readings from Last 3 Encounters:  01/26/24 186 lb 9.6 oz (84.6 kg)  11/20/23 190 lb 8 oz (86.4 kg)  08/25/23 192 lb 0.6 oz (87.1 kg)     GEN: Well nourished, well developed in no acute distress NECK: No JVD; No carotid bruits CARDIAC: {EPRHYTHM:28826}, no murmurs, rubs, gallops RESPIRATORY:  Clear to auscultation without rales, wheezing or rhonchi  ABDOMEN: Soft, non-tender, non-distended EXTREMITIES:  No edema; No deformity   ASSESSMENT AND PLAN:    PVC's  Prior 15.7% burden on monitor.  -EKG  with NSR, stable intervals ***  -asymptomatic ***   Hypertension  -***   Follow up with Dr. Carolynne Citron {EPFOLLOW WU:98119}  Signed, Creighton Doffing, NP-C, AGACNP-BC [facility] - Electrophysiology  04/20/2024, 1:44 PM

## 2024-04-21 ENCOUNTER — Ambulatory Visit: Admitting: Pulmonary Disease

## 2024-04-28 DIAGNOSIS — M961 Postlaminectomy syndrome, not elsewhere classified: Secondary | ICD-10-CM | POA: Diagnosis not present

## 2024-04-28 DIAGNOSIS — Z9689 Presence of other specified functional implants: Secondary | ICD-10-CM | POA: Diagnosis not present

## 2024-04-28 DIAGNOSIS — G629 Polyneuropathy, unspecified: Secondary | ICD-10-CM | POA: Diagnosis not present

## 2024-05-03 ENCOUNTER — Emergency Department (HOSPITAL_BASED_OUTPATIENT_CLINIC_OR_DEPARTMENT_OTHER)
Admission: EM | Admit: 2024-05-03 | Discharge: 2024-05-03 | Disposition: A | Discharge status: Home / Self Care | Attending: Emergency Medicine | Admitting: Emergency Medicine

## 2024-05-03 ENCOUNTER — Other Ambulatory Visit: Payer: Self-pay

## 2024-05-03 ENCOUNTER — Emergency Department (HOSPITAL_BASED_OUTPATIENT_CLINIC_OR_DEPARTMENT_OTHER)

## 2024-05-03 ENCOUNTER — Encounter (HOSPITAL_BASED_OUTPATIENT_CLINIC_OR_DEPARTMENT_OTHER): Payer: Self-pay | Admitting: Emergency Medicine

## 2024-05-03 ENCOUNTER — Emergency Department (HOSPITAL_COMMUNITY)

## 2024-05-03 DIAGNOSIS — G629 Polyneuropathy, unspecified: Secondary | ICD-10-CM | POA: Diagnosis not present

## 2024-05-03 DIAGNOSIS — Z7982 Long term (current) use of aspirin: Secondary | ICD-10-CM | POA: Insufficient documentation

## 2024-05-03 DIAGNOSIS — H538 Other visual disturbances: Secondary | ICD-10-CM | POA: Diagnosis not present

## 2024-05-03 DIAGNOSIS — N189 Chronic kidney disease, unspecified: Secondary | ICD-10-CM | POA: Diagnosis not present

## 2024-05-03 DIAGNOSIS — Z8673 Personal history of transient ischemic attack (TIA), and cerebral infarction without residual deficits: Secondary | ICD-10-CM | POA: Insufficient documentation

## 2024-05-03 DIAGNOSIS — Y900 Blood alcohol level of less than 20 mg/100 ml: Secondary | ICD-10-CM | POA: Diagnosis not present

## 2024-05-03 DIAGNOSIS — I672 Cerebral atherosclerosis: Secondary | ICD-10-CM | POA: Insufficient documentation

## 2024-05-03 DIAGNOSIS — R29818 Other symptoms and signs involving the nervous system: Secondary | ICD-10-CM | POA: Diagnosis not present

## 2024-05-03 DIAGNOSIS — R42 Dizziness and giddiness: Secondary | ICD-10-CM | POA: Diagnosis not present

## 2024-05-03 LAB — DIFFERENTIAL
Abs Immature Granulocytes: 0.02 10*3/uL (ref 0.00–0.07)
Basophils Absolute: 0 10*3/uL (ref 0.0–0.1)
Basophils Relative: 1 %
Eosinophils Absolute: 0.1 10*3/uL (ref 0.0–0.5)
Eosinophils Relative: 2 %
Immature Granulocytes: 0 %
Lymphocytes Relative: 25 %
Lymphs Abs: 1.9 10*3/uL (ref 0.7–4.0)
Monocytes Absolute: 0.6 10*3/uL (ref 0.1–1.0)
Monocytes Relative: 8 %
Neutro Abs: 5 10*3/uL (ref 1.7–7.7)
Neutrophils Relative %: 64 %

## 2024-05-03 LAB — COMPREHENSIVE METABOLIC PANEL WITH GFR
ALT: 17 U/L (ref 0–44)
AST: 20 U/L (ref 15–41)
Albumin: 4.1 g/dL (ref 3.5–5.0)
Alkaline Phosphatase: 137 U/L — ABNORMAL HIGH (ref 38–126)
Anion gap: 11 (ref 5–15)
BUN: 22 mg/dL (ref 8–23)
CO2: 23 mmol/L (ref 22–32)
Calcium: 9.3 mg/dL (ref 8.9–10.3)
Chloride: 105 mmol/L (ref 98–111)
Creatinine, Ser: 1.74 mg/dL — ABNORMAL HIGH (ref 0.61–1.24)
GFR, Estimated: 38 mL/min — ABNORMAL LOW (ref >60)
Glucose, Bld: 109 mg/dL — ABNORMAL HIGH (ref 70–99)
Potassium: 4.7 mmol/L (ref 3.5–5.1)
Sodium: 139 mmol/L (ref 135–145)
Total Bilirubin: 0.4 mg/dL (ref 0.0–1.2)
Total Protein: 6.9 g/dL (ref 6.5–8.1)

## 2024-05-03 LAB — CBC
HCT: 45.2 % (ref 39.0–52.0)
Hemoglobin: 15 g/dL (ref 13.0–17.0)
MCH: 32.3 pg (ref 26.0–34.0)
MCHC: 33.2 g/dL (ref 30.0–36.0)
MCV: 97.2 fL (ref 80.0–100.0)
Platelets: 187 10*3/uL (ref 150–400)
RBC: 4.65 MIL/uL (ref 4.22–5.81)
RDW: 13.2 % (ref 11.5–15.5)
WBC: 7.7 10*3/uL (ref 4.0–10.5)
nRBC: 0 % (ref 0.0–0.2)

## 2024-05-03 LAB — PROTIME-INR
INR: 1 (ref 0.8–1.2)
Prothrombin Time: 14 s (ref 11.4–15.2)

## 2024-05-03 LAB — APTT: aPTT: 29 s (ref 24–36)

## 2024-05-03 LAB — CBG MONITORING, ED: Glucose-Capillary: 123 mg/dL — ABNORMAL HIGH (ref 70–99)

## 2024-05-03 LAB — ETHANOL: Alcohol, Ethyl (B): <15 mg/dL (ref <15)

## 2024-05-03 NOTE — ED Notes (Signed)
 CCMD called to admit the patient to cardiac monitoring.

## 2024-05-03 NOTE — ED Provider Notes (Signed)
 [place] [facility] [place] [facility] AT [facility] Provider Note   CSN: [id number] Arrival date & time: 05/03/24  1241     Patient presents with: Blurred Vision   Alex Dunn is a 88 y.o. male.   HPI 88 year old male presents with dizziness and blurry vision.  Dizziness started last night around 6 PM.  He went to bed and when he woke up it was still there.  He feels off balance.  He is also having blurry vision that started on the way here.  It is bilateral and there is no loss of vision.  No double vision.  No headache, chest pain, shortness of breath.  Feels off balance with walking but denies any weakness or numbness in his extremities.  No facial droop.  Has a past medical history significant for CKD, stroke, neuropathy, and other comorbidities.  Prior to Admission medications   Medication Sig Start Date End Date Taking? Authorizing Provider  acetaminophen  (TYLENOL ) 325 MG tablet Take 650 mg by mouth 4 (four) times daily as needed for mild pain or moderate pain (pain).    [provider]  aspirin  EC 81 MG tablet Take 81 mg by mouth daily. Swallow whole.    [provider]  atorvastatin  (LIPITOR) 10 MG tablet TAKE 1 TABLET(10 MG) BY MOUTH DAILY 07/08/23   Wendling, Mabel Mt, DO  Cholecalciferol (VITAMIN D ) 50 MCG (2000 UT) CAPS Take 2,000 Units by mouth daily at 6 (six) AM.    [provider]  cyanocobalamin  1000 MCG tablet Take 1,000 mcg by mouth daily.    [provider]  flecainide  (TAMBOCOR ) 50 MG tablet TAKE 1 TABLET(50 MG) BY MOUTH TWICE DAILY 09/18/23   Waddell Danelle LELON, MD  fluticasone  (FLONASE ) 50 MCG/ACT nasal spray Place 2 sprays into both nostrils daily. 03/19/23   Frann Mabel Mt, DO  losartan  (COZAAR ) 100 MG tablet Take 1 tablet (100 mg total) by mouth daily. 11/11/23   Waddell Danelle LELON, MD  losartan  (COZAAR ) 50 MG tablet Take 50 mg by mouth daily.    [provider]  Multiple Vitamins-Minerals (PRESERVISION  AREDS) CAPS Take 1 capsule by mouth daily. 06/21/20   [provider]  pantoprazole  (PROTONIX ) 40 MG tablet Take 1 tablet (40 mg total) by mouth daily as needed. 08/27/22   Frann Mabel Mt, DO  traMADol  (ULTRAM ) 50 MG tablet Take 1 tablet (50 mg total) by mouth every 12 (twelve) hours as needed. 04/05/24   Frann Mabel Mt, DO    Allergies: Amitriptyline, Codeine phosphate, Pregabalin, Tamsulosin , Tape, and Lexapro  [escitalopram  oxalate]    Review of Systems  Eyes:  Positive for visual disturbance.  Respiratory:  Negative for shortness of breath.   Cardiovascular:  Negative for chest pain.  Neurological:  Positive for dizziness. Negative for weakness, numbness and headaches.    Updated Vital Signs BP (!) 161/77 (BP Location: Left Arm)   Pulse 86   Temp 98 F (36.7 C) (Oral)   Resp 17   Wt 86.2 kg   SpO2 98%   BMI 25.77 kg/m   Physical Exam Vitals and nursing note reviewed.  Constitutional:      Appearance: He is well-developed.  HENT:     Head: Normocephalic and atraumatic.   Eyes:     Extraocular Movements: Extraocular movements intact.     Pupils: Pupils are equal, round, and reactive to light.     Comments: Grossly normal visual fields   Cardiovascular:     Rate and Rhythm: Normal  rate and regular rhythm.     Heart sounds: Normal heart sounds.  Pulmonary:     Effort: Pulmonary effort is normal.     Breath sounds: Normal breath sounds.  Abdominal:     General: There is no distension.   Skin:    General: Skin is warm and dry.   Neurological:     Mental Status: He is alert.     Comments: CN 3-12 grossly intact. 5/5 strength in all 4 extremities. Grossly normal sensation. Normal finger to nose. Normal gait without assistance    (all labs ordered are listed, but only abnormal results are displayed) Labs Reviewed  COMPREHENSIVE METABOLIC PANEL WITH GFR - Abnormal; Notable for the following components:      Result Value   Glucose, Bld 109 (*)     Creatinine, Ser 1.74 (*)    Alkaline Phosphatase 137 (*)    GFR, Estimated 38 (*)    All other components within normal limits  CBG MONITORING, ED - Abnormal; Notable for the following components:   Glucose-Capillary 123 (*)    All other components within normal limits  ETHANOL  PROTIME-INR  APTT  CBC  DIFFERENTIAL  URINE DRUG SCREEN    EKG: EKG Interpretation Date/Time:  Monday May 03 2024 13:00:12 EDT Ventricular Rate:  83 PR Interval:  202 QRS Duration:  94 QT Interval:  356 QTC Calculation: 419 R Axis:   33  Text Interpretation: Sinus rhythm Atrial premature complexes Low voltage, precordial leads Abnormal R-wave progression, early transition no significant change since 2024 Confirmed by Freddi Hamilton [phone]) on 05/03/2024 1:04:07 PM  Radiology: CT HEAD WO CONTRAST Result Date: 05/03/2024 CLINICAL DATA:  Neuro deficit, concern for stroke, dizziness and blurred vision. EXAM: CT HEAD WITHOUT CONTRAST TECHNIQUE: Contiguous axial images were obtained from the base of the skull through the vertex without intravenous contrast. RADIATION DOSE REDUCTION: This exam was performed according to the departmental dose-optimization program which includes automated exposure control, adjustment of the mA and/or kV according to patient size and/or use of iterative reconstruction technique. COMPARISON:  MRI head 03/10/2023. FINDINGS: Brain: No acute intracranial hemorrhage. No CT evidence of acute infarct. No edema, mass effect, or midline shift. The basilar cisterns are patent. Ventricles: The ventricles are normal. Vascular: Atherosclerotic calcifications of the carotid siphons and intracranial vertebral arteries. No hyperdense vessel. Skull: No acute or aggressive finding. Orbits: Orbits are symmetric. Sinuses: The visualized paranasal sinuses are clear. Other: Mastoid air cells are clear. IMPRESSION: No CT evidence of acute intracranial abnormality. Electronically Signed   By: Donnice Mania  M.D.   On: 05/03/2024 13:58     Procedures   Medications Ordered in the ED - No data to display                                  Medical Decision Making Amount and/or Complexity of Data Reviewed External Data Reviewed: notes. Labs: ordered.    Details: Stable CKD Radiology: ordered and independent interpretation performed.    Details: No head bleed ECG/medicine tests: ordered and independent interpretation performed.    Details: Sinus rhythm   Patient presents with dizziness.  Seems to have acutely occurred last night and then some blurry vision this morning.  However no visual field deficit or unilateral vision loss.  No eye pain.  Further talking to the patient it seems like the dizziness has been on and off for a while and he  associates it with fluctuating blood pressures.  However the blurry vision is a new component and while I do not think he is a TNK candidate are within a treatment window, he does certainly have risk factors for stroke including hypertension, atherosclerosis, and previous stroke.  Thus we will send him ED to ED for MRI.  Discussed with Dr. Mannie at [facility], who accepts in transfer.  Wife will take POV.  Patient states he does have a spinal cord stimulator but that that was just turned off last time he needed an MRI.     Final diagnoses:  None    ED Discharge Orders     None          Freddi Hamilton, MD 05/03/24 [phone]

## 2024-05-03 NOTE — ED Notes (Signed)
Pt discharged. Pt given discharge papers and papers explained. Pt in NAD at this time

## 2024-05-03 NOTE — ED Provider Notes (Signed)
 12:55 PM I was asked to screen patient for possible acute stroke.  In brief, patient and wife provide the history.  Did not feel great yesterday morning and rested more.  However yesterday evening around 6 PM he started feeling dizzy.  When he woke up this morning he was still dizzy.  He decided to come here and noticed some blurry vision on the way.  The vision is a generalized blurriness in both eyes.  No loss of vision.  No headache, chest pain, focal weakness or numbness.  On a brief exam, he has no facial droop, clear speech, normal extraocular movements and grossly normal visual fields.  He has 5/5 strength in all 4 extremities, normal finger-to-nose, and unremarkable gait.  Will get a CBG and order a workup including labs and CT head but at this point I do not think this is a code stroke.   Freddi Hamilton, MD 05/03/24 [phone]

## 2024-05-03 NOTE — ED Provider Notes (Signed)
  Physical Exam  BP (!) 161/77 (BP Location: Left Arm)   Pulse 86   Temp 98 F (36.7 C) (Oral)   Resp 17   Wt 86.2 kg   SpO2 98%   BMI 25.77 kg/m   Physical Exam  Procedures  Procedures  ED Course / MDM    Medical Decision Making Amount and/or Complexity of Data Reviewed Labs: ordered. Radiology: ordered.   Transfer to [facility] for MRI for blurred vision and dizziness. discussed with patient and pending MRI.  MRI negative.  Blood pressure reassuring.  Discharged home to follow-up with PCP.       Patsey Lot, MD 05/03/24 RETHA

## 2024-05-03 NOTE — ED Triage Notes (Signed)
 Bilateral blurry vision 30 minutes ago , headache 3 days ago , Hx HTN and TIA . Right arm pain . Hx neuropathy .  Alert and oriented x 4 . Ambulatory to triage room with stay gait .

## 2024-05-03 NOTE — ED Notes (Signed)
 Spoke to the triage RN at [facility] ED, made aware about patients transfer and the need for MRI testing

## 2024-05-04 ENCOUNTER — Ambulatory Visit (INDEPENDENT_AMBULATORY_CARE_PROVIDER_SITE_OTHER): Admitting: Family Medicine

## 2024-05-04 ENCOUNTER — Encounter: Payer: Self-pay | Admitting: Family Medicine

## 2024-05-04 VITALS — BP 124/60 | HR 72 | Temp 98.0°F | Resp 16 | Ht 73.0 in | Wt 183.8 lb

## 2024-05-04 DIAGNOSIS — I10 Essential (primary) hypertension: Secondary | ICD-10-CM | POA: Diagnosis not present

## 2024-05-04 MED ORDER — OLMESARTAN MEDOXOMIL 20 MG PO TABS: 20.0000 mg | ORAL_TABLET | Freq: Every day | ORAL | 2 refills | Status: DC

## 2024-05-04 NOTE — Progress Notes (Signed)
 Chief Complaint  Patient presents with   Hypertension    Subjective Alex Dunn is a 88 y.o. male who presents for hypertension follow up. He does monitor home blood pressures. Blood pressures ranging from 110-140's/50-80's on average. He is intermittently taking 100 mg of losartan  daily and sometimes 50 mg daily.  His blood pressures have been fluctuating as noted above.  Blood pressure started to fluctuate when he was increased from 50 mg daily to 100 mg daily. Of note, he has been on flecainide  50 mg twice daily for nearly 4 years and has been stable on it. Patient has these side effects of medication: none He is adhering to a healthy diet overall. Current exercise: Active outside No CP or SOB.    Past Medical History:  Diagnosis Date   Abdominal aortic atherosclerosis (HCC) 04/15/2018   ANEMIA-IRON DEFICIENCY 06/23/2007   B12 deficiency 06/13/2020   BPH (benign prostatic hyperplasia) 06/23/2007   Qualifier: Diagnosis of  By: Nunzio, RN, Dagoberto Caldron   Formatting of this note might be different from the original. Formatting of this note might be different from the original. Qualifier: Diagnosis of  By: Nunzio, RN, Dagoberto Caldron   Degeneration of lumbar intervertebral disc 06/30/2018   Diverticulosis    Elevated diaphragm 04/15/2018   Essential hypertension 04/15/2018   Gastro-esophageal reflux disease without esophagitis 06/23/2007   Qualifier: Diagnosis of  By: Nunzio, RN, Dagoberto Caldron   Formatting of this note might be different from the original. Formatting of this note might be different from the original. Qualifier: Diagnosis of  By: Nunzio, RN, Dagoberto Caldron   Lumbar radiculopathy 05/06/2019   Lumbosacral spondylosis without myelopathy 03/10/2019   Monoclonal gammopathy of unknown significance (MGUS) 08/24/2018   Multiple renal cysts 04/06/2018   Nonsustained ventricular tachycardia (HCC) 11/03/2019   Primary osteoarthritis of both knees 06/30/2018   Bilateral moderate with moderate  chondromalacia patella Formatting of this note might be different from the original. Formatting of this note might be different from the original. Bilateral moderate with moderate chondromalacia patella   Stage 3b chronic kidney disease (HCC) 06/21/2020    Exam BP 124/60 (BP Location: Left Arm, Cuff Size: Normal)   Pulse 72   Temp 98 F (36.7 C) (Oral)   Resp 16   Ht 6' 1 (1.854 m)   Wt 183 lb 12.8 oz (83.4 kg)   SpO2 96%   BMI 24.25 kg/m  General:  well developed, well nourished, in no apparent distress Heart: RRR, no bruits, no LE edema Lungs: clear to auscultation, no accessory muscle use Psych: well oriented with normal range of affect and appropriate judgment/insight  Essential hypertension - Plan: olmesartan (BENICAR) 20 MG tablet  Chronic, not controlled.  Stop losartan , doubt he is getting 24-hour coverage with this.  Changed to olmesartan 20 mg daily.  Monitor blood pressure at home.  No change with flecainide  for now.  Counseled on diet and exercise. F/u in 1 month to recheck. The patient voiced understanding and agreement to the plan.  Mabel Mt [place], DO 05/04/24  12:40 PM

## 2024-05-04 NOTE — Patient Instructions (Signed)
 Keep the diet clean and stay active.  Don't change the flecainide  for now.   Keep monitoring your blood pressure at home.   Let us  know if you need anything.

## 2024-05-10 ENCOUNTER — Encounter: Payer: Self-pay | Admitting: Family Medicine

## 2024-05-10 ENCOUNTER — Ambulatory Visit: Payer: Self-pay

## 2024-05-10 ENCOUNTER — Ambulatory Visit: Payer: Self-pay | Admitting: Family Medicine

## 2024-05-10 ENCOUNTER — Ambulatory Visit (HOSPITAL_BASED_OUTPATIENT_CLINIC_OR_DEPARTMENT_OTHER)
Admission: RE | Admit: 2024-05-10 | Discharge: 2024-05-10 | Disposition: A | Discharge status: Home / Self Care | Source: Ambulatory Visit | Attending: Family Medicine | Admitting: Family Medicine

## 2024-05-10 ENCOUNTER — Ambulatory Visit (INDEPENDENT_AMBULATORY_CARE_PROVIDER_SITE_OTHER): Admitting: Family Medicine

## 2024-05-10 VITALS — BP 128/78 | HR 78 | Temp 98.0°F | Resp 16 | Ht 73.0 in | Wt 183.0 lb

## 2024-05-10 DIAGNOSIS — L989 Disorder of the skin and subcutaneous tissue, unspecified: Secondary | ICD-10-CM | POA: Diagnosis not present

## 2024-05-10 DIAGNOSIS — R6889 Other general symptoms and signs: Secondary | ICD-10-CM | POA: Diagnosis not present

## 2024-05-10 DIAGNOSIS — R0602 Shortness of breath: Secondary | ICD-10-CM | POA: Diagnosis not present

## 2024-05-10 DIAGNOSIS — Z9682 Presence of neurostimulator: Secondary | ICD-10-CM | POA: Diagnosis not present

## 2024-05-10 DIAGNOSIS — R42 Dizziness and giddiness: Secondary | ICD-10-CM

## 2024-05-10 DIAGNOSIS — R9389 Abnormal findings on diagnostic imaging of other specified body structures: Secondary | ICD-10-CM | POA: Diagnosis not present

## 2024-05-10 LAB — COMPREHENSIVE METABOLIC PANEL WITH GFR
ALT: 18 U/L (ref 0–53)
AST: 15 U/L (ref 0–37)
Albumin: 4.1 g/dL (ref 3.5–5.2)
Alkaline Phosphatase: 106 U/L (ref 39–117)
BUN: 31 mg/dL — ABNORMAL HIGH (ref 6–23)
CO2: 28 meq/L (ref 19–32)
Calcium: 9.5 mg/dL (ref 8.4–10.5)
Chloride: 106 meq/L (ref 96–112)
Creatinine, Ser: 1.88 mg/dL — ABNORMAL HIGH (ref 0.40–1.50)
GFR: 32.08 mL/min — ABNORMAL LOW (ref >60.00)
Glucose, Bld: 102 mg/dL — ABNORMAL HIGH (ref 70–99)
Potassium: 4.9 meq/L (ref 3.5–5.1)
Sodium: 140 meq/L (ref 135–145)
Total Bilirubin: 0.6 mg/dL (ref 0.2–1.2)
Total Protein: 6.8 g/dL (ref 6.0–8.3)

## 2024-05-10 LAB — URINALYSIS, MICROSCOPIC ONLY

## 2024-05-10 LAB — CBC
HCT: 43.7 % (ref 39.0–52.0)
Hemoglobin: 14.6 g/dL (ref 13.0–17.0)
MCHC: 33.4 g/dL (ref 30.0–36.0)
MCV: 96.5 fl (ref 78.0–100.0)
Platelets: 177 K/uL (ref 150.0–400.0)
RBC: 4.53 Mil/uL (ref 4.22–5.81)
RDW: 14 % (ref 11.5–15.5)
WBC: 7.5 K/uL (ref 4.0–10.5)

## 2024-05-10 LAB — TSH: TSH: 2.38 u[IU]/mL (ref 0.35–5.50)

## 2024-05-10 LAB — MAGNESIUM: Magnesium: 2.4 mg/dL (ref 1.5–2.5)

## 2024-05-10 MED ORDER — TRIAMCINOLONE ACETONIDE 0.1 % EX CREA: 1.0000 | TOPICAL_CREAM | Freq: Two times a day (BID) | CUTANEOUS | 0 refills | Status: DC

## 2024-05-10 NOTE — Progress Notes (Signed)
 Chief Complaint  Patient presents with   Tick Removal    Tick Bite    Alex Dunn is a 88 y.o. male here for a skin complaint.  He is here with his wife.  Duration: 7-8 months Location: R outer hip region Pruritic? Yes Painful? No Drainage? No New soaps/lotions/topicals/detergents? No Was bitten by a tick in mid May.  Other associated symptoms: got better and then worse around 1 week ago; no fevers, target lesion, or spreading redness Therapies tried thus far: TAO  Over the past few weeks, the patient has felt terrible.  He has low energy and gets lightheaded when he stands up.  It does not happen every time.  Tries to stay well-hydrated drinking around 40 ounces daily.  We switched him blood pressure medicine and has normalized.  He is not fully better yet.  He has not fallen.  He does get some shaking.  Some associated trouble breathing with exercise/exertion.  No chest pain.  Past Medical History:  Diagnosis Date   Abdominal aortic atherosclerosis (HCC) 04/15/2018   ANEMIA-IRON DEFICIENCY 06/23/2007   B12 deficiency 06/13/2020   BPH (benign prostatic hyperplasia) 06/23/2007   Qualifier: Diagnosis of  By: Nunzio, RN, Dagoberto Caldron   Formatting of this note might be different from the original. Formatting of this note might be different from the original. Qualifier: Diagnosis of  By: Nunzio, RN, Dagoberto Caldron   Degeneration of lumbar intervertebral disc 06/30/2018   Diverticulosis    Elevated diaphragm 04/15/2018   Essential hypertension 04/15/2018   Gastro-esophageal reflux disease without esophagitis 06/23/2007   Qualifier: Diagnosis of  By: Nunzio, RN, Dagoberto Caldron   Formatting of this note might be different from the original. Formatting of this note might be different from the original. Qualifier: Diagnosis of  By: Nunzio, RN, Dagoberto Caldron   Lumbar radiculopathy 05/06/2019   Lumbosacral spondylosis without myelopathy 03/10/2019   Monoclonal gammopathy of unknown significance (MGUS) 08/24/2018    Multiple renal cysts 04/06/2018   Nonsustained ventricular tachycardia (HCC) 11/03/2019   Primary osteoarthritis of both knees 06/30/2018   Bilateral moderate with moderate chondromalacia patella Formatting of this note might be different from the original. Formatting of this note might be different from the original. Bilateral moderate with moderate chondromalacia patella   Stage 3b chronic kidney disease (HCC) 06/21/2020    BP 128/78 (BP Location: Left Arm, Patient Position: Sitting)   Pulse 78   Temp 98 F (36.7 C) (Oral)   Resp 16   Ht 6' 1 (1.854 m)   Wt 183 lb (83 kg)   SpO2 97%   BMI 24.14 kg/m  Gen: awake, alert, appearing stated age Heart: RRR HEENT: Ear canals are patent without otorrhea, TMs negative, no sinus TTP, there is a patent without rhinorrhea, MMD, no pharyngeal exudate or erythema Lungs: CTAB. No accessory muscle use Skin: 2 pink and blanching papules on the right outer hip/buttock area with central excoriation. No drainage, erythema, TTP, fluctuance Psych: Age appropriate judgment and insight  Rigors - Plan: CBC, Comprehensive metabolic panel with GFR, TSH, Magnesium, Urine Microscopic Only, Urine Culture, DG Chest 2 View  Skin lesion of right lower extremity - Plan: triamcinolone  cream (KENALOG ) 0.1 %  Lightheadedness  Check chest x-ray, blood, and urine.  Continue to monitor blood pressure. No signs of Lyme disease.  Topical steroid as above. Increase hydration by about 10 to 15 ounces daily.  Consider compression stockings.  Reach out to cardiology, he has an appointment next week.  Continue to monitor blood pressure. F/u prn. The patient voiced understanding and agreement to the plan.  I spent 32 minutes with the patient discussing the above plans in addition to reviewing his chart on the same day of the visit.  Mabel Mt Fisher, DO 05/10/24 12:38 PM

## 2024-05-10 NOTE — Telephone Encounter (Signed)
 FYI Only or Action Required?: FYI only for provider.  Patient was last seen in primary care on 05/04/2024 by Frann Mabel Mt, DO. Called Nurse Triage reporting Tick Removal. Symptoms began several months ago. Interventions attempted: Nothing. Symptoms are: unchanged.  Triage Disposition: See Physician Within 24 Hours  Patient/caregiver understands and will follow disposition?: Yes  Pt states had 2 tick bites to R hip back in May, areas went away but now for 3 weeks been inflamed and pt feeling weak and not well. Appt scheduled today with PCP  Copied from CRM [id number]. Topic: Clinical - Red Word Triage >> May 10, 2024  8:34 AM Essie A wrote: Red Word that prompted transfer to Nurse Triage: Has 2 tick bites for over a month, both inflamed.  Seen at [facility] ER for dizziness (blurred vision and possible high BP) on 05/03/24. Saw Dr. Frann on 05/04/24. Reason for Disposition  [1] Red or very tender (to touch) area AND [2] started over 24 hours after the bite  Answer Assessment - Initial Assessment Questions 3. ONSET - TICK NOT STILL ATTACHED: If the tick has been removed, how long do you think the tick was attached before you removed it? (e.g., 5 hours, 2 days). When was this?     Back in may, unsure how long attached   4. LOCATION: Where is the tick bite located? (e.g., arm, leg)     R hip area  5. TYPE of TICK: Is it a wood tick or a deer tick? (e.g., deer tick, wood tick; unsure)     Unsure what kind  6. SIZE of TICK: How big is the tick? (e.g., size of poppy seed, apple seed, watermelon seed; unsure) Note: Deer ticks can be the size of a poppy seed (nymph) or an apple seed (adult).       Very tiny  8. OTHER SYMPTOMS: Do you have any other symptoms? (e.g., fever, rash, redness at bite area, red ring around bite)     Not feeling well and redness at sites and itching  Protocols used: Tick Bite-A-AH

## 2024-05-10 NOTE — Patient Instructions (Addendum)
 Try to drink 55-60 oz of water daily outside of exercise.  Consider wearing compression stockings.  Continue checking your blood pressure at home.   Try not to scratch as this can make things worse. Avoid scented products while dealing with this. You may resume when the itchiness resolves. Cold/cool compresses can help.   Please reach out to Dr. Krasowski to schedule a follow up appointment.  Let us  know if you need anything.

## 2024-05-11 ENCOUNTER — Telehealth: Payer: Self-pay | Admitting: Cardiology

## 2024-05-11 ENCOUNTER — Encounter: Payer: Self-pay | Admitting: Pulmonary Disease

## 2024-05-11 ENCOUNTER — Ambulatory Visit: Attending: Pulmonary Disease | Admitting: Pulmonary Disease

## 2024-05-11 VITALS — BP 136/74 | HR 72 | Ht 73.0 in | Wt 184.8 lb

## 2024-05-11 DIAGNOSIS — I493 Ventricular premature depolarization: Secondary | ICD-10-CM | POA: Diagnosis not present

## 2024-05-11 LAB — URINE CULTURE
MICRO NUMBER:: 16665043
SPECIMEN QUALITY:: ADEQUATE

## 2024-05-11 NOTE — Patient Instructions (Signed)
 Medication Instructions:  Stop flecainide  *If you need a refill on your cardiac medications before your next appointment, please call your pharmacy*  Lab Work: None ordered If you have labs (blood work) drawn today and your tests are completely normal, you will receive your results only by: MyChart Message (if you have MyChart) OR A paper copy in the mail If you have any lab test that is abnormal or we need to change your treatment, we will call you to review the results.  Follow-Up: At [facility], you and your health needs are our priority.  As part of our continuing mission to provide you with exceptional heart care, our providers are all part of one team.  This team includes your primary Cardiologist (physician) and Advanced Practice Providers or APPs (Physician Assistants and Nurse Practitioners) who all work together to provide you with the care you need, when you need it.  Your next appointment:   06/11/24 at 1:55 PM  Provider:   Daphne Barrack, NP    Other Instructions: Ensure you are getting enough fluids in per day.  Keep a bottle of water or liquid with you during daytime hours. Stop 1-2 hours before bedtime to avoid having to get up at night to go the bathroom.  Wear compression stockings daily  STOP your Flecainide  > keep a record of your symptoms to see if they improve If you continue to have body aches, talk to your PCP about stopping your statin and consider injectable form that does not come with those particular side effects if lipid control is needed.  We will monitor for return of your PVC's.

## 2024-05-11 NOTE — Progress Notes (Signed)
 Electrophysiology Office Note:   Date:  05/11/2024  ID:  WAYDEN SCHWERTNER, DOB 1936-08-20, MRN [id number]  Primary Cardiologist: None Primary Heart Failure: None Electrophysiologist: None      History of Present Illness:   Alex Dunn is a 88 y.o. male with h/o PVC's, NSVT, HTN, HLD, orthostatic hypotension, TIA, IDA, GERD, CKD III, MGUS, CIDP seen today for acute visit due to fatigue, reported low blood pressure with standing and muscle aches.    Patient called the Cardiology office on 04/16/24 with reports of feeling poorly, weak and elevated blood pressure > BP 150-160's systolic.     Patient reports he has been having dizziness, lightheadedness and vision changes. He was seen in the ER for the same on 05/03/24 with negative lab work, MRI brain. His PCP saw him on 05/10/24 and changed his blood pressure medication to Olmesartan  20 mg daily (from losartan ).  Labs from 05/10/24 show a rise in his renal function from 05/03/24 (1.88 from 1.74 Cr).  He apparently had two tick bites and his PCP was also checking him for Lyme Disease.   He states when he gets up to work around the house, his BP drops to 110's.  He feels lightheaded. He also notices visual disturbances, shortness of breath at times and muscle aches.   He denies chest pain, palpitations, dyspnea, PND, orthopnea, nausea, vomiting, dizziness, syncope, edema, weight gain, or early satiety.   Review of systems complete and found to be negative unless listed in HPI.   EP Information / Studies Reviewed:    EKG is ordered today. Personal review as below.  EKG Interpretation Date/Time:  Tuesday May 11 2024 15:49:56 EDT Ventricular Rate:  72 PR Interval:  206 QRS Duration:  74 QT Interval:  366 QTC Calculation: 400 R Axis:   14  Text Interpretation: Normal sinus rhythm Normal ECG Confirmed by Aniceto Jarvis ([id number]) on 05/11/2024 5:19:43 PM   Studies:  Cardiac Monitor 11/2020 > predominantly NSR, 4 VT runs, 4 SVT runs, 15.7% PVC  burden ECHO 02/2021 > LVEF 60-65%, G1DD, mild MV regurgitation, mild late systolic prolapse of multiple scallops of the posterior leaflet of the MV   Arrhythmia / AAD PVC's  Flecainide  > stopped 05/11/24 due to dizziness / lightheadedness / vision changes           Physical Exam:   VS:  BP 136/74   Pulse 72   Ht 6' 1 (1.854 m)   Wt 184 lb 12.8 oz (83.8 kg)   SpO2 95%   BMI 24.38 kg/m    Wt Readings from Last 3 Encounters:  05/11/24 184 lb 12.8 oz (83.8 kg)  05/10/24 183 lb (83 kg)  05/04/24 183 lb 12.8 oz (83.4 kg)     GEN: Elderly male, well nourished, well developed in no acute distress NECK: No JVD; No carotid bruits CARDIAC: Regular rate and rhythm, no murmurs, rubs, gallops RESPIRATORY:  Clear to auscultation without rales, wheezing or rhonchi  ABDOMEN: Soft, non-tender, non-distended EXTREMITIES:  No edema; No deformity   ASSESSMENT AND PLAN:    PVC's  Prior 15.7% burden on monitor.  -EKG with NSR, stable intervals / no PVC's   -stop Flecainide  given patient c/o's dizziness, vision changes, lightheadedness   -will revisit if PVC's return > anticipate he would warrant trial of CCB, BB (though concern with fatigue as pre-existing issue), or amiodarone -consider monitor at next visit to assess PVC burden    Hypertension  -discussed role of baroreceptors, age related changes &  considering letting him run a bit on the higher side (130-140 systolic) if he is symptomatic at 110's -Cr up on most recent labs > may not tolerate ARB.  Defer to PCP. Discussed with patient that we may have to consider BB as a dual agent (both BP and PVC's though not ideal given pre-existing fatigue).   Dizziness / Lightheadedness / Fatigue / Body Aches  CIDP -encouraged adequate fluid intake as well as food (wife states he does not eat much)  -encouraged compression stockings -discussed role of movement / exercise in overall health & aging  -consider statin as culprit for some of his body  aches   Follow up with [name] or EP APP in 2 weeks to 1 month to assess symptoms off flecainide  and evaluate for PVC burden   Signed, Daphne Barrack, NP-C, AGACNP-BC [facility] - Electrophysiology  05/11/2024, 5:35 PM

## 2024-05-11 NOTE — Telephone Encounter (Signed)
 Patient reports he has been experiencing dizziness, headache and blurred vision. He was seen at ED in [facility] on 6/30, was discharged home with recommendation to F/U with PCP.  Patient reports BP drops when up moving around, goes up when sitting. Does not wear compression socks. He states he has neuropathy and spinal stimulator.   [name] patient--he is scheduled to see Charlies Arthur, PA-C on 05/20/24. Opening this afternoon with Daphne Barrack, NP offered to patient he accepted.

## 2024-05-11 NOTE — Telephone Encounter (Signed)
 Pt c/o BP issue: STAT if pt c/o blurred vision, one-sided weakness or slurred speech.  STAT if BP is GREATER than 180/120 TODAY.  STAT if BP is LESS than 90/60 and SYMPTOMATIC TODAY  1. What is your BP concern? When up moving it drops. When he is sitting it goes up  2. Have you taken any BP medication today?yes  3. What are your last 5 BP readings?137/69 70, 113/65 81  4. Are you having any other symptoms (ex. Dizziness, headache, blurred vision, passed out)? Dizziness, headache, arm and shoulder pain, blurred vision (Pt went ER at [facility] on 6/30)

## 2024-05-19 ENCOUNTER — Encounter: Payer: Self-pay | Admitting: Gastroenterology

## 2024-05-20 ENCOUNTER — Ambulatory Visit: Admitting: Physician Assistant

## 2024-05-23 ENCOUNTER — Other Ambulatory Visit: Payer: Self-pay

## 2024-05-23 ENCOUNTER — Emergency Department (HOSPITAL_COMMUNITY)
Admission: EM | Admit: 2024-05-23 | Discharge: 2024-05-23 | Disposition: A | Discharge status: Home / Self Care | Attending: Emergency Medicine | Admitting: Emergency Medicine

## 2024-05-23 ENCOUNTER — Encounter (HOSPITAL_COMMUNITY): Payer: Self-pay | Admitting: Emergency Medicine

## 2024-05-23 ENCOUNTER — Emergency Department (HOSPITAL_COMMUNITY)

## 2024-05-23 DIAGNOSIS — M791 Myalgia, unspecified site: Secondary | ICD-10-CM | POA: Insufficient documentation

## 2024-05-23 DIAGNOSIS — I700 Atherosclerosis of aorta: Secondary | ICD-10-CM | POA: Diagnosis not present

## 2024-05-23 DIAGNOSIS — N289 Disorder of kidney and ureter, unspecified: Secondary | ICD-10-CM | POA: Diagnosis not present

## 2024-05-23 DIAGNOSIS — I129 Hypertensive chronic kidney disease with stage 1 through stage 4 chronic kidney disease, or unspecified chronic kidney disease: Secondary | ICD-10-CM | POA: Diagnosis not present

## 2024-05-23 DIAGNOSIS — R9389 Abnormal findings on diagnostic imaging of other specified body structures: Secondary | ICD-10-CM | POA: Diagnosis not present

## 2024-05-23 DIAGNOSIS — I10 Essential (primary) hypertension: Secondary | ICD-10-CM | POA: Diagnosis not present

## 2024-05-23 DIAGNOSIS — K5900 Constipation, unspecified: Secondary | ICD-10-CM | POA: Diagnosis not present

## 2024-05-23 DIAGNOSIS — I499 Cardiac arrhythmia, unspecified: Secondary | ICD-10-CM | POA: Diagnosis not present

## 2024-05-23 DIAGNOSIS — N189 Chronic kidney disease, unspecified: Secondary | ICD-10-CM | POA: Diagnosis not present

## 2024-05-23 DIAGNOSIS — G4489 Other headache syndrome: Secondary | ICD-10-CM | POA: Diagnosis not present

## 2024-05-23 DIAGNOSIS — R6883 Chills (without fever): Secondary | ICD-10-CM | POA: Insufficient documentation

## 2024-05-23 LAB — URINALYSIS, W/ REFLEX TO CULTURE (INFECTION SUSPECTED)
Bilirubin Urine: NEGATIVE
Glucose, UA: NEGATIVE mg/dL
Hgb urine dipstick: NEGATIVE
Ketones, ur: NEGATIVE mg/dL
Leukocytes,Ua: NEGATIVE
Nitrite: NEGATIVE
Protein, ur: NEGATIVE mg/dL
Specific Gravity, Urine: 1.009 (ref 1.005–1.030)
pH: 7 (ref 5.0–8.0)

## 2024-05-23 LAB — COMPREHENSIVE METABOLIC PANEL WITH GFR
ALT: 20 U/L (ref 0–44)
AST: 18 U/L (ref 15–41)
Albumin: 3.6 g/dL (ref 3.5–5.0)
Alkaline Phosphatase: 98 U/L (ref 38–126)
Anion gap: 9 (ref 5–15)
BUN: 23 mg/dL (ref 8–23)
CO2: 23 mmol/L (ref 22–32)
Calcium: 9 mg/dL (ref 8.9–10.3)
Chloride: 108 mmol/L (ref 98–111)
Creatinine, Ser: 1.58 mg/dL — ABNORMAL HIGH (ref 0.61–1.24)
GFR, Estimated: 43 mL/min — ABNORMAL LOW (ref >60)
Glucose, Bld: 96 mg/dL (ref 70–99)
Potassium: 4.5 mmol/L (ref 3.5–5.1)
Sodium: 140 mmol/L (ref 135–145)
Total Bilirubin: 0.7 mg/dL (ref 0.0–1.2)
Total Protein: 6.6 g/dL (ref 6.5–8.1)

## 2024-05-23 LAB — CBC WITH DIFFERENTIAL/PLATELET
Abs Immature Granulocytes: 0.02 K/uL (ref 0.00–0.07)
Basophils Absolute: 0 K/uL (ref 0.0–0.1)
Basophils Relative: 1 %
Eosinophils Absolute: 0.1 K/uL (ref 0.0–0.5)
Eosinophils Relative: 2 %
HCT: 41.6 % (ref 39.0–52.0)
Hemoglobin: 13.5 g/dL (ref 13.0–17.0)
Immature Granulocytes: 0 %
Lymphocytes Relative: 19 %
Lymphs Abs: 1.2 K/uL (ref 0.7–4.0)
MCH: 32 pg (ref 26.0–34.0)
MCHC: 32.5 g/dL (ref 30.0–36.0)
MCV: 98.6 fL (ref 80.0–100.0)
Monocytes Absolute: 0.5 K/uL (ref 0.1–1.0)
Monocytes Relative: 8 %
Neutro Abs: 4.2 K/uL (ref 1.7–7.7)
Neutrophils Relative %: 70 %
Platelets: 157 K/uL (ref 150–400)
RBC: 4.22 MIL/uL (ref 4.22–5.81)
RDW: 13.2 % (ref 11.5–15.5)
WBC: 6.1 K/uL (ref 4.0–10.5)
nRBC: 0 % (ref 0.0–0.2)

## 2024-05-23 LAB — RESP PANEL BY RT-PCR (RSV, FLU A&B, COVID)  RVPGX2
Influenza A by PCR: NEGATIVE
Influenza B by PCR: NEGATIVE
Resp Syncytial Virus by PCR: NEGATIVE
SARS Coronavirus 2 by RT PCR: NEGATIVE

## 2024-05-23 MED ORDER — ACETAMINOPHEN 325 MG PO TABS
650.0000 mg | ORAL_TABLET | Freq: Once | ORAL | Status: AC
  Administered 2024-05-23: 650 mg via ORAL
  Filled 2024-05-23: qty 2

## 2024-05-23 MED ORDER — TRAMADOL HCL 50 MG PO TABS
50.0000 mg | ORAL_TABLET | Freq: Once | ORAL | Status: AC
  Administered 2024-05-23: 50 mg via ORAL
  Filled 2024-05-23: qty 1

## 2024-05-23 NOTE — Discharge Instructions (Signed)
 Your evaluation did not show a clear cause for why you are having the symptoms.   Please drink plenty of fluids.  Give acetaminophen  and/or tramadol  as needed for pain.  Return to the emergency department for new or concerning symptoms.

## 2024-05-23 NOTE — ED Triage Notes (Signed)
 Pt BIBA from home, w/ c/o malaise, recent changes in bp meds, headache 8/10, also endorses constipation.   EMS Vitals-  BP 156/76 HR 64 RR 16 98% RA CBG 105

## 2024-05-23 NOTE — ED Provider Notes (Signed)
 Patient signed seen by Dr. Raford pending improved pain management.  Was given tramadol .  At time of reassessment patient does well better.  Patient very frustrated that he has had the symptoms multiple times without etiology.  I encouraged the patient to follow-up with his primary care doctor this week.  States he has adequate supply of tramadol .   Alex Faden, MD 05/23/24 [phone]

## 2024-05-23 NOTE — ED Provider Notes (Signed)
 [facility]ERGENC[facility] DEPARTMENT AT [facility] Provider Note   CSN: [id number] Arrival date & time: 05/23/24  [id number]     Patient presents with: Headache and Fatigue   Alex Dunn is a 88 y.o. male.   The history is provided by the patient.  Headache He has history of hypertension, chronic kidney disease, nonsustained ventricular tachycardia, monoclonal gammopathy of unknown significance and comes in because of generalized bodyaches which started this morning.  He has had associated chills but denies fever or sweats.  He denies sore throat or cough.  He has been having this problem intermittently for several months.     Prior to Admission medications   Medication Sig Start Date End Date Taking? Authorizing Provider  acetaminophen  (TYLENOL ) 325 MG tablet Take 650 mg by mouth 4 (four) times daily as needed for mild pain or moderate pain (pain).    [provider]  aspirin  EC 81 MG tablet Take 81 mg by mouth daily. Swallow whole.    [provider]  atorvastatin  (LIPITOR) 10 MG tablet TAKE 1 TABLET(10 MG) BY MOUTH DAILY 07/08/23   Wendling, Mabel Mt, DO  Cholecalciferol (VITAMIN D ) 50 MCG (2000 UT) CAPS Take 2,000 Units by mouth daily at 6 (six) AM.    [provider]  cyanocobalamin  1000 MCG tablet Take 1,000 mcg by mouth daily.    [provider]  fluticasone  (FLONASE ) 50 MCG/ACT nasal spray Place 2 sprays into both nostrils daily. 03/19/23   Frann Mabel Mt, DO  Multiple Vitamins-Minerals (PRESERVISION AREDS) CAPS Take 1 capsule by mouth daily. 06/21/20   [provider]  olmesartan  (BENICAR ) 20 MG tablet Take 1 tablet (20 mg total) by mouth daily. 05/04/24   Frann Mabel Mt, DO  pantoprazole  (PROTONIX ) 40 MG tablet Take 1 tablet (40 mg total) by mouth daily as needed. 08/27/22   Frann Mabel Mt, DO  traMADol  (ULTRAM ) 50 MG tablet Take 1 tablet (50 mg total) by mouth every 12 (twelve) hours as needed.  04/05/24   Frann Mabel Mt, DO  triamcinolone  cream (KENALOG ) 0.1 % Apply 1 Application topically 2 (two) times daily. 05/10/24   Frann Mabel Mt, DO    Allergies: Amitriptyline, Codeine phosphate, Pregabalin, Tamsulosin , Tape, and Lexapro  [escitalopram  oxalate]    Review of Systems  Neurological:  Positive for headaches.  All other systems reviewed and are negative.   Updated Vital Signs BP (!) 163/85   Pulse 70   Temp 98.3 F (36.8 C)   Resp 16   SpO2 98%   Physical Exam Vitals and nursing note reviewed.   88 year old male, resting comfortably and in no acute distress. Vital signs are significant for elevated blood pressure. Oxygen saturation is 98%, which is normal. Head is normocephalic and atraumatic. PERRLA, EOMI. Oropharynx is clear. Neck is nontender and supple without adenopathy. Lungs are clear without rales, wheezes, or rhonchi. Chest is nontender. Heart has regular rate and rhythm without murmur. Abdomen is soft, flat, nontender. Skin is warm and dry without rash. Neurologic: Mental status is normal, cranial nerves are intact, moves all extremities equally.  (all labs ordered are listed, but only abnormal results are displayed) Labs Reviewed  COMPREHENSIVE METABOLIC PANEL WITH GFR - Abnormal; Notable for the following components:      Result Value   Creatinine, Ser 1.58 (*)    GFR, Estimated 43 (*)    All other components within normal limits  URINALYSIS, W/ REFLEX TO CULTURE (INFECTION SUSPECTED) - Abnormal; Notable for  the following components:   Color, Urine STRAW (*)    Bacteria, UA RARE (*)    All other components within normal limits  RESP PANEL BY RT-PCR (RSV, FLU A&B, COVID)  RVPGX2  CBC WITH DIFFERENTIAL/PLATELET    Radiology: [facility] Chest Port 1 View Result Date: 05/23/2024 CLINICAL DATA:  Myalgia. EXAM: PORTABLE CHEST 1 VIEW COMPARISON:  05/10/2024 FINDINGS: Heart size is normal. Aortic atherosclerosis. Asymmetric elevation of the right  hemidiaphragm. Lungs are clear. No pleural fluid, interstitial edema. No airspace opacities. IMPRESSION: 1. No acute findings. 2. Asymmetric elevation of the right hemidiaphragm. Electronically Signed   By: Waddell Calk M.D.   On: 05/23/2024 05:42     Procedures   Medications Ordered in the ED - No data to display                                  Medical Decision Making Amount and/or Complexity of Data Reviewed Labs: ordered. Radiology: ordered.  Risk OTC drugs. Prescription drug management.   Chills and bodyaches concerning for viral illness such as influenza or COVID-19.  However, per patient's history, this is also something that has been recurrent, and specific etiology is not clear.  I have ordered acetaminophen  for pain and I have ordered viral PCR respiratory pathogen panel as well as chest x-ray and screening labs.  Chest x-ray shows no acute findings, stable elevation of the right hemidiaphragm.  Have independently viewed the image, and agree with radiologist interpretation.  I reviewed his laboratory test, and my interpretation is stable renal insufficiency, normal CBC, normal transaminases, normal urinalysis, negative respiratory PCR panel.  Patient states that he is continuing to ache all over and is frustrated that he has not been able to get an answer.  He has taken tramadol  in the past and it generally gives him relief.  I have ordered a dose of tramadol .  If he gets sufficient relief, plan will be to discharge him to follow-up with PCP, consider referral to tertiary care center for further evaluation.  Case is signed out to Dr. Dasie.     Final diagnoses:  Chills  Myalgia  Renal insufficiency    ED Discharge Orders     None          Raford Lenis, MD 05/23/24 [phone]

## 2024-05-24 ENCOUNTER — Ambulatory Visit: Payer: Self-pay

## 2024-05-24 NOTE — Telephone Encounter (Signed)
 No available PCP or office appointments, please ask PCP to advise on recommendations - pt willing to come in today or go to a different hospital if PCP recommends.   Pt seen in ED for urinary frequency, chills, body aches. No cause identified, pt d/c'd with Tramadol  and advised to f/u with PCP.  FYI Only or Action Required?: Action required by provider: request for appointment, clinical question for provider, and update on patient condition.  Patient was last seen in primary care on 05/10/2024 by Frann Mabel Mt, DO.  Called Nurse Triage reporting No chief complaint on file..  Symptoms began several days ago.  Interventions attempted: Prescription medications: Tramadol .  Symptoms are: unchanged.  Triage Disposition: Callback by PCP Today  Patient/caregiver understands and will follow disposition?: Yes  Copied from CRM [id number]. Topic: Clinical - Red Word Triage >> May 24, 2024  9:09 AM Treva T wrote: [facility] that prompted transfer to Nurse Triage: Patient spouse calling, Dorothy, [place] verified, states patent is having ongoing fever, chills, and urinary frequency. Has also experienced body pain, Patient spouse reports was seen in hospital [facility]) over the weekend, and discharged. Reason for Disposition  [1] MODERATE weakness (e.g., interferes with work, school, normal activities) AND [2] cause unknown  (Exceptions: Weakness from acute minor illness or poor fluid intake; weakness is chronic and not worse.)  Answer Assessment - Initial Assessment Questions 1. DESCRIPTION: Describe how you are feeling.     Body aches, urinary frequency/producing small amount, denies pain with urination, denies flank pain 2. SEVERITY: How bad is it?  Can you stand and walk?     Was 10/10 when he went to the ED, states today feeling too weak to walk 3. ONSET: When did these symptoms begin? (e.g., hours, days, weeks, months)     7/20 5. NEW MEDICINES:  Have you started on any new  medicines recently? (e.g., opioid pain medicines, benzodiazepines, muscle relaxants, antidepressants, antihistamines, neuroleptics, beta blockers)     Rx tramadol  by ED 6. OTHER SYMPTOMS: Do you have any other symptoms? (e.g., chest pain, fever, cough, SOB, vomiting, diarrhea, bleeding, other areas of pain)     No  Protocols used: Weakness (Generalized) and Fatigue-A-AH

## 2024-05-26 ENCOUNTER — Ambulatory Visit: Admitting: Family Medicine

## 2024-05-27 ENCOUNTER — Ambulatory Visit (INDEPENDENT_AMBULATORY_CARE_PROVIDER_SITE_OTHER): Admitting: Family Medicine

## 2024-05-27 ENCOUNTER — Encounter: Payer: Self-pay | Admitting: Family Medicine

## 2024-05-27 VITALS — BP 134/76 | HR 74 | Temp 98.0°F | Resp 16 | Ht 73.0 in | Wt 181.0 lb

## 2024-05-27 DIAGNOSIS — N401 Enlarged prostate with lower urinary tract symptoms: Secondary | ICD-10-CM

## 2024-05-27 DIAGNOSIS — I10 Essential (primary) hypertension: Secondary | ICD-10-CM | POA: Diagnosis not present

## 2024-05-27 DIAGNOSIS — E538 Deficiency of other specified B group vitamins: Secondary | ICD-10-CM

## 2024-05-27 DIAGNOSIS — M791 Myalgia, unspecified site: Secondary | ICD-10-CM

## 2024-05-27 DIAGNOSIS — K5900 Constipation, unspecified: Secondary | ICD-10-CM

## 2024-05-27 DIAGNOSIS — R351 Nocturia: Secondary | ICD-10-CM

## 2024-05-27 NOTE — Patient Instructions (Addendum)
 Stay hydrated.  Give us  4-5 business days to get the results of your labs back.   Foods that may reduce pain: 1) Ginger 2) Blueberries 3) Salmon 4) Pumpkin seeds 5) Dark chocolate 6) Turmeric 7) Tart cherries 8) Virgin olive oil 9) Chili peppers 10) Mint 11) Krill oil  Try 2 tablespoons of milk of mag in 4 oz of warm prune juice. Do that and wait a couple hours. If no improvement, try a Dulcolax suppository and then let me know if we are still having issues.   Take Metamucil or Benefiber daily.  Let us  know if you need anything.

## 2024-05-27 NOTE — Progress Notes (Signed)
 Chief Complaint  Alex Dunn presents with   Follow-up    ER Follow Up    Subjective: Alex Dunn is a 88 y.o. male here for f/u.  He is here with his spouse who helps with the history.  Alex Dunn went to the ER on 05/23/2024 for myalgias.  Over the past 6 years, he will begin intermittent symptoms lasting relief.  He has seen several specialists without significant improvement.  He had blood work in the past suggesting lupus.  No obvious rashes.  He did see the rheumatologist.  Historically prednisone  has helped.  He is currently prescribed tramadol  which does help.  Usually no adverse effects with this.  He has a history of BPH with nocturia.  He has failed Flomax  due to lightheadedness and finasteride  due to lack of improvement.  He is urinating every 2 hours at night.  He does not urinate as frequently during the day.  No pain, discharge, or bleeding.  Alex Dunn has a history of constipation.  Sometimes he has to take a suppository to facilitate a bowel movement.  He is on tramadol  as noted above.  He stays well-hydrated overall.  Diet is fair.  Alex Dunn has a history of high blood pressure.  He is currently taking olmesartan  10 mg daily.  He still gets periods of lightheadedness and his blood pressure was dropped to the low 100s.  No chest pain or shortness of breath.  Diet is fair overall.  He does not exercise routinely.  Past Medical History:  Diagnosis Date   Abdominal aortic atherosclerosis (HCC) 04/15/2018   ANEMIA-IRON DEFICIENCY 06/23/2007   B12 deficiency 06/13/2020   BPH (benign prostatic hyperplasia) 06/23/2007   Qualifier: Diagnosis of  By: Nunzio, RN, Dagoberto Caldron   Formatting of this note might be different from the original. Formatting of this note might be different from the original. Qualifier: Diagnosis of  By: Nunzio, RN, Dagoberto Caldron   Degeneration of lumbar intervertebral disc 06/30/2018   Diverticulosis    Elevated diaphragm 04/15/2018   Essential hypertension 04/15/2018    Gastro-esophageal reflux disease without esophagitis 06/23/2007   Qualifier: Diagnosis of  By: Nunzio, RN, Dagoberto Caldron   Formatting of this note might be different from the original. Formatting of this note might be different from the original. Qualifier: Diagnosis of  By: Nunzio, RN, Dagoberto Caldron   Lumbar radiculopathy 05/06/2019   Lumbosacral spondylosis without myelopathy 03/10/2019   Monoclonal gammopathy of unknown significance (MGUS) 08/24/2018   Multiple renal cysts 04/06/2018   Nonsustained ventricular tachycardia (HCC) 11/03/2019   Primary osteoarthritis of both knees 06/30/2018   Bilateral moderate with moderate chondromalacia patella Formatting of this note might be different from the original. Formatting of this note might be different from the original. Bilateral moderate with moderate chondromalacia patella   Stage 3b chronic kidney disease (HCC) 06/21/2020    Objective: BP 134/76 (BP Location: Left Arm, Cuff Size: Normal)   Pulse 74   Temp 98 F (36.7 C) (Oral)   Resp 16   Ht 6' 1 (1.854 m)   Wt 181 lb (82.1 kg)   SpO2 96%   BMI 23.88 kg/m  General: Awake, appears stated age Heart: RRR, no LE edema Lungs: CTAB, no rales, wheezes or rhonchi. No accessory muscle use Mouth: MMM Skin: No skin lesions on exposed skin. MSK: No TTP over the lumbar or thoracic paraspinal musculature.  No midline TTP.  No obvious asymmetry or muscular atrophy. Neuro: DTRs equal and symmetric throughout, no clonus, gait is slow/cautious Psych:  Age appropriate judgment and insight, normal affect and mood  Assessment and Plan: Myalgia - Plan: Antinuclear Antib (ANA), Anti-Smith antibody, Anti-DNA antibody, double-stranded, Rheumatoid Factor, Aldolase, Sedimentation rate, Cyclic citrul peptide antibody, IgG ([facility])  Nocturia associated with benign prostatic hyperplasia  Constipation, unspecified constipation type - Plan: VITAMIN D  25 Hydroxy (Vit-D Deficiency, Fractures)  B12 deficiency - Plan:  B12  Essential hypertension  Chronic, not currently controlled.  Check above labs.  Positive, will refer to hematology./Supplements associated warmth emotional provided.  Pain, ice, Tylenol , continue tramadol  as needed.   Offered referral to urology which she politely declined.  He would like to see how his constipation goes. This is likely an adverse effect of the tramadol  and lack of fiber.  He will increase hydration and add a fiber supplement daily.  2 tablespoons of milk of magnesia +4 ounces of warm prune juice on an as-needed basis. Follow-up with vitamin B12. Monitor blood pressure at home.  He will stop his olmesartan  and see if he is still having lightheaded episodes.  Agreed to have a 2-2 low at this point.  Follow-up in 2 weeks regarding this. The Alex Dunn and his spouse voiced understanding and agreement to the plan.  I spent 48 minutes with the Alex Dunn discussing the above plans in addition to reviewing his chart on the same day of the visit.  Mabel Mt Bunnell, DO 05/27/24  3:54 PM

## 2024-05-28 ENCOUNTER — Ambulatory Visit: Payer: Self-pay | Admitting: Family Medicine

## 2024-05-28 LAB — SEDIMENTATION RATE: Sed Rate: 21 mm/h — ABNORMAL HIGH (ref 0–20)

## 2024-05-28 LAB — VITAMIN B12: Vitamin B-12: 553 pg/mL (ref 211–911)

## 2024-05-28 LAB — VITAMIN D 25 HYDROXY (VIT D DEFICIENCY, FRACTURES): VITD: 36.7 ng/mL (ref 30.00–100.00)

## 2024-05-29 MED ORDER — PREDNISONE 20 MG PO TABS: 40.0000 mg | ORAL_TABLET | Freq: Every day | ORAL | 0 refills | Status: AC

## 2024-05-30 LAB — ANTI-NUCLEAR AB-TITER (ANA TITER): ANA Titer 1: 1:80 {titer} — ABNORMAL HIGH

## 2024-05-30 LAB — ANTI-DNA ANTIBODY, DOUBLE-STRANDED: ds DNA Ab: 53 [IU]/mL — ABNORMAL HIGH

## 2024-05-30 LAB — RHEUMATOID FACTOR: Rheumatoid fact SerPl-aCnc: <10 [IU]/mL (ref <14)

## 2024-05-30 LAB — ANTI-SMITH ANTIBODY: ENA SM Ab Ser-aCnc: <1 AI

## 2024-05-30 LAB — ANA: Anti Nuclear Antibody (ANA): POSITIVE — AB

## 2024-05-30 LAB — CYCLIC CITRUL PEPTIDE ANTIBODY, IGG: Cyclic Citrullin Peptide Ab: <16 U

## 2024-05-30 LAB — ALDOLASE

## 2024-05-31 ENCOUNTER — Other Ambulatory Visit: Payer: Self-pay

## 2024-05-31 ENCOUNTER — Other Ambulatory Visit

## 2024-05-31 DIAGNOSIS — M791 Myalgia, unspecified site: Secondary | ICD-10-CM

## 2024-05-31 DIAGNOSIS — R768 Other specified abnormal immunological findings in serum: Secondary | ICD-10-CM

## 2024-05-31 NOTE — Addendum Note (Signed)
 Addended by: TRUDY CURVIN RAMAN on: 05/31/2024 08:35 AM   Modules accepted: Orders

## 2024-06-02 LAB — ALDOLASE: Aldolase: 4.6 U/L (ref <=8.1)

## 2024-06-10 NOTE — Progress Notes (Unsigned)
 Electrophysiology Office Note:   Date:  06/11/2024  ID:  Alex Dunn, DOB 1936/07/31, MRN [id number]  Primary Cardiologist: None Primary Heart Failure: None Electrophysiologist: Danelle Birmingham, MD      History of Present Illness:   Alex Dunn is a 88 y.o. male with h/o PVC's, NSVT, HTN, HLD, orthostatic hypotension, TIA, IDA, GERD, CKD III, MGUS, CIDP  seen today for routine electrophysiology followup.   Seen in EP Clinic 05/11/24 with feeling poorly, weak, elevated BP.  He subsequently went to the ER on 05/23/24 with body aches & chills for several months.  He was given tramadol  for pain, work up was largely negative. He is frustrated and feels as thought healthcare providers can not figure out why he feels bad. He saw his PCP and a lot of labs were ordered.  He reports his DS-DNA was positive. He has been referred to Rheumatology. His PCP also took him off all of his BP agents.    Since last being seen in our clinic the patient reports he continues to feel poorly.  Wakes in the middle of the night around 0200 with body aches / pain. He takes a tramadol  or tylenol  and it helps.    He denies chest pain, palpitations, dyspnea, PND, orthopnea, nausea, vomiting, dizziness, syncope, edema, weight gain, or early satiety.   Review of systems complete and found to be negative unless listed in HPI.   EP Information / Studies Reviewed:    EKG is not ordered today. EKG from 05/23/24 reviewed which showed SR 65 bpm with PAC      Studies:  Cardiac Monitor 11/2020 > predominantly NSR, 4 VT runs, 4 SVT runs, 15.7% PVC burden ECHO 02/2021 > LVEF 60-65%, G1DD, mild MV regurgitation, mild late systolic prolapse of multiple scallops of the posterior leaflet of the MV   Arrhythmia / AAD PVC's  Flecainide  > stopped 05/11/24 due to dizziness / lightheadedness / vision changes      Physical Exam:   VS:  BP (!) 158/68   Pulse 77   Ht 6' 1 (1.854 m)   Wt 184 lb (83.5 kg)   SpO2 96%   BMI 24.28  kg/m    Wt Readings from Last 3 Encounters:  06/11/24 184 lb (83.5 kg)  05/27/24 181 lb (82.1 kg)  05/11/24 184 lb 12.8 oz (83.8 kg)     GEN: Well nourished, well developed in no acute distress NECK: No JVD; No carotid bruits CARDIAC: Regular rate and rhythm, no murmurs, rubs, gallops - no PVC's on exam  RESPIRATORY:  Clear to auscultation without rales, wheezing or rhonchi  ABDOMEN: Soft, non-tender, non-distended EXTREMITIES:  No edema; No deformity   ASSESSMENT AND PLAN:    PVC's Prior burden 15.7% on monitor  -flecainide  stopped due to above symptoms -recent EKG from ER with no PVC's  -no symptom burden of PVC's > reports his BP cuff has not reported any -assess monitor to quantify burden off flecainide   -if PVC's return, consider trial of CCB, BB > though concern with pre-existing fatigue that this may limit treatment. Could also consider mexiletine.   Hypertension  -elevated in clinic, BP agents stopped per PCP  -consider allowing BP to run 130-140 systolic given symptoms of dizziness  -may need PRN agent for BP control > does not feel better off his BP regimen  Dizziness / Lightheadedness  Fatigue / Body Aches  CIDP ? - Follows with Dr. [name] - Follows with Dr. [name] DS-DNA  Depression  -encourage adequate PO intake  -compression stockings  -? If statins are some of his body aches > discussed referring him to the lipid clinic for repatha consideration. He feels he felt bad before he started statins.  Discussed if it eliminates concern it is at least one thing off the list as a cause his symptoms -reviewed DS-DNA is only one isolated lab and it can be very difficult to dx autoimmune processes, he is pending Rheumatology  -consider evaluation by Geriatrics if no answers from Rheumatology but would defer to PCP  -pt reports he is getting depressed, has memory issues and feels very frustrated > reviewed talking with his PCP about Cymbalta  or similar ?? May  help with pain & depressive symptoms.  Denies risk of self harm.    Follow up with [name] or EP APP in 6 months  Signed, Daphne Barrack, NP-C, AGACNP-BC [facility] - Electrophysiology  06/11/2024, 2:55 PM

## 2024-06-11 ENCOUNTER — Encounter: Payer: Self-pay | Admitting: Pulmonary Disease

## 2024-06-11 ENCOUNTER — Ambulatory Visit: Attending: Pulmonary Disease | Admitting: Pulmonary Disease

## 2024-06-11 ENCOUNTER — Ambulatory Visit: Attending: Pulmonary Disease

## 2024-06-11 VITALS — BP 158/68 | HR 77 | Ht 73.0 in | Wt 184.0 lb

## 2024-06-11 DIAGNOSIS — I493 Ventricular premature depolarization: Secondary | ICD-10-CM

## 2024-06-11 DIAGNOSIS — E785 Hyperlipidemia, unspecified: Secondary | ICD-10-CM | POA: Diagnosis not present

## 2024-06-11 DIAGNOSIS — I10 Essential (primary) hypertension: Secondary | ICD-10-CM | POA: Diagnosis not present

## 2024-06-11 NOTE — Progress Notes (Unsigned)
 Enrolled patient for a 14 day Zio XT monitor to be mailed to patients home  Waddell to read  PATIENT HAS A SPINAL STIMULATOR.  IF ZIO PATCH NOT INTERPRETABLE DUE TO 60 CYCLE INTERFERENCE, CANCEL CHARGES ON MONITOR.  MONITOR WILL BE INTERPRETABLE IF IT SHOWS A PVC BURDEN AND/ OR DOCUMENT ANY RUNS OF NSVT.

## 2024-06-11 NOTE — Patient Instructions (Addendum)
 Medication Instructions:  Your physician recommends that you continue on your current medications as directed. Please refer to the Current Medication list given to you today.  *If you need a refill on your cardiac medications before your next appointment, please call your pharmacy*  Lab Work: None ordered If you have labs (blood work) drawn today and your tests are completely normal, you will receive your results only by: MyChart Message (if you have MyChart) OR A paper copy in the mail If you have any lab test that is abnormal or we need to change your treatment, we will call you to review the results.  Testing/Procedures: GEOFFRY HEWS- Long Term Monitor Instructions  Your physician has requested you wear a ZIO patch monitor for 14 days.  This is a single patch monitor. [employer] supplies one patch monitor per enrollment. Additional stickers are not available. Please do not apply patch if you will be having a Nuclear Stress Test,  Echocardiogram, Cardiac CT, MRI, or Chest Xray during the period you would be wearing the  monitor. The patch cannot be worn during these tests. You cannot remove and re-apply the  ZIO XT patch monitor.  Your ZIO patch monitor will be mailed 3 day USPS to your address on file. It may take 3-5 days  to receive your monitor after you have been enrolled.  Once you have received your monitor, please review the enclosed instructions. Your monitor  has already been registered assigning a specific monitor serial # to you.  Billing and Patient Assistance Program Information  We have supplied [employer] with any of your insurance information on file for billing purposes. [employer] offers a sliding scale Patient Assistance Program for patients that do not have  insurance, or whose insurance does not completely cover the cost of the ZIO monitor.  You must apply for the Patient Assistance Program to qualify for this discounted rate.  To apply, please call [employer] at [phone],  select option 4, select option 2, ask to apply for  Patient Assistance Program. Meredeth will ask your household income, and how many people  are in your household. They will quote your out-of-pocket cost based on that information.  [employer] will also be able to set up a 72-month, interest-free payment plan if needed.  Applying the monitor   Shave hair from upper left chest.  Hold abrader disc by orange tab. Rub abrader in 40 strokes over the upper left chest as  indicated in your monitor instructions.  Clean area with 4 enclosed alcohol pads. Let dry.  Apply patch as indicated in monitor instructions. Patch will be placed under collarbone on left  side of chest with arrow pointing upward.  Rub patch adhesive wings for 2 minutes. Remove white label marked 1. Remove the white  label marked 2. Rub patch adhesive wings for 2 additional minutes.  While looking in a mirror, press and release button in center of patch. A small green light will  flash 3-4 times. This will be your only indicator that the monitor has been turned on.  Do not shower for the first 24 hours. You may shower after the first 24 hours.  Press the button if you feel a symptom. You will hear a small click. Record Date, Time and  Symptom in the Patient Logbook.  When you are ready to remove the patch, follow instructions on the last 2 pages of Patient  Logbook. Stick patch monitor onto the last page of Patient Logbook.  Place Patient Logbook in the  blue and white box. Use locking tab on box and tape box closed  securely. The blue and white box has prepaid postage on it. Please place it in the mailbox as  soon as possible. Your physician should have your test results approximately 7 days after the  monitor has been mailed back to [facility].  Call [facility] Customer Care at [phone] if you have questions regarding  your ZIO XT patch monitor. Call them immediately if you see an orange light blinking on your   monitor.  If your monitor falls off in less than 4 days, contact our Monitor department at [phone].  If your monitor becomes loose or falls off after 4 days call [employer] at [phone] for  suggestions on securing your monitor   Follow-Up: At [facility], you and your health needs are our priority.  As part of our continuing mission to provide you with exceptional heart care, our providers are all part of one team.  This team includes your primary Cardiologist (physician) and Advanced Practice Providers or APPs (Physician Assistants and Nurse Practitioners) who all work together to provide you with the care you need, when you need it.  Your next appointment:   6 months  Provider:   Daphne Barrack, NP   You have been referred to the lipid clinic.

## 2024-06-14 ENCOUNTER — Ambulatory Visit: Admitting: Family Medicine

## 2024-06-15 ENCOUNTER — Telehealth: Admitting: Physician Assistant

## 2024-06-15 ENCOUNTER — Ambulatory Visit: Payer: Self-pay

## 2024-06-15 ENCOUNTER — Other Ambulatory Visit (HOSPITAL_BASED_OUTPATIENT_CLINIC_OR_DEPARTMENT_OTHER): Payer: Self-pay

## 2024-06-15 ENCOUNTER — Encounter: Payer: Self-pay | Admitting: Physician Assistant

## 2024-06-15 DIAGNOSIS — U071 COVID-19: Secondary | ICD-10-CM

## 2024-06-15 MED ORDER — NIRMATRELVIR/RITONAVIR (PAXLOVID) TABLET (RENAL DOSING): 2.0000 | ORAL_TABLET | Freq: Two times a day (BID) | ORAL | 0 refills | Status: DC

## 2024-06-15 MED ORDER — NIRMATRELVIR/RITONAVIR (PAXLOVID) TABLET (RENAL DOSING)
2.0000 | ORAL_TABLET | Freq: Two times a day (BID) | ORAL | 0 refills | Status: AC
  Filled 2024-06-15: qty 20, 5d supply, fill #0

## 2024-06-15 MED ORDER — BENZONATATE 100 MG PO CAPS: 100.0000 mg | ORAL_CAPSULE | Freq: Three times a day (TID) | ORAL | 0 refills | Status: DC | PRN

## 2024-06-15 NOTE — Telephone Encounter (Signed)
 Telehealth appt scheduled.

## 2024-06-15 NOTE — Patient Instructions (Signed)
 Alex Dunn, thank you for joining [name], PA-C for today's virtual visit.  While this provider is not your primary care provider (PCP), if your PCP is located in our provider database this encounter information will be shared with them immediately following your visit.   A [facility] MyChart account gives you access to today's visit and all your visits, tests, and labs performed at [facility]  click here if you don't have a [facility] MyChart account or go to [email or web address]  Consent: (Patient) Alex Dunn provided verbal consent for this virtual visit at the beginning of the encounter.  Current Medications:  Current Outpatient Medications:    benzonatate  (TESSALON ) 100 MG capsule, Take 1 capsule (100 mg total) by mouth 3 (three) times daily as needed for cough., Disp: 30 capsule, Rfl: 0   nirmatrelvir /ritonavir , renal dosing, (PAXLOVID ) 10 x 150 MG & 10 x 100MG  TABS, Take 2 tablets by mouth 2 (two) times daily for 5 days. (Take nirmatrelvir  150 mg one tablet twice daily for 5 days and ritonavir  100 mg one tablet twice daily for 5 days) Patient GFR is 45, Disp: 20 tablet, Rfl: 0   acetaminophen  (TYLENOL ) 325 MG tablet, Take 650 mg by mouth 4 (four) times daily as needed for mild pain or moderate pain (pain)., Disp: , Rfl:    aspirin  EC 81 MG tablet, Take 81 mg by mouth daily. Swallow whole., Disp: , Rfl:    atorvastatin  (LIPITOR) 10 MG tablet, TAKE 1 TABLET(10 MG) BY MOUTH DAILY, Disp: 90 tablet, Rfl: 3   Cholecalciferol (VITAMIN D ) 50 MCG (2000 UT) CAPS, Take 2,000 Units by mouth daily at 6 (six) AM., Disp: , Rfl:    cyanocobalamin  1000 MCG tablet, Take 1,000 mcg by mouth daily., Disp: , Rfl:    fluticasone  (FLONASE ) 50 MCG/ACT nasal spray, Place 2 sprays into both nostrils daily. (Patient taking differently: Place 2 sprays into both nostrils as needed for allergies or rhinitis.), Disp: 16 g, Rfl: 6   Multiple Vitamins-Minerals (PRESERVISION  AREDS) CAPS, Take 1 capsule by mouth daily., Disp: , Rfl:    pantoprazole  (PROTONIX ) 40 MG tablet, Take 1 tablet (40 mg total) by mouth daily as needed., Disp: 90 tablet, Rfl: 3   traMADol  (ULTRAM ) 50 MG tablet, Take 1 tablet (50 mg total) by mouth every 12 (twelve) hours as needed., Disp: 90 tablet, Rfl: 1   triamcinolone  cream (KENALOG ) 0.1 %, Apply 1 Application topically 2 (two) times daily., Disp: 30 g, Rfl: 0   Medications ordered in this encounter:  Meds ordered this encounter  Medications   benzonatate  (TESSALON ) 100 MG capsule    Sig: Take 1 capsule (100 mg total) by mouth 3 (three) times daily as needed for cough.    Dispense:  30 capsule    Refill:  0    Supervising Provider:   BLAISE ALEENE KIDD [[id number]]   nirmatrelvir /ritonavir , renal dosing, (PAXLOVID ) 10 x 150 MG & 10 x 100MG  TABS    Sig: Take 2 tablets by mouth 2 (two) times daily for 5 days. (Take nirmatrelvir  150 mg one tablet twice daily for 5 days and ritonavir  100 mg one tablet twice daily for 5 days) Patient GFR is 45    Dispense:  20 tablet    Refill:  0    Supervising Provider:   BLAISE ALEENE KIDD [phone]     *If you need refills on other medications prior to your next appointment, please contact your pharmacy*  Follow-Up: Call back or  seek an in-person evaluation if the symptoms worsen or if the condition fails to improve as anticipated.  [facility] [phone]  Care Instructions: Please keep well-hydrated and get plenty of rest. Start a saline nasal rinse to flush out your nasal passages. You can use plain Mucinex to help thin congestion. Take the Tessalon  as directed. If you have a humidifier, running in the bedroom at night. Please take prescribed medications as directed.      Isolation Instructions: You are to isolate at home until you have been fever free for at least 24 hours without a fever-reducing medication, and symptoms have been steadily improving for 24 hours. At that  time,  you can end isolation but need to mask for an additional 5 days.   If you must be around other household members who do not have symptoms, you need to make sure that both you and the family members are masking consistently with a high-quality mask.  If you note any worsening of symptoms despite treatment, please seek an in-person evaluation ASAP. If you note any significant shortness of breath or any chest pain, please seek ER evaluation. Please do not delay care!   COVID-19: What to Do if You Are Sick If you test positive and are an older adult or someone who is at high risk of getting very sick from COVID-19, treatment may be available. Contact a healthcare provider right away after a positive test to determine if you are eligible, even if your symptoms are mild right now. You can also visit a Test to Treat location and, if eligible, receive a prescription from a provider. Don't delay: Treatment must be started within the first few days to be effective. If you have a fever, cough, or other symptoms, you might have COVID-19. Most people have mild illness and are able to recover at home. If you are sick: Keep track of your symptoms. If you have an emergency warning sign (including trouble breathing), call 911. Steps to help prevent the spread of COVID-19 if you are sick If you are sick with COVID-19 or think you might have COVID-19, follow the steps below to care for yourself and to help protect other people in your home and community. Stay home except to get medical care Stay home. Most people with COVID-19 have mild illness and can recover at home without medical care. Do not leave your home, except to get medical care. Do not visit public areas and do not go to places where you are unable to wear a mask. Take care of yourself. Get rest and stay hydrated. Take over-the-counter medicines, such as acetaminophen , to help you feel better. Stay in touch with your doctor. Call before you get  medical care. Be sure to get care if you have trouble breathing, or have any other emergency warning signs, or if you think it is an emergency. Avoid public transportation, ride-sharing, or taxis if possible. Get tested If you have symptoms of COVID-19, get tested. While waiting for test results, stay away from others, including staying apart from those living in your household. Get tested as soon as possible after your symptoms start. Treatments may be available for people with COVID-19 who are at risk for becoming very sick. Don't delay: Treatment must be started early to be effective--some treatments must begin within 5 days of your first symptoms. Contact your healthcare provider right away if your test result is positive to determine if you are eligible. Self-tests are one of several  options for testing for the virus that causes COVID-19 and may be more convenient than laboratory-based tests and point-of-care tests. Ask your healthcare provider or your local health department if you need help interpreting your test results. You can visit your state, tribal, local, and territorial health department's website to look for the latest local information on testing sites. Separate yourself from other people As much as possible, stay in a specific room and away from other people and pets in your home. If possible, you should use a separate bathroom. If you need to be around other people or animals in or outside of the home, wear a well-fitting mask. Tell your close contacts that they may have been exposed to COVID-19. An infected person can spread COVID-19 starting 48 hours (or 2 days) before the person has any symptoms or tests positive. By letting your close contacts know they may have been exposed to COVID-19, you are helping to protect everyone. See COVID-19 and Animals if you have questions about pets. If you are diagnosed with COVID-19, someone from the health department may call you. Answer the call  to slow the spread. Monitor your symptoms Symptoms of COVID-19 include fever, cough, or other symptoms. Follow care instructions from your healthcare provider and local health department. Your local health authorities may give instructions on checking your symptoms and reporting information. When to seek emergency medical attention Look for emergency warning signs* for COVID-19. If someone is showing any of these signs, seek emergency medical care immediately: Trouble breathing Persistent pain or pressure in the chest New confusion Inability to wake or stay awake Pale, gray, or blue-colored skin, lips, or nail beds, depending on skin tone *This list is not all possible symptoms. Please call your medical provider for any other symptoms that are severe or concerning to you. Call 911 or call ahead to your local emergency facility: Notify the operator that you are seeking care for someone who has or may have COVID-19. Call ahead before visiting your doctor Call ahead. Many medical visits for routine care are being postponed or done by phone or telemedicine. If you have a medical appointment that cannot be postponed, call your doctor's office, and tell them you have or may have COVID-19. This will help the office protect themselves and other patients. If you are sick, wear a well-fitting mask You should wear a mask if you must be around other people or animals, including pets (even at home). Wear a mask with the best fit, protection, and comfort for you. You don't need to wear the mask if you are alone. If you can't put on a mask (because of trouble breathing, for example), cover your coughs and sneezes in some other way. Try to stay at least 6 feet away from other people. This will help protect the people around you. Masks should not be placed on young children under age 73 years, anyone who has trouble breathing, or anyone who is not able to remove the mask without help. Cover your coughs and  sneezes Cover your mouth and nose with a tissue when you cough or sneeze. Throw away used tissues in a lined trash can. Immediately wash your hands with soap and water for at least 20 seconds. If soap and water are not available, clean your hands with an alcohol-based hand sanitizer that contains at least 60% alcohol. Clean your hands often Wash your hands often with soap and water for at least 20 seconds. This is especially important after blowing your nose,  coughing, or sneezing; going to the bathroom; and before eating or preparing food. Use hand sanitizer if soap and water are not available. Use an alcohol-based hand sanitizer with at least 60% alcohol, covering all surfaces of your hands and rubbing them together until they feel dry. Soap and water are the best option, especially if hands are visibly dirty. Avoid touching your eyes, nose, and mouth with unwashed hands. Handwashing Tips Avoid sharing personal household items Do not share dishes, drinking glasses, cups, eating utensils, towels, or bedding with other people in your home. Wash these items thoroughly after using them with soap and water or put in the dishwasher. Clean surfaces in your home regularly Clean and disinfect high-touch surfaces (for example, doorknobs, tables, handles, light switches, and countertops) in your sick room and bathroom. In shared spaces, you should clean and disinfect surfaces and items after each use by the person who is ill. If you are sick and cannot clean, a caregiver or other person should only clean and disinfect the area around you (such as your bedroom and bathroom) on an as needed basis. Your caregiver/other person should wait as long as possible (at least several hours) and wear a mask before entering, cleaning, and disinfecting shared spaces that you use. Clean and disinfect areas that may have blood, stool, or body fluids on them. Use household cleaners and disinfectants. Clean visible dirty  surfaces with household cleaners containing soap or detergent. Then, use a household disinfectant. Use a product from [employer] List N: Disinfectants for Coronavirus (COVID-19). Be sure to follow the instructions on the label to ensure safe and effective use of the product. Many products recommend keeping the surface wet with a disinfectant for a certain period of time (look at contact time on the product label). You may also need to wear personal protective equipment, such as gloves, depending on the directions on the product label. Immediately after disinfecting, wash your hands with soap and water for 20 seconds. For completed guidance on cleaning and disinfecting your home, visit Complete Disinfection Guidance. Take steps to improve ventilation at home Improve ventilation (air flow) at home to help prevent from spreading COVID-19 to other people in your household. Clear out COVID-19 virus particles in the air by opening windows, using air filters, and turning on fans in your home. Use this interactive tool to learn how to improve air flow in your home. When you can be around others after being sick with COVID-19 Deciding when you can be around others is different for different situations. Find out when you can safely end home isolation. For any additional questions about your care, contact your healthcare provider or state or local health department. 01/23/2021 Content source: [facility] ([facility]), Division of Viral Diseases This information is not intended to replace advice given to you by your health care provider. Make sure you discuss any questions you have with your health care provider. Document Revised: 03/08/2021 Document Reviewed: 03/08/2021 Elsevier Patient Education  2022 [employer].     If you have been instructed to have an in-person evaluation today at a local Urgent Care facility, please use the link below. It will take you to a  list of all of our available [facility], including address, phone number and hours of operation. Please do not delay care.  [facility]  If you or a family member do not have a primary care provider, use the link below to schedule a visit  and establish care. When you choose a [facility] primary care physician or advanced practice provider, you gain a long-term partner in health. Find a Primary Care Provider  Learn more about [facility]'s in-office and virtual care options: [facility] - Get Care Now

## 2024-06-15 NOTE — Progress Notes (Signed)
 Virtual Visit Consent   Alex Dunn, you are scheduled for a virtual visit with a [facility] provider today. Just as with appointments in the office, your consent must be obtained to participate. Your consent will be active for this visit and any virtual visit you may have with one of our providers in the next 365 days. If you have a MyChart account, a copy of this consent can be sent to you electronically.  As this is a virtual visit, video technology does not allow for your provider to perform a traditional examination. This may limit your provider's ability to fully assess your condition. If your provider identifies any concerns that need to be evaluated in person or the need to arrange testing (such as labs, EKG, etc.), we will make arrangements to do so. Although advances in technology are sophisticated, we cannot ensure that it will always work on either your end or our end. If the connection with a video visit is poor, the visit may have to be switched to a telephone visit. With either a video or telephone visit, we are not always able to ensure that we have a secure connection.  By engaging in this virtual visit, you consent to the provision of healthcare and authorize for your insurance to be billed (if applicable) for the services provided during this visit. Depending on your insurance coverage, you may receive a charge related to this service.  I need to obtain your verbal consent now. Are you willing to proceed with your visit today? Alex Dunn has provided verbal consent on 06/15/2024 for a virtual visit (video or telephone). Alex Dunn, [place]  Date: 06/15/2024 11:00 AM   Virtual Visit via Video Note   I, Alex Dunn, connected with  Alex Dunn  ([id number], [birth year]) on 06/15/24 at 10:00 AM EDT by a video-enabled telemedicine application and verified that I am speaking with the correct person using two identifiers.  Location: Patient: Virtual Visit  Location Patient: Home Provider: Virtual Visit Location Provider: Home Office   I discussed the limitations of evaluation and management by telemedicine and the availability of in person appointments. The patient expressed understanding and agreed to proceed.    History of Present Illness: Alex Dunn is a 88 y.o. who identifies as a male who was assigned male at birth, and is being seen today for URI symptoms starting Sunday AM with mild malaise and a tickle in throat. Notes Sunday PM to Monday AM noting cough and continued fatigue/malaise. Monday PM -- increased cough and development of fever (101 Tmax). No known sick contacts. Notes cough is sometimes productive of small amount of clear phlegm. Thinks cough is mainly secondary to nasal drainage. Denies chest pain.  Some mild SOB with exertion. Mild sinus headache and sinus pressure. Denies ear pain or facial pain.   Has not tested for COVID.   OTC -- Nothing. Has Flonase  but has not been using.   HPI: HPI  Problems:  Patient Active Problem List   Diagnosis Date Noted   TIA (transient ischemic attack) 03/11/2023   Vertigo 03/09/2023   Dyslipidemia 03/09/2023   Orthostatic hypotension 03/09/2023   DNR (do not resuscitate) 03/09/2023   Cerebellar ataxia in diseases classified elsewhere (HCC) 12/07/2021   CIDP (chronic inflammatory demyelinating polyneuropathy) (HCC) 12/07/2021   History of CVA (cerebrovascular accident) 09/14/2021   Neuropathic pain 06/05/2021   PVC's (premature ventricular contractions) 03/01/2021   Polycystic kidney disease    MGUS (monoclonal gammopathy of  unknown significance)    Diverticulosis    Stage 3b chronic kidney disease (HCC) 06/21/2020   B12 deficiency 06/13/2020   Ataxia 02/14/2020   Bilateral leg pain 02/14/2020   Weakness of both lower extremities 01/10/2020   Balance problem 01/10/2020   Nonsustained ventricular tachycardia (HCC) 11/03/2019   Lumbar radiculopathy 05/06/2019   Lumbosacral  spondylosis without myelopathy 03/10/2019   Low back pain 03/09/2019   Monoclonal gammopathy of unknown significance (MGUS) 08/24/2018   Primary osteoarthritis of both knees 06/30/2018   Degeneration of lumbar intervertebral disc 06/30/2018   Essential hypertension 04/15/2018   Diverticular disease 04/15/2018   Abdominal aortic atherosclerosis (HCC) 04/15/2018   Elevated diaphragm 04/15/2018   Multiple renal cysts 04/06/2018   Night sweats 03/13/2018   Weakness 03/13/2018   Chronic kidney disease 10/18/2016   ANEMIA-IRON DEFICIENCY 06/23/2007   Gastro-esophageal reflux disease without esophagitis 06/23/2007   BPH (benign prostatic hyperplasia) 06/23/2007   GERD 06/23/2007    Allergies:  Allergies  Allergen Reactions   Amitriptyline Other (See Comments)    SLEPT FOR 18-19 HOURS STRAIGHT   Codeine Phosphate Other (See Comments)    Seen odd things crawling up wall   Pregabalin Other (See Comments)    Drowsy, nightmares, stumbling   Tamsulosin  Other (See Comments)    Faint feeling    Tape Other (See Comments)    Skin irritation   Lexapro  [Escitalopram  Oxalate] Other (See Comments)    shaking   Medications:  Current Outpatient Medications:    benzonatate  (TESSALON ) 100 MG capsule, Take 1 capsule (100 mg total) by mouth 3 (three) times daily as needed for cough., Disp: 30 capsule, Rfl: 0   nirmatrelvir /ritonavir , renal dosing, (PAXLOVID ) 10 x 150 MG & 10 x 100MG  TABS, Take 2 tablets by mouth 2 (two) times daily for 5 days. (Take nirmatrelvir  150 mg one tablet twice daily for 5 days and ritonavir  100 mg one tablet twice daily for 5 days) Patient GFR is 45, Disp: 20 tablet, Rfl: 0   acetaminophen  (TYLENOL ) 325 MG tablet, Take 650 mg by mouth 4 (four) times daily as needed for mild pain or moderate pain (pain)., Disp: , Rfl:    aspirin  EC 81 MG tablet, Take 81 mg by mouth daily. Swallow whole., Disp: , Rfl:    atorvastatin  (LIPITOR) 10 MG tablet, TAKE 1 TABLET(10 MG) BY MOUTH DAILY,  Disp: 90 tablet, Rfl: 3   Cholecalciferol (VITAMIN D ) 50 MCG (2000 UT) CAPS, Take 2,000 Units by mouth daily at 6 (six) AM., Disp: , Rfl:    cyanocobalamin  1000 MCG tablet, Take 1,000 mcg by mouth daily., Disp: , Rfl:    fluticasone  (FLONASE ) 50 MCG/ACT nasal spray, Place 2 sprays into both nostrils daily. (Patient taking differently: Place 2 sprays into both nostrils as needed for allergies or rhinitis.), Disp: 16 g, Rfl: 6   Multiple Vitamins-Minerals (PRESERVISION AREDS) CAPS, Take 1 capsule by mouth daily., Disp: , Rfl:    pantoprazole  (PROTONIX ) 40 MG tablet, Take 1 tablet (40 mg total) by mouth daily as needed., Disp: 90 tablet, Rfl: 3   traMADol  (ULTRAM ) 50 MG tablet, Take 1 tablet (50 mg total) by mouth every 12 (twelve) hours as needed., Disp: 90 tablet, Rfl: 1   triamcinolone  cream (KENALOG ) 0.1 %, Apply 1 Application topically 2 (two) times daily., Disp: 30 g, Rfl: 0  Observations/Objective: Patient is well-developed, well-nourished in no acute distress.  Resting comfortably at home.  Head is normocephalic, atraumatic.  No labored breathing. Speech is clear and coherent  with logical content.  Patient is alert and oriented at baseline.   Assessment and Plan: 1. COVID-19 (Primary) - nirmatrelvir /ritonavir , renal dosing, (PAXLOVID ) 10 x 150 MG & 10 x 100MG  TABS; Take 2 tablets by mouth 2 (two) times daily for 5 days. (Take nirmatrelvir  150 mg one tablet twice daily for 5 days and ritonavir  100 mg one tablet twice daily for 5 days) Patient GFR is 45  Dispense: 20 tablet; Refill: 0  Home COVID test taken and noted to be positive. Patient with multiple risk factors for complicated course of illness. Discussed risks/benefits of antiviral medications including most common potential ADRs. Patient voiced understanding and would like to proceed with antiviral medication. They are candidate for Paxlovid  -- renal dose (GFR 45). Rx sent to pharmacy. Supportive measures, OTC medications and vitamin  regimen reviewed. Tessalon  per orders. Quarantine reviewed in detail. Strict ER precautions discussed with patient.    Follow Up Instructions: I discussed the assessment and treatment plan with the patient. The patient was provided an opportunity to ask questions and all were answered. The patient agreed with the plan and demonstrated an understanding of the instructions.  A copy of instructions were sent to the patient via MyChart unless otherwise noted below.   The patient was advised to call back or seek an in-person evaluation if the symptoms worsen or if the condition fails to improve as anticipated.    Alex Velma Lunger, PA-C

## 2024-06-15 NOTE — Telephone Encounter (Signed)
 FYI Only or Action Required?: FYI only for provider.  Patient was last seen in primary care on 05/27/2024 by Frann Mabel Mt, DO.  Called Nurse Triage reporting Fever and Cough.  Symptoms began yesterday.  Interventions attempted: OTC medications: Tylenol  and cough drops.  Symptoms are: stable.  Triage Disposition: See HCP Within 4 Hours (Or PCP Triage)  Patient/caregiver understands and will follow disposition?: Yes                             Copied from CRM #[id number]. Topic: Clinical - Red Word Triage >> Jun 15, 2024  9:09 AM Gennette ORN wrote: Red Word that prompted transfer to Nurse Triage: Patient's wife Naomie is calling because the patient has a congested cough , a fever that was  98.5 at 7:35 am , and also the chills. It was 101 it was last night. Reason for Disposition  [1] Fever > 101 F (38.3 C) AND [2] age > 60 years  Answer Assessment - Initial Assessment Questions 1. TEMPERATURE: What is the most recent temperature?  How was it measured?      101 at 8pm last night, 98.5 this morning 2. ONSET: When did the fever start?      Yesterday  3. CHILLS: Do you have chills? If yes: How bad are they?  (e.g., none, mild, moderate, severe)     Yes, mild 4. OTHER SYMPTOMS: Do you have any other symptoms besides the fever?  (e.g., abdomen pain, cough, diarrhea, earache, headache, sore throat, urination pain)     Clear phlegm/nasal drainage, productive cough, mild headache, SOB upon exertion- states this has started within past few weeks- wife states mild SOB is baseline for patient, denies chest pain 5. CAUSE: If there are no symptoms, ask: What do you think is causing the fever?      Unsure 6. CONTACTS: Does anyone else in the family have an infection?     Unsure 7. TREATMENT: What have you done so far to treat this fever? (e.g., OTC fever medicines)     Tylenol   8. IMMUNOCOMPROMISE: Do you have any of the following: diabetes,  HIV positive, splenectomy, cancer chemotherapy, chronic steroid treatment, transplant patient, etc.?     Yes    Advised patient to be evaluated within 4 hours. Patient is not feeling up to an in-person visit. Patient requested virtual appointment. This RN confirmed patient has access to a phone with a camera. No virtual availability in office today, symptoms are appropriate for virtual UC. Scheduled virtual UC appointment for this morning.  Protocols used: [facility]

## 2024-06-17 ENCOUNTER — Telehealth: Payer: Self-pay | Admitting: Pulmonary Disease

## 2024-06-17 NOTE — Telephone Encounter (Signed)
 Forwarding questions to provider and monitor specialists.

## 2024-06-17 NOTE — Telephone Encounter (Signed)
 Pt c/o medication issue:  1. Name of Medication: nirmatrelvir /ritonavir , renal dosing, (PAXLOVID ) 10 x 150 MG & 10 x 100MG  TABS   2. How are you currently taking this medication (dosage and times per day)? As written   3. Are you having a reaction (difficulty breathing--STAT)? No   4. What is your medication issue? Pt spouse called in stating pt has covid and was prescribed Paxlovid . She asked if its still okay for him to wear heart monitor, they just got it in the mail. She also states pt has a spinal stimulator and asked if that would interfere with heart monitor.

## 2024-06-17 NOTE — Telephone Encounter (Addendum)
 It would be ok for the patient to wear his ZIO patch monitor, however, he would not be able to shower for 24 hours after applying it.  If he is still running a temperature, he may want to hold off a few days before applying it. A spinal stimulator may cause 60 cycle interference on his monitor recording.  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  Even with interference, his monitor would still be able to pick up PVC,s or NSVT.  If the data on his monitor is not interpretable , we could have the charges cancelled.

## 2024-06-28 ENCOUNTER — Ambulatory Visit

## 2024-06-28 VITALS — BP 155/82 | HR 77 | Resp 15 | Ht 71.5 in | Wt 179.0 lb

## 2024-06-28 DIAGNOSIS — M791 Myalgia, unspecified site: Secondary | ICD-10-CM | POA: Diagnosis not present

## 2024-06-28 DIAGNOSIS — R768 Other specified abnormal immunological findings in serum: Secondary | ICD-10-CM | POA: Diagnosis not present

## 2024-06-28 DIAGNOSIS — D472 Monoclonal gammopathy: Secondary | ICD-10-CM | POA: Diagnosis not present

## 2024-06-28 DIAGNOSIS — R634 Abnormal weight loss: Secondary | ICD-10-CM

## 2024-06-28 DIAGNOSIS — R5383 Other fatigue: Secondary | ICD-10-CM | POA: Diagnosis not present

## 2024-06-28 NOTE — Progress Notes (Unsigned)
 Office Visit Note  Patient: Alex Dunn             Date of Birth: Mar 01, 1936           MRN: [id number]             PCP: Frann Mabel Mt, DO Referring: Frann Mabel Mt* Visit Date: 06/28/2024  Subjective:  No chief complaint on file.   History of Present Illness: Alex Dunn is a 88 y.o. male who presents due to positive ANA.  He reports his symptoms started 6 years ago, woke up feeling like he had the flu, with muscle aches. No fevers. He denies rashes and admits joint pain everywhere. He admits to some improvement with tylenol . He denies any joint swelling. He reports that these episodes would occur every 6-8 weeks and would last for ~ 8 hours. Every episode occurs at 3am, and he never has an episode during the day.   Over the last 2.5 months these symptoms have been worse. He admits to two tick bites right before it started to get worse. He states it usually occurs for one night, but this time it has occurred every night since things started to get worse.  He admits to sister who has similar symptoms (she has fibromyalgia). He denies any family hx of autoimmune disease that he is aware of.   He admits to recent 20 pound weight loss, which he attributes to being so tired from having these episodes nightly. He denies oral ulcers, rash, photosensitivity, raynaud's symptoms. He denies severe headache or jaw claudication. He admits to new bilateral blurred vision that occurs when he is in bright light, no sudden loss of vision.  Activities of Daily Living:  Patient reports morning stiffness for 2 hours.   Patient Reports nocturnal pain.  Difficulty dressing/grooming: Denies Difficulty climbing Dunn: Reports Difficulty getting out of chair: Reports Difficulty using hands for taps, buttons, cutlery, and/or writing: Reports  Review of Systems  Constitutional:  Positive for fatigue.  HENT:  Positive for mouth dryness. Negative for mouth sores.   Eyes:   Negative for dryness.  Respiratory:  Positive for shortness of breath.        On exertion   Cardiovascular:  Negative for chest pain and palpitations.  Gastrointestinal:  Positive for constipation. Negative for blood in stool and diarrhea.  Endocrine: Negative for increased urination.  Genitourinary:  Negative for involuntary urination.  Musculoskeletal:  Positive for joint pain, gait problem, joint pain, joint swelling, myalgias, muscle weakness, morning stiffness and myalgias. Negative for muscle tenderness.  Skin:  Positive for color change and rash. Negative for sensitivity to sunlight.  Allergic/Immunologic: Positive for susceptible to infections.  Neurological:  Positive for dizziness and numbness. Negative for headaches.  Hematological:  Negative for swollen glands.  Psychiatric/Behavioral:  Positive for sleep disturbance. Negative for depressed mood. The patient is not nervous/anxious.      Rheum History: #Positive ANA    PMFS History:  Patient Active Problem List   Diagnosis Date Noted   TIA (transient ischemic attack) 03/11/2023   Vertigo 03/09/2023   Dyslipidemia 03/09/2023   Orthostatic hypotension 03/09/2023   DNR (do not resuscitate) 03/09/2023   Cerebellar ataxia in diseases classified elsewhere (HCC) 12/07/2021   CIDP (chronic inflammatory demyelinating polyneuropathy) (HCC) 12/07/2021   History of CVA (cerebrovascular accident) 09/14/2021   Neuropathic pain 06/05/2021   PVC's (premature ventricular contractions) 03/01/2021   Polycystic kidney disease    MGUS (monoclonal gammopathy of unknown significance)  Diverticulosis    Stage 3b chronic kidney disease (HCC) 06/21/2020   B12 deficiency 06/13/2020   Ataxia 02/14/2020   Bilateral leg pain 02/14/2020   Weakness of both lower extremities 01/10/2020   Balance problem 01/10/2020   Nonsustained ventricular tachycardia (HCC) 11/03/2019   Lumbar radiculopathy 05/06/2019   Lumbosacral spondylosis without  myelopathy 03/10/2019   Low back pain 03/09/2019   Monoclonal gammopathy of unknown significance (MGUS) 08/24/2018   Primary osteoarthritis of both knees 06/30/2018   Degeneration of lumbar intervertebral disc 06/30/2018   Essential hypertension 04/15/2018   Diverticular disease 04/15/2018   Abdominal aortic atherosclerosis (HCC) 04/15/2018   Elevated diaphragm 04/15/2018   Multiple renal cysts 04/06/2018   Night sweats 03/13/2018   Weakness 03/13/2018   Chronic kidney disease 10/18/2016   ANEMIA-IRON DEFICIENCY 06/23/2007   Gastro-esophageal reflux disease without esophagitis 06/23/2007   BPH (benign prostatic hyperplasia) 06/23/2007   GERD 06/23/2007    Past Medical History:  Diagnosis Date   Abdominal aortic atherosclerosis (HCC) 04/15/2018   ANEMIA-IRON DEFICIENCY 06/23/2007   B12 deficiency 06/13/2020   BPH (benign prostatic hyperplasia) 06/23/2007   Qualifier: Diagnosis of  By: Nunzio, RN, Dagoberto Caldron   Formatting of this note might be different from the original. Formatting of this note might be different from the original. Qualifier: Diagnosis of  By: Nunzio, RN, Dagoberto Caldron   Degeneration of lumbar intervertebral disc 06/30/2018   Diverticulosis    Elevated diaphragm 04/15/2018   Essential hypertension 04/15/2018   Gastro-esophageal reflux disease without esophagitis 06/23/2007   Qualifier: Diagnosis of  By: Nunzio, RN, Dagoberto Caldron   Formatting of this note might be different from the original. Formatting of this note might be different from the original. Qualifier: Diagnosis of  By: Nunzio, RN, Dagoberto Caldron   Lumbar radiculopathy 05/06/2019   Lumbosacral spondylosis without myelopathy 03/10/2019   Monoclonal gammopathy of unknown significance (MGUS) 08/24/2018   Multiple renal cysts 04/06/2018   Nonsustained ventricular tachycardia (HCC) 11/03/2019   Primary osteoarthritis of both knees 06/30/2018   Bilateral moderate with moderate chondromalacia patella Formatting of this note might be  different from the original. Formatting of this note might be different from the original. Bilateral moderate with moderate chondromalacia patella   Stage 3b chronic kidney disease (HCC) 06/21/2020    Family History  Problem Relation Age of Onset   Heart attack Father    Early death Father    Fibromyalgia Sister    Heart attack Brother        leaky valve   Endometriosis Daughter    Migraines Daughter    Neuromuscular disorder Neg Hx    Past Surgical History:  Procedure Laterality Date   HAND SURGERY Right    LUNG SURGERY     for collapsed lung   SPINAL CORD STIMULATOR IMPLANT  01/24/2023   Social History   Social History Narrative   Lives at home with his wife   Retired   Right handed   Caffeine: about 2 cups daily   Immunization History  Administered Date(s) Administered   Fluad Quad(high Dose 65+) 07/28/2019, 09/07/2020, 08/24/2021, 08/13/2022   Fluad Trivalent(High Dose 65+) 07/28/2023   INFLUENZA, HIGH DOSE SEASONAL PF 09/02/2013, 08/21/2016, 08/27/2017, 09/10/2018   Influenza Split 08/30/2011, 09/01/2012   Influenza Whole 09/03/2007, 08/11/2008, 09/13/2009, 08/24/2010   Influenza,inj,Quad PF,6+ Mos 09/05/2014, 08/01/2015   Influenza-Unspecified 08/30/2011, 09/01/2012, 09/02/2013, 09/05/2014, 08/01/2015, 08/21/2016, 08/27/2017, 08/20/2018, 09/10/2018   PFIZER Comirnaty(Gray Top)Covid-19 Tri-Sucrose Vaccine 06/18/2021   PFIZER(Purple Top)SARS-COV-2 Vaccination 11/16/2019, 12/04/2019, 07/11/2020  PPD Test 03/09/2018   Pneumococcal Conjugate-13 09/05/2014   Pneumococcal Polysaccharide-23 11/09/2009   Tdap 08/17/2003   Tetanus 09/02/2013   Zoster Recombinant(Shingrix) 01/20/2015   Zoster, Live 07/31/2014     Objective: Vital Signs: BP (!) 155/82 (BP Location: Left Arm, Patient Position: Sitting, Cuff Size: Normal)   Pulse 77   Resp 15   Ht 5' 11.5 (1.816 m)   Wt 179 lb (81.2 kg)   BMI 24.62 kg/m    Physical Exam Vitals and nursing note reviewed.   Constitutional:      Appearance: He is well-developed.  HENT:     Head: Normocephalic and atraumatic.     Nose: Nose normal.  Eyes:     Conjunctiva/sclera: Conjunctivae normal.     Pupils: Pupils are equal, round, and reactive to light.  Pulmonary:     Effort: Pulmonary effort is normal.  Abdominal:     General: Bowel sounds are normal.     Palpations: Abdomen is soft.  Skin:    General: Skin is warm and dry.  Neurological:     Mental Status: He is alert and oriented to person, place, and time.  Psychiatric:        Behavior: Behavior normal.      Musculoskeletal Exam:   CDAI Exam: CDAI Score: -- Patient Global: --; Provider Global: -- Swollen: 0 ; Tender: 0  Joint Exam 06/28/2024   All documented joints were normal     Investigation: No additional findings.  Imaging: No results found.  Recent Labs: Lab Results  Component Value Date   WBC 6.1 05/23/2024   HGB 13.5 05/23/2024   PLT 157 05/23/2024   NA 140 05/23/2024   K 4.5 05/23/2024   CL 108 05/23/2024   CO2 23 05/23/2024   GLUCOSE 96 05/23/2024   BUN 23 05/23/2024   CREATININE 1.58 (H) 05/23/2024   BILITOT 0.7 05/23/2024   ALKPHOS 98 05/23/2024   AST 18 05/23/2024   ALT 20 05/23/2024   PROT 6.6 05/23/2024   ALBUMIN 3.6 05/23/2024   CALCIUM  9.0 05/23/2024   GFRAA 48 (L) 04/27/2020   QFTBGOLDPLUS NEGATIVE 06/15/2018    Speciality Comments: No specialty comments available.  Procedures:  No procedures performed Allergies: Amitriptyline, Codeine phosphate, Pregabalin, Tamsulosin , Tape, and Lexapro  [escitalopram  oxalate]   Assessment / Plan:     Visit Diagnoses: Positive ANA (antinuclear antibody)  Mildly positive dsDNA Patient with mild positive ANA and mildly positive dsDNA, slightly increased from prior study 6 years ago. However, his overall manifestation is not really suggestive of an underlying SLE. Patient does not have any features of SLE (no objective malar rash, oral ulcers,  inflammatory arthritis, Raynaud's, alopecia, recurrent cytopenias), Sjogren's disease (no sicca symptoms), scleroderma (no sclerodactyly, no Raynaud's). However, given mild increase in dsDNA, will obtain C3, C4, UA and UPC for further evaluation.   As discussed with patient, a positive ANA need not represent presence of clinically active systemic autoimmune disease. Can be positive in the normal population, autoimmune thyroid  disease (Graves' disease, Hashimoto's thyroiditis etc.), or infection. ANA can be positive in the healthy population at the following rates: ANA 1:40: 20%-30%, ANA 1:80: 10%-15%, ANA 1:160: 5%, ANA 1:320: 3% positive, healthy relative of an SLE patient: 5%-25% positive (usually low titers), and elderly (age >70 years): up to 70% positive at ANA titer 1:40.    Elevated ESR An elevated erythrocyte sedimentation rate (ESR) is a nonspecific marker of inflammation. It can be elevated in a variety of conditions, including infections, inflammatory  diseases such as rheumatoid arthritis and vasculitis, and malignancies. Non-rheumatic causes of elevated ESR include chronic kidney disease, anemia, and pregnancy. ESR can also be influenced by age and sex. Given patient's age, the ULN of ESR would be 43; which patient's current level is below, suggesting it is WNL at this time.   Although extremely low suspicion, could possibly be atypical presentation of PMR. Will check CRP to see if there is any evidence of inflammatory disease in that regard. I did discuss possible trial of prednisone  taper to see if that provided any relief given this possibility. However, extensively discussed risks of prednisone  use in elderly patients including weight gain, decreased bone density/fractures, risk for infection, avascular necrosis, hypertension, hyperglycemia, dyspepsia/gastric ulcers, fluid retention, weakness, bruising, acne, cataracts, insomnia, mood problems. Patient verbalized  understanding.  Fatigue Myalgia Unexplained weight loss Patient's primary symptom appears to be myalgia. He reports significant additional symptoms secondary to his fatigue due to nocturnal myalgia interfering with his sleep. Given hx of MGUS and unexplainable weight loss, will recheck immunoglobulins, SPEP, kappa/lambda light chains. Will also check CK to rule out any evidence of inflammatory myopathy, however patient has no evidence of weakness on exam and aldolase, a more specific marker, was negative. May want to consider trial of statin discontinuation to see if there is any improvement in symptoms, given that myalgia can be secondary to statin use even in patient's who have been on treatment for extended period of time.    Orders: Orders Placed This Encounter  Procedures   Protein / creatinine ratio, urine   Urinalysis   C3 and C4   Protein Electrophoresis, (serum)   IgG, IgA, IgM   Kappa/lambda light chains   C-reactive protein   CK   Protein / creatinine ratio, urine   Urinalysis   C-reactive protein   No orders of the defined types were placed in this encounter.   Face-to-face time spent with patient was 60 minutes. Greater than 50% of time was spent in counseling and coordination of care.  Follow-Up Instructions: Return in about 3 months (around 09/28/2024).   Asberry Claw, DO

## 2024-06-29 ENCOUNTER — Other Ambulatory Visit: Payer: Self-pay | Admitting: Family Medicine

## 2024-06-29 ENCOUNTER — Telehealth: Payer: Self-pay | Admitting: *Deleted

## 2024-06-29 NOTE — Telephone Encounter (Signed)
 Patient's wife contacted the office and states she was under the impression that a prescription for prednisone  was going to be sent to the pharmacy. Consulted with Dr. Luba and she advised she was waiting on lab results to come back before she decided whether she wants to send it to the pharmacy. Per Dr. Luba, schedule sooner office appointment to discuss lab results. Scheduled for 07/07/2024 at 11:00 am.

## 2024-06-29 NOTE — Telephone Encounter (Unsigned)
 Copied from CRM #[id number]. Topic: Clinical - Medication Refill >> Jun 29, 2024 10:43 AM Rea C wrote: Medication: pantoprazole  (PROTONIX ) 40 MG tablet  Has the patient contacted their pharmacy? Yes and they said that clinic needs to call in a new prescription because it's been too long since last fill.   This is the patient's preferred pharmacy:  [id number] - [place], [place] - [address] [facility] [address] [place] [address] Phone: [phone] Fax: [phone]   Is this the correct pharmacy for this prescription? Yes If no, delete pharmacy and type the correct one.   Has the prescription been filled recently? No  Is the patient out of the medication? Yes  Has the patient been seen for an appointment in the last year OR does the patient have an upcoming appointment? Yes  Can we respond through MyChart? Yes  Agent: Please be advised that Rx refills may take up to 3 business days. We ask that you follow-up with your pharmacy.

## 2024-06-30 ENCOUNTER — Encounter: Payer: Self-pay | Admitting: Family Medicine

## 2024-06-30 ENCOUNTER — Other Ambulatory Visit: Payer: Self-pay | Admitting: Family Medicine

## 2024-06-30 ENCOUNTER — Telehealth: Payer: Self-pay

## 2024-06-30 LAB — URINALYSIS
Bilirubin Urine: NEGATIVE
Glucose, UA: NEGATIVE
Hgb urine dipstick: NEGATIVE
Ketones, ur: NEGATIVE
Leukocytes,Ua: NEGATIVE
Nitrite: NEGATIVE
Specific Gravity, Urine: 1.018 (ref 1.001–1.035)
pH: 6 (ref 5.0–8.0)

## 2024-06-30 LAB — KAPPA/LAMBDA LIGHT CHAINS
Kappa free light chain: 52.3 mg/L — ABNORMAL HIGH (ref 3.3–19.4)
Kappa:Lambda Ratio: 2.08 — ABNORMAL HIGH (ref 0.26–1.65)
Lambda Free Lght Chn: 25.2 mg/L (ref 5.7–26.3)

## 2024-06-30 LAB — PROTEIN / CREATININE RATIO, URINE
Creatinine, Urine: 169 mg/dL (ref 20–320)
Protein/Creat Ratio: 118 mg/g{creat} (ref 25–148)
Protein/Creatinine Ratio: 0.118 mg/mg{creat} (ref 0.025–0.148)
Total Protein, Urine: 20 mg/dL (ref 5–25)

## 2024-06-30 LAB — PROTEIN ELECTROPHORESIS, SERUM
Albumin ELP: 3.6 g/dL — ABNORMAL LOW (ref 3.8–4.8)
Alpha 1: 0.4 g/dL — ABNORMAL HIGH (ref 0.2–0.3)
Alpha 2: 0.9 g/dL (ref 0.5–0.9)
Beta 2: 0.5 g/dL (ref 0.2–0.5)
Beta Globulin: 0.4 g/dL (ref 0.4–0.6)
Gamma Globulin: 1 g/dL (ref 0.8–1.7)
Total Protein: 6.7 g/dL (ref 6.1–8.1)

## 2024-06-30 LAB — C-REACTIVE PROTEIN: CRP: <3 mg/L (ref <8.0)

## 2024-06-30 LAB — IGG, IGA, IGM
IgG (Immunoglobin G), Serum: 949 mg/dL (ref 600–1540)
IgM, Serum: 72 mg/dL (ref 50–300)
Immunoglobulin A: 492 mg/dL — ABNORMAL HIGH (ref 70–320)

## 2024-06-30 LAB — C3 AND C4
C3 Complement: 150 mg/dL
C4 Complement: 28 mg/dL

## 2024-06-30 MED ORDER — PANTOPRAZOLE SODIUM 40 MG PO TBEC: 40.0000 mg | DELAYED_RELEASE_TABLET | Freq: Every day | ORAL | 0 refills | Status: DC | PRN

## 2024-06-30 MED ORDER — INDAPAMIDE 1.25 MG PO TABS: 1.2500 mg | ORAL_TABLET | Freq: Every day | ORAL | 1 refills | Status: DC

## 2024-06-30 NOTE — Telephone Encounter (Signed)
 Patients wife called the office stating she had a missed call form the office but was not sure what the call was in regards to. Advised I did not see any calls out to the patient, wife says it may have just been a reminder for the appointment next week.

## 2024-07-01 ENCOUNTER — Ambulatory Visit: Payer: Self-pay

## 2024-07-01 DIAGNOSIS — H353132 Nonexudative age-related macular degeneration, bilateral, intermediate dry stage: Secondary | ICD-10-CM | POA: Diagnosis not present

## 2024-07-07 ENCOUNTER — Ambulatory Visit

## 2024-07-07 VITALS — BP 149/78 | HR 79 | Resp 13 | Ht 71.0 in | Wt 179.2 lb

## 2024-07-07 DIAGNOSIS — M791 Myalgia, unspecified site: Secondary | ICD-10-CM

## 2024-07-07 DIAGNOSIS — R5383 Other fatigue: Secondary | ICD-10-CM | POA: Diagnosis not present

## 2024-07-07 DIAGNOSIS — R768 Other specified abnormal immunological findings in serum: Secondary | ICD-10-CM | POA: Diagnosis not present

## 2024-07-07 DIAGNOSIS — I7300 Raynaud's syndrome without gangrene: Secondary | ICD-10-CM

## 2024-07-07 DIAGNOSIS — D472 Monoclonal gammopathy: Secondary | ICD-10-CM

## 2024-07-07 NOTE — Progress Notes (Unsigned)
 Office Visit Note  Patient: Alex Dunn             Date of Birth: April 27, 1936           MRN: [id number]             PCP: Frann Mabel Mt, DO Referring: Frann Mabel Mt* Visit Date: 07/07/2024 Occupation: @GUAROCC @  Subjective:  No chief complaint on file.   History of Present Illness: Alex Dunn is a 88 y.o. male ***     Activities of Daily Living:  Patient reports morning stiffness for 0 minutes.   Patient Reports nocturnal pain.  Difficulty dressing/grooming: Denies Difficulty climbing stairs: Reports Difficulty getting out of chair: Reports Difficulty using hands for taps, buttons, cutlery, and/or writing: Reports  Review of Systems  Constitutional:  Positive for fatigue.  HENT:  Positive for mouth dryness. Negative for mouth sores.   Eyes:  Negative for dryness.  Respiratory:  Positive for shortness of breath.   Cardiovascular:  Negative for chest pain and palpitations.  Gastrointestinal:  Positive for constipation. Negative for blood in stool and diarrhea.  Endocrine: Negative for increased urination.  Genitourinary:  Negative for involuntary urination.  Musculoskeletal:  Positive for joint pain, gait problem, joint pain, joint swelling, myalgias, muscle weakness and myalgias. Negative for morning stiffness and muscle tenderness.  Skin:  Negative for color change, rash, hair loss and sensitivity to sunlight.  Allergic/Immunologic: Negative for susceptible to infections.  Neurological:  Positive for dizziness. Negative for headaches.  Hematological:  Negative for swollen glands.  Psychiatric/Behavioral:  Positive for sleep disturbance. Negative for depressed mood. The patient is nervous/anxious.     PMFS History:  Patient Active Problem List   Diagnosis Date Noted   TIA (transient ischemic attack) 03/11/2023   Vertigo 03/09/2023   Dyslipidemia 03/09/2023   Orthostatic hypotension 03/09/2023   DNR (do not resuscitate) 03/09/2023    Cerebellar ataxia in diseases classified elsewhere (HCC) 12/07/2021   CIDP (chronic inflammatory demyelinating polyneuropathy) (HCC) 12/07/2021   History of CVA (cerebrovascular accident) 09/14/2021   Neuropathic pain 06/05/2021   PVC's (premature ventricular contractions) 03/01/2021   Polycystic kidney disease    MGUS (monoclonal gammopathy of unknown significance)    Diverticulosis    Stage 3b chronic kidney disease (HCC) 06/21/2020   B12 deficiency 06/13/2020   Ataxia 02/14/2020   Bilateral leg pain 02/14/2020   Weakness of both lower extremities 01/10/2020   Balance problem 01/10/2020   Nonsustained ventricular tachycardia (HCC) 11/03/2019   Lumbar radiculopathy 05/06/2019   Lumbosacral spondylosis without myelopathy 03/10/2019   Low back pain 03/09/2019   Monoclonal gammopathy of unknown significance (MGUS) 08/24/2018   Primary osteoarthritis of both knees 06/30/2018   Degeneration of lumbar intervertebral disc 06/30/2018   Essential hypertension 04/15/2018   Diverticular disease 04/15/2018   Abdominal aortic atherosclerosis (HCC) 04/15/2018   Elevated diaphragm 04/15/2018   Multiple renal cysts 04/06/2018   Night sweats 03/13/2018   Weakness 03/13/2018   Chronic kidney disease 10/18/2016   ANEMIA-IRON DEFICIENCY 06/23/2007   Gastro-esophageal reflux disease without esophagitis 06/23/2007   BPH (benign prostatic hyperplasia) 06/23/2007   GERD 06/23/2007    Past Medical History:  Diagnosis Date   Abdominal aortic atherosclerosis (HCC) 04/15/2018   ANEMIA-IRON DEFICIENCY 06/23/2007   B12 deficiency 06/13/2020   BPH (benign prostatic hyperplasia) 06/23/2007   Qualifier: Diagnosis of  By: Nunzio, RN, Dagoberto Caldron   Formatting of this note might be different from the original. Formatting of this note might be different  from the original. Qualifier: Diagnosis of  By: Nunzio, RN, Dagoberto Caldron   Degeneration of lumbar intervertebral disc 06/30/2018   Diverticulosis    Elevated diaphragm  04/15/2018   Essential hypertension 04/15/2018   Gastro-esophageal reflux disease without esophagitis 06/23/2007   Qualifier: Diagnosis of  By: Nunzio, RN, Dagoberto Caldron   Formatting of this note might be different from the original. Formatting of this note might be different from the original. Qualifier: Diagnosis of  By: Nunzio, RN, Dagoberto Caldron   Lumbar radiculopathy 05/06/2019   Lumbosacral spondylosis without myelopathy 03/10/2019   Monoclonal gammopathy of unknown significance (MGUS) 08/24/2018   Multiple renal cysts 04/06/2018   Nonsustained ventricular tachycardia (HCC) 11/03/2019   Primary osteoarthritis of both knees 06/30/2018   Bilateral moderate with moderate chondromalacia patella Formatting of this note might be different from the original. Formatting of this note might be different from the original. Bilateral moderate with moderate chondromalacia patella   Stage 3b chronic kidney disease (HCC) 06/21/2020    Family History  Problem Relation Age of Onset   Heart attack Father    Early death Father    Fibromyalgia Sister    Heart attack Brother        leaky valve   Endometriosis Daughter    Migraines Daughter    Neuromuscular disorder Neg Hx    Past Surgical History:  Procedure Laterality Date   HAND SURGERY Right    LUNG SURGERY     for collapsed lung   SPINAL CORD STIMULATOR IMPLANT  01/24/2023   Social History   Social History Narrative   Lives at home with his wife   Retired   Right handed   Caffeine: about 2 cups daily   Immunization History  Administered Date(s) Administered   Fluad Quad(high Dose 65+) 07/28/2019, 09/07/2020, 08/24/2021, 08/13/2022   Fluad Trivalent(High Dose 65+) 07/28/2023   INFLUENZA, HIGH DOSE SEASONAL PF 09/02/2013, 08/21/2016, 08/27/2017, 09/10/2018   Influenza Split 08/30/2011, 09/01/2012   Influenza Whole 09/03/2007, 08/11/2008, 09/13/2009, 08/24/2010   Influenza,inj,Quad PF,6+ Mos 09/05/2014, 08/01/2015   Influenza-Unspecified  08/30/2011, 09/01/2012, 09/02/2013, 09/05/2014, 08/01/2015, 08/21/2016, 08/27/2017, 08/20/2018, 09/10/2018   PFIZER Comirnaty(Gray Top)Covid-19 Tri-Sucrose Vaccine 06/18/2021   PFIZER(Purple Top)SARS-COV-2 Vaccination 11/16/2019, 12/04/2019, 07/11/2020   PPD Test 03/09/2018   Pneumococcal Conjugate-13 09/05/2014   Pneumococcal Polysaccharide-23 11/09/2009   Tdap 08/17/2003   Tetanus 09/02/2013   Zoster Recombinant(Shingrix) 01/20/2015   Zoster, Live 07/31/2014     Objective: Vital Signs: Ht 5' 11 (1.803 m)   Wt 179 lb 3.2 oz (81.3 kg)   BMI 24.99 kg/m    Physical Exam   Musculoskeletal Exam: ***  CDAI Exam: CDAI Score: -- Patient Global: --; Provider Global: -- Swollen: --; Tender: -- Joint Exam 07/07/2024   No joint exam has been documented for this visit   There is currently no information documented on the homunculus. Go to the Rheumatology activity and complete the homunculus joint exam.  Investigation: No additional findings.  Imaging: No results found.  Recent Labs: Lab Results  Component Value Date   WBC 6.1 05/23/2024   HGB 13.5 05/23/2024   PLT 157 05/23/2024   NA 140 05/23/2024   K 4.5 05/23/2024   CL 108 05/23/2024   CO2 23 05/23/2024   GLUCOSE 96 05/23/2024   BUN 23 05/23/2024   CREATININE 1.58 (H) 05/23/2024   BILITOT 0.7 05/23/2024   ALKPHOS 98 05/23/2024   AST 18 05/23/2024   ALT 20 05/23/2024   PROT 6.7 06/28/2024   ALBUMIN 3.6  05/23/2024   CALCIUM  9.0 05/23/2024   GFRAA 48 (L) 04/27/2020   QFTBGOLDPLUS NEGATIVE 06/15/2018    Speciality Comments: No specialty comments available.  Procedures:  No procedures performed Allergies: Amitriptyline, Codeine phosphate, Pregabalin, Tamsulosin , Tape, and Lexapro  [escitalopram  oxalate]   Assessment / Plan:     Visit Diagnoses: No diagnosis found.  Orders: No orders of the defined types were placed in this encounter.  No orders of the defined types were placed in this  encounter.   Face-to-face time spent with patient was *** minutes. Greater than 50% of time was spent in counseling and coordination of care.  Follow-Up Instructions: No follow-ups on file.   Alfonso Patterson, LPN  Note - This record has been created using [employer].  Chart creation errors have been sought, but may not always  have been located. Such creation errors do not reflect on  the standard of medical care.

## 2024-07-09 ENCOUNTER — Telehealth: Payer: Self-pay

## 2024-07-09 NOTE — Telephone Encounter (Signed)
-----   Message from Mabel Mt Wendling sent at 07/09/2024 12:39 PM EDT ----- Plz offer appt. In doc's note, she mentioned she would reach out. I hadn't heard anything but wanted to see if he needed us . Thx. ----- Message ----- From: Luba Stabs, DO Sent: 07/08/2024   4:54 PM EDT To: Mabel Mt Pry, DO

## 2024-07-09 NOTE — Telephone Encounter (Signed)
 Patient is scheduled for 08/02/2024

## 2024-07-10 ENCOUNTER — Ambulatory Visit: Payer: Self-pay

## 2024-07-13 LAB — SYSTEMIC SCLEROSIS (SCLERODERMA) 12 ANTIBODIES PANEL 2
Centromere protein A Ab: <11 SI (ref <11)
Centromere protein B Ab: <11 SI (ref <11)
Fibrillarin Ab: <11 SI (ref <11)
PM-SCL-100 Ab: <11 SI (ref <11)
PM-SCL-75 Ab: <11 SI (ref <11)
RNA polymerase III RP11 Ab: <11 SI (ref <11)
RNA polymerase III RP155 Ab: <11 SI (ref <11)
SCL-70 extractable nuclear Ab: <11 SI (ref <11)
Th-To Ab: <11 SI (ref <11)
U1 small nuclear ribonucleoprotein 70kD Ab: <11 SI (ref <11)
U1 small nuclear ribonucleoprotein A Ab: <11 SI (ref <11)
U1 small nuclear ribonucleoprotein C Ab: <11 SI (ref <11)

## 2024-07-13 LAB — LACTATE DEHYDROGENASE: LDH: 134 U/L (ref 120–250)

## 2024-07-13 LAB — RNP ANTIBODY: Ribonucleic Protein(ENA) Antibody, IgG: <1 AI

## 2024-07-13 LAB — CK: Total CK: 43 U/L (ref 17–247)

## 2024-07-14 ENCOUNTER — Encounter: Payer: Self-pay | Admitting: Medical Oncology

## 2024-07-14 ENCOUNTER — Inpatient Hospital Stay (HOSPITAL_BASED_OUTPATIENT_CLINIC_OR_DEPARTMENT_OTHER): Admitting: Medical Oncology

## 2024-07-14 ENCOUNTER — Inpatient Hospital Stay: Attending: Hematology & Oncology

## 2024-07-14 ENCOUNTER — Inpatient Hospital Stay

## 2024-07-14 VITALS — BP 145/61 | HR 70 | Temp 98.9°F | Resp 17 | Wt 182.0 lb

## 2024-07-14 DIAGNOSIS — K5900 Constipation, unspecified: Secondary | ICD-10-CM | POA: Insufficient documentation

## 2024-07-14 DIAGNOSIS — Z8616 Personal history of COVID-19: Secondary | ICD-10-CM | POA: Diagnosis not present

## 2024-07-14 DIAGNOSIS — D472 Monoclonal gammopathy: Secondary | ICD-10-CM | POA: Diagnosis not present

## 2024-07-14 DIAGNOSIS — D649 Anemia, unspecified: Secondary | ICD-10-CM | POA: Insufficient documentation

## 2024-07-14 DIAGNOSIS — Z79899 Other long term (current) drug therapy: Secondary | ICD-10-CM | POA: Insufficient documentation

## 2024-07-14 LAB — RETIC PANEL
Immature Retic Fract: 8.4 % (ref 2.3–15.9)
RBC.: 4.32 MIL/uL (ref 4.22–5.81)
Retic Count, Absolute: 48.4 K/uL (ref 19.0–186.0)
Retic Ct Pct: 1.1 % (ref 0.4–3.1)
Reticulocyte Hemoglobin: 35.6 pg (ref >27.9)

## 2024-07-14 LAB — CBC WITH DIFFERENTIAL (CANCER CENTER ONLY)
Abs Immature Granulocytes: 0.03 K/uL (ref 0.00–0.07)
Basophils Absolute: 0 K/uL (ref 0.0–0.1)
Basophils Relative: 1 %
Eosinophils Absolute: 0.2 K/uL (ref 0.0–0.5)
Eosinophils Relative: 3 %
HCT: 39.3 % (ref 39.0–52.0)
Hemoglobin: 12.8 g/dL — ABNORMAL LOW (ref 13.0–17.0)
Immature Granulocytes: 0 %
Lymphocytes Relative: 26 %
Lymphs Abs: 1.9 K/uL (ref 0.7–4.0)
MCH: 31.4 pg (ref 26.0–34.0)
MCHC: 32.6 g/dL (ref 30.0–36.0)
MCV: 96.3 fL (ref 80.0–100.0)
Monocytes Absolute: 0.6 K/uL (ref 0.1–1.0)
Monocytes Relative: 8 %
Neutro Abs: 4.5 K/uL (ref 1.7–7.7)
Neutrophils Relative %: 62 %
Platelet Count: 157 K/uL (ref 150–400)
RBC: 4.08 MIL/uL — ABNORMAL LOW (ref 4.22–5.81)
RDW: 12.7 % (ref 11.5–15.5)
WBC Count: 7.2 K/uL (ref 4.0–10.5)
nRBC: 0 % (ref 0.0–0.2)

## 2024-07-14 LAB — CMP (CANCER CENTER ONLY)
ALT: 17 U/L (ref 0–44)
AST: 19 U/L (ref 15–41)
Albumin: 3.8 g/dL (ref 3.5–5.0)
Alkaline Phosphatase: 104 U/L (ref 38–126)
Anion gap: 11 (ref 5–15)
BUN: 32 mg/dL — ABNORMAL HIGH (ref 8–23)
CO2: 27 mmol/L (ref 22–32)
Calcium: 9.1 mg/dL (ref 8.9–10.3)
Chloride: 97 mmol/L — ABNORMAL LOW (ref 98–111)
Creatinine: 2.05 mg/dL — ABNORMAL HIGH (ref 0.61–1.24)
GFR, Estimated: 31 mL/min — ABNORMAL LOW (ref >60)
Glucose, Bld: 124 mg/dL — ABNORMAL HIGH (ref 70–99)
Potassium: 3.9 mmol/L (ref 3.5–5.1)
Sodium: 135 mmol/L (ref 135–145)
Total Bilirubin: 0.5 mg/dL (ref 0.0–1.2)
Total Protein: 6.6 g/dL (ref 6.5–8.1)

## 2024-07-14 LAB — SAVE SMEAR(SSMR), FOR PROVIDER SLIDE REVIEW

## 2024-07-14 LAB — LACTATE DEHYDROGENASE: LDH: 141 U/L (ref 98–192)

## 2024-07-14 LAB — VITAMIN B12: Vitamin B-12: 865 pg/mL (ref 180–914)

## 2024-07-14 LAB — FOLATE: Folate: 17.9 ng/mL (ref >5.9)

## 2024-07-14 LAB — IRON AND IRON BINDING CAPACITY (CC-WL,HP ONLY)
Iron: 71 ug/dL (ref 45–182)
Saturation Ratios: 23 % (ref 17.9–39.5)
TIBC: 314 ug/dL (ref 250–450)
UIBC: 243 ug/dL

## 2024-07-14 LAB — FERRITIN: Ferritin: 120 ng/mL (ref 24–336)

## 2024-07-14 NOTE — Progress Notes (Signed)
 Hematology and Oncology Follow Up Visit  Alex Dunn [id number] [birth year] 88 y.o. 07/14/2024   Principle Diagnosis:  IgA kappa MGUS   Current Therapy:        Observation   Interim History:  Alex Dunn is here today for follow-up. He is here with his wife.   They mention concern over chronic fatigue which seems to have worsened since he got COVID-19 3 weeks ago. He reports that for the past 6 or so years he has had occasional episodes of night sweats and aches that last about 8 hours. These used to occur every 2-3 month but now have been about every 5 weeks or so. Three weeks ago he got COVID and since then fatigue has worsened but he has not yet had any of the nighttime sweating/ache episodes. He does report having seen various specialists regarding these events including infectious disease.   There has been no bleeding to her knowledge: denies epistaxis, gingivitis, hemoptysis, hematemesis, hematuria, melena, excessive bruising, blood donation.   His spinal stimulator is still helping him with his neuropathy. He continues to work with PT and neuropathy for vertigo.   Recent labs from 06/28/2024 show  His IgA level was 492 mg/dL.  His Kappa light chain was 5.2 mg/dL. SPEP pending today   Of note, back in November 2024 and we did a 24-hour urine on him which did not show a monoclonal spike of light chain in his urine.  Of note, his last vitamin B12 level was 927.  He has had no issues with nausea or vomiting.  He has had no problems with cough or shortness of breath.  There is been no change in bowel or bladder habits.  He may have a little bit of constipation.  He has not had any problem with hot flashes or sweats.  Currently, I would say his performance status is probably ECOG 1.   Wt Readings from Last 3 Encounters:  07/14/24 182 lb (82.6 kg)  07/07/24 179 lb 3.2 oz (81.3 kg)  06/28/24 179 lb (81.2 kg)     Medications:  Allergies as of 07/14/2024       Reactions    Amitriptyline Other (See Comments)   SLEPT FOR 18-19 HOURS STRAIGHT   Codeine Phosphate Other (See Comments)   Seen odd things crawling up wall   Pregabalin Other (See Comments)   Drowsy, nightmares, stumbling   Tamsulosin  Other (See Comments)   Faint feeling   Tape Other (See Comments)   Skin irritation   Lexapro  [escitalopram  Oxalate] Other (See Comments)   shaking        Medication List        Accurate as of July 14, 2024  3:19 PM. If you have any questions, ask your nurse or doctor.          acetaminophen  325 MG tablet Commonly known as: TYLENOL  Take 650 mg by mouth 4 (four) times daily as needed for mild pain or moderate pain (pain).   aspirin  EC 81 MG tablet Take 81 mg by mouth daily. Swallow whole.   atorvastatin  10 MG tablet Commonly known as: LIPITOR TAKE 1 TABLET(10 MG) BY MOUTH DAILY   cyanocobalamin  1000 MCG tablet Take 1,000 mcg by mouth daily.   fluticasone  50 MCG/ACT nasal spray Commonly known as: FLONASE  Place 2 sprays into both nostrils daily. What changed:  when to take this reasons to take this   indapamide  1.25 MG tablet Commonly known as: LOZOL  Take 1 tablet (1.25 mg total)  by mouth daily.   pantoprazole  40 MG tablet Commonly known as: PROTONIX  Take 1 tablet (40 mg total) by mouth daily as needed.   PreserVision AREDS Caps Take 1 capsule by mouth daily.   traMADol  50 MG tablet Commonly known as: ULTRAM  Take 1 tablet (50 mg total) by mouth every 12 (twelve) hours as needed.   triamcinolone  cream 0.1 % Commonly known as: KENALOG  Apply 1 Application topically 2 (two) times daily.   Vitamin D  50 MCG (2000 UT) Caps Take 2,000 Units by mouth daily at 6 (six) AM.        Allergies:  Allergies  Allergen Reactions   Amitriptyline Other (See Comments)    SLEPT FOR 18-19 HOURS STRAIGHT   Codeine Phosphate Other (See Comments)    Seen odd things crawling up wall   Pregabalin Other (See Comments)    Drowsy, nightmares,  stumbling   Tamsulosin  Other (See Comments)    Faint feeling    Tape Other (See Comments)    Skin irritation   Lexapro  [Escitalopram  Oxalate] Other (See Comments)    shaking    Past Medical History, Surgical history, Social history, and Family History were reviewed and updated.  Review of Systems: Review of Systems  Constitutional: Negative.   HENT: Negative.    Eyes: Negative.   Respiratory: Negative.    Cardiovascular: Negative.   Gastrointestinal: Negative.   Genitourinary: Negative.   Musculoskeletal: Negative.   Skin: Negative.   Neurological:  Positive for tingling.  Endo/Heme/Allergies: Negative.   Psychiatric/Behavioral: Negative.        Physical Exam:  weight is 182 lb (82.6 kg). His oral temperature is 98.9 F (37.2 C). His blood pressure is 145/61 (abnormal) and his pulse is 70. His respiration is 17 and oxygen saturation is 100%.   Wt Readings from Last 3 Encounters:  07/14/24 182 lb (82.6 kg)  07/07/24 179 lb 3.2 oz (81.3 kg)  06/28/24 179 lb (81.2 kg)   Physical Exam Vitals reviewed.  HENT:     Head: Normocephalic and atraumatic.  Eyes:     Pupils: Pupils are equal, round, and reactive to light.  Cardiovascular:     Rate and Rhythm: Normal rate and regular rhythm.     Heart sounds: Normal heart sounds.  Pulmonary:     Effort: Pulmonary effort is normal.     Breath sounds: Normal breath sounds.  Abdominal:     General: Bowel sounds are normal.     Palpations: Abdomen is soft.  Musculoskeletal:        General: No tenderness or deformity. Normal range of motion.     Cervical back: Normal range of motion.  Lymphadenopathy:     Cervical: No cervical adenopathy.  Skin:    General: Skin is warm and dry.     Findings: No erythema or rash.  Neurological:     Mental Status: He is alert and oriented to person, place, and time.  Psychiatric:        Behavior: Behavior normal.        Thought Content: Thought content normal.        Judgment: Judgment  normal.      Lab Results  Component Value Date   WBC 7.2 07/14/2024   HGB 12.8 (L) 07/14/2024   HCT 39.3 07/14/2024   MCV 96.3 07/14/2024   PLT 157 07/14/2024   Lab Results  Component Value Date   FERRITIN 72 09/06/2021   IRON 87 09/06/2021   TIBC 310 09/06/2021  UIBC 223 09/06/2021   IRONPCTSAT 28 09/06/2021   Lab Results  Component Value Date   RETICCTPCT 0.8 08/12/2018   RBC 4.08 (L) 07/14/2024   Lab Results  Component Value Date   KPAFRELGTCHN 52.3 (H) 06/28/2024   LAMBDASER 25.2 06/28/2024   KAPLAMBRATIO 2.08 (H) 06/28/2024   Lab Results  Component Value Date   IGGSERUM 949 06/28/2024   IGA 464 (H) 08/25/2023   IGMSERUM 72 06/28/2024   Lab Results  Component Value Date   TOTALPROTELP 6.2 08/25/2023   ALBUMINELP 3.6 (L) 06/28/2024   A1GS 0.4 (H) 06/28/2024   A2GS 0.9 06/28/2024   BETS 0.4 06/28/2024   BETA2SER 0.5 06/28/2024   GAMS 1.0 06/28/2024   MSPIKE Not Observed 08/25/2023   SPEI NOTE 06/28/2024     Chemistry      Component Value Date/Time   NA 140 05/23/2024 0542   NA 142 07/01/2019 1154   K 4.5 05/23/2024 0542   CL 108 05/23/2024 0542   CO2 23 05/23/2024 0542   BUN 23 05/23/2024 0542   BUN 25 07/01/2019 1154   CREATININE 1.58 (H) 05/23/2024 0542   CREATININE 1.92 (H) 08/25/2023 1153   CREATININE 1.61 (H) 02/26/2019 1511      Component Value Date/Time   CALCIUM  9.0 05/23/2024 0542   ALKPHOS 98 05/23/2024 0542   AST 18 05/23/2024 0542   AST 15 08/25/2023 1153   ALT 20 05/23/2024 0542   ALT 13 08/25/2023 1153   BILITOT 0.7 05/23/2024 0542   BILITOT 0.7 08/25/2023 1153     Encounter Diagnosis  Name Primary?   MGUS (monoclonal gammopathy of unknown significance) Yes   Impression and Plan: Mr. Kirchgessner is a very pleasant 88 y.o. caucasian gentleman with IgA kappa MGUS.  There is no obvious measurable monoclonal spike in his blood last him that we saw him.    CBC does show some new normocytic anemia with a Hgb of 12.8. Recent light  chain and IgA levels were stable. SPEP pending. May or may be due to recent COVID-19 infection. I did discuss a GI referral with him and am happy to hear that he is already planning on seeing them on Sept 22nd for new constipation. He will certainly need GI work up given the new anemia with constipation.  I have reviewed his recent labs and have suggested additional testing. If indicated the next step would be a BMB. His last was over 4 years ago.  CMP pending  RTC 1 months MD, labs (CBC w/, CMP, light chains, IgG/IgA/IgM, LDH, SPEP, UPEP iron, ferritin, retic, folate, b12, smear)-[place]    Lauraine CHRISTELLA Dais, PA-C 9/10/20253:19 PM

## 2024-07-15 ENCOUNTER — Ambulatory Visit: Payer: Self-pay | Admitting: Medical Oncology

## 2024-07-15 LAB — KAPPA/LAMBDA LIGHT CHAINS
Kappa free light chain: 59.2 mg/L — ABNORMAL HIGH (ref 3.3–19.4)
Kappa, lambda light chain ratio: 1.94 — ABNORMAL HIGH (ref 0.26–1.65)
Lambda free light chains: 30.5 mg/L — ABNORMAL HIGH (ref 5.7–26.3)

## 2024-07-16 LAB — IGG, IGA, IGM
IgA: 441 mg/dL — ABNORMAL HIGH (ref 61–437)
IgG (Immunoglobin G), Serum: 873 mg/dL (ref 603–1613)
IgM (Immunoglobulin M), Srm: 50 mg/dL (ref 15–143)

## 2024-07-16 LAB — PROTEIN ELECTROPHORESIS, SERUM
A/G Ratio: 1 (ref 0.7–1.7)
Albumin ELP: 2.9 g/dL (ref 2.9–4.4)
Alpha-1-Globulin: 0.3 g/dL (ref 0.0–0.4)
Alpha-2-Globulin: 0.8 g/dL (ref 0.4–1.0)
Beta Globulin: 1.2 g/dL (ref 0.7–1.3)
Gamma Globulin: 0.7 g/dL (ref 0.4–1.8)
Globulin, Total: 3 g/dL (ref 2.2–3.9)
Total Protein ELP: 5.9 g/dL — ABNORMAL LOW (ref 6.0–8.5)

## 2024-07-16 LAB — ERYTHROPOIETIN: Erythropoietin: 10.4 m[IU]/mL (ref 2.6–18.5)

## 2024-07-19 ENCOUNTER — Encounter: Payer: Self-pay | Admitting: Family Medicine

## 2024-07-19 ENCOUNTER — Ambulatory Visit (INDEPENDENT_AMBULATORY_CARE_PROVIDER_SITE_OTHER): Admitting: Family Medicine

## 2024-07-19 VITALS — BP 155/74 | HR 72 | Temp 98.0°F | Resp 16 | Ht 71.0 in | Wt 180.4 lb

## 2024-07-19 DIAGNOSIS — I10 Essential (primary) hypertension: Secondary | ICD-10-CM | POA: Diagnosis not present

## 2024-07-19 MED ORDER — CICLOPIROX OLAMINE 0.77 % EX CREA: TOPICAL_CREAM | Freq: Two times a day (BID) | CUTANEOUS | 0 refills | Status: DC

## 2024-07-19 MED ORDER — AMLODIPINE BESYLATE 2.5 MG PO TABS: 2.5000 mg | ORAL_TABLET | Freq: Every day | ORAL | 1 refills | Status: DC

## 2024-07-19 NOTE — Telephone Encounter (Signed)
 Called pt discuss BP and schedule today for BP

## 2024-07-19 NOTE — Patient Instructions (Signed)
 Keep the diet clean and stay active.  Check your blood pressures 2-3 times per week, alternating the time of day you check it. If it is high, considering waiting 1-2 minutes and rechecking. If it gets higher, your anxiety is likely creeping up and we should avoid rechecking.   We are done with anything ending in -sartan and indapamide .   Let us  know if you need anything.

## 2024-07-19 NOTE — Progress Notes (Signed)
 Chief Complaint  Patient presents with   Hypertension    BP    Subjective Alex Dunn is a 88 y.o. male who presents for hypertension follow up. Here w wife.  He does monitor home blood pressures. Blood pressures ranging from 130-170's/80-90's on average. He is compliant with medications- indapamide  1.25 mg/d. Patient has these side effects of medication: lightheadedness ARBs caused low BP/lightheadedness.  He is adhering to a healthy diet overall. Current exercise: some walking No CP or SOB.    Past Medical History:  Diagnosis Date   Abdominal aortic atherosclerosis (HCC) 04/15/2018   ANEMIA-IRON DEFICIENCY 06/23/2007   B12 deficiency 06/13/2020   BPH (benign prostatic hyperplasia) 06/23/2007   Qualifier: Diagnosis of  By: Nunzio, RN, Dagoberto Caldron   Formatting of this note might be different from the original. Formatting of this note might be different from the original. Qualifier: Diagnosis of  By: Nunzio, RN, Dagoberto Caldron   Degeneration of lumbar intervertebral disc 06/30/2018   Diverticulosis    Elevated diaphragm 04/15/2018   Essential hypertension 04/15/2018   Gastro-esophageal reflux disease without esophagitis 06/23/2007   Qualifier: Diagnosis of  By: Nunzio, RN, Dagoberto Caldron   Formatting of this note might be different from the original. Formatting of this note might be different from the original. Qualifier: Diagnosis of  By: Nunzio, RN, Dagoberto Caldron   Lumbar radiculopathy 05/06/2019   Lumbosacral spondylosis without myelopathy 03/10/2019   Monoclonal gammopathy of unknown significance (MGUS) 08/24/2018   Multiple renal cysts 04/06/2018   Nonsustained ventricular tachycardia (HCC) 11/03/2019   Primary osteoarthritis of both knees 06/30/2018   Bilateral moderate with moderate chondromalacia patella Formatting of this note might be different from the original. Formatting of this note might be different from the original. Bilateral moderate with moderate chondromalacia patella   Stage 3b  chronic kidney disease (HCC) 06/21/2020    Exam BP (!) 155/74 (BP Location: Left Arm, Patient Position: Sitting)   Pulse 72   Temp 98 F (36.7 C) (Oral)   Resp 16   Ht 5' 11 (1.803 m)   Wt 180 lb 6.4 oz (81.8 kg)   SpO2 95%   BMI 25.16 kg/m  General:  well developed, well nourished, in no apparent distress Heart: RRR, no bruits, no LE edema Lungs: clear to auscultation, no accessory muscle use Psych: well oriented with normal range of affect and appropriate judgment/insight  Essential hypertension - Plan: amLODipine  (NORVASC ) 2.5 MG tablet  Chronic, uncontrolled. Complex case. Ideal goal likely 140's SBP. Stop indapamide  2/2 AE's. Start Norvasc  2.5 mg/d. Monitor BP at home. Counseled on diet and exercise. F/u in 1 mo. The patient voiced understanding and agreement to the plan.  Mabel Mt Rock, DO 07/19/24  4:57 PM

## 2024-07-20 DIAGNOSIS — I493 Ventricular premature depolarization: Secondary | ICD-10-CM | POA: Diagnosis not present

## 2024-07-21 ENCOUNTER — Other Ambulatory Visit: Payer: Self-pay | Admitting: Family Medicine

## 2024-07-21 ENCOUNTER — Encounter: Payer: Self-pay | Admitting: Family Medicine

## 2024-07-21 MED ORDER — METOPROLOL SUCCINATE ER 25 MG PO TB24: 25.0000 mg | ORAL_TABLET | Freq: Every day | ORAL | 1 refills | Status: DC

## 2024-07-26 ENCOUNTER — Encounter: Payer: Self-pay | Admitting: Gastroenterology

## 2024-07-26 ENCOUNTER — Ambulatory Visit: Payer: Self-pay

## 2024-07-26 ENCOUNTER — Ambulatory Visit (INDEPENDENT_AMBULATORY_CARE_PROVIDER_SITE_OTHER): Admitting: Gastroenterology

## 2024-07-26 ENCOUNTER — Ambulatory Visit: Admitting: Family Medicine

## 2024-07-26 ENCOUNTER — Telehealth: Payer: Self-pay

## 2024-07-26 VITALS — BP 174/84 | HR 85 | Ht 72.0 in | Wt 180.0 lb

## 2024-07-26 DIAGNOSIS — K5900 Constipation, unspecified: Secondary | ICD-10-CM | POA: Diagnosis not present

## 2024-07-26 DIAGNOSIS — R634 Abnormal weight loss: Secondary | ICD-10-CM

## 2024-07-26 DIAGNOSIS — R194 Change in bowel habit: Secondary | ICD-10-CM

## 2024-07-26 DIAGNOSIS — R61 Generalized hyperhidrosis: Secondary | ICD-10-CM | POA: Diagnosis not present

## 2024-07-26 DIAGNOSIS — I493 Ventricular premature depolarization: Secondary | ICD-10-CM

## 2024-07-26 DIAGNOSIS — R5383 Other fatigue: Secondary | ICD-10-CM

## 2024-07-26 NOTE — Telephone Encounter (Signed)
 Appointment scheduled on 10/20/205 @ 10:55am.

## 2024-07-26 NOTE — Patient Instructions (Addendum)
 _______________________________________________________  If your blood pressure at your visit was 140/90 or greater, please contact your primary care physician to follow up on this.  _______________________________________________________  If you are age 86 or older, your body mass index should be between 23-30. Your Body mass index is 24.41 kg/m. If this is out of the aforementioned range listed, please consider follow up with your Primary Care Provider.  If you are age 97 or younger, your body mass index should be between 19-25. Your Body mass index is 24.41 kg/m. If this is out of the aformentioned range listed, please consider follow up with your Primary Care Provider.   ________________________________________________________  The [facility] [place] [facility] providers would like to encourage you to use MYCHART to communicate with providers for non-urgent requests or questions.  Due to long hold times on the telephone, sending your provider a message by [facility] may be a faster and more efficient way to get a response.  Please allow 48 business hours for a response.  Please remember that this is for non-urgent requests.  _______________________________________________________  [facility] is using a team-based approach to care.  Your team is made up of your doctor and two to three APPS. Our APPS (Nurse Practitioners and Physician Assistants) work with your physician to ensure care continuity for you. They are fully qualified to address your health concerns and develop a treatment plan. They communicate directly with your gastroenterologist to care for you. Seeing the Advanced Practice Practitioners on your physician's team can help you by facilitating care more promptly, often allowing for earlier appointments, access to diagnostic testing, procedures, and other specialty referrals.   You have been scheduled for an endoscopy and colonoscopy. Please follow the written instructions given to you at  your visit today.  If you use inhalers (even only as needed), please bring them with you on the day of your procedure.  DO NOT TAKE 7 DAYS PRIOR TO TEST- Trulicity (dulaglutide) Ozempic, Wegovy (semaglutide) Mounjaro (tirzepatide) Bydureon Bcise (exanatide extended release)  DO NOT TAKE 1 DAY PRIOR TO YOUR TEST Rybelsus (semaglutide) Adlyxin (lixisenatide) Victoza (liraglutide) Byetta (exanatide) ___________________________________________________________________________  Due to recent changes in healthcare laws, you may see the results of your imaging and laboratory studies on MyChart before your provider has had a chance to review them.  We understand that in some cases there may be results that are confusing or concerning to you. Not all laboratory results come back in the same time frame and the provider may be waiting for multiple results in order to interpret others.  Please give us  48 hours in order for your provider to thoroughly review all the results before contacting the office for clarification of your results.   It was a pleasure to see you today!  Alex Dunn, D.O.

## 2024-07-26 NOTE — Telephone Encounter (Signed)
 [facility] Pre-operative Risk Assessment     Request for surgical clearance:     Endoscopy Procedure  What type of surgery is being performed?     Endo/Colon  When is this surgery scheduled?     08-30-24  What type of clearance is required ?   Pharmacy  Practice name and name of physician performing surgery?      [facility]  What is your office phone and fax number?      Phone- [phone]  Fax- [phone]  Anesthesia type (None, local, MAC, general) ?       MAC   Please route your response to Nat SAUNDERS, CMA

## 2024-07-26 NOTE — Telephone Encounter (Signed)
 [facility] Pre-operative Risk Assessment     Request for surgical clearance:     Endoscopy Procedure  What type of surgery is being performed?     Endo.Colon  When is this surgery scheduled?     08-30-24  What type of clearance is required ?   Cardiac Clearance  Practice name and name of physician performing surgery?      [facility]  What is your office phone and fax number?      Phone- [phone]  Fax- [phone]  Anesthesia type (None, local, MAC, general) ?       MAC   Please route your response to Nat SAUNDERS, CMA

## 2024-07-26 NOTE — Telephone Encounter (Signed)
   Name: Alex Dunn  DOB: 09/06/1936  MRN: [id number]  Primary Cardiologist: None  Chart reviewed as part of pre-operative protocol coverage. Because of Alex Dunn past medical history and time since last visit, he will require a follow-up in-office visit in order to better assess preoperative cardiovascular risk.  Patient has not seen his primary cardiologist in over 1 year, overdue for follow-up.  Pre-op covering staff: - Please schedule appointment and call patient to inform them. If patient already had an upcoming appointment within acceptable timeframe, please add pre-op clearance to the appointment notes so provider is aware. - Please contact requesting surgeon's office via preferred method (i.e, phone, fax) to inform them of need for appointment prior to surgery.   Damien Alex Braver, NP  07/26/2024, 12:08 PM

## 2024-07-26 NOTE — Telephone Encounter (Signed)
 Alex Dunn. I see you requested a pharmacy clearance. For what medication? It looks like all he is on is Aspirin  which y'all don't typically ask to hold prior to EGD/ colonoscopy.   Please route response back to P CV DIV PREOP.  Thank you! [name]

## 2024-07-26 NOTE — Progress Notes (Signed)
 Chief Complaint: Change in bowel habits, constipation   Referring Provider:     Frann Mabel Mt, DO    HPI:     Alex Dunn is a 88 y.o. male with a history of PVCs, NSVT, HTN, HLD, prior TIA, CKD 3, BPH, MGUS, dsDNA+, diverticulosis, spinal stimulator for neuropathy, Raynaud's, referred to the Gastroenterology Clinic for evaluation of constipation.  Has been dealing with constipation lately.  Sxs started 1 year ago with gradual worsening over the year. Will use OTC laxative (typically MoM) and glycerin supp at times to facilitate BM. +straining to have BM. Will go 3-4 days between BM if not using laxatives or enema (baseline was 1 BM every 2-3 days). Increased belching as well which tends to go away after BM. Feels fatigued after BM.  No hematochezia or melena.  Patient was seen in the GI clinic in 08/2018 for watery, nonbloody stools x 10 days which resolved with Imodium and Pepto-Bismol.  Last colonoscopy was 20+ years ago and denies any history of polyps.  Has been having night sweats without fever for the last few months, and fatigue.  Interestingly, had similar night sweats and hot flashes along with 20# weight loss about 6 years ago which prompted an extended evaluation through Cardiology, rheumatology, etc.  Symptoms eventually abated and weight returned to baseline, but sxs recurred a few months ago.  Has lost >15# over the 3-4 months.  Interestingly, no night sweat flares since Covid infection/Paxlovid  last month.   He follows in the Hematology Clinic, Rheumatology Clinic, Cardiology Clinic, Nephrology Clinic.   Did recently have COVID in August, treated with Paxlovid .  Increased fatigue since then.  Reviewed labs from 07/14/2024: - H/H 12.8/39.3, otherwise normal CBC - BUN/creatinine 32/2.05, otherwise normal CMP - IgA 441 and stable from previous.  Normal IgG, IgM, ferritin, iron panel, B12, folate  No recent abdominal imaging for review.   Past  Medical History:  Diagnosis Date   Abdominal aortic atherosclerosis (HCC) 04/15/2018   ANEMIA-IRON DEFICIENCY 06/23/2007   B12 deficiency 06/13/2020   BPH (benign prostatic hyperplasia) 06/23/2007   Qualifier: Diagnosis of  By: Nunzio, RN, Dagoberto Caldron   Formatting of this note might be different from the original. Formatting of this note might be different from the original. Qualifier: Diagnosis of  By: Nunzio, RN, Dagoberto Caldron   Degeneration of lumbar intervertebral disc 06/30/2018   Diverticulosis    Elevated diaphragm 04/15/2018   Essential hypertension 04/15/2018   Gastro-esophageal reflux disease without esophagitis 06/23/2007   Qualifier: Diagnosis of  By: Nunzio, RN, Dagoberto Caldron   Formatting of this note might be different from the original. Formatting of this note might be different from the original. Qualifier: Diagnosis of  By: Nunzio, RN, Dagoberto Caldron   Lumbar radiculopathy 05/06/2019   Lumbosacral spondylosis without myelopathy 03/10/2019   Monoclonal gammopathy of unknown significance (MGUS) 08/24/2018   Multiple renal cysts 04/06/2018   Nonsustained ventricular tachycardia (HCC) 11/03/2019   Primary osteoarthritis of both knees 06/30/2018   Bilateral moderate with moderate chondromalacia patella Formatting of this note might be different from the original. Formatting of this note might be different from the original. Bilateral moderate with moderate chondromalacia patella   Stage 3b chronic kidney disease (HCC) 06/21/2020     Past Surgical History:  Procedure Laterality Date   HAND SURGERY Right    LUNG SURGERY     for collapsed lung   SPINAL CORD  STIMULATOR IMPLANT  01/24/2023   Family History  Problem Relation Age of Onset   Heart attack Father    Early death Father    Fibromyalgia Sister    Heart attack Brother        leaky valve   Endometriosis Daughter    Migraines Daughter    Neuromuscular disorder Neg Hx    Social History   Tobacco Use   Smoking status: Never     Passive exposure: Never   Smokeless tobacco: Never  Vaping Use   Vaping status: Never Used  Substance Use Topics   Alcohol use: Never   Drug use: Never   Current Outpatient Medications  Medication Sig Dispense Refill   acetaminophen  (TYLENOL ) 325 MG tablet Take 650 mg by mouth 4 (four) times daily as needed for mild pain or moderate pain (pain).     aspirin  EC 81 MG tablet Take 81 mg by mouth daily. Swallow whole.     atorvastatin  (LIPITOR) 10 MG tablet TAKE 1 TABLET(10 MG) BY MOUTH DAILY 90 tablet 3   Cholecalciferol (VITAMIN D ) 50 MCG (2000 UT) CAPS Take 2,000 Units by mouth daily at 6 (six) AM.     ciclopirox  (LOPROX ) 0.77 % cream Apply topically 2 (two) times daily. 15 g 0   cyanocobalamin  1000 MCG tablet Take 1,000 mcg by mouth daily.     fluticasone  (FLONASE ) 50 MCG/ACT nasal spray Place 2 sprays into both nostrils daily. (Patient taking differently: Place 2 sprays into both nostrils as needed.) 16 g 6   metoprolol  succinate (TOPROL -XL) 25 MG 24 hr tablet Take 1 tablet (25 mg total) by mouth daily. 30 tablet 1   Multiple Vitamins-Minerals (PRESERVISION AREDS) CAPS Take 1 capsule by mouth daily.     pantoprazole  (PROTONIX ) 40 MG tablet Take 1 tablet (40 mg total) by mouth daily as needed. 90 tablet 0   traMADol  (ULTRAM ) 50 MG tablet Take 1 tablet (50 mg total) by mouth every 12 (twelve) hours as needed. 90 tablet 1   No current facility-administered medications for this visit.   Allergies  Allergen Reactions   Amitriptyline Other (See Comments)    SLEPT FOR 18-19 HOURS STRAIGHT   Codeine Phosphate Other (See Comments)    Seen odd things crawling up wall   Pregabalin Other (See Comments)    Drowsy, nightmares, stumbling   Tamsulosin  Other (See Comments)    Faint feeling    Tape Other (See Comments)    Skin irritation   Lexapro  [Escitalopram  Oxalate] Other (See Comments)    shaking     Review of Systems: All systems reviewed and negative except where noted in HPI.      Physical Exam:    Wt Readings from Last 3 Encounters:  07/26/24 180 lb (81.6 kg)  07/19/24 180 lb 6.4 oz (81.8 kg)  07/14/24 182 lb (82.6 kg)    BP (!) 174/84   Pulse 85   Ht 6' (1.829 m)   Wt 180 lb (81.6 kg)   BMI 24.41 kg/m  Constitutional:  Pleasant, in no acute distress. Psychiatric: Normal mood and affect. Behavior is normal. EENT: Pupils normal.  Conjunctivae are normal. No scleral icterus. Neck supple. No cervical LAD. Cardiovascular: Normal rate, regular rhythm. No edema Pulmonary/chest: Effort normal and breath sounds normal. No wheezing, rales or rhonchi. Abdominal: Soft, nondistended, nontender. Bowel sounds active throughout. There are no masses palpable. No hepatomegaly. Neurological: Alert and oriented to person place and time. Skin: Skin is warm and dry. No rashes noted.  ASSESSMENT AND PLAN;   1) Constipation 2) Change in bowel habits 3) Unintentional weight loss 4) Night sweats 5) Fatigue  - EGD with biopsy - Colonoscopy with biopsy - Cardiology clearance to proceed - Miralax prep - 2 days of clears prior to procedures - Discussed risks of endoscopic procedures and anesthesia at length, particular given age and underlying comorbidities.  Given preponderance of symptoms and progressive nature of symptoms, he would like to proceed with EGD/colonoscopy as discussed  6) PVCs 7) HTN - Will request formal cardiology clearance to proceed with EGD and colonoscopy  The indications, risks, and benefits of EGD and colonoscopy were explained to the patient in detail. Risks include but are not limited to bleeding, perforation, adverse reaction to medications, and cardiopulmonary compromise. Sequelae include but are not limited to the possibility of surgery, hospitalization, and mortality. The patient verbalized understanding and wished to proceed. All questions answered, referred to scheduler and bowel prep ordered. Further recommendations pending results of  the exam.      Sandor LULLA Flatter, DO, FACG  07/26/2024, 10:35 AM   Frann, Mabel Mt*

## 2024-07-27 ENCOUNTER — Ambulatory Visit: Admitting: Pharmacist Clinician (PhC)/ Clinical Pharmacy Specialist

## 2024-07-28 ENCOUNTER — Ambulatory Visit: Payer: Self-pay | Admitting: Pulmonary Disease

## 2024-07-28 ENCOUNTER — Other Ambulatory Visit (HOSPITAL_COMMUNITY): Payer: Self-pay

## 2024-07-28 ENCOUNTER — Other Ambulatory Visit (HOSPITAL_BASED_OUTPATIENT_CLINIC_OR_DEPARTMENT_OTHER): Payer: Self-pay

## 2024-07-28 ENCOUNTER — Other Ambulatory Visit: Payer: Self-pay

## 2024-07-28 MED ORDER — DILTIAZEM HCL ER COATED BEADS 120 MG PO CP24
120.0000 mg | ORAL_CAPSULE | Freq: Every day | ORAL | 3 refills | Status: AC
  Filled 2024-07-28: qty 90, 90d supply, fill #0
  Filled 2024-07-29: qty 30, 30d supply, fill #0
  Filled 2024-08-31: qty 30, 30d supply, fill #1
  Filled 2024-09-28: qty 30, 30d supply, fill #2
  Filled 2024-11-01: qty 30, 30d supply, fill #3
  Filled 2024-11-24: qty 30, 30d supply, fill #4

## 2024-07-28 MED ORDER — DILTIAZEM HCL ER COATED BEADS 120 MG PO CP24
120.0000 mg | ORAL_CAPSULE | Freq: Every day | ORAL | 3 refills | Status: DC
  Filled 2024-07-28: qty 90, 90d supply, fill #0

## 2024-07-28 NOTE — Telephone Encounter (Signed)
 Pt's medication was sent to pt's pharmacy as requested. Confirmation received.

## 2024-07-28 NOTE — Telephone Encounter (Signed)
  Patient was called and made him aware of the plan of Care changes as described below . Teach Back method used and patient verbalized changes to be made.  He was open to having the script called into [facility] on [address].

## 2024-07-29 ENCOUNTER — Other Ambulatory Visit (HOSPITAL_BASED_OUTPATIENT_CLINIC_OR_DEPARTMENT_OTHER): Payer: Self-pay

## 2024-07-29 ENCOUNTER — Other Ambulatory Visit: Payer: Self-pay

## 2024-08-02 ENCOUNTER — Ambulatory Visit: Admitting: Family Medicine

## 2024-08-09 ENCOUNTER — Emergency Department (HOSPITAL_COMMUNITY)

## 2024-08-09 ENCOUNTER — Encounter (HOSPITAL_COMMUNITY): Payer: Self-pay | Admitting: Emergency Medicine

## 2024-08-09 ENCOUNTER — Other Ambulatory Visit: Payer: Self-pay

## 2024-08-09 ENCOUNTER — Emergency Department (EMERGENCY_DEPARTMENT_HOSPITAL)

## 2024-08-09 ENCOUNTER — Emergency Department (HOSPITAL_COMMUNITY)
Admission: EM | Admit: 2024-08-09 | Discharge: 2024-08-09 | Disposition: A | Discharge status: Home / Self Care | Attending: Emergency Medicine | Admitting: Emergency Medicine

## 2024-08-09 DIAGNOSIS — R600 Localized edema: Secondary | ICD-10-CM | POA: Insufficient documentation

## 2024-08-09 DIAGNOSIS — N183 Chronic kidney disease, stage 3 unspecified: Secondary | ICD-10-CM | POA: Diagnosis not present

## 2024-08-09 DIAGNOSIS — N400 Enlarged prostate without lower urinary tract symptoms: Secondary | ICD-10-CM | POA: Insufficient documentation

## 2024-08-09 DIAGNOSIS — Z7982 Long term (current) use of aspirin: Secondary | ICD-10-CM | POA: Insufficient documentation

## 2024-08-09 DIAGNOSIS — K4020 Bilateral inguinal hernia, without obstruction or gangrene, not specified as recurrent: Secondary | ICD-10-CM | POA: Diagnosis not present

## 2024-08-09 DIAGNOSIS — E785 Hyperlipidemia, unspecified: Secondary | ICD-10-CM | POA: Insufficient documentation

## 2024-08-09 DIAGNOSIS — K5730 Diverticulosis of large intestine without perforation or abscess without bleeding: Secondary | ICD-10-CM | POA: Diagnosis not present

## 2024-08-09 DIAGNOSIS — Z8673 Personal history of transient ischemic attack (TIA), and cerebral infarction without residual deficits: Secondary | ICD-10-CM | POA: Insufficient documentation

## 2024-08-09 DIAGNOSIS — M7989 Other specified soft tissue disorders: Secondary | ICD-10-CM

## 2024-08-09 DIAGNOSIS — K5900 Constipation, unspecified: Secondary | ICD-10-CM | POA: Diagnosis not present

## 2024-08-09 DIAGNOSIS — K429 Umbilical hernia without obstruction or gangrene: Secondary | ICD-10-CM | POA: Diagnosis not present

## 2024-08-09 DIAGNOSIS — I10 Essential (primary) hypertension: Secondary | ICD-10-CM | POA: Diagnosis not present

## 2024-08-09 DIAGNOSIS — N281 Cyst of kidney, acquired: Secondary | ICD-10-CM | POA: Diagnosis not present

## 2024-08-09 DIAGNOSIS — R1030 Lower abdominal pain, unspecified: Secondary | ICD-10-CM | POA: Diagnosis not present

## 2024-08-09 DIAGNOSIS — R109 Unspecified abdominal pain: Secondary | ICD-10-CM

## 2024-08-09 DIAGNOSIS — I129 Hypertensive chronic kidney disease with stage 1 through stage 4 chronic kidney disease, or unspecified chronic kidney disease: Secondary | ICD-10-CM | POA: Diagnosis not present

## 2024-08-09 LAB — CBC WITH DIFFERENTIAL/PLATELET
Abs Immature Granulocytes: 0.02 K/uL (ref 0.00–0.07)
Basophils Absolute: 0 K/uL (ref 0.0–0.1)
Basophils Relative: 1 %
Eosinophils Absolute: 0.2 K/uL (ref 0.0–0.5)
Eosinophils Relative: 3 %
HCT: 42.4 % (ref 39.0–52.0)
Hemoglobin: 13.6 g/dL (ref 13.0–17.0)
Immature Granulocytes: 0 %
Lymphocytes Relative: 27 %
Lymphs Abs: 1.9 K/uL (ref 0.7–4.0)
MCH: 30.8 pg (ref 26.0–34.0)
MCHC: 32.1 g/dL (ref 30.0–36.0)
MCV: 96.1 fL (ref 80.0–100.0)
Monocytes Absolute: 0.5 K/uL (ref 0.1–1.0)
Monocytes Relative: 8 %
Neutro Abs: 4.2 K/uL (ref 1.7–7.7)
Neutrophils Relative %: 61 %
Platelets: 183 K/uL (ref 150–400)
RBC: 4.41 MIL/uL (ref 4.22–5.81)
RDW: 13 % (ref 11.5–15.5)
WBC: 6.9 K/uL (ref 4.0–10.5)
nRBC: 0 % (ref 0.0–0.2)

## 2024-08-09 LAB — TROPONIN T, HIGH SENSITIVITY
Troponin T High Sensitivity: 48 ng/L — ABNORMAL HIGH (ref 0–19)
Troponin T High Sensitivity: 46 ng/L — ABNORMAL HIGH (ref 0–19)

## 2024-08-09 LAB — COMPREHENSIVE METABOLIC PANEL WITH GFR
ALT: 19 U/L (ref 0–44)
AST: 23 U/L (ref 15–41)
Albumin: 3.8 g/dL (ref 3.5–5.0)
Alkaline Phosphatase: 125 U/L (ref 38–126)
Anion gap: 11 (ref 5–15)
BUN: 19 mg/dL (ref 8–23)
CO2: 22 mmol/L (ref 22–32)
Calcium: 9.4 mg/dL (ref 8.9–10.3)
Chloride: 106 mmol/L (ref 98–111)
Creatinine, Ser: 1.57 mg/dL — ABNORMAL HIGH (ref 0.61–1.24)
GFR, Estimated: 43 mL/min — ABNORMAL LOW (ref >60)
Glucose, Bld: 104 mg/dL — ABNORMAL HIGH (ref 70–99)
Potassium: 4.3 mmol/L (ref 3.5–5.1)
Sodium: 140 mmol/L (ref 135–145)
Total Bilirubin: 0.7 mg/dL (ref 0.0–1.2)
Total Protein: 6.6 g/dL (ref 6.5–8.1)

## 2024-08-09 LAB — URINALYSIS, ROUTINE W REFLEX MICROSCOPIC
Bilirubin Urine: NEGATIVE
Glucose, UA: NEGATIVE mg/dL
Hgb urine dipstick: NEGATIVE
Ketones, ur: NEGATIVE mg/dL
Leukocytes,Ua: NEGATIVE
Nitrite: NEGATIVE
Protein, ur: NEGATIVE mg/dL
Specific Gravity, Urine: 1.025 (ref 1.005–1.030)
pH: 7 (ref 5.0–8.0)

## 2024-08-09 LAB — PRO BRAIN NATRIURETIC PEPTIDE: Pro Brain Natriuretic Peptide: 341 pg/mL — ABNORMAL HIGH (ref <300.0)

## 2024-08-09 LAB — LIPASE, BLOOD: Lipase: 29 U/L (ref 11–51)

## 2024-08-09 MED ORDER — IOHEXOL 300 MG/ML  SOLN
75.0000 mL | Freq: Once | INTRAMUSCULAR | Status: AC | PRN
  Administered 2024-08-09: 75 mL via INTRAVENOUS

## 2024-08-09 MED ORDER — PHENAZOPYRIDINE HCL 100 MG PO TABS
100.0000 mg | ORAL_TABLET | Freq: Once | ORAL | Status: AC
  Administered 2024-08-09: 100 mg via ORAL
  Filled 2024-08-09: qty 1

## 2024-08-09 MED ORDER — ONDANSETRON HCL 4 MG/2ML IJ SOLN
4.0000 mg | Freq: Once | INTRAMUSCULAR | Status: AC
  Administered 2024-08-09: 4 mg via INTRAVENOUS
  Filled 2024-08-09: qty 2

## 2024-08-09 MED ORDER — RIVAROXABAN 15 MG PO TABS
15.0000 mg | ORAL_TABLET | Freq: Once | ORAL | Status: AC
  Administered 2024-08-09: 15 mg via ORAL
  Filled 2024-08-09: qty 1

## 2024-08-09 MED ORDER — RIVAROXABAN (XARELTO) VTE STARTER PACK (15 & 20 MG): ORAL_TABLET | ORAL | 0 refills | Status: DC

## 2024-08-09 NOTE — ED Notes (Signed)
 Patient transported to CT

## 2024-08-09 NOTE — Progress Notes (Signed)
 Bilateral lower extremity venous duplex has been completed.  Results can be found in chart review under CV Proc.  08/09/2024 3:56 PM  Chancellor Vanderloop Elden Appl, RVT.

## 2024-08-09 NOTE — Consult Note (Signed)
 Reason for Consult: Hernia Referring Physician: Dr. Suzette Tanda LELON Alex Dunn is an 88 y.o. male.  HPI: The patient is an 88 year old white male who has an underlying neuropathy involving his lower extremities.  He began having some lower abdominal and leg pain yesterday.  The pain was severe enough to bring him to the emergency department.  He denies any nausea or vomiting.  He has had some problems with constipation as well.  He took some stool softeners and laxatives at home and finally had a bowel movement.  Past Medical History:  Diagnosis Date   Abdominal aortic atherosclerosis 04/15/2018   ANEMIA-IRON DEFICIENCY 06/23/2007   B12 deficiency 06/13/2020   BPH (benign prostatic hyperplasia) 06/23/2007   Qualifier: Diagnosis of  By: Nunzio, RN, Dagoberto Caldron   Formatting of this note might be different from the original. Formatting of this note might be different from the original. Qualifier: Diagnosis of  By: Nunzio, RN, Dagoberto Caldron   Degeneration of lumbar intervertebral disc 06/30/2018   Diverticulosis    Elevated diaphragm 04/15/2018   Essential hypertension 04/15/2018   Gastro-esophageal reflux disease without esophagitis 06/23/2007   Qualifier: Diagnosis of  By: Nunzio, RN, Dagoberto Caldron   Formatting of this note might be different from the original. Formatting of this note might be different from the original. Qualifier: Diagnosis of  By: Nunzio, RN, Dagoberto Caldron   Lumbar radiculopathy 05/06/2019   Lumbosacral spondylosis without myelopathy 03/10/2019   Monoclonal gammopathy of unknown significance (MGUS) 08/24/2018   Multiple renal cysts 04/06/2018   Nonsustained ventricular tachycardia (HCC) 11/03/2019   Primary osteoarthritis of both knees 06/30/2018   Bilateral moderate with moderate chondromalacia patella Formatting of this note might be different from the original. Formatting of this note might be different from the original. Bilateral moderate with moderate chondromalacia patella   Stage 3b  chronic kidney disease (HCC) 06/21/2020    Past Surgical History:  Procedure Laterality Date   HAND SURGERY Right    LUNG SURGERY     for collapsed lung   SPINAL CORD STIMULATOR IMPLANT  01/24/2023    Family History  Problem Relation Age of Onset   Heart attack Father    Early death Father    Fibromyalgia Sister    Heart attack Brother        leaky valve   Endometriosis Daughter    Migraines Daughter    Neuromuscular disorder Neg Hx     Social History:  reports that he has never smoked. He has never been exposed to tobacco smoke. He has never used smokeless tobacco. He reports that he does not drink alcohol and does not use drugs.  Allergies:  Allergies  Allergen Reactions   Amitriptyline Other (See Comments)    SLEPT FOR 18-19 HOURS STRAIGHT   Codeine Phosphate Other (See Comments)    Seen odd things crawling up wall   Pregabalin Other (See Comments)    Drowsy, nightmares, stumbling   Tamsulosin  Other (See Comments)    Faint feeling    Tape Other (See Comments)    Skin irritation   Lexapro  [Escitalopram  Oxalate] Other (See Comments)    shaking    Medications: I have reviewed the patient's current medications.  Results for orders placed or performed during the hospital encounter of 08/09/24 (from the past 48 hours)  Urinalysis, Routine w reflex microscopic -Urine, Clean Catch     Status: Abnormal   Collection Time: 08/09/24  4:55 AM  Result Value Ref Range   Color, Urine  STRAW (A) YELLOW   APPearance CLEAR CLEAR   Specific Gravity, Urine 1.025 1.005 - 1.030   pH 7.0 5.0 - 8.0   Glucose, UA NEGATIVE NEGATIVE mg/dL   Hgb urine dipstick NEGATIVE NEGATIVE   Bilirubin Urine NEGATIVE NEGATIVE   Ketones, ur NEGATIVE NEGATIVE mg/dL   Protein, ur NEGATIVE NEGATIVE mg/dL   Nitrite NEGATIVE NEGATIVE   Leukocytes,Ua NEGATIVE NEGATIVE    Comment: Performed at [facility], [address] [place], [place] [address]  CBC with Differential      Status: None   Collection Time: 08/09/24  4:56 AM  Result Value Ref Range   WBC 6.9 4.0 - 10.5 K/uL   RBC 4.41 4.22 - 5.81 MIL/uL   Hemoglobin 13.6 13.0 - 17.0 g/dL   HCT 57.5 60.9 - 47.9 %   MCV 96.1 80.0 - 100.0 fL   MCH 30.8 26.0 - 34.0 pg   MCHC 32.1 30.0 - 36.0 g/dL   RDW 86.9 88.4 - 84.4 %   Platelets 183 150 - 400 K/uL   nRBC 0.0 0.0 - 0.2 %   Neutrophils Relative % 61 %   Neutro Abs 4.2 1.7 - 7.7 K/uL   Lymphocytes Relative 27 %   Lymphs Abs 1.9 0.7 - 4.0 K/uL   Monocytes Relative 8 %   Monocytes Absolute 0.5 0.1 - 1.0 K/uL   Eosinophils Relative 3 %   Eosinophils Absolute 0.2 0.0 - 0.5 K/uL   Basophils Relative 1 %   Basophils Absolute 0.0 0.0 - 0.1 K/uL   Immature Granulocytes 0 %   Abs Immature Granulocytes 0.02 0.00 - 0.07 K/uL    Comment: Performed at [facility], [address], [place], [place] [address]  Comprehensive metabolic panel     Status: Abnormal   Collection Time: 08/09/24  4:56 AM  Result Value Ref Range   Sodium 140 135 - 145 mmol/L   Potassium 4.3 3.5 - 5.1 mmol/L   Chloride 106 98 - 111 mmol/L   CO2 22 22 - 32 mmol/L   Glucose, Bld 104 (H) 70 - 99 mg/dL    Comment: Glucose reference range applies only to samples taken after fasting for at least 8 hours.   BUN 19 8 - 23 mg/dL   Creatinine, Ser 8.42 (H) 0.61 - 1.24 mg/dL   Calcium  9.4 8.9 - 10.3 mg/dL   Total Protein 6.6 6.5 - 8.1 g/dL   Albumin 3.8 3.5 - 5.0 g/dL   AST 23 15 - 41 U/L   ALT 19 0 - 44 U/L   Alkaline Phosphatase 125 38 - 126 U/L   Total Bilirubin 0.7 0.0 - 1.2 mg/dL   GFR, Estimated 43 (L) >60 mL/min    Comment: (NOTE) Calculated using the CKD-EPI Creatinine Equation (2021)    Anion gap 11 5 - 15    Comment: Performed at [facility], [address] [place], [place] [address]  Lipase, blood     Status: None   Collection Time: 08/09/24  4:56 AM  Result Value Ref Range   Lipase 29 11 - 51 U/L    Comment: Performed at [facility], [address], [place], [place] [address]  Pro Brain natriuretic peptide     Status: Abnormal   Collection Time: 08/09/24  4:56 AM  Result Value Ref Range   Pro Brain Natriuretic Peptide 341.0 (H) <300.0 pg/mL    Comment: (NOTE) Age Group  Cut-Points    Interpretation  < 50 years     450 pg/mL       NT-proBNP > 450 pg/mL indicates                                ADHF is likely              50 to 75 years  900 pg/mL      NT-proBNP > 900 pg/mL indicates          ADHF is likely  > 75 years      1800 pg/mL     NT-proBNP > 1800 pg/mL indicates          ADHF is likely                           All ages    Results between       Indeterminate. Further clinical             300 and the cut-   information is needed to determine            point for age group   if ADHF is present.                                                             Elecsys proBNP II/ Elecsys proBNP II STAT           Cut-Point                       Interpretation  300 pg/mL                    NT-proBNP <300pg/mL indicates                             ADHF is not likely  Performed at [facility], [address] [place], [place] [address]   Troponin T, High Sensitivity     Status: Abnormal   Collection Time: 08/09/24  4:56 AM  Result Value Ref Range   Troponin T High Sensitivity 48 (H) 0 - 19 ng/L    Comment: (NOTE) Biotin concentrations > 1000 ng/mL falsely decrease TnT results.  Serial cardiac troponin measurements are suggested.  Refer to the Links section for chest pain algorithms and additional  guidance. Performed at [facility], [address], [place], [place] [address]   Troponin T, High Sensitivity     Status: Abnormal   Collection Time: 08/09/24  7:12 AM  Result Value Ref Range   Troponin T High Sensitivity 46 (H) 0 - 19 ng/L    Comment: (NOTE) Biotin concentrations > 1000 ng/mL falsely decrease TnT results.  Serial cardiac  troponin measurements are suggested.  Refer to the Links section for chest pain algorithms and additional  guidance. Performed at [facility], [address] [place], [place] [address]     CT ABDOMEN PELVIS W CONTRAST Result Date: 08/09/2024 EXAM: CT ABDOMEN AND PELVIS WITH CONTRAST 08/09/2024 05:56:23 AM TECHNIQUE: CT of the abdomen and pelvis was performed with the administration of 75 mL  of iohexol  (OMNIPAQUE ) 300 MG/ML solution. Multiplanar reformatted images are provided for review. Automated exposure control, iterative reconstruction, and/or weight-based adjustment of the mA/kV was utilized to reduce the radiation dose to as low as reasonably achievable. COMPARISON: 11/11/2021 CLINICAL HISTORY: Abdominal pain, acute, nonlocalized. FINDINGS: LOWER CHEST: Unchanged 3 mm nodule within the periphery of the left lower lobe, image 46/6. This is compatible with a benign nodule requiring no further follow up. LIVER: The liver is unremarkable. GALLBLADDER AND BILE DUCTS: Gallbladder is unremarkable. No biliary ductal dilatation. SPLEEN: No acute abnormality. PANCREAS: No acute abnormality. ADRENAL GLANDS: No acute abnormality. KIDNEYS, URETERS AND BLADDER: Bilateral kidney cysts. The largest arises off the posterior cortex of the right kidney measuring 4.1 cm. These are compatible with Bosniak class 1 and 2 kidney cysts. No follow up imaging recommended. Per consensus, no follow-up is needed for simple Bosniak type 1 and 2 renal cysts, unless the patient has a malignancy history or risk factors. No stones in the kidneys or ureters. No hydronephrosis. No perinephric or periureteral stranding. Urinary bladder is unremarkable. GI AND BOWEL: Stomach demonstrates no acute abnormality. There is a right inguinal hernia containing a knuckle of small bowel within the proximal right inguinal canal, image 88/2. There is mild increased caliber of the proximal small bowel loops within the right  hemiabdomen leading up to the hernia, measuring 2 cm in maximum diameter, with transition to decreased caliber small bowel noted distally. The appendix is visualized and appears normal. Sigmoid diverticulosis without signs of acute diverticulitis. PERITONEUM AND RETROPERITONEUM: No free fluid or fluid collections. No signs of pneumoperitoneum. No ascites. No free air. VASCULATURE: Aortic atherosclerosis. Aorta is normal in caliber. LYMPH NODES: No lymphadenopathy. REPRODUCTIVE ORGANS: Prostate gland enlargement. BONES AND SOFT TISSUES: Visualized osseous structures are unremarkable. Dorsal column stimulator is identified with lead terminating in the lower thoracic canal. Small fat-containing umbilical hernia and moderate to large fat-containing left inguinal hernia. No acute osseous abnormality. No focal soft tissue abnormality. IMPRESSION: 1. Moderate right inguinal hernia containing a knuckle of small bowel with mild proximal small-bowel dilation (up to 2 cm) and transition to decreased caliber distally; no sings of high grade bowel obstruction or inflammation. 2. Sigmoid diverticulosis without acute diverticulitis. 3. Aortic atherosclerotic calcification. Electronically signed by: Waddell Calk MD 08/09/2024 06:22 AM EDT RP Workstation: [id number]    Review of Systems  Constitutional: Negative.   HENT: Negative.    Eyes: Negative.   Respiratory: Negative.    Cardiovascular: Negative.   Gastrointestinal:  Positive for abdominal pain. Negative for nausea and vomiting.  Endocrine: Negative.   Genitourinary: Negative.   Musculoskeletal: Negative.   Skin: Negative.   Allergic/Immunologic: Negative.   Neurological: Negative.   Hematological: Negative.   Psychiatric/Behavioral: Negative.     Blood pressure (!) 160/72, pulse 72, temperature 98 F (36.7 C), temperature source Oral, resp. rate 16, SpO2 95%. Physical Exam Vitals reviewed.  Constitutional:      General: He is not in acute distress.     Appearance: Normal appearance.  HENT:     Head: Normocephalic and atraumatic.     Right Ear: External ear normal.     Left Ear: External ear normal.     Nose: Nose normal.     Mouth/Throat:     Mouth: Mucous membranes are moist.     Pharynx: Oropharynx is clear.  Eyes:     General: No scleral icterus.    Extraocular Movements: Extraocular movements intact.     Conjunctiva/sclera: Conjunctivae normal.  Pupils: Pupils are equal, round, and reactive to light.  Cardiovascular:     Rate and Rhythm: Normal rate and regular rhythm.     Pulses: Normal pulses.     Heart sounds: Normal heart sounds.  Pulmonary:     Effort: Pulmonary effort is normal. No respiratory distress.     Breath sounds: Normal breath sounds.  Abdominal:     General: Abdomen is flat. There is no distension.     Palpations: Abdomen is soft.     Tenderness: There is no abdominal tenderness.  Genitourinary:    Comments: There is a very small impulse with straining in the right groin that reduces easily.  There is a moderate size bulge in the left groin that also reduces easily.  There is no sign of obstruction. Musculoskeletal:        General: No swelling or deformity. Normal range of motion.     Cervical back: Normal range of motion and neck supple. No rigidity.  Skin:    General: Skin is warm and dry.     Coloration: Skin is not jaundiced.  Neurological:     General: No focal deficit present.     Mental Status: He is alert and oriented to person, place, and time.     Comments: He does have an unexplained neuropathy involving his lower extremities  Psychiatric:        Mood and Affect: Mood normal.        Behavior: Behavior normal.     Assessment/Plan: The patient appears to have a small reducible asymptomatic right inguinal hernia and a small to medium sized left inguinal hernia that does have some mild discomfort with palpation.  There is no sign of incarceration or strangulation.  There is no sign of bowel  obstruction.  At this point I believe he can follow-up with us  as an outpatient to schedule elective hernia repair if he would like.  He is also scheduled to have a colonoscopy in the next month and I feel it would be good to complete this before surgery.  It is not clear what the source of his pain was but it seems to have resolved.  We will be happy to see him as an outpatient in the next week or 2 and set him up for elective surgery if he would like.  Deward Null III 08/09/2024, 8:33 AM

## 2024-08-09 NOTE — ED Provider Notes (Signed)
 Patient turned over to me awaiting Doppler studies.  Patient does have a peroneal DVT in the left leg.  Does have some leg swelling.  No significant hypoxia or chest pain.  Patient's troponins were stable.  Patient was seen by general surgery regarding inguinal hernia they will see him as an outpatient for that.  Patient also has colonoscopy scheduled for later this month.  Since we are going to start Xarelto starter pack.  Will have him follow-up with his primary care doctor he will keep his appointment to follow-up with general surgery but this may change their game plan on repair.  They felt he definitely could have an outpatient elective repair.  Also will need to contact his GI doctor regarding the colonoscopy.  Will have him return for any worsening shortness of breath or chest pain.   Kelcey Wickstrom, MD 08/09/24 1714

## 2024-08-09 NOTE — ED Provider Notes (Signed)
 [place] EMERGENCY DEPARTMENT AT [facility] Provider Note   CSN: [id number] Arrival date & time: 08/09/24  [id number]     Patient presents with: Constipation   Alex Dunn is a 88 y.o. male.   88 y.o. male with a history of PVCs, NSVT, HTN, HLD, prior TIA, CKD 3, BPH, MGUS, dsDNA+, diverticulosis, spinal stimulator for neuropathy, Raynaud's, Presents with constipation.  He has a history of such and follows with gastroenterology for chronic constipation.  He is being scheduled for colonoscopy after reporting his last was about 30 years ago.  States his last bowel movement was about 4 days ago but very small.  Last bowel before that was about a week.  States he cannot go as he takes milk of magnesia or glycerin suppositories.  Comes in today because he was having sensation of needing to have a bowel movement but unable to.  Denies significant abdominal pain but does have some periumbilical cramping.  When he woke up this morning he was uncomfortable with pain to his hips and upper legs which is now resolved.  Denies any back pain.  Nausea but no vomiting.  No chest pain or shortness of breath.  Does have chronic leg swelling left greater than right which is at about baseline.  No previous abdominal surgeries but does have a spinal stimulator for neuropathy.  Believes he is emptying his bladder appropriately though does have a history of BPH.  No bowel or bladder incontinence.  No fever.  This is similar to his other issues with constipation but is never been to this extent  The history is provided by the patient and the spouse.  Constipation Associated symptoms: abdominal pain and nausea   Associated symptoms: no dysuria, no fever and no vomiting        Prior to Admission medications   Medication Sig Start Date End Date Taking? Authorizing Provider  acetaminophen  (TYLENOL ) 325 MG tablet Take 650 mg by mouth 4 (four) times daily as needed for mild pain or moderate pain (pain).     [provider]  aspirin  EC 81 MG tablet Take 81 mg by mouth daily. Swallow whole.    [provider]  atorvastatin  (LIPITOR) 10 MG tablet TAKE 1 TABLET(10 MG) BY MOUTH DAILY 07/08/23   Wendling, Mabel Mt, DO  Cholecalciferol (VITAMIN D ) 50 MCG (2000 UT) CAPS Take 2,000 Units by mouth daily at 6 (six) AM.    [provider]  ciclopirox  (LOPROX ) 0.77 % cream Apply topically 2 (two) times daily. 07/19/24   Frann Mabel Mt, DO  cyanocobalamin  1000 MCG tablet Take 1,000 mcg by mouth daily.    [provider]  diltiazem  (CARDIZEM  CD) 120 MG 24 hr capsule Take 1 capsule (120 mg total) by mouth daily. 07/28/24   Aniceto Daphne CROME, NP  fluticasone  (FLONASE ) 50 MCG/ACT nasal spray Place 2 sprays into both nostrils daily. Patient taking differently: Place 2 sprays into both nostrils as needed. 03/19/23   Frann Mabel Mt, DO  Multiple Vitamins-Minerals (PRESERVISION AREDS) CAPS Take 1 capsule by mouth daily. 06/21/20   [provider]  pantoprazole  (PROTONIX ) 40 MG tablet Take 1 tablet (40 mg total) by mouth daily as needed. 06/30/24   Frann Mabel Mt, DO  traMADol  (ULTRAM ) 50 MG tablet Take 1 tablet (50 mg total) by mouth every 12 (twelve) hours as needed. 04/05/24   Frann Mabel Mt, DO    Allergies: Amitriptyline, Codeine phosphate, Pregabalin, Tamsulosin , Tape, and Lexapro  [escitalopram  oxalate]  Review of Systems  Constitutional:  Negative for activity change, appetite change, fatigue and fever.  HENT:  Negative for congestion and rhinorrhea.   Respiratory:  Negative for cough, chest tightness and shortness of breath.   Cardiovascular:  Negative for chest pain.  Gastrointestinal:  Positive for abdominal pain, constipation and nausea. Negative for vomiting.  Genitourinary:  Negative for dysuria and hematuria.  Musculoskeletal:  Positive for arthralgias and myalgias.  Skin:  Negative for rash.  Neurological:  Negative for  dizziness, weakness and headaches.   all other systems are negative except as noted in the HPI and PMH.    Updated Vital Signs BP (!) 159/78   Pulse 79   Temp 98.1 F (36.7 C)   Resp 17   SpO2 98%   Physical Exam Vitals and nursing note reviewed.  Constitutional:      General: He is not in acute distress.    Appearance: He is well-developed.  HENT:     Head: Normocephalic and atraumatic.     Mouth/Throat:     Pharynx: No oropharyngeal exudate.  Eyes:     Conjunctiva/sclera: Conjunctivae normal.     Pupils: Pupils are equal, round, and reactive to light.  Neck:     Comments: No meningismus. Cardiovascular:     Rate and Rhythm: Normal rate and regular rhythm.     Heart sounds: Normal heart sounds. No murmur heard. Pulmonary:     Effort: Pulmonary effort is normal. No respiratory distress.     Breath sounds: Normal breath sounds.  Abdominal:     Palpations: Abdomen is soft.     Tenderness: There is abdominal tenderness. There is no guarding or rebound.     Comments: Periumbilical tenderness, equal femoral pulses  Genitourinary:    Comments: Enlarged prostate felt but no palpable stool.  No fecal impaction.  No gross blood. Musculoskeletal:        General: No tenderness. Normal range of motion.     Cervical back: Normal range of motion and neck supple.     Right lower leg: Edema present.     Left lower leg: Edema present.     Comments: Left greater than right lower extremity edema.  Intact DP and PT pulses.  Skin:    General: Skin is warm.  Neurological:     Mental Status: He is alert and oriented to person, place, and time.     Cranial Nerves: No cranial nerve deficit.     Motor: No abnormal muscle tone.     Coordination: Coordination normal.     Comments:  5/5 strength throughout. CN 2-12 intact.Equal grip strength.   Psychiatric:        Behavior: Behavior normal.     (all labs ordered are listed, but only abnormal results are displayed) Labs Reviewed   COMPREHENSIVE METABOLIC PANEL WITH GFR - Abnormal; Notable for the following components:      Result Value   Glucose, Bld 104 (*)    Creatinine, Ser 1.57 (*)    GFR, Estimated 43 (*)    All other components within normal limits  URINALYSIS, ROUTINE W REFLEX MICROSCOPIC - Abnormal; Notable for the following components:   Color, Urine STRAW (*)    All other components within normal limits  PRO BRAIN NATRIURETIC PEPTIDE - Abnormal; Notable for the following components:   Pro Brain Natriuretic Peptide 341.0 (*)    All other components within normal limits  TROPONIN T, HIGH SENSITIVITY - Abnormal; Notable for the following components:  Troponin T High Sensitivity 48 (*)    All other components within normal limits  TROPONIN T, HIGH SENSITIVITY - Abnormal; Notable for the following components:   Troponin T High Sensitivity 46 (*)    All other components within normal limits  CBC WITH DIFFERENTIAL/PLATELET  LIPASE, BLOOD  POC OCCULT BLOOD, ED    EKG: EKG Interpretation Date/Time:  Monday August 09 2024 05:25:30 EDT Ventricular Rate:  69 PR Interval:  235 QRS Duration:  88 QT Interval:  388 QTC Calculation: 416 R Axis:   33  Text Interpretation: Sinus rhythm Ventricular premature complex Prolonged PR interval Low voltage, precordial leads Probable anteroseptal infarct, old Nonspecific T wave abnormality Confirmed by Carita Senior [phone]) on 08/09/2024 5:37:20 AM  Radiology: CT ABDOMEN PELVIS W CONTRAST Result Date: 08/09/2024 EXAM: CT ABDOMEN AND PELVIS WITH CONTRAST 08/09/2024 05:56:23 AM TECHNIQUE: CT of the abdomen and pelvis was performed with the administration of 75 mL of iohexol  (OMNIPAQUE ) 300 MG/ML solution. Multiplanar reformatted images are provided for review. Automated exposure control, iterative reconstruction, and/or weight-based adjustment of the mA/kV was utilized to reduce the radiation dose to as low as reasonably achievable. COMPARISON: 11/11/2021 CLINICAL  HISTORY: Abdominal pain, acute, nonlocalized. FINDINGS: LOWER CHEST: Unchanged 3 mm nodule within the periphery of the left lower lobe, image 46/6. This is compatible with a benign nodule requiring no further follow up. LIVER: The liver is unremarkable. GALLBLADDER AND BILE DUCTS: Gallbladder is unremarkable. No biliary ductal dilatation. SPLEEN: No acute abnormality. PANCREAS: No acute abnormality. ADRENAL GLANDS: No acute abnormality. KIDNEYS, URETERS AND BLADDER: Bilateral kidney cysts. The largest arises off the posterior cortex of the right kidney measuring 4.1 cm. These are compatible with Bosniak class 1 and 2 kidney cysts. No follow up imaging recommended. Per consensus, no follow-up is needed for simple Bosniak type 1 and 2 renal cysts, unless the patient has a malignancy history or risk factors. No stones in the kidneys or ureters. No hydronephrosis. No perinephric or periureteral stranding. Urinary bladder is unremarkable. GI AND BOWEL: Stomach demonstrates no acute abnormality. There is a right inguinal hernia containing a knuckle of small bowel within the proximal right inguinal canal, image 88/2. There is mild increased caliber of the proximal small bowel loops within the right hemiabdomen leading up to the hernia, measuring 2 cm in maximum diameter, with transition to decreased caliber small bowel noted distally. The appendix is visualized and appears normal. Sigmoid diverticulosis without signs of acute diverticulitis. PERITONEUM AND RETROPERITONEUM: No free fluid or fluid collections. No signs of pneumoperitoneum. No ascites. No free air. VASCULATURE: Aortic atherosclerosis. Aorta is normal in caliber. LYMPH NODES: No lymphadenopathy. REPRODUCTIVE ORGANS: Prostate gland enlargement. BONES AND SOFT TISSUES: Visualized osseous structures are unremarkable. Dorsal column stimulator is identified with lead terminating in the lower thoracic canal. Small fat-containing umbilical hernia and moderate to  large fat-containing left inguinal hernia. No acute osseous abnormality. No focal soft tissue abnormality. IMPRESSION: 1. Moderate right inguinal hernia containing a knuckle of small bowel with mild proximal small-bowel dilation (up to 2 cm) and transition to decreased caliber distally; no sings of high grade bowel obstruction or inflammation. 2. Sigmoid diverticulosis without acute diverticulitis. 3. Aortic atherosclerotic calcification. Electronically signed by: Waddell Calk MD 08/09/2024 06:22 AM EDT RP Workstation: [id number]     Procedures   Medications Ordered in the ED  ondansetron  (ZOFRAN ) injection 4 mg (has no administration in time range)  Medical Decision Making Amount and/or Complexity of Data Reviewed Independent Historian: spouse Labs: ordered. Decision-making details documented in ED Course. Radiology: ordered and independent interpretation performed. Decision-making details documented in ED Course. ECG/medicine tests: ordered and independent interpretation performed. Decision-making details documented in ED Course.  Risk Prescription drug management.   Abdominal pain with constipation.  Stable vitals.  No distress.  Abdomen soft without peritoneal signs.  Intact distal pulses.  Does follow with gastroenterology for chronic constipation.  Labs reassuring without significant leukocytosis.  Creatinine at baseline.  Urinalysis negative. Troponin minimally elevated at 48 but no chest pain or shortness of breath.  Will trend.  CT scan as above does not show significant constipation but does show right inguinal hernia with possible bowel obstruction.  This is not obviously appreciated on physical exam.  Discussed with Dr. Tanda of general surgery who will evaluate later this morning.  Will also obtain ultrasound of his legs to rule out DVT given increased swelling. Repeat troponin pending.   Dr. Zammit to assume care pending surgical  evaluation.      Final diagnoses:  None    ED Discharge Orders     None          Hansel Devan, Garnette, MD 08/09/24 [phone]

## 2024-08-09 NOTE — ED Triage Notes (Signed)
 Patient c/o constipation x 5 days. Patient report he tried milk of magnesia twice in 2 days and fleet enema yesterday without relief. Patient 7/10 hip pain. Patient denies N/V.

## 2024-08-09 NOTE — Discharge Instructions (Addendum)
 Follow-up with your gastroenterologist to get your colonoscopy done.  And you have been referred to general [place] [facility] if you want your hernias fixed.  The Doppler study did show a blood clot in the peroneal left deep vein.  We have started you on Xarelto first dose here tonight.  And then you given a prescription for a starter pack to continue.  Make an appointment to follow-up with your primary care doctor.  Keep your appointment and follow-up with general surgery.  Also would contact your gastroenterologist regarding your colonoscopy at the end of the month.  Return for any new or worse symptoms like increased shortness of breath or chest pain.

## 2024-08-09 NOTE — ED Provider Notes (Signed)
 Patient seen by general surgery for his inguinal hernias.  General surgery states that the hernias can be repaired as an outpatient procedure and patient will follow-up with [facility].  He will also follow-up with his GI doctor for constipation and to get his colonoscopy.   Suzette Pac, MD 08/09/24 [phone]

## 2024-08-10 ENCOUNTER — Other Ambulatory Visit (HOSPITAL_BASED_OUTPATIENT_CLINIC_OR_DEPARTMENT_OTHER): Payer: Self-pay

## 2024-08-10 ENCOUNTER — Telehealth: Payer: Self-pay | Admitting: Gastroenterology

## 2024-08-10 LAB — URINE CULTURE: Culture: <10000 — AB

## 2024-08-10 MED ORDER — XARELTO VTE STARTER PACK 15 & 20 MG PO TBPK
ORAL_TABLET | ORAL | 0 refills | Status: DC
  Filled 2024-08-10: qty 51, 30d supply, fill #0

## 2024-08-10 NOTE — Telephone Encounter (Signed)
 Patient calling to speak about changes in his medication. Please advise.

## 2024-08-10 NOTE — Telephone Encounter (Signed)
 Patient is scheduled on 08-30-24 for an endoscopy and colonoscopy. He was diagnosed on 08-09-24 with DVT in the left leg and started on Xarelto.  Patient advised to follow up with PCP regarding treatment plan.  He does not have an appointment scheduled at this time.  Would you like me to cancel his appointment for procedures at this time until he sees PCP for management of Xarelto?

## 2024-08-11 ENCOUNTER — Encounter: Payer: Self-pay | Admitting: Family Medicine

## 2024-08-12 NOTE — Telephone Encounter (Signed)
 Patient aware that referral to [facility] for right inguinal hernia was placed.  Patient advised that [facility] should contact him with an appointment, and if he has not heard from them in 1 to 2 weeks he should contact our office to make us  aware.  Patient also has complaints of continued issues with constipation.  Patient stated that he is only having a BM every 4 days and using milk of magnesia to produce BM. He stated that he has also been using enemas as well.  Patient would like your recommendations on what he should do to help with constipation.

## 2024-08-13 ENCOUNTER — Encounter: Payer: Self-pay | Admitting: Pharmacist Clinician (PhC)/ Clinical Pharmacy Specialist

## 2024-08-13 ENCOUNTER — Ambulatory Visit: Admitting: Pharmacist Clinician (PhC)/ Clinical Pharmacy Specialist

## 2024-08-13 VITALS — BP 175/77 | HR 78

## 2024-08-13 DIAGNOSIS — E785 Hyperlipidemia, unspecified: Secondary | ICD-10-CM

## 2024-08-13 DIAGNOSIS — I10 Essential (primary) hypertension: Secondary | ICD-10-CM | POA: Diagnosis not present

## 2024-08-13 MED ORDER — METOPROLOL SUCCINATE ER 25 MG PO TB24: 25.0000 mg | ORAL_TABLET | Freq: Every day | ORAL | 6 refills | Status: DC

## 2024-08-13 MED ORDER — ATORVASTATIN CALCIUM 20 MG PO TABS: 20.0000 mg | ORAL_TABLET | Freq: Every day | ORAL | 3 refills | Status: AC

## 2024-08-13 NOTE — Patient Instructions (Addendum)
 Your Results:             Your most recent labs Goal  Total Cholesterol 126 < 200  Triglycerides 89 < 150  HDL (happy/good cholesterol) 33.8 > 40  LDL (lousy/bad cholesterol 89 < 70   Medication changes:  Increase atorvastatin  to 20 mg once daily.  If you have any problems with this, please stop for a week and decrease back to 10 mg daily.    Re-start the metoprolol  25 mg once daily in the evenings.    Lab orders:  We want to repeat labs after 2-3 months.  We will send you a lab order to remind you once we get closer to that time.     Thank you for choosing [facility]

## 2024-08-13 NOTE — Telephone Encounter (Signed)
 Patient advised that he should complete bowel purge, then start Miralax daily.  Instructions for bowel purge sent to patient in MyChart.  Patient agreed to plan and verbalized understanding.  No further questions.

## 2024-08-13 NOTE — Progress Notes (Signed)
 Office Visit    Patient Name: Alex Dunn Date of Encounter: 08/19/2024  Primary Care Provider:  Frann Mabel Mt, DO Primary Cardiologist:  None  Chief Complaint    Hyperlipidemia   Significant Past Medical History   HTN PCP d/c'd meds, complaints of dizziness, consider < 140/80  TIA 5/24, old lacunar infarct noted on CTA, pt reported CVA 2020  CKD Stage 3 GFR   PVC's 15.7% burden on monitor, currently no tx  DVT  10/25 (had Covid in August) - provoked?  Raynaud's      Allergies  Allergen Reactions   Amitriptyline Other (See Comments)    SLEPT FOR 18-19 HOURS STRAIGHT   Codeine Phosphate Other (See Comments)    Seen odd things crawling up wall   Pregabalin Other (See Comments)    Drowsy, nightmares, stumbling   Tamsulosin  Other (See Comments)    Faint feeling    Tape Other (See Comments)    Skin irritation   Lexapro  [Escitalopram  Oxalate] Other (See Comments)    shaking    History of Present Illness    Alex Dunn is a 88 y.o. male patient of Dr Waddell, in the office today to discuss options for cholesterol management.  His most recent LDL was 89, with a goal of < 70.  Currently on atorvastatin  10 mg daily, without any issue.  He also had some concerns about his BP being elevated since switching from metoprolol  to diltiazem .    Insurance Carrier: [employer] [phone]  LDL Cholesterol goal:  LDL < 70  Current Medications:  diltiazem  120 mg daily   Previously tried: most medications seem to worsen lightheadedness - indapamide , ARB, hctz, metoprolol  (fatigue?)  Family Hx: father had MI and passed at an early age, brother had MI    Social Hx: Tobacco: no Alcohol: no  Accessory Clinical Findings   Lab Results  Component Value Date   CHOL 126 01/26/2024   HDL 33.80 (L) 01/26/2024   LDLCALC 74 01/26/2024   TRIG 89.0 01/26/2024   CHOLHDL 4 01/26/2024    No results found for: LIPOA  Lab Results  Component Value Date   ALT 17  08/16/2024   AST 20 08/16/2024   ALKPHOS 127 (H) 08/16/2024   BILITOT 0.5 08/16/2024   Lab Results  Component Value Date   CREATININE 1.76 (H) 08/16/2024   BUN 25 (H) 08/16/2024   NA 136 08/16/2024   K 4.4 08/16/2024   CL 102 08/16/2024   CO2 25 08/16/2024   Lab Results  Component Value Date   HGBA1C 5.9 (H) 03/09/2023    Home Medications    Current Outpatient Medications  Medication Sig Dispense Refill   acetaminophen  (TYLENOL ) 325 MG tablet Take 650 mg by mouth 4 (four) times daily as needed for mild pain or moderate pain (pain).     aspirin  EC 81 MG tablet Take 81 mg by mouth daily. Swallow whole.     atorvastatin  (LIPITOR) 20 MG tablet Take 1 tablet (20 mg total) by mouth daily. 90 tablet 3   Cholecalciferol (VITAMIN D ) 50 MCG (2000 UT) CAPS Take 2,000 Units by mouth daily at 6 (six) AM.     ciclopirox  (LOPROX ) 0.77 % cream Apply topically 2 (two) times daily. 15 g 0   cyanocobalamin  1000 MCG tablet Take 1,000 mcg by mouth daily.     diltiazem  (CARDIZEM  CD) 120 MG 24 hr capsule Take 1 capsule (120 mg total) by mouth daily. 90 capsule 3  fluticasone  (FLONASE ) 50 MCG/ACT nasal spray Place 2 sprays into both nostrils daily. 16 g 6   metoprolol  succinate (TOPROL  XL) 25 MG 24 hr tablet Take 1 tablet (25 mg total) by mouth daily. 30 tablet 6   Multiple Vitamins-Minerals (PRESERVISION AREDS) CAPS Take 1 capsule by mouth daily.     pantoprazole  (PROTONIX ) 40 MG tablet Take 1 tablet (40 mg total) by mouth daily as needed. 90 tablet 0   Rivaroxaban Starter Pack, 15 mg and 20 mg, (XARELTO STARTER PACK) Take one 15 mg tablet by mouth twice a day for 21 days.  On day 22, switch to one 20 mg tablet once a day.  Take with food. 51 each 0   traMADol  (ULTRAM ) 50 MG tablet Take 1 tablet (50 mg total) by mouth every 12 (twelve) hours as needed. 90 tablet 1   No current facility-administered medications for this visit.     Assessment & Plan    Dyslipidemia Assessment: Patient with ASCVD  not at LDL goal of < 70 Most recent LDL 89 on 01/26/24 Has been compliant with moderate intensity statin : atorvastatin  10 mg  Plan: Patient agreeable to increasing atorvastatin  to 20 mg daily Repeat labs after:  2-3 months Lipid Liver function    Essential hypertension Will re-start metoprolol , along with diltiazem .  Patient to monitor home BP readings.     Myracle Febres, PharmD CPP [facility] [address]   [place], [place] [address] [phone]  08/19/2024, 4:39 PM

## 2024-08-15 NOTE — Progress Notes (Signed)
 This encounter was created in error - please disregard.

## 2024-08-16 ENCOUNTER — Inpatient Hospital Stay: Attending: Hematology & Oncology

## 2024-08-16 ENCOUNTER — Encounter: Payer: Self-pay | Admitting: Hematology & Oncology

## 2024-08-16 ENCOUNTER — Inpatient Hospital Stay (HOSPITAL_BASED_OUTPATIENT_CLINIC_OR_DEPARTMENT_OTHER): Admitting: Hematology & Oncology

## 2024-08-16 ENCOUNTER — Other Ambulatory Visit: Payer: Self-pay

## 2024-08-16 VITALS — BP 137/59 | HR 66 | Temp 98.4°F | Resp 18

## 2024-08-16 DIAGNOSIS — I82452 Acute embolism and thrombosis of left peroneal vein: Secondary | ICD-10-CM | POA: Insufficient documentation

## 2024-08-16 DIAGNOSIS — D649 Anemia, unspecified: Secondary | ICD-10-CM | POA: Insufficient documentation

## 2024-08-16 DIAGNOSIS — Z79899 Other long term (current) drug therapy: Secondary | ICD-10-CM | POA: Insufficient documentation

## 2024-08-16 DIAGNOSIS — D472 Monoclonal gammopathy: Secondary | ICD-10-CM | POA: Diagnosis not present

## 2024-08-16 DIAGNOSIS — Z7901 Long term (current) use of anticoagulants: Secondary | ICD-10-CM | POA: Diagnosis not present

## 2024-08-16 LAB — CMP (CANCER CENTER ONLY)
ALT: 17 U/L (ref 0–44)
AST: 20 U/L (ref 15–41)
Albumin: 3.9 g/dL (ref 3.5–5.0)
Alkaline Phosphatase: 127 U/L — ABNORMAL HIGH (ref 38–126)
Anion gap: 10 (ref 5–15)
BUN: 25 mg/dL — ABNORMAL HIGH (ref 8–23)
CO2: 25 mmol/L (ref 22–32)
Calcium: 8.9 mg/dL (ref 8.9–10.3)
Chloride: 102 mmol/L (ref 98–111)
Creatinine: 1.76 mg/dL — ABNORMAL HIGH (ref 0.61–1.24)
GFR, Estimated: 37 mL/min — ABNORMAL LOW (ref >60)
Glucose, Bld: 126 mg/dL — ABNORMAL HIGH (ref 70–99)
Potassium: 4.4 mmol/L (ref 3.5–5.1)
Sodium: 136 mmol/L (ref 135–145)
Total Bilirubin: 0.5 mg/dL (ref 0.0–1.2)
Total Protein: 6.6 g/dL (ref 6.5–8.1)

## 2024-08-16 LAB — RETIC PANEL
Immature Retic Fract: 11.4 % (ref 2.3–15.9)
RBC.: 4.12 MIL/uL — ABNORMAL LOW (ref 4.22–5.81)
Retic Count, Absolute: 33.4 K/uL (ref 19.0–186.0)
Retic Ct Pct: 0.8 % (ref 0.4–3.1)
Reticulocyte Hemoglobin: 35.5 pg (ref >27.9)

## 2024-08-16 LAB — CBC WITH DIFFERENTIAL (CANCER CENTER ONLY)
Abs Immature Granulocytes: 0.03 K/uL (ref 0.00–0.07)
Basophils Absolute: 0 K/uL (ref 0.0–0.1)
Basophils Relative: 1 %
Eosinophils Absolute: 0.2 K/uL (ref 0.0–0.5)
Eosinophils Relative: 3 %
HCT: 40.6 % (ref 39.0–52.0)
Hemoglobin: 13.3 g/dL (ref 13.0–17.0)
Immature Granulocytes: 0 %
Lymphocytes Relative: 24 %
Lymphs Abs: 1.7 K/uL (ref 0.7–4.0)
MCH: 32 pg (ref 26.0–34.0)
MCHC: 32.8 g/dL (ref 30.0–36.0)
MCV: 97.6 fL (ref 80.0–100.0)
Monocytes Absolute: 0.5 K/uL (ref 0.1–1.0)
Monocytes Relative: 7 %
Neutro Abs: 4.7 K/uL (ref 1.7–7.7)
Neutrophils Relative %: 65 %
Platelet Count: 190 K/uL (ref 150–400)
RBC: 4.16 MIL/uL — ABNORMAL LOW (ref 4.22–5.81)
RDW: 13.1 % (ref 11.5–15.5)
WBC Count: 7.1 K/uL (ref 4.0–10.5)
nRBC: 0 % (ref 0.0–0.2)

## 2024-08-16 LAB — FOLATE: Folate: 18.7 ng/mL (ref >5.9)

## 2024-08-16 LAB — IRON AND IRON BINDING CAPACITY (CC-WL,HP ONLY)
Iron: 96 ug/dL (ref 45–182)
Saturation Ratios: 34 % (ref 17.9–39.5)
TIBC: 286 ug/dL (ref 250–450)
UIBC: 190 ug/dL

## 2024-08-16 LAB — LACTATE DEHYDROGENASE: LDH: 155 U/L (ref 98–192)

## 2024-08-16 LAB — SAVE SMEAR(SSMR), FOR PROVIDER SLIDE REVIEW

## 2024-08-16 LAB — FERRITIN: Ferritin: 109 ng/mL (ref 24–336)

## 2024-08-16 LAB — VITAMIN B12: Vitamin B-12: 622 pg/mL (ref 180–914)

## 2024-08-16 NOTE — Progress Notes (Signed)
 Hematology and Oncology Follow Up Visit  Alex Dunn [id number] 1935-11-13 88 y.o. 08/16/2024   Principle Diagnosis:  IgA kappa MGUS Thromboembolic disease of left peroneal vein    Current Therapy:        Xarelto 20 mg p.o. daily   Interim History:  Alex Dunn is here today for follow-up. He is here with his wife.  He is still not doing all that well.  He is quite fatigued.  He does not have a lot of energy.  He is having some sweats.  He says that he wakes up at night and just feels like he has a flu.  He recently was found to have a blood clot in the left leg.  This happened after COVID.  Apparently he went to the emergency room.  He was not feeling all that well.  He had some swelling in his legs.  He had Doppler of his legs.  This is on 08/09/2024.  This showed a thrombus in the left peroneal vein.    He did have a CT of the abdomen and pelvis.  This was done on 08/09/2024.  This also was unremarkable.  Again, he is not feeling well.  He just has fatigue.  He is losing weight.  He is not eating well.  I suppose this all might be this MGUS which could be progressing.  When has bone marrow biopsy done back 6 years ago, I think he had about 10% plasma cells in the bone marrow.  When he was last here, there was no monoclonal spike in his blood.  His IgA level was 441 mg/dL.  His kappa light chain was 5.9 mg/dL.  Again, I really do not think we have any other option at this point.  I think going to have to do a bone marrow biopsy on him.  He is going have a 24-hour urine done.  We will also see about getting a PET scan on him to see how this looks.  Of note, he does have positive rheumatologic studies.  I am not sure exactly how significant these are.  Currently, I would say that his performance status is probably ECOG 2.  1.   Wt Readings from Last 3 Encounters:  07/26/24 180 lb (81.6 kg)  07/19/24 180 lb 6.4 oz (81.8 kg)  07/14/24 182 lb (82.6 kg)     Medications:   Allergies as of 08/16/2024       Reactions   Amitriptyline Other (See Comments)   SLEPT FOR 18-19 HOURS STRAIGHT   Codeine Phosphate Other (See Comments)   Seen odd things crawling up wall   Pregabalin Other (See Comments)   Drowsy, nightmares, stumbling   Tamsulosin  Other (See Comments)   Faint feeling   Tape Other (See Comments)   Skin irritation   Lexapro  [escitalopram  Oxalate] Other (See Comments)   shaking        Medication List        Accurate as of August 16, 2024  2:11 PM. If you have any questions, ask your nurse or doctor.          acetaminophen  325 MG tablet Commonly known as: TYLENOL  Take 650 mg by mouth 4 (four) times daily as needed for mild pain or moderate pain (pain).   aspirin  EC 81 MG tablet Take 81 mg by mouth daily. Swallow whole.   atorvastatin  20 MG tablet Commonly known as: LIPITOR Take 1 tablet (20 mg total) by mouth daily.   ciclopirox  0.77 %  cream Commonly known as: LOPROX  Apply topically 2 (two) times daily.   cyanocobalamin  1000 MCG tablet Take 1,000 mcg by mouth daily.   diltiazem  120 MG 24 hr capsule Commonly known as: CARDIZEM  CD Take 1 capsule (120 mg total) by mouth daily.   fluticasone  50 MCG/ACT nasal spray Commonly known as: FLONASE  Place 2 sprays into both nostrils daily.   metoprolol  succinate 25 MG 24 hr tablet Commonly known as: Toprol  XL Take 1 tablet (25 mg total) by mouth daily.   pantoprazole  40 MG tablet Commonly known as: PROTONIX  Take 1 tablet (40 mg total) by mouth daily as needed.   PreserVision AREDS Caps Take 1 capsule by mouth daily.   traMADol  50 MG tablet Commonly known as: ULTRAM  Take 1 tablet (50 mg total) by mouth every 12 (twelve) hours as needed.   Vitamin D  50 MCG (2000 UT) Caps Take 2,000 Units by mouth daily at 6 (six) AM.   Xarelto Starter Pack Generic drug: Rivaroxaban Starter Pack (15 mg and 20 mg) Take one 15 mg tablet by mouth twice a day for 21 days.  On day 22, switch to  one 20 mg tablet once a day.  Take with food. What changed: Another medication with the same name was removed. Continue taking this medication, and follow the directions you see here. Changed by: Maude JONELLE Crease        Allergies:  Allergies  Allergen Reactions   Amitriptyline Other (See Comments)    SLEPT FOR 18-19 HOURS STRAIGHT   Codeine Phosphate Other (See Comments)    Seen odd things crawling up wall   Pregabalin Other (See Comments)    Drowsy, nightmares, stumbling   Tamsulosin  Other (See Comments)    Faint feeling    Tape Other (See Comments)    Skin irritation   Lexapro  [Escitalopram  Oxalate] Other (See Comments)    shaking    Past Medical History, Surgical history, Social history, and Family History were reviewed and updated.  Review of Systems: Review of Systems  Constitutional: Negative.   HENT: Negative.    Eyes: Negative.   Respiratory: Negative.    Cardiovascular: Negative.   Gastrointestinal: Negative.   Genitourinary: Negative.   Musculoskeletal: Negative.   Skin: Negative.   Neurological:  Positive for tingling.  Endo/Heme/Allergies: Negative.   Psychiatric/Behavioral: Negative.        Physical Exam:  Vital signs show temperature of 98.4.  Pulse 66.  Blood pressure 137/59.  Weight is 180 pounds.  Wt Readings from Last 3 Encounters:  07/26/24 180 lb (81.6 kg)  07/19/24 180 lb 6.4 oz (81.8 kg)  07/14/24 182 lb (82.6 kg)   Physical Exam Vitals reviewed.  HENT:     Head: Normocephalic and atraumatic.  Eyes:     Pupils: Pupils are equal, round, and reactive to light.  Cardiovascular:     Rate and Rhythm: Normal rate and regular rhythm.     Heart sounds: Normal heart sounds.  Pulmonary:     Effort: Pulmonary effort is normal.     Breath sounds: Normal breath sounds.  Abdominal:     General: Bowel sounds are normal.     Palpations: Abdomen is soft.  Musculoskeletal:        General: No tenderness or deformity. Normal range of motion.      Cervical back: Normal range of motion.  Lymphadenopathy:     Cervical: No cervical adenopathy.  Skin:    General: Skin is warm and dry.  Findings: No erythema or rash.  Neurological:     Mental Status: He is alert and oriented to person, place, and time.  Psychiatric:        Behavior: Behavior normal.        Thought Content: Thought content normal.        Judgment: Judgment normal.      Lab Results  Component Value Date   WBC 7.1 08/16/2024   HGB 13.3 08/16/2024   HCT 40.6 08/16/2024   MCV 97.6 08/16/2024   PLT 190 08/16/2024   Lab Results  Component Value Date   FERRITIN 120 07/14/2024   IRON 71 07/14/2024   TIBC 314 07/14/2024   UIBC 243 07/14/2024   IRONPCTSAT 23 07/14/2024   Lab Results  Component Value Date   RETICCTPCT 0.8 08/16/2024   RBC 4.12 (L) 08/16/2024   Lab Results  Component Value Date   KPAFRELGTCHN 59.2 (H) 07/14/2024   LAMBDASER 30.5 (H) 07/14/2024   KAPLAMBRATIO 1.94 (H) 07/14/2024   Lab Results  Component Value Date   IGGSERUM 873 07/14/2024   IGA 441 (H) 07/14/2024   IGMSERUM 50 07/14/2024   Lab Results  Component Value Date   TOTALPROTELP 5.9 (L) 07/14/2024   ALBUMINELP 2.9 07/14/2024   A1GS 0.3 07/14/2024   A2GS 0.8 07/14/2024   BETS 1.2 07/14/2024   BETA2SER 0.5 06/28/2024   GAMS 0.7 07/14/2024   MSPIKE Not Observed 07/14/2024   SPEI Comment 07/14/2024     Chemistry      Component Value Date/Time   NA 140 08/09/2024 0456   NA 142 07/01/2019 1154   K 4.3 08/09/2024 0456   CL 106 08/09/2024 0456   CO2 22 08/09/2024 0456   BUN 19 08/09/2024 0456   BUN 25 07/01/2019 1154   CREATININE 1.57 (H) 08/09/2024 0456   CREATININE 2.05 (H) 07/14/2024 1453   CREATININE 1.61 (H) 02/26/2019 1511      Component Value Date/Time   CALCIUM  9.4 08/09/2024 0456   ALKPHOS 125 08/09/2024 0456   AST 23 08/09/2024 0456   AST 19 07/14/2024 1453   ALT 19 08/09/2024 0456   ALT 17 07/14/2024 1453   BILITOT 0.7 08/09/2024 0456   BILITOT  0.5 07/14/2024 1453       Impression and Plan: Mr. Hoefling is a very pleasant 88 y.o. caucasian gentleman with IgA kappa MGUS.   Again, it is not clear at all as to what the real problem here is.  However, given that he has his monoclonal dermopathy, I am sure that people will think that this is going to be his issue.  Again, we are going to have to do a bone marrow biopsy on him so that we can see if there is any progression of his disease.  I talked to he and his wife about this.  We will have to see about getting 1 set up for him.  I will see about getting 1 set up in about 3 weeks.  I will also get a PET scan on him..  We will see about a 24-hour urine.  I told him to make sure he drinks a lot of fluid while doing the 24-hour urine.  I think this would be very helpful.  When we see him back in about 6 weeks, then we will have a very good idea as to what is going on.   Ct biop    Maude JONELLE Crease, MD 10/13/20252:11 PM

## 2024-08-17 LAB — KAPPA/LAMBDA LIGHT CHAINS
Kappa free light chain: 53.9 mg/L — ABNORMAL HIGH (ref 3.3–19.4)
Kappa, lambda light chain ratio: 1.81 — ABNORMAL HIGH (ref 0.26–1.65)
Lambda free light chains: 29.8 mg/L — ABNORMAL HIGH (ref 5.7–26.3)

## 2024-08-17 NOTE — Telephone Encounter (Signed)
 Appointment with cardiology was cancelled as the patient is no longer scheduled for colonoscopy due to recent DVT.

## 2024-08-18 ENCOUNTER — Ambulatory Visit: Admitting: Family Medicine

## 2024-08-18 ENCOUNTER — Encounter: Payer: Self-pay | Admitting: Family Medicine

## 2024-08-18 VITALS — BP 138/74 | HR 70 | Temp 98.0°F | Resp 16 | Ht 73.0 in | Wt 181.6 lb

## 2024-08-18 DIAGNOSIS — M5416 Radiculopathy, lumbar region: Secondary | ICD-10-CM

## 2024-08-18 DIAGNOSIS — I10 Essential (primary) hypertension: Secondary | ICD-10-CM

## 2024-08-18 DIAGNOSIS — Z79899 Other long term (current) drug therapy: Secondary | ICD-10-CM

## 2024-08-18 DIAGNOSIS — N1832 Chronic kidney disease, stage 3b: Secondary | ICD-10-CM | POA: Diagnosis not present

## 2024-08-18 DIAGNOSIS — Z23 Encounter for immunization: Secondary | ICD-10-CM

## 2024-08-18 NOTE — Patient Instructions (Addendum)
 Let me know if Dr. Jessy team is tough to get ahold of to refill the Xarelto and I will take care of it.   Keep the diet clean and stay active.  No med changes for the blood pressure.  Foods that may reduce pain: 1) Ginger 2) Blueberries 3) Salmon 4) Pumpkin seeds 5) Dark chocolate 6) Turmeric 7) Tart cherries 8) Virgin olive oil 9) Chili peppers 10) Mint 11) Krill oil  Let us  know if you need anything.

## 2024-08-18 NOTE — Progress Notes (Signed)
 Chief Complaint  Patient presents with   Follow-up    Follow Up     Subjective Alex Dunn is a 87 y.o. male who presents for hypertension follow up.  He is here with his spouse. He does monitor home blood pressures. Blood pressures ranging from 130-140's/80's on average. He is compliant with medications- Toprol  XL 50 mg/d, Cardizem  120 mg daily. Patient has these side effects of medication: none He is usually adhering to a healthy diet overall. Current exercise: none No CP or SOB.   He also has a history of chronic low back pain and lower extremity neuropathy.  He takes tramadol  50 mg daily as needed.  He usually takes it at night now.  No adverse effects.  It does help with his pain.   Past Medical History:  Diagnosis Date   Abdominal aortic atherosclerosis 04/15/2018   ANEMIA-IRON DEFICIENCY 06/23/2007   B12 deficiency 06/13/2020   BPH (benign prostatic hyperplasia) 06/23/2007   Qualifier: Diagnosis of  By: Nunzio, RN, Dagoberto Caldron   Formatting of this note might be different from the original. Formatting of this note might be different from the original. Qualifier: Diagnosis of  By: Nunzio, RN, Dagoberto Caldron   Degeneration of lumbar intervertebral disc 06/30/2018   Diverticulosis    Elevated diaphragm 04/15/2018   Essential hypertension 04/15/2018   Gastro-esophageal reflux disease without esophagitis 06/23/2007   Qualifier: Diagnosis of  By: Nunzio, RN, Dagoberto Caldron   Formatting of this note might be different from the original. Formatting of this note might be different from the original. Qualifier: Diagnosis of  By: Nunzio, RN, Dagoberto Caldron   Lumbar radiculopathy 05/06/2019   Lumbosacral spondylosis without myelopathy 03/10/2019   Monoclonal gammopathy of unknown significance (MGUS) 08/24/2018   Multiple renal cysts 04/06/2018   Nonsustained ventricular tachycardia (HCC) 11/03/2019   Primary osteoarthritis of both knees 06/30/2018   Bilateral moderate with moderate chondromalacia patella  Formatting of this note might be different from the original. Formatting of this note might be different from the original. Bilateral moderate with moderate chondromalacia patella   Stage 3b chronic kidney disease (HCC) 06/21/2020    Exam BP 138/74 (BP Location: Left Arm, Patient Position: Sitting)   Pulse 70   Temp 98 F (36.7 C) (Oral)   Resp 16   Ht 6' 1 (1.854 m)   Wt 181 lb 9.6 oz (82.4 kg)   SpO2 95%   BMI 23.96 kg/m  General:  well developed, well nourished, in no apparent distress Heart: RRR, no bruits Lungs: clear to auscultation, no accessory muscle use Psych: well oriented with normal range of affect and appropriate judgment/insight  Essential hypertension  Lumbar radiculopathy  High risk medication use - Plan: Drug Monitoring Panel [phone] , Urine  Need for influenza vaccination - Plan: Flu vaccine HIGH DOSE PF(Fluzone Trivalent)  Chronic, now stable.  Continue Toprol -XL 50 mg daily, Cardizem  120 mg daily.  Counseled on diet and exercise. Chronic, stable.  Continue tramadol  50 mg daily as needed.  UDS and CSA updated today. As above. Flu shot today. F/u in 6 months. The patient and his spouse voiced understanding and agreement to the plan.  Mabel Mt Falman, DO 08/18/24  3:37 PM

## 2024-08-19 ENCOUNTER — Encounter: Payer: Self-pay | Admitting: Pharmacist Clinician (PhC)/ Clinical Pharmacy Specialist

## 2024-08-19 NOTE — Assessment & Plan Note (Signed)
 Will re-start metoprolol , along with diltiazem .  Patient to monitor home BP readings.

## 2024-08-19 NOTE — Assessment & Plan Note (Signed)
 Assessment: Patient with ASCVD not at LDL goal of < 70 Most recent LDL 89 on 01/26/24 Has been compliant with moderate intensity statin : atorvastatin  10 mg  Plan: Patient agreeable to increasing atorvastatin  to 20 mg daily Repeat labs after:  2-3 months Lipid Liver function

## 2024-08-20 LAB — MULTIPLE MYELOMA PANEL, SERUM
Albumin SerPl Elph-Mcnc: 3.4 g/dL (ref 2.9–4.4)
Albumin/Glob SerPl: 1.2 (ref 0.7–1.7)
Alpha 1: 0.3 g/dL (ref 0.0–0.4)
Alpha2 Glob SerPl Elph-Mcnc: 0.8 g/dL (ref 0.4–1.0)
B-Globulin SerPl Elph-Mcnc: 1.2 g/dL (ref 0.7–1.3)
Gamma Glob SerPl Elph-Mcnc: 0.7 g/dL (ref 0.4–1.8)
Globulin, Total: 3 g/dL (ref 2.2–3.9)
IgA: 460 mg/dL — ABNORMAL HIGH (ref 61–437)
IgG (Immunoglobin G), Serum: 924 mg/dL (ref 603–1613)
IgM (Immunoglobulin M), Srm: 53 mg/dL (ref 15–143)
Total Protein ELP: 6.4 g/dL (ref 6.0–8.5)

## 2024-08-23 ENCOUNTER — Ambulatory Visit: Admitting: Pulmonary Disease

## 2024-08-23 ENCOUNTER — Ambulatory Visit: Admitting: Family Medicine

## 2024-08-23 DIAGNOSIS — I4729 Other ventricular tachycardia: Secondary | ICD-10-CM

## 2024-08-23 DIAGNOSIS — I493 Ventricular premature depolarization: Secondary | ICD-10-CM

## 2024-08-23 DIAGNOSIS — I10 Essential (primary) hypertension: Secondary | ICD-10-CM

## 2024-08-24 ENCOUNTER — Inpatient Hospital Stay

## 2024-08-24 DIAGNOSIS — Z79899 Other long term (current) drug therapy: Secondary | ICD-10-CM | POA: Diagnosis not present

## 2024-08-24 DIAGNOSIS — D472 Monoclonal gammopathy: Secondary | ICD-10-CM

## 2024-08-24 DIAGNOSIS — I82452 Acute embolism and thrombosis of left peroneal vein: Secondary | ICD-10-CM | POA: Diagnosis not present

## 2024-08-24 DIAGNOSIS — N1832 Chronic kidney disease, stage 3b: Secondary | ICD-10-CM | POA: Diagnosis not present

## 2024-08-24 DIAGNOSIS — D649 Anemia, unspecified: Secondary | ICD-10-CM

## 2024-08-24 DIAGNOSIS — Z7901 Long term (current) use of anticoagulants: Secondary | ICD-10-CM | POA: Diagnosis not present

## 2024-08-24 DIAGNOSIS — Q619 Cystic kidney disease, unspecified: Secondary | ICD-10-CM | POA: Diagnosis not present

## 2024-08-27 LAB — UPEP/UIFE/LIGHT CHAINS/TP, 24-HR UR
% BETA, Urine: 23.3 %
ALPHA 1 URINE: 5.8 %
Albumin, U: 40.7 %
Alpha 2, Urine: 9.6 %
Free Kappa Lt Chains,Ur: 36.43 mg/L (ref 1.17–86.46)
Free Kappa/Lambda Ratio: 7.47 (ref 1.83–14.26)
Free Lambda Lt Chains,Ur: 4.88 mg/L (ref 0.27–15.21)
GAMMA GLOBULIN URINE: 20.7 %
Total Protein, Urine-Ur/day: 85 mg/(24.h) (ref 30–150)
Total Protein, Urine: 7.1 mg/dL
Total Volume: 1200

## 2024-08-30 ENCOUNTER — Encounter: Admitting: Gastroenterology

## 2024-08-30 ENCOUNTER — Encounter (HOSPITAL_COMMUNITY)
Admission: RE | Admit: 2024-08-30 | Discharge: 2024-08-30 | Disposition: A | Discharge status: Home / Self Care | Source: Ambulatory Visit | Attending: Hematology & Oncology | Admitting: Hematology & Oncology

## 2024-08-30 DIAGNOSIS — K4020 Bilateral inguinal hernia, without obstruction or gangrene, not specified as recurrent: Secondary | ICD-10-CM | POA: Diagnosis not present

## 2024-08-30 DIAGNOSIS — D472 Monoclonal gammopathy: Secondary | ICD-10-CM | POA: Diagnosis not present

## 2024-08-30 LAB — GLUCOSE, CAPILLARY: Glucose-Capillary: 94 mg/dL (ref 70–99)

## 2024-08-30 MED ORDER — FLUDEOXYGLUCOSE F - 18 (FDG) INJECTION
9.1580 | Freq: Once | INTRAVENOUS | Status: AC | PRN
Start: 2024-08-30 — End: 2024-08-30
  Administered 2024-08-30: 9.158 via INTRAVENOUS

## 2024-08-31 ENCOUNTER — Other Ambulatory Visit: Payer: Self-pay | Admitting: *Deleted

## 2024-08-31 ENCOUNTER — Other Ambulatory Visit (HOSPITAL_BASED_OUTPATIENT_CLINIC_OR_DEPARTMENT_OTHER): Payer: Self-pay

## 2024-08-31 ENCOUNTER — Other Ambulatory Visit: Payer: Self-pay

## 2024-08-31 MED ORDER — RIVAROXABAN 20 MG PO TABS
20.0000 mg | ORAL_TABLET | Freq: Every day | ORAL | 0 refills | Status: DC
  Filled 2024-08-31: qty 30, 30d supply, fill #0

## 2024-09-01 ENCOUNTER — Ambulatory Visit: Payer: Self-pay | Admitting: Hematology & Oncology

## 2024-09-02 DIAGNOSIS — K4020 Bilateral inguinal hernia, without obstruction or gangrene, not specified as recurrent: Secondary | ICD-10-CM | POA: Diagnosis not present

## 2024-09-06 NOTE — Progress Notes (Signed)
 "  Office Visit Note  Patient: Alex Dunn             Date of Birth: January 25, 1936           MRN: [id number]             PCP: Frann Mabel Mt, DO Referring: Frann Mabel Mt* Visit Date: 09/07/2024 Occupation: Data Unavailable  Subjective:  Results  Discussed the use of AI scribe software for clinical note transcription with the patient, who gave verbal consent to proceed.  History of Present Illness Alex Dunn is an 88 year old male who presents with worsening fatigue and shaking episodes.  He experiences worsening fatigue, significantly impacting daily activities, and is unable to stand for more than a few minutes, such as when shaving. The fatigue has progressively worsened since starting Xarelto  for a blood clot. He also experiences shaking episodes lasting five to ten minutes, occurring without flu-like symptoms. He was seen by hematology due to his persistent fatigue. A PET scan was normal, but a bone marrow biopsy is scheduled to investigate further.  He was diagnosed with a blood clot following his third COVID-19 infection. He is currently on Xarelto , a blood thinner, expected to continue for three months. He was told his blood clot was due to COVID-19.  He experiences swelling and discoloration in his legs. He has a history of neuropathy.   He has a history of neuropathy and takes tramadol , usually at night, to help with sleep and manage pain. His pain has worsened, leading to more frequent tramadol  use.     Activities of Daily Living:  Patient reports morning stiffness for 0 minutes.   Patient Reports nocturnal pain.  Difficulty dressing/grooming: Denies Difficulty climbing stairs: Reports Difficulty getting out of chair: Reports Difficulty using hands for taps, buttons, cutlery, and/or writing: Reports  Review of Systems  Constitutional:  Positive for fatigue.  HENT:  Positive for mouth dryness. Negative for mouth sores.   Eyes:  Negative for  dryness.  Respiratory:  Positive for shortness of breath.   Cardiovascular:  Positive for palpitations. Negative for chest pain.  Gastrointestinal:  Positive for constipation. Negative for blood in stool and diarrhea.  Endocrine: Negative for increased urination.  Genitourinary:  Negative for involuntary urination.  Musculoskeletal:  Positive for joint pain, gait problem, joint pain, joint swelling, myalgias and myalgias. Negative for muscle weakness, morning stiffness and muscle tenderness.  Skin:  Negative for color change, rash, hair loss and sensitivity to sunlight.  Allergic/Immunologic: Positive for susceptible to infections.  Neurological:  Positive for dizziness. Negative for headaches.  Hematological:  Negative for swollen glands.  Psychiatric/Behavioral:  Negative for depressed mood and sleep disturbance. The patient is nervous/anxious.     PMFS History:  Patient Active Problem List   Diagnosis Date Noted   TIA (transient ischemic attack) 03/11/2023   Vertigo 03/09/2023   Dyslipidemia 03/09/2023   Orthostatic hypotension 03/09/2023   DNR (do not resuscitate) 03/09/2023   Cerebellar ataxia in diseases classified elsewhere (HCC) 12/07/2021   CIDP (chronic inflammatory demyelinating polyneuropathy) (HCC) 12/07/2021   History of CVA (cerebrovascular accident) 09/14/2021   Neuropathic pain 06/05/2021   PVC's (premature ventricular contractions) 03/01/2021   Polycystic kidney disease    MGUS (monoclonal gammopathy of unknown significance)    Diverticulosis    Stage 3b chronic kidney disease (HCC) 06/21/2020   B12 deficiency 06/13/2020   Ataxia 02/14/2020   Bilateral leg pain 02/14/2020   Weakness of both lower extremities 01/10/2020  Balance problem 01/10/2020   Nonsustained ventricular tachycardia (HCC) 11/03/2019   Lumbar radiculopathy 05/06/2019   Lumbosacral spondylosis without myelopathy 03/10/2019   Low back pain 03/09/2019   Monoclonal gammopathy of unknown  significance (MGUS) 08/24/2018   Primary osteoarthritis of both knees 06/30/2018   Degeneration of lumbar intervertebral disc 06/30/2018   Essential hypertension 04/15/2018   Diverticular disease 04/15/2018   Abdominal aortic atherosclerosis 04/15/2018   Elevated diaphragm 04/15/2018   Multiple renal cysts 04/06/2018   Night sweats 03/13/2018   Weakness 03/13/2018   Chronic kidney disease 10/18/2016   ANEMIA-IRON DEFICIENCY 06/23/2007   Gastro-esophageal reflux disease without esophagitis 06/23/2007   BPH (benign prostatic hyperplasia) 06/23/2007   GERD 06/23/2007    Past Medical History:  Diagnosis Date   Abdominal aortic atherosclerosis 04/15/2018   ANEMIA-IRON DEFICIENCY 06/23/2007   B12 deficiency 06/13/2020   BPH (benign prostatic hyperplasia) 06/23/2007   Qualifier: Diagnosis of  By: Nunzio, RN, Dagoberto Caldron   Formatting of this note might be different from the original. Formatting of this note might be different from the original. Qualifier: Diagnosis of  By: Nunzio, RN, Dagoberto Caldron   Degeneration of lumbar intervertebral disc 06/30/2018   Diverticulosis    DVT (deep venous thrombosis) (HCC)    Left lower calf.   Elevated diaphragm 04/15/2018   Essential hypertension 04/15/2018   Gastro-esophageal reflux disease without esophagitis 06/23/2007   Qualifier: Diagnosis of  By: Nunzio, RN, Dagoberto Caldron   Formatting of this note might be different from the original. Formatting of this note might be different from the original. Qualifier: Diagnosis of  By: Nunzio, RN, Dagoberto Caldron   Hernia, inguinal    Lumbar radiculopathy 05/06/2019   Lumbosacral spondylosis without myelopathy 03/10/2019   Monoclonal gammopathy of unknown significance (MGUS) 08/24/2018   Multiple renal cysts 04/06/2018   Nonsustained ventricular tachycardia (HCC) 11/03/2019   Primary osteoarthritis of both knees 06/30/2018   Bilateral moderate with moderate chondromalacia patella Formatting of this note might be different from  the original. Formatting of this note might be different from the original. Bilateral moderate with moderate chondromalacia patella   Stage 3b chronic kidney disease (HCC) 06/21/2020    Family History  Problem Relation Age of Onset   Heart attack Father    Early death Father    Fibromyalgia Sister    Heart attack Brother        leaky valve   Endometriosis Daughter    Migraines Daughter    Neuromuscular disorder Neg Hx    Past Surgical History:  Procedure Laterality Date   HAND SURGERY Right    LUNG SURGERY     for collapsed lung   SPINAL CORD STIMULATOR IMPLANT  01/24/2023   Social History   Tobacco Use   Smoking status: Never    Passive exposure: Never   Smokeless tobacco: Never  Vaping Use   Vaping status: Never Used  Substance Use Topics   Alcohol use: Never   Drug use: Never   Social History   Social History Narrative   Lives at home with his wife   Retired   Right handed   Caffeine: about 2 cups daily     Immunization History  Administered Date(s) Administered   Fluad Quad(high Dose 65+) 07/28/2019, 09/07/2020, 08/24/2021, 08/13/2022   Fluad Trivalent(High Dose 65+) 07/28/2023   INFLUENZA, HIGH DOSE SEASONAL PF 09/02/2013, 08/21/2016, 08/27/2017, 09/10/2018, 08/18/2024   Influenza Split 08/30/2011, 09/01/2012   Influenza Whole 09/03/2007, 08/11/2008, 09/13/2009, 08/24/2010   Influenza,inj,Quad PF,6+ Mos 09/05/2014,  08/01/2015   Influenza-Unspecified 08/30/2011, 09/01/2012, 09/02/2013, 09/05/2014, 08/01/2015, 08/21/2016, 08/27/2017, 08/20/2018, 09/10/2018   PFIZER Comirnaty(Gray Top)Covid-19 Tri-Sucrose Vaccine 06/18/2021   PFIZER(Purple Top)SARS-COV-2 Vaccination 11/16/2019, 12/04/2019, 07/11/2020   PPD Test 03/09/2018   Pneumococcal Conjugate-13 09/05/2014   Pneumococcal Polysaccharide-23 11/09/2009   Tdap 08/17/2003   Tetanus 09/02/2013   Zoster Recombinant(Shingrix) 01/20/2015   Zoster, Live 07/31/2014, 01/20/2015     Objective: Vital Signs:  Temp (!) 97.5 F (36.4 C)   Resp 14   Ht 6' (1.829 m)   Wt 180 lb 3.2 oz (81.7 kg)   BMI 24.44 kg/m    Physical Exam Vitals and nursing note reviewed.  HENT:     Head: Normocephalic and atraumatic.     Nose: Nose normal.  Eyes:     Conjunctiva/sclera: Conjunctivae normal.     Pupils: Pupils are equal, round, and reactive to light.  Pulmonary:     Effort: Pulmonary effort is normal. No respiratory distress.  Skin:    General: Skin is warm and dry.  Neurological:     Mental Status: He is alert. Mental status is at baseline.  Psychiatric:        Mood and Affect: Mood normal.        Behavior: Behavior normal.      Musculoskeletal Exam:   CDAI Exam: CDAI Score: -- Patient Global: --; Provider Global: -- Swollen: --; Tender: -- Joint Exam 09/07/2024   No joint exam has been documented for this visit   There is currently no information documented on the homunculus. Go to the Rheumatology activity and complete the homunculus joint exam.  Investigation: No additional findings.  Imaging: NM PET Image Initial (PI) Whole Body Result Date: 09/01/2024 CLINICAL DATA:  Initial treatment strategy for monoclonal gammopathy of unknown significance. EXAM: NUCLEAR MEDICINE PET WHOLE BODY TECHNIQUE: 9.158 mCi F-18 FDG was injected intravenously. Full-ring PET imaging was performed from the head to foot after the radiotracer. CT data was obtained and used for attenuation correction and anatomic localization. Fasting blood glucose: 94 mg/dl COMPARISON:  CT abdomen pelvis August 09, 2024. FINDINGS: Mediastinal blood pool activity: SUV max 2.4 HEAD/NECK: No lymphadenopathy. Incidental CT findings: none CHEST: Scattered pulmonary micronodules below PET resolution for example left lower lobe 7/43, right lower lobe 7/21). No hypermetabolic lymphadenopathy. Atherosclerotic calcifications of coronary arteries. No pleural effusion. Biapical pleuroparenchymal scarring. ABDOMEN/PELVIS: No suspicious  focal metabolic activity to suggest extra osseous myeloma. No suspicious lymphadenopathy. Incidental CT findings: Elevation of right hemidiaphragm, Chilaiditi syndrome. Atrophic changes of the pancreas. Simple bilateral renal cortical cysts with photopenia right-greater-than-left. Small fat containing umbilical hernia with a defect measuring 1 cm. Bilateral small hydrocele. Right scrotal sac scrotolith. Bilateral fat containing inguinal hernias. Slight tenting of a small bowel loop to the right inguinal canal internal os without discrete herniation, strangulation or obstruction. Prostatomegaly. SKELETON: No suspicious osseous abnormality to suggest myeloma involvement. Multilevel degenerative changes of the spine. Incidental CT findings: Neurostimulator terminating in lower thoracic spinal canal. EXTREMITIES: No suspicious osseous lesion. Incidental CT findings: none IMPRESSION: No suspicious osseous or extraosseous findings to suggest active myeloma involvement. Few scattered pulmonary micronodules, below PET resolution. Follow-up to ensure stability. Other findings are stable to recent CT abdomen pelvis. Electronically Signed   By: Megan  Zare M.D.   On: 09/01/2024 15:18    Recent Labs: Lab Results  Component Value Date   WBC 7.1 08/16/2024   HGB 13.3 08/16/2024   PLT 190 08/16/2024   NA 136 08/16/2024   K 4.4 08/16/2024  CL 102 08/16/2024   CO2 25 08/16/2024   GLUCOSE 126 (H) 08/16/2024   BUN 25 (H) 08/16/2024   CREATININE 1.76 (H) 08/16/2024   BILITOT 0.5 08/16/2024   ALKPHOS 127 (H) 08/16/2024   AST 20 08/16/2024   ALT 17 08/16/2024   PROT 6.6 08/16/2024   ALBUMIN 3.9 08/16/2024   CALCIUM  8.9 08/16/2024   GFRAA 48 (L) 04/27/2020   QFTBGOLDPLUS NEGATIVE 06/15/2018    Speciality Comments: No specialty comments available.  Procedures:  No procedures performed Allergies: Amitriptyline, Codeine phosphate, Pregabalin, Tamsulosin , Tape, and Lexapro  [escitalopram  oxalate]   Assessment  / Plan:     Visit Diagnoses:   Positive ANA (antinuclear antibody)  Mildly positive dsDNA Patient with mild positive ANA and mildly positive dsDNA, slightly increased from prior study 6 years ago. However, his overall manifestation is not really suggestive of an underlying SLE. Patient does not have any features of SLE (no objective malar rash, oral ulcers, inflammatory arthritis, Raynaud's, alopecia, recurrent cytopenias), Sjogren's disease (no sicca symptoms), scleroderma (no sclerodactyly, no Raynaud's).   As discussed with patient, a positive ANA need not represent presence of clinically active systemic autoimmune disease. Can be positive in the normal population, autoimmune thyroid  disease (Graves' disease, Hashimoto's thyroiditis etc.), or infection. ANA can be positive in the healthy population at the following rates: ANA 1:40: 20%-30%, ANA 1:80: 10%-15%, ANA 1:160: 5%, ANA 1:320: 3% positive, healthy relative of an SLE patient: 5%-25% positive (usually low titers), and elderly (age >70 years): up to 70% positive at ANA titer 1:40.   Did discuss with patient that if hematologic work-up is negative, can consider trial of HCQ or fatigue given mild positive dsDNA abs. However, discussed with patient unclear if this would provide any true benefit given lack of other systemic symptoms concerning for SLE. Patient verbalizes understanding.   Fatigue Unexplained weight loss Patient's primary symptom appears to be fatigue of unclear etiology. Agree with hematology work-up for further evaluation.   Orders: No orders of the defined types were placed in this encounter.  No orders of the defined types were placed in this encounter.   I personally spent a total of 45 minutes in the care of the patient today including preparing to see the patient, getting/reviewing separately obtained history, performing a medically appropriate exam/evaluation, counseling and educating, documenting clinical  information in the EHR, independently interpreting results, and communicating results.   Follow-Up Instructions: Return in about 3 months (around 12/08/2024).   Asberry Claw, DO  Note - This record has been created using Animal nutritionist.  Chart creation errors have been sought, but may not always  have been located. Such creation errors do not reflect on  the standard of medical care. "

## 2024-09-07 ENCOUNTER — Encounter: Payer: Self-pay | Admitting: Pharmacist Clinician (PhC)/ Clinical Pharmacy Specialist

## 2024-09-07 ENCOUNTER — Ambulatory Visit

## 2024-09-07 VITALS — Temp 97.5°F | Resp 14 | Ht 72.0 in | Wt 180.2 lb

## 2024-09-07 DIAGNOSIS — R7689 Other specified abnormal immunological findings in serum: Secondary | ICD-10-CM

## 2024-09-07 DIAGNOSIS — R634 Abnormal weight loss: Secondary | ICD-10-CM | POA: Diagnosis not present

## 2024-09-07 DIAGNOSIS — M791 Myalgia, unspecified site: Secondary | ICD-10-CM | POA: Diagnosis not present

## 2024-09-07 DIAGNOSIS — E785 Hyperlipidemia, unspecified: Secondary | ICD-10-CM

## 2024-09-07 DIAGNOSIS — R5383 Other fatigue: Secondary | ICD-10-CM

## 2024-09-07 NOTE — Patient Instructions (Signed)
 Please ask your eye doctor about Hydroxychloroquine (Plaquenil).

## 2024-09-16 ENCOUNTER — Other Ambulatory Visit: Payer: Self-pay | Admitting: Family Medicine

## 2024-09-17 ENCOUNTER — Other Ambulatory Visit: Payer: Self-pay

## 2024-09-17 DIAGNOSIS — Z01812 Encounter for preprocedural laboratory examination: Secondary | ICD-10-CM

## 2024-09-18 NOTE — H&P (Signed)
 Chief Complaint: Monoclonal gammopathy of uncertain significance; referred for image guided bone marrow biopsy for further evaluation/rule out myeloma/smoldering myeloma  Referring Provider(s): Ennever,P  Supervising Physician: Hughes Simmonds  Patient Status: [facility] - Out-pt  History of Present Illness: Alex Dunn is an 88 y.o. male with past medical history significant for anemia, B12 deficiency, BPH, diverticulosis, left lower extremity DVT, hypertension, GERD, lumbar radiculopathy, renal cysts, osteoarthritis, chronic kidney disease, ventricular tachycardia, abdominal aortic atherosclerosis, spinal cord stimulator implant and monoclonal gammopathy of uncertain significance.  He presents today for image guided bone marrow biopsy to rule out smoldering myeloma/myeloma.  *** Patient is Full Code  Past Medical History:  Diagnosis Date   Abdominal aortic atherosclerosis 04/15/2018   ANEMIA-IRON DEFICIENCY 06/23/2007   B12 deficiency 06/13/2020   BPH (benign prostatic hyperplasia) 06/23/2007   Qualifier: Diagnosis of  By: Nunzio, RN, Dagoberto Caldron   Formatting of this note might be different from the original. Formatting of this note might be different from the original. Qualifier: Diagnosis of  By: Nunzio, RN, Dagoberto Caldron   Degeneration of lumbar intervertebral disc 06/30/2018   Diverticulosis    DVT (deep venous thrombosis) (HCC)    Left lower calf.   Elevated diaphragm 04/15/2018   Essential hypertension 04/15/2018   Gastro-esophageal reflux disease without esophagitis 06/23/2007   Qualifier: Diagnosis of  By: Nunzio, RN, Dagoberto Caldron   Formatting of this note might be different from the original. Formatting of this note might be different from the original. Qualifier: Diagnosis of  By: Nunzio, RN, Dagoberto Caldron   Hernia, inguinal    Lumbar radiculopathy 05/06/2019   Lumbosacral spondylosis without myelopathy 03/10/2019   Monoclonal gammopathy of unknown significance (MGUS) 08/24/2018   Multiple  renal cysts 04/06/2018   Nonsustained ventricular tachycardia (HCC) 11/03/2019   Primary osteoarthritis of both knees 06/30/2018   Bilateral moderate with moderate chondromalacia patella Formatting of this note might be different from the original. Formatting of this note might be different from the original. Bilateral moderate with moderate chondromalacia patella   Stage 3b chronic kidney disease (HCC) 06/21/2020    Past Surgical History:  Procedure Laterality Date   HAND SURGERY Right    LUNG SURGERY     for collapsed lung   SPINAL CORD STIMULATOR IMPLANT  01/24/2023    Allergies: Amitriptyline, Codeine phosphate, Pregabalin, Tamsulosin , Tape, and Lexapro  [escitalopram  oxalate]  Medications: Prior to Admission medications   Medication Sig Start Date End Date Taking? Authorizing Provider  acetaminophen  (TYLENOL ) 325 MG tablet Take 650 mg by mouth 4 (four) times daily as needed for mild pain or moderate pain (pain).    [provider]  aspirin  EC 81 MG tablet Take 81 mg by mouth daily. Swallow whole.    [provider]  atorvastatin  (LIPITOR) 20 MG tablet Take 1 tablet (20 mg total) by mouth daily. 08/13/24 11/11/24  Waddell Danelle LELON, MD  Cholecalciferol (VITAMIN D ) 50 MCG (2000 UT) CAPS Take 2,000 Units by mouth daily at 6 (six) AM.    [provider]  ciclopirox  (LOPROX ) 0.77 % cream APPLY TOPICALLY TO THE AFFECTED AREA TWICE DAILY 09/16/24   Frann Mabel Mt, DO  cyanocobalamin  1000 MCG tablet Take 1,000 mcg by mouth daily.    [provider]  diltiazem  (CARDIZEM  CD) 120 MG 24 hr capsule Take 1 capsule (120 mg total) by mouth daily. 07/28/24   Aniceto Daphne CROME, NP  fluticasone  (FLONASE ) 50 MCG/ACT nasal spray Place 2 sprays into both nostrils daily. 03/19/23  Frann Mabel Mt, DO  metoprolol  succinate (TOPROL  XL) 25 MG 24 hr tablet Take 1 tablet (25 mg total) by mouth daily. 08/13/24   Waddell Danelle ORN, MD  Multiple Vitamins-Minerals  (PRESERVISION AREDS) CAPS Take 1 capsule by mouth daily. 06/21/20   [provider]  pantoprazole  (PROTONIX ) 40 MG tablet Take 1 tablet (40 mg total) by mouth daily as needed. 06/30/24   Frann Mabel Mt, DO  rivaroxaban (XARELTO) 20 MG TABS tablet Take 1 tablet (20 mg total) by mouth daily with supper. 08/31/24   Timmy Maude SAUNDERS, MD  Rivaroxaban Starter Pack, 15 mg and 20 mg, (XARELTO STARTER PACK) Take one 15 mg tablet by mouth twice a day for 21 days.  On day 22, switch to one 20 mg tablet once a day.  Take with food. 08/09/24   Zackowski, Scott, MD  traMADol  (ULTRAM ) 50 MG tablet Take 1 tablet (50 mg total) by mouth every 12 (twelve) hours as needed. 04/05/24   Frann Mabel Mt, DO     Family History  Problem Relation Age of Onset   Heart attack Father    Early death Father    Fibromyalgia Sister    Heart attack Brother        leaky valve   Endometriosis Daughter    Migraines Daughter    Neuromuscular disorder Neg Hx     Social History   Socioeconomic History   Marital status: Married    Spouse name: Not on file   Number of children: 1   Years of education: Not on file   Highest education level: High school graduate  Occupational History   Occupation: retired  Tobacco Use   Smoking status: Never    Passive exposure: Never   Smokeless tobacco: Never  Vaping Use   Vaping status: Never Used  Substance and Sexual Activity   Alcohol use: Never   Drug use: Never   Sexual activity: Not Currently  Other Topics Concern   Not on file  Social History Narrative   Lives at home with his wife   Retired   Right handed   Caffeine: about 2 cups daily   Social Drivers of Corporate Investment Banker Strain: Low Risk  (01/08/2022)   Overall Financial Resource Strain (CARDIA)    Difficulty of Paying Living Expenses: Not hard at all  Food Insecurity: No Food Insecurity (03/14/2023)   Hunger Vital Sign    Worried About Running Out of Food in the Last Year: Never  true    Ran Out of Food in the Last Year: Never true  Transportation Needs: No Transportation Needs (03/14/2023)   PRAPARE - Administrator, Civil Service (Medical): No    Lack of Transportation (Non-Medical): No  Physical Activity: Inactive (01/08/2022)   Exercise Vital Sign    Days of Exercise per Week: 0 days    Minutes of Exercise per Session: 0 min  Stress: No Stress Concern Present (01/08/2022)   [employer] of Occupational Health - Occupational Stress Questionnaire    Feeling of Stress : Not at all  Social Connections: Moderately Integrated (01/08/2022)   Social Connection and Isolation Panel    Frequency of Communication with Friends and Family: Three times a week    Frequency of Social Gatherings with Friends and Family: More than three times a week    Attends Religious Services: More than 4 times per year    Active Member of Clubs or Organizations: No    Attends [employer]  or Organization Meetings: Never    Marital Status: Married       Review of Systems  Vital Signs:   Advance Care Plan: no documents on file  Physical Exam  Imaging: NM PET Image Initial (PI) Whole Body Result Date: 09/01/2024 CLINICAL DATA:  Initial treatment strategy for monoclonal gammopathy of unknown significance. EXAM: NUCLEAR MEDICINE PET WHOLE BODY TECHNIQUE: 9.158 mCi F-18 FDG was injected intravenously. Full-ring PET imaging was performed from the head to foot after the radiotracer. CT data was obtained and used for attenuation correction and anatomic localization. Fasting blood glucose: 94 mg/dl COMPARISON:  CT abdomen pelvis August 09, 2024. FINDINGS: Mediastinal blood pool activity: SUV max 2.4 HEAD/NECK: No lymphadenopathy. Incidental CT findings: none CHEST: Scattered pulmonary micronodules below PET resolution for example left lower lobe 7/43, right lower lobe 7/21). No hypermetabolic lymphadenopathy. Atherosclerotic calcifications of coronary arteries. No pleural effusion. Biapical  pleuroparenchymal scarring. ABDOMEN/PELVIS: No suspicious focal metabolic activity to suggest extra osseous myeloma. No suspicious lymphadenopathy. Incidental CT findings: Elevation of right hemidiaphragm, Chilaiditi syndrome. Atrophic changes of the pancreas. Simple bilateral renal cortical cysts with photopenia right-greater-than-left. Small fat containing umbilical hernia with a defect measuring 1 cm. Bilateral small hydrocele. Right scrotal sac scrotolith. Bilateral fat containing inguinal hernias. Slight tenting of a small bowel loop to the right inguinal canal internal os without discrete herniation, strangulation or obstruction. Prostatomegaly. SKELETON: No suspicious osseous abnormality to suggest myeloma involvement. Multilevel degenerative changes of the spine. Incidental CT findings: Neurostimulator terminating in lower thoracic spinal canal. EXTREMITIES: No suspicious osseous lesion. Incidental CT findings: none IMPRESSION: No suspicious osseous or extraosseous findings to suggest active myeloma involvement. Few scattered pulmonary micronodules, below PET resolution. Follow-up to ensure stability. Other findings are stable to recent CT abdomen pelvis. Electronically Signed   By: Megan  Zare M.D.   On: 09/01/2024 15:18    Labs:  CBC: Recent Labs    05/23/24 0542 07/14/24 1453 08/09/24 0456 08/16/24 1314  WBC 6.1 7.2 6.9 7.1  HGB 13.5 12.8* 13.6 13.3  HCT 41.6 39.3 42.4 40.6  PLT 157 157 183 190    COAGS: Recent Labs    05/03/24 1323  INR 1.0  APTT 29    BMP: Recent Labs    05/23/24 0542 07/14/24 1453 08/09/24 0456 08/16/24 1314  NA 140 135 140 136  K 4.5 3.9 4.3 4.4  CL 108 97* 106 102  CO2 23 27 22 25   GLUCOSE 96 124* 104* 126*  BUN 23 32* 19 25*  CALCIUM  9.0 9.1 9.4 8.9  CREATININE 1.58* 2.05* 1.57* 1.76*  GFRNONAA 43* 31* 43* 37*    LIVER FUNCTION TESTS: Recent Labs    05/23/24 0542 06/28/24 1529 07/14/24 1453 08/09/24 0456 08/16/24 1314  BILITOT 0.7   --  0.5 0.7 0.5  AST 18  --  19 23 20   ALT 20  --  17 19 17   ALKPHOS 98  --  104 125 127*  PROT 6.6 6.7 6.6 6.6 6.6  ALBUMIN 3.6  --  3.8 3.8 3.9    TUMOR MARKERS: No results for input(s): AFPTM, CEA, CA199, CHROMGRNA in the last 8760 hours.  Assessment and Plan: 88 y.o. male with past medical history significant for anemia, B12 deficiency, BPH, diverticulosis, left lower extremity DVT, hypertension, GERD, lumbar radiculopathy, renal cysts, osteoarthritis, chronic kidney disease, ventricular tachycardia, abdominal aortic atherosclerosis, spinal cord stimulator implant and monoclonal gammopathy of uncertain significance.  He presents today for image guided bone marrow biopsy to rule out  smoldering myeloma/myeloma.Risks and benefits of procedure was discussed with the patient  including, but not limited to bleeding, infection, damage to adjacent structures or low yield requiring additional tests.  All of the questions were answered and there is agreement to proceed.  Consent signed and in chart.    Thank you for allowing our service to participate in Alex Dunn 's care.  Electronically Signed: D. Franky Rakers, PA-C   09/18/2024, 2:03 PM      I spent a total of   20 minutes  in face to face in clinical consultation, greater than 50% of which was counseling/coordinating care for image guided bone marrow biopsy

## 2024-09-20 ENCOUNTER — Encounter (HOSPITAL_COMMUNITY): Payer: Self-pay

## 2024-09-20 ENCOUNTER — Ambulatory Visit (HOSPITAL_COMMUNITY)
Admission: RE | Admit: 2024-09-20 | Discharge: 2024-09-20 | Disposition: A | Discharge status: Home / Self Care | Source: Ambulatory Visit | Attending: Hematology & Oncology | Admitting: Hematology & Oncology

## 2024-09-20 DIAGNOSIS — Z01812 Encounter for preprocedural laboratory examination: Secondary | ICD-10-CM | POA: Insufficient documentation

## 2024-09-20 DIAGNOSIS — D472 Monoclonal gammopathy: Secondary | ICD-10-CM | POA: Insufficient documentation

## 2024-09-20 LAB — CBC WITH DIFFERENTIAL/PLATELET
Abs Immature Granulocytes: 0.02 K/uL (ref 0.00–0.07)
Basophils Absolute: 0.1 K/uL (ref 0.0–0.1)
Basophils Relative: 1 %
Eosinophils Absolute: 0.1 K/uL (ref 0.0–0.5)
Eosinophils Relative: 1 %
HCT: 43.2 % (ref 39.0–52.0)
Hemoglobin: 14 g/dL (ref 13.0–17.0)
Immature Granulocytes: 0 %
Lymphocytes Relative: 20 %
Lymphs Abs: 1.5 K/uL (ref 0.7–4.0)
MCH: 31.7 pg (ref 26.0–34.0)
MCHC: 32.4 g/dL (ref 30.0–36.0)
MCV: 97.7 fL (ref 80.0–100.0)
Monocytes Absolute: 0.6 K/uL (ref 0.1–1.0)
Monocytes Relative: 7 %
Neutro Abs: 5.5 K/uL (ref 1.7–7.7)
Neutrophils Relative %: 71 %
Platelets: 178 K/uL (ref 150–400)
RBC: 4.42 MIL/uL (ref 4.22–5.81)
RDW: 13.1 % (ref 11.5–15.5)
WBC: 7.7 K/uL (ref 4.0–10.5)
nRBC: 0 % (ref 0.0–0.2)

## 2024-09-20 LAB — PROTIME-INR
INR: 1.8 — ABNORMAL HIGH (ref 0.8–1.2)
Prothrombin Time: 21.5 s — ABNORMAL HIGH (ref 11.4–15.2)

## 2024-09-20 MED ORDER — SODIUM CHLORIDE 0.9 % IV SOLN: INTRAVENOUS | Status: DC

## 2024-09-20 MED ORDER — FENTANYL CITRATE (PF) 100 MCG/2ML IJ SOLN
INTRAMUSCULAR | Status: AC
  Filled 2024-09-20: qty 2

## 2024-09-20 MED ORDER — FENTANYL CITRATE (PF) 100 MCG/2ML IJ SOLN
INTRAMUSCULAR | Status: AC | PRN
  Administered 2024-09-20 (×2): 25 ug via INTRAVENOUS

## 2024-09-20 MED ORDER — MIDAZOLAM HCL (PF) 2 MG/2ML IJ SOLN
INTRAMUSCULAR | Status: AC | PRN
  Administered 2024-09-20: 1 mg via INTRAVENOUS

## 2024-09-20 MED ORDER — MIDAZOLAM HCL 2 MG/2ML IJ SOLN
INTRAMUSCULAR | Status: AC
  Filled 2024-09-20: qty 2

## 2024-09-20 NOTE — Procedures (Signed)
 Vascular and Interventional Radiology Procedure Note  Patient: Alex Dunn DOB: Aug 22, 1936 Medical Record Number: [id number] Note Date/Time: 09/20/24 11:18 AM   Performing Physician: Thom Hall, MD Assistant(s): None  Diagnosis: Dx MGUS  Procedure: BONE MARROW ASPIRATION and BIOPSY  Anesthesia: Conscious Sedation Complications: None Estimated Blood Loss: Minimal Specimens: Sent for Pathology  Findings:  Successful CT-guided bone marrow aspiration and biopsy A total of 1 cores were obtained. Hemostasis of the tract was achieved using Manual Pressure.  Plan: Bed rest for 1 hours.  See detailed procedure note with images in PACS. The patient tolerated the procedure well without incident or complication and was returned to Recovery in stable condition.    Thom Hall, MD [facility]   Pager. [phone] Clinic. [phone]

## 2024-09-20 NOTE — Discharge Instructions (Signed)
 CONTACT INTERVENTIONAL RADIOLOGY CLINIC AT [phone] WITH ANY QUESTIONS OR CONCERNS  MAY REMOVE DRESSING AND SHOWER TOMORROW

## 2024-09-22 LAB — SURGICAL PATHOLOGY

## 2024-09-27 ENCOUNTER — Encounter: Payer: Self-pay | Admitting: Hematology & Oncology

## 2024-09-27 ENCOUNTER — Other Ambulatory Visit: Payer: Self-pay

## 2024-09-27 ENCOUNTER — Inpatient Hospital Stay: Admitting: Hematology & Oncology

## 2024-09-27 ENCOUNTER — Encounter (HOSPITAL_COMMUNITY): Payer: Self-pay

## 2024-09-27 ENCOUNTER — Inpatient Hospital Stay: Attending: Hematology & Oncology

## 2024-09-27 VITALS — BP 147/64 | HR 61 | Temp 98.2°F | Resp 18 | Ht 72.0 in | Wt 181.0 lb

## 2024-09-27 DIAGNOSIS — D472 Monoclonal gammopathy: Secondary | ICD-10-CM | POA: Diagnosis not present

## 2024-09-27 DIAGNOSIS — I82402 Acute embolism and thrombosis of unspecified deep veins of left lower extremity: Secondary | ICD-10-CM | POA: Insufficient documentation

## 2024-09-27 DIAGNOSIS — I82452 Acute embolism and thrombosis of left peroneal vein: Secondary | ICD-10-CM | POA: Diagnosis not present

## 2024-09-27 DIAGNOSIS — M7989 Other specified soft tissue disorders: Secondary | ICD-10-CM | POA: Insufficient documentation

## 2024-09-27 DIAGNOSIS — Z7901 Long term (current) use of anticoagulants: Secondary | ICD-10-CM | POA: Insufficient documentation

## 2024-09-27 DIAGNOSIS — G629 Polyneuropathy, unspecified: Secondary | ICD-10-CM | POA: Insufficient documentation

## 2024-09-27 LAB — CMP (CANCER CENTER ONLY)
ALT: 21 U/L (ref 0–44)
AST: 20 U/L (ref 15–41)
Albumin: 3.9 g/dL (ref 3.5–5.0)
Alkaline Phosphatase: 119 U/L (ref 38–126)
Anion gap: 10 (ref 5–15)
BUN: 25 mg/dL — ABNORMAL HIGH (ref 8–23)
CO2: 25 mmol/L (ref 22–32)
Calcium: 9.1 mg/dL (ref 8.9–10.3)
Chloride: 104 mmol/L (ref 98–111)
Creatinine: 1.87 mg/dL — ABNORMAL HIGH (ref 0.61–1.24)
GFR, Estimated: 35 mL/min — ABNORMAL LOW (ref >60)
Glucose, Bld: 112 mg/dL — ABNORMAL HIGH (ref 70–99)
Potassium: 4.7 mmol/L (ref 3.5–5.1)
Sodium: 139 mmol/L (ref 135–145)
Total Bilirubin: 0.5 mg/dL (ref 0.0–1.2)
Total Protein: 6.7 g/dL (ref 6.5–8.1)

## 2024-09-27 LAB — CBC WITH DIFFERENTIAL (CANCER CENTER ONLY)
Abs Immature Granulocytes: 0.01 K/uL (ref 0.00–0.07)
Basophils Absolute: 0 K/uL (ref 0.0–0.1)
Basophils Relative: 1 %
Eosinophils Absolute: 0.3 K/uL (ref 0.0–0.5)
Eosinophils Relative: 4 %
HCT: 38.9 % — ABNORMAL LOW (ref 39.0–52.0)
Hemoglobin: 12.8 g/dL — ABNORMAL LOW (ref 13.0–17.0)
Immature Granulocytes: 0 %
Lymphocytes Relative: 26 %
Lymphs Abs: 1.6 K/uL (ref 0.7–4.0)
MCH: 31.9 pg (ref 26.0–34.0)
MCHC: 32.9 g/dL (ref 30.0–36.0)
MCV: 97 fL (ref 80.0–100.0)
Monocytes Absolute: 0.5 K/uL (ref 0.1–1.0)
Monocytes Relative: 8 %
Neutro Abs: 3.9 K/uL (ref 1.7–7.7)
Neutrophils Relative %: 61 %
Platelet Count: 169 K/uL (ref 150–400)
RBC: 4.01 MIL/uL — ABNORMAL LOW (ref 4.22–5.81)
RDW: 13.2 % (ref 11.5–15.5)
WBC Count: 6.3 K/uL (ref 4.0–10.5)
nRBC: 0 % (ref 0.0–0.2)

## 2024-09-27 LAB — LACTATE DEHYDROGENASE: LDH: 183 U/L (ref 105–235)

## 2024-09-27 NOTE — Progress Notes (Signed)
 Hematology and Oncology Follow Up Visit  Alex Dunn [id number] 1936/07/30 88 y.o. 09/27/2024   Principle Diagnosis:  IgA kappa MGUS Thromboembolic disease of left peroneal vein    Current Therapy:        Xarelto  20 mg p.o. daily - 08/10/2024   Interim History:  Alex Dunn is here today for follow-up. He is here with his wife.  We did do a fairly extensive workup on him.  Over the last, he was having a lot of complaints.  We ultimately did a bone marrow biopsy on him.  This was done on 11/17/2025WL.  The pathology report ([id number]) showed a normocellular bone marrow with 5% plasma cells.  I think this was pretty much unchanged from what he had about 5 or 6 years ago.  He had the kappa predominance.  He did have a bone marrow biopsy that was done.  This was done on 08/30/2024.  This did not show any evidence of bony metastasis.  We did do a 24-hour urine on him.  Surprisingly, this did show a monoclonal IgA kappa protein in the urine.  He only had 26 mg/L of kappa light chain.  Again, not sure why he feels so poorly.  He does feel a bit better.  Apparently, he had COVID and when took Paxlovid .  He said after taking Paxlovid  he started to feel better.  He does have neuropathy.  He does have a TENS unit in place.  I have suggested that he maybe go back to some physical therapy.  He has had no fever.  His appetite has been okay.  He has had no change in bowel or bladder habits.  He does have some swelling in the legs.  He had a blood clot in the left leg.  This is back in early October.  I probably will set him up with another ultrasound in January to see if the thrombus has improved.  He said that after he has bone marrow test that the swelling in his legs went down completely.  I am not sure how to reconcile that.  Of note, he says that his Rheumatologist thinks that he may have lupus.  This would certainly be logical.  However, he really does not have a lot of criteria for  lupus from what I can tell.  He has had no issues with cough.  He has had no headache..  Overall, I will say that his performance status is probably ECOG 2.   Wt Readings from Last 3 Encounters:  09/27/24 181 lb (82.1 kg)  09/07/24 180 lb 3.2 oz (81.7 kg)  08/18/24 181 lb 9.6 oz (82.4 kg)     Medications:  Allergies as of 09/27/2024       Reactions   Amitriptyline Other (See Comments)   SLEPT FOR 18-19 HOURS STRAIGHT   Codeine Phosphate Other (See Comments)   Seen odd things crawling up wall   Pregabalin Other (See Comments)   Drowsy, nightmares, stumbling   Tamsulosin  Other (See Comments)   Faint feeling   Tape Other (See Comments)   Skin irritation   Lexapro  [escitalopram  Oxalate] Other (See Comments)   shaking        Medication List        Accurate as of September 27, 2024  2:27 PM. If you have any questions, ask your nurse or doctor.          acetaminophen  325 MG tablet Commonly known as: TYLENOL  Take 650 mg by mouth 4 (  four) times daily as needed for mild pain or moderate pain (pain).   aspirin  EC 81 MG tablet Take 81 mg by mouth daily. Swallow whole.   atorvastatin  20 MG tablet Commonly known as: LIPITOR Take 1 tablet (20 mg total) by mouth daily.   ciclopirox  0.77 % cream Commonly known as: LOPROX  APPLY TOPICALLY TO THE AFFECTED AREA TWICE DAILY   cyanocobalamin  1000 MCG tablet Take 1,000 mcg by mouth daily.   diltiazem  120 MG 24 hr capsule Commonly known as: CARDIZEM  CD Take 1 capsule (120 mg total) by mouth daily.   fluticasone  50 MCG/ACT nasal spray Commonly known as: FLONASE  Place 2 sprays into both nostrils daily.   metoprolol  succinate 25 MG 24 hr tablet Commonly known as: Toprol  XL Take 1 tablet (25 mg total) by mouth daily.   pantoprazole  40 MG tablet Commonly known as: PROTONIX  Take 1 tablet (40 mg total) by mouth daily as needed.   PreserVision AREDS Caps Take 1 capsule by mouth daily.   traMADol  50 MG tablet Commonly  known as: ULTRAM  Take 1 tablet (50 mg total) by mouth every 12 (twelve) hours as needed.   Vitamin D  50 MCG (2000 UT) Caps Take 2,000 Units by mouth daily at 6 (six) AM.   Xarelto  20 MG Tabs tablet Generic drug: rivaroxaban  Take 1 tablet (20 mg total) by mouth daily with supper. What changed: Another medication with the same name was removed. Continue taking this medication, and follow the directions you see here. Changed by: Maude JONELLE Crease        Allergies:  Allergies  Allergen Reactions   Amitriptyline Other (See Comments)    SLEPT FOR 18-19 HOURS STRAIGHT   Codeine Phosphate Other (See Comments)    Seen odd things crawling up wall   Pregabalin Other (See Comments)    Drowsy, nightmares, stumbling   Tamsulosin  Other (See Comments)    Faint feeling    Tape Other (See Comments)    Skin irritation   Lexapro  [Escitalopram  Oxalate] Other (See Comments)    shaking    Past Medical History, Surgical history, Social history, and Family History were reviewed and updated.  Review of Systems: Review of Systems  Constitutional: Negative.   HENT: Negative.    Eyes: Negative.   Respiratory: Negative.    Cardiovascular: Negative.   Gastrointestinal: Negative.   Genitourinary: Negative.   Musculoskeletal: Negative.   Skin: Negative.   Neurological:  Positive for tingling.  Endo/Heme/Allergies: Negative.   Psychiatric/Behavioral: Negative.        Physical Exam:  Vital signs show temperature of 98.2.  Pulse 61.  Blood pressure 147/64.  Weight is 181 pounds.    Wt Readings from Last 3 Encounters:  09/27/24 181 lb (82.1 kg)  09/07/24 180 lb 3.2 oz (81.7 kg)  08/18/24 181 lb 9.6 oz (82.4 kg)   Physical Exam Vitals reviewed.  HENT:     Head: Normocephalic and atraumatic.  Eyes:     Pupils: Pupils are equal, round, and reactive to light.  Cardiovascular:     Rate and Rhythm: Normal rate and regular rhythm.     Heart sounds: Normal heart sounds.  Pulmonary:      Effort: Pulmonary effort is normal.     Breath sounds: Normal breath sounds.  Abdominal:     General: Bowel sounds are normal.     Palpations: Abdomen is soft.  Musculoskeletal:        General: No tenderness or deformity. Normal range of motion.  Cervical back: Normal range of motion.  Lymphadenopathy:     Cervical: No cervical adenopathy.  Skin:    General: Skin is warm and dry.     Findings: No erythema or rash.  Neurological:     Mental Status: He is alert and oriented to person, place, and time.  Psychiatric:        Behavior: Behavior normal.        Thought Content: Thought content normal.        Judgment: Judgment normal.      Lab Results  Component Value Date   WBC 6.3 09/27/2024   HGB 12.8 (L) 09/27/2024   HCT 38.9 (L) 09/27/2024   MCV 97.0 09/27/2024   PLT 169 09/27/2024   Lab Results  Component Value Date   FERRITIN 109 08/16/2024   IRON 96 08/16/2024   TIBC 286 08/16/2024   UIBC 190 08/16/2024   IRONPCTSAT 34 08/16/2024   Lab Results  Component Value Date   RETICCTPCT 0.8 08/16/2024   RBC 4.01 (L) 09/27/2024   Lab Results  Component Value Date   KPAFRELGTCHN 53.9 (H) 08/16/2024   LAMBDASER 29.8 (H) 08/16/2024   KAPLAMBRATIO 7.47 08/24/2024   Lab Results  Component Value Date   IGGSERUM 924 08/16/2024   IGA 460 (H) 08/16/2024   IGMSERUM 53 08/16/2024   Lab Results  Component Value Date   TOTALPROTELP 6.4 08/16/2024   ALBUMINELP 2.9 07/14/2024   A1GS 0.3 07/14/2024   A2GS 0.8 07/14/2024   BETS 1.2 07/14/2024   BETA2SER 0.5 06/28/2024   GAMS 0.7 07/14/2024   MSPIKE Not Observed 07/14/2024   SPEI Comment 07/14/2024     Chemistry      Component Value Date/Time   NA 136 08/16/2024 1314   NA 142 07/01/2019 1154   K 4.4 08/16/2024 1314   CL 102 08/16/2024 1314   CO2 25 08/16/2024 1314   BUN 25 (H) 08/16/2024 1314   BUN 25 07/01/2019 1154   CREATININE 1.76 (H) 08/16/2024 1314   CREATININE 1.61 (H) 02/26/2019 1511      Component  Value Date/Time   CALCIUM  8.9 08/16/2024 1314   ALKPHOS 127 (H) 08/16/2024 1314   AST 20 08/16/2024 1314   ALT 17 08/16/2024 1314   BILITOT 0.5 08/16/2024 1314       Impression and Plan: Mr. Ibarra is a very pleasant 88 y.o. caucasian gentleman with IgA kappa MGUS.   Again, I am not sure what else we do for right now.  The 1 thing that I know we can do is the Doppler of his leg.  We will set this up we will see him back.  Hopefully we will see that the thrombus is improving.  Hopefully, he will get through the Holiday season okay.  I just feel bad that he does not feel a whole lot better.  Thankfully has great support from his wife.  She really is doing a great job with him.   Maude JONELLE Crease, MD 11/24/20252:27 PM

## 2024-09-28 ENCOUNTER — Other Ambulatory Visit: Payer: Self-pay | Admitting: Hematology & Oncology

## 2024-09-28 ENCOUNTER — Ambulatory Visit

## 2024-09-28 ENCOUNTER — Other Ambulatory Visit (HOSPITAL_BASED_OUTPATIENT_CLINIC_OR_DEPARTMENT_OTHER): Payer: Self-pay

## 2024-09-28 ENCOUNTER — Other Ambulatory Visit: Payer: Self-pay | Admitting: Family Medicine

## 2024-09-28 DIAGNOSIS — G8929 Other chronic pain: Secondary | ICD-10-CM

## 2024-09-28 LAB — PROTEIN ELECTROPHORESIS, SERUM, WITH REFLEX
A/G Ratio: 1 (ref 0.7–1.7)
Albumin ELP: 3 g/dL (ref 2.9–4.4)
Alpha-1-Globulin: 0.2 g/dL (ref 0.0–0.4)
Alpha-2-Globulin: 0.8 g/dL (ref 0.4–1.0)
Beta Globulin: 1.1 g/dL (ref 0.7–1.3)
Gamma Globulin: 0.8 g/dL (ref 0.4–1.8)
Globulin, Total: 3 g/dL (ref 2.2–3.9)
Total Protein ELP: 6 g/dL (ref 6.0–8.5)

## 2024-09-28 LAB — IGG, IGA, IGM
IgA: 430 mg/dL (ref 61–437)
IgG (Immunoglobin G), Serum: 940 mg/dL (ref 603–1613)
IgM (Immunoglobulin M), Srm: 48 mg/dL (ref 15–143)

## 2024-09-28 LAB — KAPPA/LAMBDA LIGHT CHAINS
Kappa free light chain: 52 mg/L — ABNORMAL HIGH (ref 3.3–19.4)
Kappa, lambda light chain ratio: 1.9 — ABNORMAL HIGH (ref 0.26–1.65)
Lambda free light chains: 27.4 mg/L — ABNORMAL HIGH (ref 5.7–26.3)

## 2024-09-28 MED ORDER — RIVAROXABAN 20 MG PO TABS
20.0000 mg | ORAL_TABLET | Freq: Every day | ORAL | 0 refills | Status: DC
  Filled 2024-09-28: qty 30, 30d supply, fill #0

## 2024-09-28 NOTE — Telephone Encounter (Signed)
 Requesting: tramadol  50mg   Contract: 08/31/24 UDS: None Last Visit: 08/18/24 Next Visit: 02/14/25 Last Refill: 04/05/24 #90 and 1RF   Please Advise

## 2024-09-29 ENCOUNTER — Encounter (HOSPITAL_COMMUNITY): Payer: Self-pay

## 2024-09-29 ENCOUNTER — Other Ambulatory Visit (HOSPITAL_BASED_OUTPATIENT_CLINIC_OR_DEPARTMENT_OTHER): Payer: Self-pay

## 2024-10-02 ENCOUNTER — Other Ambulatory Visit: Payer: Self-pay | Admitting: Family Medicine

## 2024-10-05 NOTE — Progress Notes (Signed)
 He has MGUS.  I needed to see if this had progressed to smoldering myeloma.  Alex Dunn

## 2024-11-01 ENCOUNTER — Other Ambulatory Visit (HOSPITAL_BASED_OUTPATIENT_CLINIC_OR_DEPARTMENT_OTHER): Payer: Self-pay

## 2024-11-01 ENCOUNTER — Other Ambulatory Visit: Payer: Self-pay | Admitting: Hematology & Oncology

## 2024-11-01 MED ORDER — RIVAROXABAN 20 MG PO TABS
20.0000 mg | ORAL_TABLET | Freq: Every day | ORAL | 0 refills | Status: DC
  Filled 2024-11-01: qty 30, 30d supply, fill #0

## 2024-11-08 DIAGNOSIS — H6993 Unspecified Eustachian tube disorder, bilateral: Secondary | ICD-10-CM

## 2024-11-10 LAB — BASIC METABOLIC PANEL WITH GFR
BUN/Creatinine Ratio: 16 (ref 10–24)
BUN: 29 mg/dL — ABNORMAL HIGH (ref 8–27)
CO2: 23 mmol/L (ref 20–29)
Calcium: 9.3 mg/dL (ref 8.6–10.2)
Chloride: 104 mmol/L (ref 96–106)
Creatinine, Ser: 1.77 mg/dL — ABNORMAL HIGH (ref 0.76–1.27)
Glucose: 99 mg/dL (ref 70–99)
Potassium: 4.6 mmol/L (ref 3.5–5.2)
Sodium: 142 mmol/L (ref 134–144)
eGFR: 37 mL/min/1.73 — ABNORMAL LOW

## 2024-11-17 ENCOUNTER — Ambulatory Visit: Payer: Self-pay | Admitting: Pulmonary Disease

## 2024-11-24 ENCOUNTER — Other Ambulatory Visit: Payer: Self-pay

## 2024-11-24 ENCOUNTER — Other Ambulatory Visit: Payer: Self-pay | Admitting: Hematology & Oncology

## 2024-11-24 ENCOUNTER — Other Ambulatory Visit (HOSPITAL_BASED_OUTPATIENT_CLINIC_OR_DEPARTMENT_OTHER): Payer: Self-pay

## 2024-11-24 ENCOUNTER — Other Ambulatory Visit: Payer: Self-pay | Admitting: Family Medicine

## 2024-11-24 MED ORDER — RIVAROXABAN 20 MG PO TABS
20.0000 mg | ORAL_TABLET | Freq: Every day | ORAL | 0 refills | Status: AC
  Filled 2024-11-24: qty 30, 30d supply, fill #0

## 2024-11-26 ENCOUNTER — Other Ambulatory Visit (HOSPITAL_BASED_OUTPATIENT_CLINIC_OR_DEPARTMENT_OTHER)

## 2024-11-26 ENCOUNTER — Other Ambulatory Visit (HOSPITAL_BASED_OUTPATIENT_CLINIC_OR_DEPARTMENT_OTHER): Payer: Self-pay

## 2024-11-26 ENCOUNTER — Inpatient Hospital Stay: Admitting: Hematology & Oncology

## 2024-11-26 ENCOUNTER — Ambulatory Visit (HOSPITAL_BASED_OUTPATIENT_CLINIC_OR_DEPARTMENT_OTHER)
Admission: RE | Admit: 2024-11-26 | Discharge: 2024-11-26 | Disposition: A | Source: Ambulatory Visit | Attending: Hematology & Oncology | Admitting: Hematology & Oncology

## 2024-11-26 ENCOUNTER — Other Ambulatory Visit

## 2024-11-26 ENCOUNTER — Encounter: Payer: Self-pay | Admitting: Hematology & Oncology

## 2024-11-26 ENCOUNTER — Ambulatory Visit (HOSPITAL_BASED_OUTPATIENT_CLINIC_OR_DEPARTMENT_OTHER)

## 2024-11-26 ENCOUNTER — Inpatient Hospital Stay: Attending: Hematology & Oncology

## 2024-11-26 VITALS — BP 150/67 | HR 70 | Temp 98.9°F | Resp 19 | Ht 72.0 in | Wt 182.0 lb

## 2024-11-26 DIAGNOSIS — I82452 Acute embolism and thrombosis of left peroneal vein: Secondary | ICD-10-CM

## 2024-11-26 DIAGNOSIS — E538 Deficiency of other specified B group vitamins: Secondary | ICD-10-CM | POA: Diagnosis not present

## 2024-11-26 DIAGNOSIS — N183 Chronic kidney disease, stage 3 unspecified: Secondary | ICD-10-CM

## 2024-11-26 LAB — CBC WITH DIFFERENTIAL (CANCER CENTER ONLY)
Abs Immature Granulocytes: 0.03 K/uL (ref 0.00–0.07)
Basophils Absolute: 0 K/uL (ref 0.0–0.1)
Basophils Relative: 0 %
Eosinophils Absolute: 0.1 K/uL (ref 0.0–0.5)
Eosinophils Relative: 2 %
HCT: 37 % — ABNORMAL LOW (ref 39.0–52.0)
Hemoglobin: 12.4 g/dL — ABNORMAL LOW (ref 13.0–17.0)
Immature Granulocytes: 0 %
Lymphocytes Relative: 18 %
Lymphs Abs: 1.2 K/uL (ref 0.7–4.0)
MCH: 32.2 pg (ref 26.0–34.0)
MCHC: 33.5 g/dL (ref 30.0–36.0)
MCV: 96.1 fL (ref 80.0–100.0)
Monocytes Absolute: 0.5 K/uL (ref 0.1–1.0)
Monocytes Relative: 7 %
Neutro Abs: 5 K/uL (ref 1.7–7.7)
Neutrophils Relative %: 73 %
Platelet Count: 168 K/uL (ref 150–400)
RBC: 3.85 MIL/uL — ABNORMAL LOW (ref 4.22–5.81)
RDW: 13.2 % (ref 11.5–15.5)
WBC Count: 6.9 K/uL (ref 4.0–10.5)
nRBC: 0 % (ref 0.0–0.2)

## 2024-11-26 LAB — CMP (CANCER CENTER ONLY)
ALT: 20 U/L (ref 0–44)
AST: 22 U/L (ref 15–41)
Albumin: 4.1 g/dL (ref 3.5–5.0)
Alkaline Phosphatase: 122 U/L (ref 38–126)
Anion gap: 8 (ref 5–15)
BUN: 31 mg/dL — ABNORMAL HIGH (ref 8–23)
CO2: 26 mmol/L (ref 22–32)
Calcium: 9.6 mg/dL (ref 8.9–10.3)
Chloride: 106 mmol/L (ref 98–111)
Creatinine: 1.83 mg/dL — ABNORMAL HIGH (ref 0.61–1.24)
GFR, Estimated: 35 mL/min — ABNORMAL LOW
Glucose, Bld: 109 mg/dL — ABNORMAL HIGH (ref 70–99)
Potassium: 4.7 mmol/L (ref 3.5–5.1)
Sodium: 141 mmol/L (ref 135–145)
Total Bilirubin: 0.5 mg/dL (ref 0.0–1.2)
Total Protein: 6.9 g/dL (ref 6.5–8.1)

## 2024-11-26 LAB — TSH: TSH: 2.69 u[IU]/mL (ref 0.350–4.500)

## 2024-11-26 LAB — VITAMIN B12: Vitamin B-12: 1198 pg/mL — ABNORMAL HIGH (ref 180–914)

## 2024-11-26 NOTE — Progress Notes (Signed)
 " Hematology and Oncology Follow Up Visit  Alex Dunn [id number] 1936-03-10 89 y.o. 11/26/2024   Principle Diagnosis:  IgA kappa MGUS Thromboembolic disease of left peroneal vein    Current Therapy:        Xarelto  20 mg p.o. daily - 08/10/2024   Interim History:  Alex Dunn is here today for follow-up. He is here with his wife.  We did do a ultrasound of his left leg today.  This was to evaluate the peroneal vein.  There was noted to be a partially occlusive left peroneal vein thrombus.  He has had this before.  Looks like it may be a little bit better.  He is being worked up for the possibility of lupus.  This is certainly a possibility.  He is going to see Rheumatology next week.  When we last saw him, we did monoclonal studies on him.  There was no monoclonal spike in his blood.  His IgA level was 430 mg/dL.  His kappa light chain was 5.2 mg/dL.  He has had a bone marrow biopsy done which was unremarkable.  He has had a 24-hour urine that was done.  This did show a monoclonal spike that was IgA kappa.  He has had a decent appetite.  He has had no problems over the Holiday season.  He has had no bruising.  He has had no bleeding.  Has been no obvious change in bowel or bladder habits.  He does have the neuropathy.  It is hard to say if this neuropathy might be related to his monoclonal myopathy.  Overall, I would say that his performance status is probably ECOG 2.     Wt Readings from Last 3 Encounters:  11/26/24 182 lb (82.6 kg)  09/27/24 181 lb (82.1 kg)  09/07/24 180 lb 3.2 oz (81.7 kg)     Medications:  Allergies as of 11/26/2024       Reactions   Amitriptyline Other (See Comments)   SLEPT FOR 18-19 HOURS STRAIGHT   Codeine Phosphate Other (See Comments)   Seen odd things crawling up wall   Pregabalin Other (See Comments)   Drowsy, nightmares, stumbling   Tamsulosin  Other (See Comments)   Faint feeling   Tape Other (See Comments)   Skin irritation    Lexapro  [escitalopram  Oxalate] Other (See Comments)   shaking        Medication List        Accurate as of November 26, 2024  3:49 PM. If you have any questions, ask your nurse or doctor.          acetaminophen  325 MG tablet Commonly known as: TYLENOL  Take 650 mg by mouth 4 (four) times daily as needed for mild pain or moderate pain (pain).   aspirin  EC 81 MG tablet Take 81 mg by mouth daily. Swallow whole.   atorvastatin  20 MG tablet Commonly known as: LIPITOR Take 1 tablet (20 mg total) by mouth daily.   ciclopirox  0.77 % cream Commonly known as: LOPROX  APPLY TOPICALLY TO THE AFFECTED AREA TWICE DAILY   cyanocobalamin  1000 MCG tablet Take 1,000 mcg by mouth daily.   diltiazem  120 MG 24 hr capsule Commonly known as: CARDIZEM  CD Take 1 capsule (120 mg total) by mouth daily.   fluticasone  50 MCG/ACT nasal spray Commonly known as: FLONASE  SHAKE LIQUID AND USE 2 SPRAYS IN EACH NOSTRIL DAILY   metoprolol  succinate 25 MG 24 hr tablet Commonly known as: TOPROL -XL TAKE 1 TABLET(25 MG) BY MOUTH DAILY  pantoprazole  40 MG tablet Commonly known as: PROTONIX  TAKE 1 TABLET(40 MG) BY MOUTH DAILY AS NEEDED   PreserVision AREDS Caps Take 1 capsule by mouth daily.   rivaroxaban  20 MG Tabs tablet Commonly known as: XARELTO  Take 1 tablet (20 mg total) by mouth daily with supper.   traMADol  50 MG tablet Commonly known as: ULTRAM  Take 1 tablet (50 mg total) by mouth every 12 (twelve) hours as needed for moderate pain (pain score 4-6).   Vitamin D  50 MCG (2000 UT) Caps Take 2,000 Units by mouth daily at 6 (six) AM.        Allergies:  Allergies  Allergen Reactions   Amitriptyline Other (See Comments)    SLEPT FOR 18-19 HOURS STRAIGHT   Codeine Phosphate Other (See Comments)    Seen odd things crawling up wall   Pregabalin Other (See Comments)    Drowsy, nightmares, stumbling   Tamsulosin  Other (See Comments)    Faint feeling    Tape Other (See Comments)     Skin irritation   Lexapro  [Escitalopram  Oxalate] Other (See Comments)    shaking    Past Medical History, Surgical history, Social history, and Family History were reviewed and updated.  Review of Systems: Review of Systems  Constitutional: Negative.   HENT: Negative.    Eyes: Negative.   Respiratory: Negative.    Cardiovascular: Negative.   Gastrointestinal: Negative.   Genitourinary: Negative.   Musculoskeletal: Negative.   Skin: Negative.   Neurological:  Positive for tingling.  Endo/Heme/Allergies: Negative.   Psychiatric/Behavioral: Negative.        Physical Exam:  Vital signs show temperature of 98.9.  Pulse 70.  Blood pressure 150/67.  Weight is 182 pounds.      Wt Readings from Last 3 Encounters:  11/26/24 182 lb (82.6 kg)  09/27/24 181 lb (82.1 kg)  09/07/24 180 lb 3.2 oz (81.7 kg)   Physical Exam Vitals reviewed.  HENT:     Head: Normocephalic and atraumatic.  Eyes:     Pupils: Pupils are equal, round, and reactive to light.  Cardiovascular:     Rate and Rhythm: Normal rate and regular rhythm.     Heart sounds: Normal heart sounds.  Pulmonary:     Effort: Pulmonary effort is normal.     Breath sounds: Normal breath sounds.  Abdominal:     General: Bowel sounds are normal.     Palpations: Abdomen is soft.  Musculoskeletal:        General: No tenderness or deformity. Normal range of motion.     Cervical back: Normal range of motion.  Lymphadenopathy:     Cervical: No cervical adenopathy.  Skin:    General: Skin is warm and dry.     Findings: No erythema or rash.  Neurological:     Mental Status: He is alert and oriented to person, place, and time.  Psychiatric:        Behavior: Behavior normal.        Thought Content: Thought content normal.        Judgment: Judgment normal.      Lab Results  Component Value Date   WBC 6.9 11/26/2024   HGB 12.4 (L) 11/26/2024   HCT 37.0 (L) 11/26/2024   MCV 96.1 11/26/2024   PLT 168 11/26/2024   Lab  Results  Component Value Date   FERRITIN 109 08/16/2024   IRON 96 08/16/2024   TIBC 286 08/16/2024   UIBC 190 08/16/2024   IRONPCTSAT 34 08/16/2024  Lab Results  Component Value Date   RETICCTPCT 0.8 08/16/2024   RBC 3.85 (L) 11/26/2024   Lab Results  Component Value Date   KPAFRELGTCHN 52.0 (H) 09/27/2024   LAMBDASER 27.4 (H) 09/27/2024   KAPLAMBRATIO 1.90 (H) 09/27/2024   Lab Results  Component Value Date   IGGSERUM 940 09/27/2024   IGA 430 09/27/2024   IGMSERUM 48 09/27/2024   Lab Results  Component Value Date   TOTALPROTELP 6.0 09/27/2024   ALBUMINELP 3.0 09/27/2024   A1GS 0.2 09/27/2024   A2GS 0.8 09/27/2024   BETS 1.1 09/27/2024   BETA2SER 0.5 06/28/2024   GAMS 0.8 09/27/2024   MSPIKE Not Observed 09/27/2024   SPEI Comment 07/14/2024     Chemistry      Component Value Date/Time   NA 141 11/26/2024 1500   NA 142 11/10/2024 0919   K 4.7 11/26/2024 1500   CL 106 11/26/2024 1500   CO2 26 11/26/2024 1500   BUN 31 (H) 11/26/2024 1500   BUN 29 (H) 11/10/2024 0919   CREATININE 1.83 (H) 11/26/2024 1500   CREATININE 1.61 (H) 02/26/2019 1511      Component Value Date/Time   CALCIUM  9.6 11/26/2024 1500   ALKPHOS 122 11/26/2024 1500   AST 22 11/26/2024 1500   ALT 20 11/26/2024 1500   BILITOT 0.5 11/26/2024 1500       Impression and Plan: Mr. Slatten is a very pleasant 89 y.o. caucasian gentleman with IgA kappa MGUS.   Again, I had believe that he does have an underlying collagen vascular disease.  Again is he will see Rheumatology next week..  We did do the bone marrow biopsy on him back in November.  This visit showed normal bone marrow with only 5% plasma cells.  We will continue to follow him along though.  I we will have to keep him on the Xarelto  for right now.  I will keep him on full dose Xarelto  and then we will see him back we might be able to decrease to 10 mg p.o. daily.  I would like to also get another 24-hour urine on him when we see him  back.  I just wanted to try to make his body of life better.  He definitely deserves that.  As always, it is incredibly fun talking to he and his wife.   Maude JONELLE Crease, MD 1/23/20263:49 PM "

## 2024-11-27 LAB — IGG, IGA, IGM
IgA: 428 mg/dL (ref 61–437)
IgG (Immunoglobin G), Serum: 942 mg/dL (ref 603–1613)
IgM (Immunoglobulin M), Srm: 53 mg/dL (ref 15–143)

## 2024-11-29 ENCOUNTER — Ambulatory Visit: Admitting: Gastroenterology

## 2024-11-30 LAB — KAPPA/LAMBDA LIGHT CHAINS
Kappa free light chain: 51 mg/L — ABNORMAL HIGH (ref 3.3–19.4)
Kappa, lambda light chain ratio: 1.85 — ABNORMAL HIGH (ref 0.26–1.65)
Lambda free light chains: 27.6 mg/L — ABNORMAL HIGH (ref 5.7–26.3)

## 2024-12-01 ENCOUNTER — Other Ambulatory Visit (HOSPITAL_BASED_OUTPATIENT_CLINIC_OR_DEPARTMENT_OTHER): Payer: Self-pay

## 2024-12-01 ENCOUNTER — Other Ambulatory Visit: Payer: Self-pay

## 2024-12-01 LAB — PROTEIN ELECTROPHORESIS, SERUM, WITH REFLEX
A/G Ratio: 1.2 (ref 0.7–1.7)
Albumin ELP: 3.6 g/dL (ref 2.9–4.4)
Alpha-1-Globulin: 0.3 g/dL (ref 0.0–0.4)
Alpha-2-Globulin: 0.8 g/dL (ref 0.4–1.0)
Beta Globulin: 1.2 g/dL (ref 0.7–1.3)
Gamma Globulin: 0.8 g/dL (ref 0.4–1.8)
Globulin, Total: 3.1 g/dL (ref 2.2–3.9)
Total Protein ELP: 6.7 g/dL (ref 6.0–8.5)

## 2024-12-02 ENCOUNTER — Other Ambulatory Visit (HOSPITAL_BASED_OUTPATIENT_CLINIC_OR_DEPARTMENT_OTHER): Payer: Self-pay

## 2024-12-02 NOTE — Progress Notes (Unsigned)
 "  Office Visit Note  Patient: Alex Dunn             Date of Birth: 16-Sep-1936           MRN: [id number]             PCP: Frann Mabel Mt, DO Referring: Frann Mabel Mt, DO Visit Date: 12/13/2024 Occupation: Data Unavailable  Subjective:  No chief complaint on file.   History of Present Illness: Alex Dunn is a 89 y.o. male with positive ANA, mildly positive dsDNA, fatigue and unexplained weight loss who is presenting for a 3 month follow up. He was last seen on 09/07/2024 where it was discussed if hematologic work-up is negative, can consider trial of HCQ or fatigue given mild positive dsDNA abs.     Activities of Daily Living:  Patient reports morning stiffness for *** {minute/hour:19697}.   Patient {ACTIONS;DENIES/REPORTS:21021675::Denies} nocturnal pain.  Difficulty dressing/grooming: {ACTIONS;DENIES/REPORTS:21021675::Denies} Difficulty climbing stairs: {ACTIONS;DENIES/REPORTS:21021675::Denies} Difficulty getting out of chair: {ACTIONS;DENIES/REPORTS:21021675::Denies} Difficulty using hands for taps, buttons, cutlery, and/or writing: {ACTIONS;DENIES/REPORTS:21021675::Denies}  No Rheumatology ROS completed.   PMFS History:  Patient Active Problem List   Diagnosis Date Noted   TIA (transient ischemic attack) 03/11/2023   Vertigo 03/09/2023   Dyslipidemia 03/09/2023   Orthostatic hypotension 03/09/2023   DNR (do not resuscitate) 03/09/2023   Cerebellar ataxia in diseases classified elsewhere (HCC) 12/07/2021   CIDP (chronic inflammatory demyelinating polyneuropathy) (HCC) 12/07/2021   History of CVA (cerebrovascular accident) 09/14/2021   Neuropathic pain 06/05/2021   PVC's (premature ventricular contractions) 03/01/2021   Polycystic kidney disease    MGUS (monoclonal gammopathy of unknown significance)    Diverticulosis    Stage 3b chronic kidney disease (HCC) 06/21/2020   B12 deficiency 06/13/2020   Ataxia 02/14/2020   Bilateral  leg pain 02/14/2020   Weakness of both lower extremities 01/10/2020   Balance problem 01/10/2020   Nonsustained ventricular tachycardia (HCC) 11/03/2019   Lumbar radiculopathy 05/06/2019   Lumbosacral spondylosis without myelopathy 03/10/2019   Low back pain 03/09/2019   Monoclonal gammopathy of unknown significance (MGUS) 08/24/2018   Primary osteoarthritis of both knees 06/30/2018   Degeneration of lumbar intervertebral disc 06/30/2018   Essential hypertension 04/15/2018   Diverticular disease 04/15/2018   Abdominal aortic atherosclerosis 04/15/2018   Elevated diaphragm 04/15/2018   Multiple renal cysts 04/06/2018   Night sweats 03/13/2018   Weakness 03/13/2018   Chronic kidney disease 10/18/2016   ANEMIA-IRON DEFICIENCY 06/23/2007   Gastro-esophageal reflux disease without esophagitis 06/23/2007   BPH (benign prostatic hyperplasia) 06/23/2007   GERD 06/23/2007    Past Medical History:  Diagnosis Date   Abdominal aortic atherosclerosis 04/15/2018   ANEMIA-IRON DEFICIENCY 06/23/2007   B12 deficiency 06/13/2020   BPH (benign prostatic hyperplasia) 06/23/2007   Qualifier: Diagnosis of  By: Nunzio, RN, Dagoberto Caldron   Formatting of this note might be different from the original. Formatting of this note might be different from the original. Qualifier: Diagnosis of  By: Nunzio, RN, Dagoberto Caldron   Degeneration of lumbar intervertebral disc 06/30/2018   Diverticulosis    DVT (deep venous thrombosis) (HCC)    Left lower calf.   Elevated diaphragm 04/15/2018   Essential hypertension 04/15/2018   Gastro-esophageal reflux disease without esophagitis 06/23/2007   Qualifier: Diagnosis of  By: Nunzio, RN, Dagoberto Caldron   Formatting of this note might be different from the original. Formatting of this note might be different from the original. Qualifier: Diagnosis of  By: Nunzio, RN, Dagoberto Caldron  Hernia, inguinal    Lumbar radiculopathy 05/06/2019   Lumbosacral spondylosis without myelopathy 03/10/2019    Monoclonal gammopathy of unknown significance (MGUS) 08/24/2018   Multiple renal cysts 04/06/2018   Nonsustained ventricular tachycardia (HCC) 11/03/2019   Primary osteoarthritis of both knees 06/30/2018   Bilateral moderate with moderate chondromalacia patella Formatting of this note might be different from the original. Formatting of this note might be different from the original. Bilateral moderate with moderate chondromalacia patella   Stage 3b chronic kidney disease (HCC) 06/21/2020    Family History  Problem Relation Age of Onset   Heart attack Father    Early death Father    Fibromyalgia Sister    Heart attack Brother        leaky valve   Endometriosis Daughter    Migraines Daughter    Neuromuscular disorder Neg Hx    Past Surgical History:  Procedure Laterality Date   HAND SURGERY Right    LUNG SURGERY     for collapsed lung   SPINAL CORD STIMULATOR IMPLANT  01/24/2023   Social History[1] Social History   Social History Narrative   Lives at home with his wife   Retired   Right handed   Caffeine: about 2 cups daily     Immunization History  Administered Date(s) Administered   Fluad Quad(high Dose 65+) 07/28/2019, 09/07/2020, 08/24/2021, 08/13/2022   Fluad Trivalent(High Dose 65+) 07/28/2023   INFLUENZA, HIGH DOSE SEASONAL PF 09/02/2013, 08/21/2016, 08/27/2017, 09/10/2018, 08/18/2024   Influenza Split 08/30/2011, 09/01/2012   Influenza Whole 09/03/2007, 08/11/2008, 09/13/2009, 08/24/2010   Influenza,inj,Quad PF,6+ Mos 09/05/2014, 08/01/2015   Influenza-Unspecified 08/30/2011, 09/01/2012, 09/02/2013, 09/05/2014, 08/01/2015, 08/21/2016, 08/27/2017, 08/20/2018, 09/10/2018   PFIZER Comirnaty(Gray Top)Covid-19 Tri-Sucrose Vaccine 06/18/2021   PFIZER(Purple Top)SARS-COV-2 Vaccination 11/16/2019, 12/04/2019, 07/11/2020   PPD Test 03/09/2018   Pneumococcal Conjugate-13 09/05/2014   Pneumococcal Polysaccharide-23 11/09/2009   Tdap 08/17/2003   Tetanus 09/02/2013    Zoster Recombinant(Shingrix) 01/20/2015   Zoster, Live 07/31/2014, 01/20/2015     Objective: Vital Signs: There were no vitals taken for this visit.   Physical Exam   Musculoskeletal Exam: ***  CDAI Exam: CDAI Score: -- Patient Global: --; Provider Global: -- Swollen: --; Tender: -- Joint Exam 12/13/2024   No joint exam has been documented for this visit   There is currently no information documented on the homunculus. Go to the Rheumatology activity and complete the homunculus joint exam.  Investigation: No additional findings.  Imaging: US  Venous Img Lower Unilateral Left (DVT) Result Date: 11/26/2024 EXAM: ULTRASOUND DUPLEX OF THE LEFT LOWER EXTREMITY VEINS TECHNIQUE: Duplex ultrasound using B-mode/gray scaled imaging and Doppler spectral analysis and color flow was obtained of the deep venous structures of the left lower extremity. COMPARISON: 08/09/2024 by report only. CLINICAL HISTORY: Evaluate for any resolution of blood clot in left leg. FINDINGS: The common femoral vein, femoral vein, popliteal vein, and posterior tibial vein demonstrate normal compressibility with normal color flow and spectral analysis. There is incomplete compressibility of the left peroneal vein, containing eccentric hypoechoic thrombus, with some continued antegrade color flow signal on color Doppler. Other visualized deep veins in the left lower extremity demonstrate normal compressibility and Doppler flow. Mild, minimally complex 4.3 x 1 x 1.8 cm fluid collection in the left posterior popliteal fossa. There is subcutaneous edema in the left calf. Limited images of the right common femoral vein are normal. IMPRESSION: 1. Partially occlusive isolated calf (left peroneal) DVT. 2. Small left baker cyst. Electronically signed by: Katheleen Faes MD 11/26/2024  02:50 PM EST RP Workstation: [id number]    Recent Labs: Lab Results  Component Value Date   WBC 6.9 11/26/2024   HGB 12.4 (L) 11/26/2024   PLT 168  11/26/2024   NA 141 11/26/2024   K 4.7 11/26/2024   CL 106 11/26/2024   CO2 26 11/26/2024   GLUCOSE 109 (H) 11/26/2024   BUN 31 (H) 11/26/2024   CREATININE 1.83 (H) 11/26/2024   BILITOT 0.5 11/26/2024   ALKPHOS 122 11/26/2024   AST 22 11/26/2024   ALT 20 11/26/2024   PROT 6.9 11/26/2024   ALBUMIN 4.1 11/26/2024   CALCIUM  9.6 11/26/2024   GFRAA 48 (L) 04/27/2020   QFTBGOLDPLUS NEGATIVE 06/15/2018    Speciality Comments: No specialty comments available.  Procedures:  No procedures performed Allergies: Amitriptyline, Codeine phosphate, Pregabalin, Tamsulosin , Tape, and Lexapro  [escitalopram  oxalate]   Assessment / Plan:     Visit Diagnoses: No diagnosis found.  Orders: No orders of the defined types were placed in this encounter.  No orders of the defined types were placed in this encounter.   Face-to-face time spent with patient was *** minutes. Greater than 50% of time was spent in counseling and coordination of care.  Follow-Up Instructions: No follow-ups on file.   Alfonso Patterson, LPN  Note - This record has been created using [employer].  Chart creation errors have been sought, but may not always  have been located. Such creation errors do not reflect on  the standard of medical care.    [1]  Social History Tobacco Use   Smoking status: Never    Passive exposure: Never   Smokeless tobacco: Never  Vaping Use   Vaping status: Never Used  Substance Use Topics   Alcohol use: Never   Drug use: Never   "

## 2024-12-06 ENCOUNTER — Ambulatory Visit: Admitting: Physician Assistant

## 2024-12-13 ENCOUNTER — Ambulatory Visit

## 2024-12-13 DIAGNOSIS — R7689 Other specified abnormal immunological findings in serum: Secondary | ICD-10-CM

## 2024-12-13 DIAGNOSIS — R634 Abnormal weight loss: Secondary | ICD-10-CM

## 2024-12-13 DIAGNOSIS — R5383 Other fatigue: Secondary | ICD-10-CM

## 2025-01-21 ENCOUNTER — Inpatient Hospital Stay: Admitting: Hematology & Oncology

## 2025-01-21 ENCOUNTER — Inpatient Hospital Stay

## 2025-02-14 ENCOUNTER — Ambulatory Visit: Admitting: Family Medicine
